# Patient Record
Sex: Female | Born: 1945
Health system: Southern US, Community
[De-identification: ages and names within clinical notes are randomized; demographics above are authoritative.]

## PROBLEM LIST (undated history)

## (undated) DIAGNOSIS — F32A Depression, unspecified: Secondary | ICD-10-CM

## (undated) DIAGNOSIS — I10 Essential (primary) hypertension: Secondary | ICD-10-CM

## (undated) DIAGNOSIS — D496 Neoplasm of unspecified behavior of brain: Secondary | ICD-10-CM

## (undated) DIAGNOSIS — M1990 Unspecified osteoarthritis, unspecified site: Secondary | ICD-10-CM

## (undated) DIAGNOSIS — E785 Hyperlipidemia, unspecified: Secondary | ICD-10-CM

## (undated) DIAGNOSIS — R569 Unspecified convulsions: Secondary | ICD-10-CM

## (undated) DIAGNOSIS — R0600 Dyspnea, unspecified: Secondary | ICD-10-CM

## (undated) HISTORY — DX: Hyperlipidemia, unspecified: E78.5

## (undated) HISTORY — DX: Essential (primary) hypertension: I10

## (undated) HISTORY — PX: OOPHORECTOMY: SHX86

## (undated) HISTORY — DX: Unspecified osteoarthritis, unspecified site: M19.90

## (undated) HISTORY — PX: EYE SURGERY: SHX253

---

## 1980-09-12 HISTORY — PX: APPENDECTOMY: SHX54

## 2019-10-28 ENCOUNTER — Ambulatory Visit (INDEPENDENT_AMBULATORY_CARE_PROVIDER_SITE_OTHER): Payer: Medicare HMO | Admitting: Family Medicine

## 2019-10-28 ENCOUNTER — Other Ambulatory Visit: Payer: Self-pay

## 2019-10-28 ENCOUNTER — Encounter: Payer: Self-pay | Admitting: Family Medicine

## 2019-10-28 VITALS — BP 112/76 | HR 102 | Temp 98.2°F | Ht 63.75 in | Wt 162.8 lb

## 2019-10-28 DIAGNOSIS — M25511 Pain in right shoulder: Secondary | ICD-10-CM

## 2019-10-28 DIAGNOSIS — F329 Major depressive disorder, single episode, unspecified: Secondary | ICD-10-CM | POA: Diagnosis not present

## 2019-10-28 DIAGNOSIS — G8929 Other chronic pain: Secondary | ICD-10-CM | POA: Insufficient documentation

## 2019-10-28 DIAGNOSIS — E785 Hyperlipidemia, unspecified: Secondary | ICD-10-CM | POA: Insufficient documentation

## 2019-10-28 DIAGNOSIS — F325 Major depressive disorder, single episode, in full remission: Secondary | ICD-10-CM | POA: Insufficient documentation

## 2019-10-28 DIAGNOSIS — F32A Depression, unspecified: Secondary | ICD-10-CM

## 2019-10-28 DIAGNOSIS — I10 Essential (primary) hypertension: Secondary | ICD-10-CM

## 2019-10-28 DIAGNOSIS — M25512 Pain in left shoulder: Secondary | ICD-10-CM

## 2019-10-28 MED ORDER — LISINOPRIL-HYDROCHLOROTHIAZIDE 10-12.5 MG PO TABS: 1.0000 | ORAL_TABLET | Freq: Every day | ORAL | 3 refills | Status: DC

## 2019-10-28 MED ORDER — CITALOPRAM HYDROBROMIDE 20 MG PO TABS: 20.0000 mg | ORAL_TABLET | Freq: Every day | ORAL | 3 refills | Status: DC

## 2019-10-28 MED ORDER — MELOXICAM 7.5 MG PO TABS: 7.5000 mg | ORAL_TABLET | Freq: Every day | ORAL | 0 refills | Status: DC

## 2019-10-28 MED ORDER — ATORVASTATIN CALCIUM 20 MG PO TABS: 20.0000 mg | ORAL_TABLET | Freq: Every day | ORAL | 3 refills | Status: DC

## 2019-10-28 NOTE — Progress Notes (Signed)
Subjective:    Patient ID: Marie Hensley, female    DOB: Oct 03, 1945, 74 y.o.   MRN: [id number]  HPI Chief Complaint  Patient presents with  . New Patient (Initial Visit)    Establish Care   This is a 74 yo female who presents today to establish care. She recently moved here from [place], [place]. Originally from [place]. Moved to be closer to her daughter. Retired then went back to work. Enjoys gardening. Has 7 cats.   Last CPE- about 1 year ago Mammo- unsure, many years ago Pap- aged out of screening Colonoscopy- never had Tdap- unsure Flu- annual Eye- 10/16/2019 Dental- regular Exercise- household chores, walking, gardening  Depression- ran out of citalopram, increased crying, more moody. Increased stress with move. Feels that she will be back to baseline once she restarts her medication.   Shoulder and back pain- takes meloxicam. Achy. Also takes acetaminophen prn.   Diarrhea- takes antidiarrheal once a day prn. No blood or mucus in bowel movements.   Review of Systems No chest pain or SOB. No urinary frequency, burning. Occasional stress incontinence.     Objective:   Physical Exam Vitals reviewed. Nursing note reviewed: appears younger than stated age.  Constitutional:      General: She is not in acute distress.    Appearance: Normal appearance. She is normal weight. She is not ill-appearing, toxic-appearing or diaphoretic.  HENT:     Right Ear: External ear normal.     Left Ear: External ear normal.  Cardiovascular:     Rate and Rhythm: Normal rate and regular rhythm.     Heart sounds: Normal heart sounds.  Pulmonary:     Effort: Pulmonary effort is normal.     Breath sounds: Normal breath sounds.  Musculoskeletal:     Right lower leg: No edema.     Left lower leg: No edema.  Skin:    General: Skin is warm and dry.  Neurological:     Mental Status: She is alert and oriented to person, place, and time.  Psychiatric:        Mood and Affect: Mood normal.          Behavior: Behavior normal.        Thought Content: Thought content normal.        Judgment: Judgment normal.       BP 112/76 (BP Location: Left Arm, Patient Position: Sitting, Cuff Size: Normal)   Pulse (!) 102   Temp 98.2 F (36.8 C) (Temporal)   Ht 5' 3.75" (1.619 m)   Wt 162 lb 12.8 oz (73.8 kg)   SpO2 97%   BMI 28.16 kg/m      Assessment & Plan:  1. Essential hypertension - normal BP reading today - lisinopril-hydrochlorothiazide (ZESTORETIC) 10-12.5 MG tablet; Take 1 tablet by mouth daily.  Dispense: 90 tablet; Refill: 3  2. Hyperlipidemia, unspecified hyperlipidemia type - atorvastatin (LIPITOR) 20 MG tablet; Take 1 tablet (20 mg total) by mouth daily.  Dispense: 90 tablet; Refill: 3  3. Chronic pain of both shoulders - meloxicam (MOBIC) 7.5 MG tablet; Take 1 tablet (7.5 mg total) by mouth daily.  Dispense: 90 tablet; Refill: 0  4. Depression, unspecified depression type - discussed restarting her citalopram. If depression symptoms don't improve, she was encouraged to follow up - citalopram (CELEXA) 20 MG tablet; Take 1 tablet (20 mg total) by mouth daily.  Dispense: 90 tablet; Refill: 3  - follow up in 6 months  This visit occurred during  the SARS-CoV-2 public health emergency.  Safety protocols were in place, including screening questions prior to the visit, additional usage of staff PPE, and extensive cleaning of exam room while observing appropriate contact time as indicated for disinfecting solutions.   Clarene Reamer, FNP-BC  [facility]  10/28/2019 12:52 PM

## 2019-10-28 NOTE — Patient Instructions (Addendum)
COVID-19 Vaccine Information can be found at: [email or web address].uk For questions related to vaccine distribution or appointments, please email [email or web address] or call [phone].   Good to see you today  Please follow up in 6 months for labs/ complete physical exam, medicare annual wellness visit with Wellness Nurse

## 2020-02-13 ENCOUNTER — Other Ambulatory Visit: Payer: Self-pay | Admitting: Family Medicine

## 2020-02-13 DIAGNOSIS — M25511 Pain in right shoulder: Secondary | ICD-10-CM

## 2020-02-13 DIAGNOSIS — G8929 Other chronic pain: Secondary | ICD-10-CM

## 2020-02-14 NOTE — Telephone Encounter (Signed)
Last OV and refill 10/28/19 meloxicam (MOBIC) 7.5 MG tablet; Take 1 tablet (7.5 mg total) by mouth daily.  Dispense: 90 tablet; Refill: 0   Upcoming OV 04/27/20

## 2020-03-19 ENCOUNTER — Telehealth: Payer: Self-pay | Admitting: Family Medicine

## 2020-03-19 NOTE — Telephone Encounter (Signed)
Will you please call the patient and find out the following- does she have an ophthalmologist that she wants to be referred to? If not, please find out if she wants to go to [place] or [place]? Who has diagnosed her cataracts and can we get records?

## 2020-03-19 NOTE — Telephone Encounter (Signed)
Pt needs a referral to optometrist to get a cataract removed.

## 2020-03-26 ENCOUNTER — Other Ambulatory Visit: Payer: Self-pay | Admitting: Family Medicine

## 2020-03-26 DIAGNOSIS — H259 Unspecified age-related cataract: Secondary | ICD-10-CM

## 2020-03-26 NOTE — Telephone Encounter (Signed)
LM requesting a call back

## 2020-03-26 NOTE — Telephone Encounter (Signed)
Pt called back. She had a ophthalmologist in [place] who dx cataracts a year ago. She has moved up here since. She is asking for a referral to Dar[facility] Crocker [phone].

## 2020-03-26 NOTE — Telephone Encounter (Signed)
Referral placed.

## 2020-03-30 NOTE — Telephone Encounter (Signed)
Pt aware that the referral has been placed.  Nothing further needed.

## 2020-03-31 ENCOUNTER — Telehealth: Payer: Self-pay

## 2020-03-31 NOTE — Telephone Encounter (Signed)
Noted  

## 2020-03-31 NOTE — Telephone Encounter (Signed)
03-26-20 Dermatology Referral closed.  Pt aware.  Received fax from Dr. Talbert Forest office.  They are unable to accept a referral from PCP for this.  Referral needs to be from [place] spoke w/her insurance, [employer].  They have given her a few optometrist's names to call.  Pt will have her optometrist in [place] refer her.

## 2020-04-17 ENCOUNTER — Other Ambulatory Visit: Payer: Self-pay | Admitting: Family Medicine

## 2020-04-17 DIAGNOSIS — E785 Hyperlipidemia, unspecified: Secondary | ICD-10-CM

## 2020-04-17 DIAGNOSIS — I10 Essential (primary) hypertension: Secondary | ICD-10-CM

## 2020-04-17 DIAGNOSIS — F32A Depression, unspecified: Secondary | ICD-10-CM

## 2020-04-20 ENCOUNTER — Ambulatory Visit: Payer: 59

## 2020-04-20 ENCOUNTER — Other Ambulatory Visit (INDEPENDENT_AMBULATORY_CARE_PROVIDER_SITE_OTHER): Payer: Medicare HMO

## 2020-04-20 ENCOUNTER — Other Ambulatory Visit: Payer: Self-pay

## 2020-04-20 DIAGNOSIS — F329 Major depressive disorder, single episode, unspecified: Secondary | ICD-10-CM | POA: Diagnosis not present

## 2020-04-20 DIAGNOSIS — E785 Hyperlipidemia, unspecified: Secondary | ICD-10-CM

## 2020-04-20 DIAGNOSIS — F32A Depression, unspecified: Secondary | ICD-10-CM

## 2020-04-20 DIAGNOSIS — I10 Essential (primary) hypertension: Secondary | ICD-10-CM | POA: Diagnosis not present

## 2020-04-20 DIAGNOSIS — E559 Vitamin D deficiency, unspecified: Secondary | ICD-10-CM

## 2020-04-20 LAB — COMPREHENSIVE METABOLIC PANEL
ALT: 9 U/L (ref 0–35)
AST: 15 U/L (ref 0–37)
Albumin: 4.4 g/dL (ref 3.5–5.2)
Alkaline Phosphatase: 43 U/L (ref 39–117)
BUN: 42 mg/dL — ABNORMAL HIGH (ref 6–23)
CO2: 22 mEq/L (ref 19–32)
Calcium: 9.5 mg/dL (ref 8.4–10.5)
Chloride: 105 mEq/L (ref 96–112)
Creatinine, Ser: 1.8 mg/dL — ABNORMAL HIGH (ref 0.40–1.20)
GFR: 27.38 mL/min — ABNORMAL LOW (ref >60.00)
Glucose, Bld: 114 mg/dL — ABNORMAL HIGH (ref 70–99)
Potassium: 4 mEq/L (ref 3.5–5.1)
Sodium: 137 mEq/L (ref 135–145)
Total Bilirubin: 0.4 mg/dL (ref 0.2–1.2)
Total Protein: 7.4 g/dL (ref 6.0–8.3)

## 2020-04-20 LAB — CBC WITH DIFFERENTIAL/PLATELET
Basophils Absolute: 0 10*3/uL (ref 0.0–0.1)
Basophils Relative: 0.5 % (ref 0.0–3.0)
Eosinophils Absolute: 0.2 10*3/uL (ref 0.0–0.7)
Eosinophils Relative: 3.6 % (ref 0.0–5.0)
HCT: 36.3 % (ref 36.0–46.0)
Hemoglobin: 12.3 g/dL (ref 12.0–15.0)
Lymphocytes Relative: 43.3 % (ref 12.0–46.0)
Lymphs Abs: 2.6 10*3/uL (ref 0.7–4.0)
MCHC: 34 g/dL (ref 30.0–36.0)
MCV: 96.8 fl (ref 78.0–100.0)
Monocytes Absolute: 0.5 10*3/uL (ref 0.1–1.0)
Monocytes Relative: 8 % (ref 3.0–12.0)
Neutro Abs: 2.7 10*3/uL (ref 1.4–7.7)
Neutrophils Relative %: 44.6 % (ref 43.0–77.0)
Platelets: 203 10*3/uL (ref 150.0–400.0)
RBC: 3.75 Mil/uL — ABNORMAL LOW (ref 3.87–5.11)
RDW: 14.2 % (ref 11.5–15.5)
WBC: 6 10*3/uL (ref 4.0–10.5)

## 2020-04-20 LAB — TSH: TSH: 3.47 u[IU]/mL (ref 0.35–4.50)

## 2020-04-20 LAB — LIPID PANEL
Cholesterol: 233 mg/dL — ABNORMAL HIGH (ref 0–200)
HDL: 54.7 mg/dL (ref >39.00)
Total CHOL/HDL Ratio: 4
Triglycerides: 443 mg/dL — ABNORMAL HIGH (ref 0.0–149.0)

## 2020-04-20 LAB — LDL CHOLESTEROL, DIRECT: Direct LDL: 109 mg/dL

## 2020-04-20 LAB — VITAMIN D 25 HYDROXY (VIT D DEFICIENCY, FRACTURES): VITD: 20.18 ng/mL — ABNORMAL LOW (ref 30.00–100.00)

## 2020-04-21 ENCOUNTER — Ambulatory Visit: Payer: Medicare HMO

## 2020-04-21 ENCOUNTER — Telehealth: Payer: Self-pay

## 2020-04-21 NOTE — Telephone Encounter (Signed)
Called patient to complete her Medicare visit. Patient wanted to wait and complete this with the provider at her upcoming physical. The last time this was done she was charged a $35 fee and she has to talk with Neoma Laming about coding it a certain way to make sure this does not happen again. Appointment cancelled per patient request.

## 2020-04-27 ENCOUNTER — Encounter: Payer: Self-pay | Admitting: Family Medicine

## 2020-04-27 ENCOUNTER — Other Ambulatory Visit: Payer: Self-pay

## 2020-04-27 ENCOUNTER — Ambulatory Visit (INDEPENDENT_AMBULATORY_CARE_PROVIDER_SITE_OTHER): Payer: Medicare HMO | Admitting: Family Medicine

## 2020-04-27 VITALS — BP 110/70 | HR 82 | Temp 96.4°F | Ht 63.75 in | Wt 163.5 lb

## 2020-04-27 DIAGNOSIS — E785 Hyperlipidemia, unspecified: Secondary | ICD-10-CM

## 2020-04-27 DIAGNOSIS — N184 Chronic kidney disease, stage 4 (severe): Secondary | ICD-10-CM | POA: Diagnosis not present

## 2020-04-27 DIAGNOSIS — Z0000 Encounter for general adult medical examination without abnormal findings: Secondary | ICD-10-CM

## 2020-04-27 DIAGNOSIS — I10 Essential (primary) hypertension: Secondary | ICD-10-CM

## 2020-04-27 NOTE — Progress Notes (Signed)
Subjective:    Marie Hensley is a 74 y.o. female who presents for Medicare Annual/Subsequent preventive examination.  Preventive Screening-Counseling & Management  Tobacco Social History   Tobacco Use  Smoking Status Former Smoker   Packs/day: 0.50   Years: 20.00   Pack years: 10.00   Types: Cigarettes   Quit date: 10/27/1989   Years since quitting: 30.5  Smokeless Tobacco Never Used  Tobacco Comment   Smoked off and on in 20 year period     Problems Prior to Visit 1.   Current Problems (verified) Patient Active Problem List   Diagnosis Date Noted   Essential hypertension 10/28/2019   Hyperlipidemia 10/28/2019   Chronic pain of both shoulders 10/28/2019   Depression 10/28/2019    Medications Prior to Visit Current Outpatient Medications on File Prior to Visit  Medication Sig Dispense Refill   atorvastatin (LIPITOR) 20 MG tablet Take 1 tablet (20 mg total) by mouth daily. 90 tablet 3   citalopram (CELEXA) 20 MG tablet Take 1 tablet (20 mg total) by mouth daily. 90 tablet 3   lisinopril-hydrochlorothiazide (ZESTORETIC) 10-12.5 MG tablet Take 1 tablet by mouth daily. 90 tablet 3   meloxicam (MOBIC) 7.5 MG tablet TAKE ONE TABLET BY MOUTH DAILY 90 tablet 0   No current facility-administered medications on file prior to visit.    Current Medications (verified) Current Outpatient Medications  Medication Sig Dispense Refill   atorvastatin (LIPITOR) 20 MG tablet Take 1 tablet (20 mg total) by mouth daily. 90 tablet 3   citalopram (CELEXA) 20 MG tablet Take 1 tablet (20 mg total) by mouth daily. 90 tablet 3   lisinopril-hydrochlorothiazide (ZESTORETIC) 10-12.5 MG tablet Take 1 tablet by mouth daily. 90 tablet 3   meloxicam (MOBIC) 7.5 MG tablet TAKE ONE TABLET BY MOUTH DAILY 90 tablet 0   No current facility-administered medications for this visit.     Allergies (verified) Patient has no known allergies.   PAST HISTORY  Family History Family  History  Problem Relation Age of Onset   Addison's disease Daughter     Social History Social History   Tobacco Use   Smoking status: Former Smoker    Packs/day: 0.50    Years: 20.00    Pack years: 10.00    Types: Cigarettes    Quit date: 10/27/1989    Years since quitting: 30.5   Smokeless tobacco: Never Used   Tobacco comment: Smoked off and on in 20 year period  Substance Use Topics   Alcohol use: Yes    Comment: social     Are there smokers in your home (other than you)? No  Risk Factors Current exercise habits: Gym/ health club routine includes low impact aerobics.  Dietary issues discussed: healthy choices, adequate water intake   Cardiac risk factors: advanced age (older than 37 for men, 44 for women), dyslipidemia and hypertension.  Depression Screen (Note: if answer to either of the following is "Yes", a more complete depression screening is indicated)   Over the past two weeks, have you felt down, depressed or hopeless? No  Over the past two weeks, have you felt little interest or pleasure in doing things? No  Have you lost interest or pleasure in daily life? No  Do you often feel hopeless? No  Do you cry easily over simple problems? No  Activities of Daily Living In your present state of health, do you have any difficulty performing the following activities?:  Driving? No Managing money?  No Feeding yourself? No  Getting from bed to chair? NoGeneral appearance: alert, cooperative and appears stated age Ears: normal TM's and external ear canals both ears Nose: Nares normal. Septum midline. Mucosa normal. No drainage or sinus tenderness. Throat: lips, mucosa, and tongue normal; teeth and gums normal Neck: no adenopathy, no carotid bruit, no JVD, supple, symmetrical, trachea midline and thyroid not enlarged, symmetric, no tenderness/mass/nodules Lungs: clear to auscultation bilaterally Breasts: normal appearance, no masses or tenderness Abdomen: soft,  non-tender; bowel sounds normal; no masses,  no organomegaly Extremities: extremities normal, atraumatic, no cyanosis or edema Skin: Skin color, texture, turgor normal. No rashes or lesions Climbing a flight of stairs? No Preparing food and eating?: No Bathing or showering? No Getting dressed: No Getting to the toilet? No Using the toilet:No Moving around from place to place: No In the past year have you fallen or had a near fall?:No   Are you sexually active?  No  Do you have more than one partner?  No  Hearing Difficulties: Yes Do you often ask people to speak up or repeat themselves? Yes Do you experience ringing or noises in your ears? No Do you have difficulty understanding soft or whispered voices? Yes   Do you feel that you have a problem with memory? No  Do you often misplace items? Yes  Do you feel safe at home?  Yes  Cognitive Testing  Alert? Yes  Normal Appearance?Yes  Oriented to person? Yes  Place? Yes   Time? Yes  Recall of three objects?  Yes  Can perform simple calculations? Yes  Displays appropriate judgment?Yes  Can read the correct time from a watch face?Yes   Advanced Directives have been discussed with the patient? Yes  List the Names of Other Physician/Practitioners you currently use: 1.  none  Indicate any recent Medical Services you may have received from other than [facility] providers in the past year (date may be approximate).  Immunization History  Administered Date(s) Administered   Influenza,inj,Quad PF,6+ Mos 07/14/2019   Pneumococcal Conjugate-13 11/12/2015   Pneumococcal Polysaccharide-23 07/20/2017    Screening Tests Health Maintenance  Topic Date Due   Hepatitis C Screening  Never done   COVID-19 Vaccine (1) Never done   TETANUS/TDAP  Never done   COLONOSCOPY  Never done   DEXA SCAN  Never done   INFLUENZA VACCINE  04/12/2020   PNA vac Low Risk Adult  Completed    All answers were reviewed with the patient and necessary  referrals were made:  Elby Beck, FNP   04/27/2020   History reviewed: allergies, current medications, past family history, past medical history, past social history, past surgical history and problem list  Review of Systems A comprehensive review of systems was negative except for: Musculoskeletal: positive for arthralgias and myalgias    Objective:      Body mass index is 28.29 kg/m. BP 110/70    Pulse 82    Temp (!) 96.4 F (35.8 C) (Temporal)    Ht 5' 3.75" (1.619 m)    Wt 163 lb 8 oz (74.2 kg)    SpO2 98%    BMI 28.29 kg/m   Wt Readings from Last 3 Encounters:  04/27/20 163 lb 8 oz (74.2 kg)  10/28/19 162 lb 12.8 oz (73.8 kg)     Hearing Screening   125Hz  250Hz  500Hz  1000Hz  2000Hz  3000Hz  4000Hz  6000Hz  8000Hz   Right ear:   40 40 40  40    Left ear:   40 40 40  40  Visual Acuity Screening   Right eye Left eye Both eyes  Without correction:     With correction: 20/40 20/40 20/25     Physical Exam  Constitutional: She is oriented to person, place, and time. She appears well-developed and well-nourished. No distress.  HENT:  Head: Normocephalic and atraumatic.  Right Ear: External ear normal. TM normal.  Left Ear: External ear normal. TM normal.  Nose: Nose normal.  Mouth/Throat: Oropharynx is clear and moist. No oropharyngeal exudate.  Eyes: Conjunctivae are normal.   Neck: Normal range of motion. Neck supple. No JVD present. No thyromegaly present.  Cardiovascular: Normal rate, regular rhythm, normal heart sounds and intact distal pulses.   Pulmonary/Chest: Effort normal and breath sounds normal. Right breast exhibits no inverted nipple, no mass, no nipple discharge, no skin change and no tenderness. Left breast exhibits no inverted nipple, no mass, no nipple discharge, no skin change and no tenderness. Breasts are symmetrical.  Abdominal: Soft. Bowel sounds are normal. She exhibits no distension and no mass. There is no tenderness. There is no rebound and no  guarding.  Musculoskeletal: Normal range of motion. She exhibits no edema or tenderness.  Lymphadenopathy:    She has no cervical adenopathy.  Neurological: She is alert and oriented to person, place, and time.   Skin: Skin is warm and dry. She is not diaphoretic.  Psychiatric: She has a normal mood and affect. Her behavior is normal. Judgment and thought content normal.  Vitals reviewed.       Assessment:  1. Medicare annual wellness visit, subsequent  2. Essential hypertension BP: 110/70  -Well-controlled on lisinopril hydrochlorothiazide  3. Hyperlipidemia, unspecified hyperlipidemia type -Not well controlled, currently on atorvastatin 20 mg, will have her increase to 40 mg.  And recheck in 6 months  4. Chronic kidney disease, stage 4 (severe) (HCC) -Per patient, she was advised to avoid NSAIDs in the past and was told that her kidney function was decreased and she has seen nephrologist.  I have no records of this.  She has been taking meloxicam 7.5 mg daily for many months now.  Discussed importance of stopping meloxicam, avoiding all other NSAIDs, can use Tylenol for pain.  Increase water.  Recheck labs in 2 weeks.  This visit occurred during the SARS-CoV-2 public health emergency.  Safety protocols were in place, including screening questions prior to the visit, additional usage of staff PPE, and extensive cleaning of exam room while observing appropriate contact time as indicated for disinfecting solutions.      Clarene Reamer, FNP-BC  [facility] [place]  04/28/2020 9:56 AM         Plan:     During the course of the visit the patient was educated and counseled about appropriate screening and preventive services including:    Advanced directives: has NO advanced directive  - add't info requested. Referral to SW: She was provided advanced care planning booklet/ documents, encouraged to complete and return copy to  office       Diet review for nutrition referral? Yes ____  Not Indicated __x__   Patient Instructions (the written plan) was given to the patient.  Medicare Attestation I have personally reviewed: The patient's medical and social history Their use of alcohol, tobacco or illicit drugs Their current medications and supplements The patient's functional ability including ADLs,fall risks, home safety risks, cognitive, and hearing and visual impairment Diet and physical activities Evidence for depression or mood disorders  The  patient's weight, height, BMI, and visual acuity have been recorded in the chart.  I have made referrals, counseling, and provided education to the patient based on review of the above and I have provided the patient with a written personalized care plan for preventive services.     Elby Beck, FNP   04/27/2020

## 2020-04-27 NOTE — Patient Instructions (Addendum)
Vitamin D is low, please start vitamin D3 1,000 IU daily  For assistance with mychart- [phone]  Stop meloxicam, schedule a lab only visit in 2 weeks. Increase your water intake by at least 16 ounces a day  For joint pain- can take acetaminophen (Tylenol) twice a day every 6-8 hours (maximum 4,000 mg in 24 hours)  Follow up lab only visit in 2 weeks. Follow up in office in 6 months.   Get your flu vaccine when it is available   Ms. Marie Hensley , Thank you for taking time to come for your Medicare Wellness Visit. I appreciate your ongoing commitment to your health goals. Please review the following plan we discussed and let me know if I can assist you in the future.   These are the goals we discussed: Goals   Continue silver sneakers, add stretching, increase your water intake     This is a list of the screening recommended for you and due dates:  Health Maintenance  Topic Date Due  .  Hepatitis C: One time screening is recommended by Center for Disease Control  (CDC) for  adults born from [birth year] through 1967.   Never done  . COVID-19 Vaccine (1) Never done  . Tetanus Vaccine  Never done  . Colon Cancer Screening  Never done  . DEXA scan (bone density measurement)  Never done  . Flu Shot  04/12/2020  . Pneumonia vaccines  Completed

## 2020-04-28 ENCOUNTER — Telehealth: Payer: Self-pay | Admitting: Family Medicine

## 2020-04-28 ENCOUNTER — Encounter: Payer: Self-pay | Admitting: Family Medicine

## 2020-04-28 DIAGNOSIS — N184 Chronic kidney disease, stage 4 (severe): Secondary | ICD-10-CM | POA: Insufficient documentation

## 2020-04-28 MED ORDER — ATORVASTATIN CALCIUM 40 MG PO TABS: 40.0000 mg | ORAL_TABLET | Freq: Every day | ORAL | 3 refills | Status: DC

## 2020-04-28 NOTE — Telephone Encounter (Signed)
Please call patient and tell her that I forgot to discuss her elevated cholesterol in the office yesterday.  I would like her to increase her atorvastatin to 40 mg.  She can take 2 of the 20 mg at 1 time until she is out.  I have sent in a refill at the higher dose, atorvastatin 40 mg for her to start when she is out of the 20 mg tablets.  I would also like her to watch her sugars and carbs as we discussed.

## 2020-04-30 NOTE — Telephone Encounter (Signed)
Pt notified as instructed by telephone. Pt states understanding.

## 2020-04-30 NOTE — Telephone Encounter (Signed)
Left message for patient to return call to office. 

## 2020-05-11 ENCOUNTER — Other Ambulatory Visit: Payer: Self-pay

## 2020-05-11 ENCOUNTER — Other Ambulatory Visit: Payer: Self-pay | Admitting: Family Medicine

## 2020-05-11 ENCOUNTER — Other Ambulatory Visit (INDEPENDENT_AMBULATORY_CARE_PROVIDER_SITE_OTHER): Payer: Medicare HMO

## 2020-05-11 DIAGNOSIS — R944 Abnormal results of kidney function studies: Secondary | ICD-10-CM | POA: Diagnosis not present

## 2020-05-11 LAB — BASIC METABOLIC PANEL
BUN: 32 mg/dL — ABNORMAL HIGH (ref 6–23)
CO2: 21 mEq/L (ref 19–32)
Calcium: 9.6 mg/dL (ref 8.4–10.5)
Chloride: 106 mEq/L (ref 96–112)
Creatinine, Ser: 1.41 mg/dL — ABNORMAL HIGH (ref 0.40–1.20)
GFR: 36.29 mL/min — ABNORMAL LOW (ref >60.00)
Glucose, Bld: 129 mg/dL — ABNORMAL HIGH (ref 70–99)
Potassium: 3.8 mEq/L (ref 3.5–5.1)
Sodium: 138 mEq/L (ref 135–145)

## 2020-05-29 ENCOUNTER — Ambulatory Visit (INDEPENDENT_AMBULATORY_CARE_PROVIDER_SITE_OTHER): Payer: Medicare HMO | Admitting: Family Medicine

## 2020-05-29 ENCOUNTER — Other Ambulatory Visit: Payer: Self-pay

## 2020-05-29 ENCOUNTER — Encounter: Payer: Self-pay | Admitting: Family Medicine

## 2020-05-29 VITALS — BP 118/68 | HR 88 | Wt 166.0 lb

## 2020-05-29 DIAGNOSIS — M5441 Lumbago with sciatica, right side: Secondary | ICD-10-CM

## 2020-05-29 DIAGNOSIS — Z23 Encounter for immunization: Secondary | ICD-10-CM

## 2020-05-29 DIAGNOSIS — G8929 Other chronic pain: Secondary | ICD-10-CM | POA: Diagnosis not present

## 2020-05-29 MED ORDER — TRAMADOL HCL 50 MG PO TABS: 50.0000 mg | ORAL_TABLET | Freq: Three times a day (TID) | ORAL | 0 refills | Status: AC | PRN

## 2020-05-29 NOTE — Patient Instructions (Signed)
Good to see you today  Please see if your insurance will cover Chiropractor visits  Work in stretching and strengthening, can continue Tylenol 2 tablets every 8-12 hours, can try topical creams/ patches, heat/ ice  Follow up in 6 weeks for repeat blood work Back Exercises These exercises help to make your trunk and back strong. They also help to keep the lower back flexible. Doing these exercises can help to prevent back pain or lessen existing pain.  If you have back pain, try to do these exercises 2-3 times each day or as told by your doctor.  As you get better, do the exercises once each day. Repeat the exercises more often as told by your doctor.  To stop back pain from coming back, do the exercises once each day, or as told by your doctor. Exercises Single knee to chest Do these steps 3-5 times in a row for each leg: 1. Lie on your back on a firm bed or the floor with your legs stretched out. 2. Bring one knee to your chest. 3. Grab your knee or thigh with both hands and hold them it in place. 4. Pull on your knee until you feel a gentle stretch in your lower back or buttocks. 5. Keep doing the stretch for 10-30 seconds. 6. Slowly let go of your leg and straighten it. Pelvic tilt Do these steps 5-10 times in a row: 1. Lie on your back on a firm bed or the floor with your legs stretched out. 2. Bend your knees so they point up to the ceiling. Your feet should be flat on the floor. 3. Tighten your lower belly (abdomen) muscles to press your lower back against the floor. This will make your tailbone point up to the ceiling instead of pointing down to your feet or the floor. 4. Stay in this position for 5-10 seconds while you gently tighten your muscles and breathe evenly. Cat-cow Do these steps until your lower back bends more easily: 1. Get on your hands and knees on a firm surface. Keep your hands under your shoulders, and keep your knees under your hips. You may put padding under  your knees. 2. Let your head hang down toward your chest. Tighten (contract) the muscles in your belly. Point your tailbone toward the floor so your lower back becomes rounded like the back of a cat. 3. Stay in this position for 5 seconds. 4. Slowly lift your head. Let the muscles of your belly relax. Point your tailbone up toward the ceiling so your back forms a sagging arch like the back of a cow. 5. Stay in this position for 5 seconds.  Press-ups Do these steps 5-10 times in a row: 1. Lie on your belly (face-down) on the floor. 2. Place your hands near your head, about shoulder-width apart. 3. While you keep your back relaxed and keep your hips on the floor, slowly straighten your arms to raise the top half of your body and lift your shoulders. Do not use your back muscles. You may change where you place your hands in order to make yourself more comfortable. 4. Stay in this position for 5 seconds. 5. Slowly return to lying flat on the floor.  Bridges Do these steps 10 times in a row: 1. Lie on your back on a firm surface. 2. Bend your knees so they point up to the ceiling. Your feet should be flat on the floor. Your arms should be flat at your sides, next to  your body. 3. Tighten your butt muscles and lift your butt off the floor until your waist is almost as high as your knees. If you do not feel the muscles working in your butt and the back of your thighs, slide your feet 1-2 inches farther away from your butt. 4. Stay in this position for 3-5 seconds. 5. Slowly lower your butt to the floor, and let your butt muscles relax. If this exercise is too easy, try doing it with your arms crossed over your chest. Belly crunches Do these steps 5-10 times in a row: 1. Lie on your back on a firm bed or the floor with your legs stretched out. 2. Bend your knees so they point up to the ceiling. Your feet should be flat on the floor. 3. Cross your arms over your chest. 4. Tip your chin a little bit  toward your chest but do not bend your neck. 5. Tighten your belly muscles and slowly raise your chest just enough to lift your shoulder blades a tiny bit off of the floor. Avoid raising your body higher than that, because it can put too much stress on your low back. 6. Slowly lower your chest and your head to the floor. Back lifts Do these steps 5-10 times in a row: 1. Lie on your belly (face-down) with your arms at your sides, and rest your forehead on the floor. 2. Tighten the muscles in your legs and your butt. 3. Slowly lift your chest off of the floor while you keep your hips on the floor. Keep the back of your head in line with the curve in your back. Look at the floor while you do this. 4. Stay in this position for 3-5 seconds. 5. Slowly lower your chest and your face to the floor. Contact a doctor if:  Your back pain gets a lot worse when you do an exercise.  Your back pain does not get better 2 hours after you exercise. If you have any of these problems, stop doing the exercises. Do not do them again unless your doctor says it is okay. Get help right away if:  You have sudden, very bad back pain. If this happens, stop doing the exercises. Do not do them again unless your doctor says it is okay. This information is not intended to replace advice given to you by your health care provider. Make sure you discuss any questions you have with your health care provider. Document Revised: 05/24/2018 Document Reviewed: 05/24/2018 Elsevier Patient Education  2020 [employer].

## 2020-05-29 NOTE — Progress Notes (Signed)
Subjective:    Patient ID: Marie Hensley, female    DOB: 02/12/1946, 74 y.o.   MRN: [id number]  HPI Chief Complaint  Patient presents with  . Back Pain   This is a 74 yo female who presents today with worsening back pain. Was taken off of meloxicam due to worsening renal function. Previously was seen by DO and had manipulations which helped. Had 3-4 days of right sided flank pain which was nagging, improved. No history of kidney stones. Some PT many years ago. Doesn't think it was helpful. No regular exercise. Was going to [employer] but difficulty going to gym with pandemic. Morning pain and stiffness, better after getting up and moving around then more pain at end of day. Sleeping ok. No UE/LE weakness. Intermittent radiation into left buttock. No bowel or bladder changes. Pain is achy. Taking two Arthritis Tylenol 2-3 times a day with minimal relief. [place] with temporary relief. Has used Tramadol in past with good relief.   Skin- had a red place on left forearm, now is almost resolved. Happened 10 days ago. Not sure if irritation.    Review of Systems Per HPI    Objective:   Physical Exam Vitals reviewed.  Constitutional:      General: She is not in acute distress.    Appearance: Normal appearance. She is normal weight. She is not ill-appearing, toxic-appearing or diaphoretic.  HENT:     Head: Normocephalic and atraumatic.  Eyes:     Conjunctiva/sclera: Conjunctivae normal.  Cardiovascular:     Rate and Rhythm: Normal rate and regular rhythm.     Heart sounds: Normal heart sounds.  Pulmonary:     Breath sounds: Normal breath sounds.  Musculoskeletal:     Cervical back: Normal.     Thoracic back: Normal.     Lumbar back: Tenderness (paraspinals bilateraly) present. No bony tenderness. Normal range of motion. Negative right straight leg raise test and negative left straight leg raise test.     Right lower leg: No edema.     Left lower leg: No edema.   Skin:    General: Skin is warm and dry.  Neurological:     Mental Status: She is alert and oriented to person, place, and time.  Psychiatric:        Mood and Affect: Mood normal.        Behavior: Behavior normal.        Thought Content: Thought content normal.        Judgment: Judgment normal.      BP 118/68 (BP Location: Right Arm, Patient Position: Sitting, Cuff Size: Normal)   Pulse 88   Wt 166 lb (75.3 kg)   SpO2 96%   BMI 28.72 kg/m  Wt Readings from Last 3 Encounters:  05/29/20 166 lb (75.3 kg)  04/27/20 163 lb 8 oz (74.2 kg)  10/28/19 162 lb 12.8 oz (73.8 kg)        Assessment & Plan:  1. Chronic right-sided low back pain with right-sided sciatica - discussed chronic nature of low back pain, importance of avoiding NSAIDs with her elevated creatinine. Continue acetaminophen, topical ointments, heat.  - Provided exercises to strengthen core - She decline PT referral, discussed calling her insurance company to see if chiropractor covered - small amount of tramadol provided for severe acute pain - traMADol (ULTRAM) 50 MG tablet; Take 1 tablet (50 mg total) by mouth every 8 (eight) hours as needed for up to 5 days.  Dispense: 15 tablet; Refill: 0  2. Need for influenza vaccination - Flu Vaccine QUAD High Dose(Fluad)  This visit occurred during the SARS-CoV-2 public health emergency.  Safety protocols were in place, including screening questions prior to the visit, additional usage of staff PPE, and extensive cleaning of exam room while observing appropriate contact time as indicated for disinfecting solutions.    Clarene Reamer, FNP-BC   Primary Care at [facility], [facility]  06/01/2020 7:03 AM

## 2020-06-01 ENCOUNTER — Encounter: Payer: Self-pay | Admitting: Family Medicine

## 2020-06-28 ENCOUNTER — Encounter: Payer: Self-pay | Admitting: Family Medicine

## 2020-06-28 ENCOUNTER — Other Ambulatory Visit: Payer: Self-pay | Admitting: Family Medicine

## 2020-06-28 DIAGNOSIS — R944 Abnormal results of kidney function studies: Secondary | ICD-10-CM

## 2020-06-28 DIAGNOSIS — Z1159 Encounter for screening for other viral diseases: Secondary | ICD-10-CM

## 2020-06-28 DIAGNOSIS — I10 Essential (primary) hypertension: Secondary | ICD-10-CM

## 2020-07-10 ENCOUNTER — Other Ambulatory Visit (INDEPENDENT_AMBULATORY_CARE_PROVIDER_SITE_OTHER): Payer: Medicare HMO

## 2020-07-10 ENCOUNTER — Encounter: Payer: Self-pay | Admitting: Family Medicine

## 2020-07-10 ENCOUNTER — Other Ambulatory Visit: Payer: Self-pay

## 2020-07-10 DIAGNOSIS — I10 Essential (primary) hypertension: Secondary | ICD-10-CM | POA: Diagnosis not present

## 2020-07-10 DIAGNOSIS — R944 Abnormal results of kidney function studies: Secondary | ICD-10-CM

## 2020-07-10 DIAGNOSIS — Z1159 Encounter for screening for other viral diseases: Secondary | ICD-10-CM

## 2020-07-10 LAB — BASIC METABOLIC PANEL
BUN: 27 mg/dL — ABNORMAL HIGH (ref 6–23)
CO2: 26 mEq/L (ref 19–32)
Calcium: 9.3 mg/dL (ref 8.4–10.5)
Chloride: 103 mEq/L (ref 96–112)
Creatinine, Ser: 1.33 mg/dL — ABNORMAL HIGH (ref 0.40–1.20)
GFR: 39.05 mL/min — ABNORMAL LOW (ref >60.00)
Glucose, Bld: 103 mg/dL — ABNORMAL HIGH (ref 70–99)
Potassium: 4.1 mEq/L (ref 3.5–5.1)
Sodium: 137 mEq/L (ref 135–145)

## 2020-07-13 LAB — HEPATITIS C ANTIBODY
Hepatitis C Ab: NONREACTIVE
SIGNAL TO CUT-OFF: 0.02 (ref <1.00)

## 2020-07-14 NOTE — Telephone Encounter (Signed)
Information regarding recheck of lipids sent in result note.

## 2020-08-03 ENCOUNTER — Other Ambulatory Visit: Payer: Self-pay | Admitting: Family Medicine

## 2020-08-03 DIAGNOSIS — F32A Depression, unspecified: Secondary | ICD-10-CM

## 2020-09-15 DIAGNOSIS — Z0100 Encounter for examination of eyes and vision without abnormal findings: Secondary | ICD-10-CM | POA: Diagnosis not present

## 2020-10-01 ENCOUNTER — Encounter: Payer: Medicare HMO | Admitting: Family Medicine

## 2020-11-19 ENCOUNTER — Encounter: Payer: Self-pay | Admitting: Family Medicine

## 2020-11-19 ENCOUNTER — Ambulatory Visit (INDEPENDENT_AMBULATORY_CARE_PROVIDER_SITE_OTHER)
Admission: RE | Admit: 2020-11-19 | Discharge: 2020-11-19 | Disposition: A | Discharge status: Home / Self Care | Payer: Medicare HMO | Source: Ambulatory Visit | Attending: Family Medicine | Admitting: Family Medicine

## 2020-11-19 ENCOUNTER — Other Ambulatory Visit: Payer: Self-pay

## 2020-11-19 ENCOUNTER — Ambulatory Visit (INDEPENDENT_AMBULATORY_CARE_PROVIDER_SITE_OTHER): Payer: Medicare HMO | Admitting: Family Medicine

## 2020-11-19 VITALS — BP 102/60 | HR 73 | Temp 97.0°F | Ht 64.0 in | Wt 173.0 lb

## 2020-11-19 DIAGNOSIS — E785 Hyperlipidemia, unspecified: Secondary | ICD-10-CM

## 2020-11-19 DIAGNOSIS — I10 Essential (primary) hypertension: Secondary | ICD-10-CM

## 2020-11-19 DIAGNOSIS — R0609 Other forms of dyspnea: Secondary | ICD-10-CM

## 2020-11-19 DIAGNOSIS — R911 Solitary pulmonary nodule: Secondary | ICD-10-CM | POA: Diagnosis not present

## 2020-11-19 DIAGNOSIS — J80 Acute respiratory distress syndrome: Secondary | ICD-10-CM | POA: Diagnosis not present

## 2020-11-19 DIAGNOSIS — R69 Illness, unspecified: Secondary | ICD-10-CM | POA: Diagnosis not present

## 2020-11-19 DIAGNOSIS — R0600 Dyspnea, unspecified: Secondary | ICD-10-CM

## 2020-11-19 DIAGNOSIS — F325 Major depressive disorder, single episode, in full remission: Secondary | ICD-10-CM | POA: Diagnosis not present

## 2020-11-19 LAB — CBC
HCT: 37.5 % (ref 36.0–46.0)
Hemoglobin: 12.8 g/dL (ref 12.0–15.0)
MCHC: 34 g/dL (ref 30.0–36.0)
MCV: 96.2 fl (ref 78.0–100.0)
Platelets: 203 10*3/uL (ref 150.0–400.0)
RBC: 3.9 Mil/uL (ref 3.87–5.11)
RDW: 13.2 % (ref 11.5–15.5)
WBC: 7.2 10*3/uL (ref 4.0–10.5)

## 2020-11-19 LAB — LIPID PANEL
Cholesterol: 206 mg/dL — ABNORMAL HIGH (ref 0–200)
HDL: 69.9 mg/dL (ref >39.00)
LDL Cholesterol: 101 mg/dL — ABNORMAL HIGH (ref 0–99)
NonHDL: 135.65
Total CHOL/HDL Ratio: 3
Triglycerides: 174 mg/dL — ABNORMAL HIGH (ref 0.0–149.0)
VLDL: 34.8 mg/dL (ref 0.0–40.0)

## 2020-11-19 LAB — COMPREHENSIVE METABOLIC PANEL
ALT: 19 U/L (ref 0–35)
AST: 23 U/L (ref 0–37)
Albumin: 4.3 g/dL (ref 3.5–5.2)
Alkaline Phosphatase: 48 U/L (ref 39–117)
BUN: 29 mg/dL — ABNORMAL HIGH (ref 6–23)
CO2: 28 mEq/L (ref 19–32)
Calcium: 9.8 mg/dL (ref 8.4–10.5)
Chloride: 103 mEq/L (ref 96–112)
Creatinine, Ser: 1.37 mg/dL — ABNORMAL HIGH (ref 0.40–1.20)
GFR: 37.59 mL/min — ABNORMAL LOW (ref >60.00)
Glucose, Bld: 124 mg/dL — ABNORMAL HIGH (ref 70–99)
Potassium: 4.3 mEq/L (ref 3.5–5.1)
Sodium: 138 mEq/L (ref 135–145)
Total Bilirubin: 0.6 mg/dL (ref 0.2–1.2)
Total Protein: 7.7 g/dL (ref 6.0–8.3)

## 2020-11-19 LAB — BRAIN NATRIURETIC PEPTIDE: Pro B Natriuretic peptide (BNP): 17 pg/mL (ref 0.0–100.0)

## 2020-11-19 IMAGING — DX DG CHEST 2V
2 series · 2 of 2 positions shown · non-contrast
Comparison: None.

CLINICAL DATA: Dyspnea on exertion

EXAM:
CHEST - 2 VIEW

[chest pa]
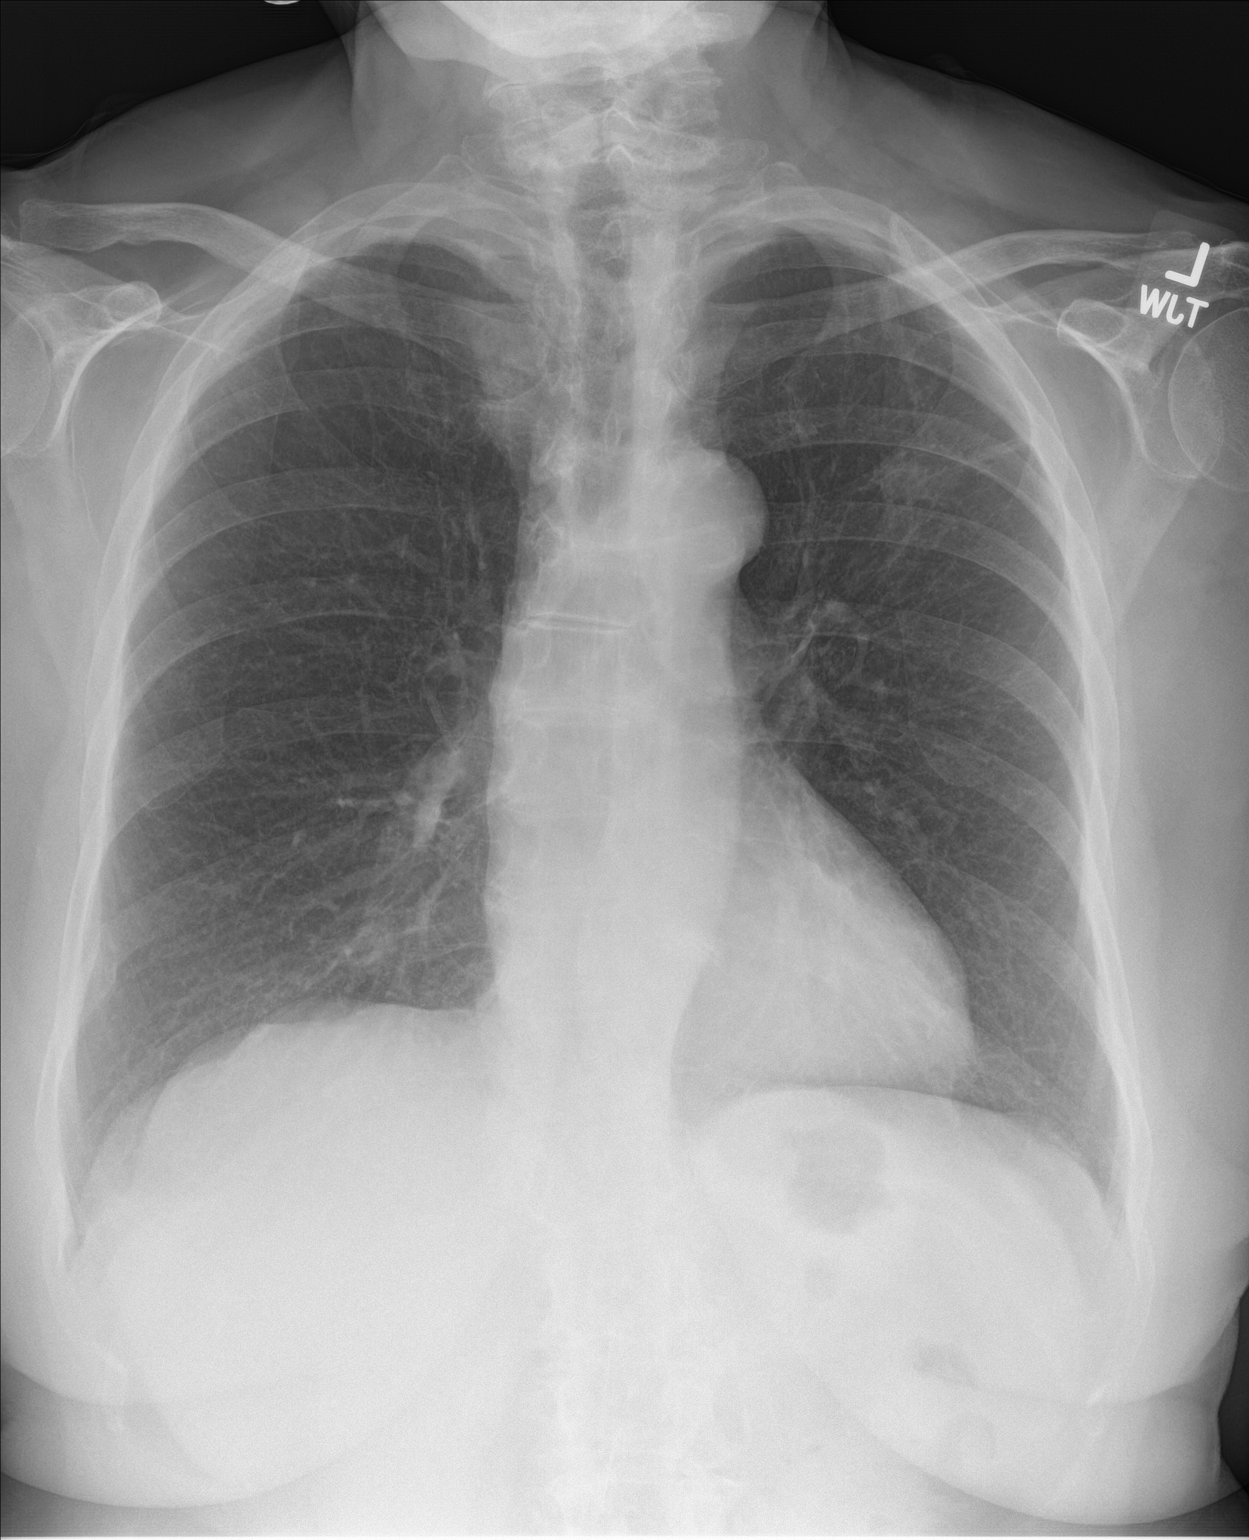

[chest lat]
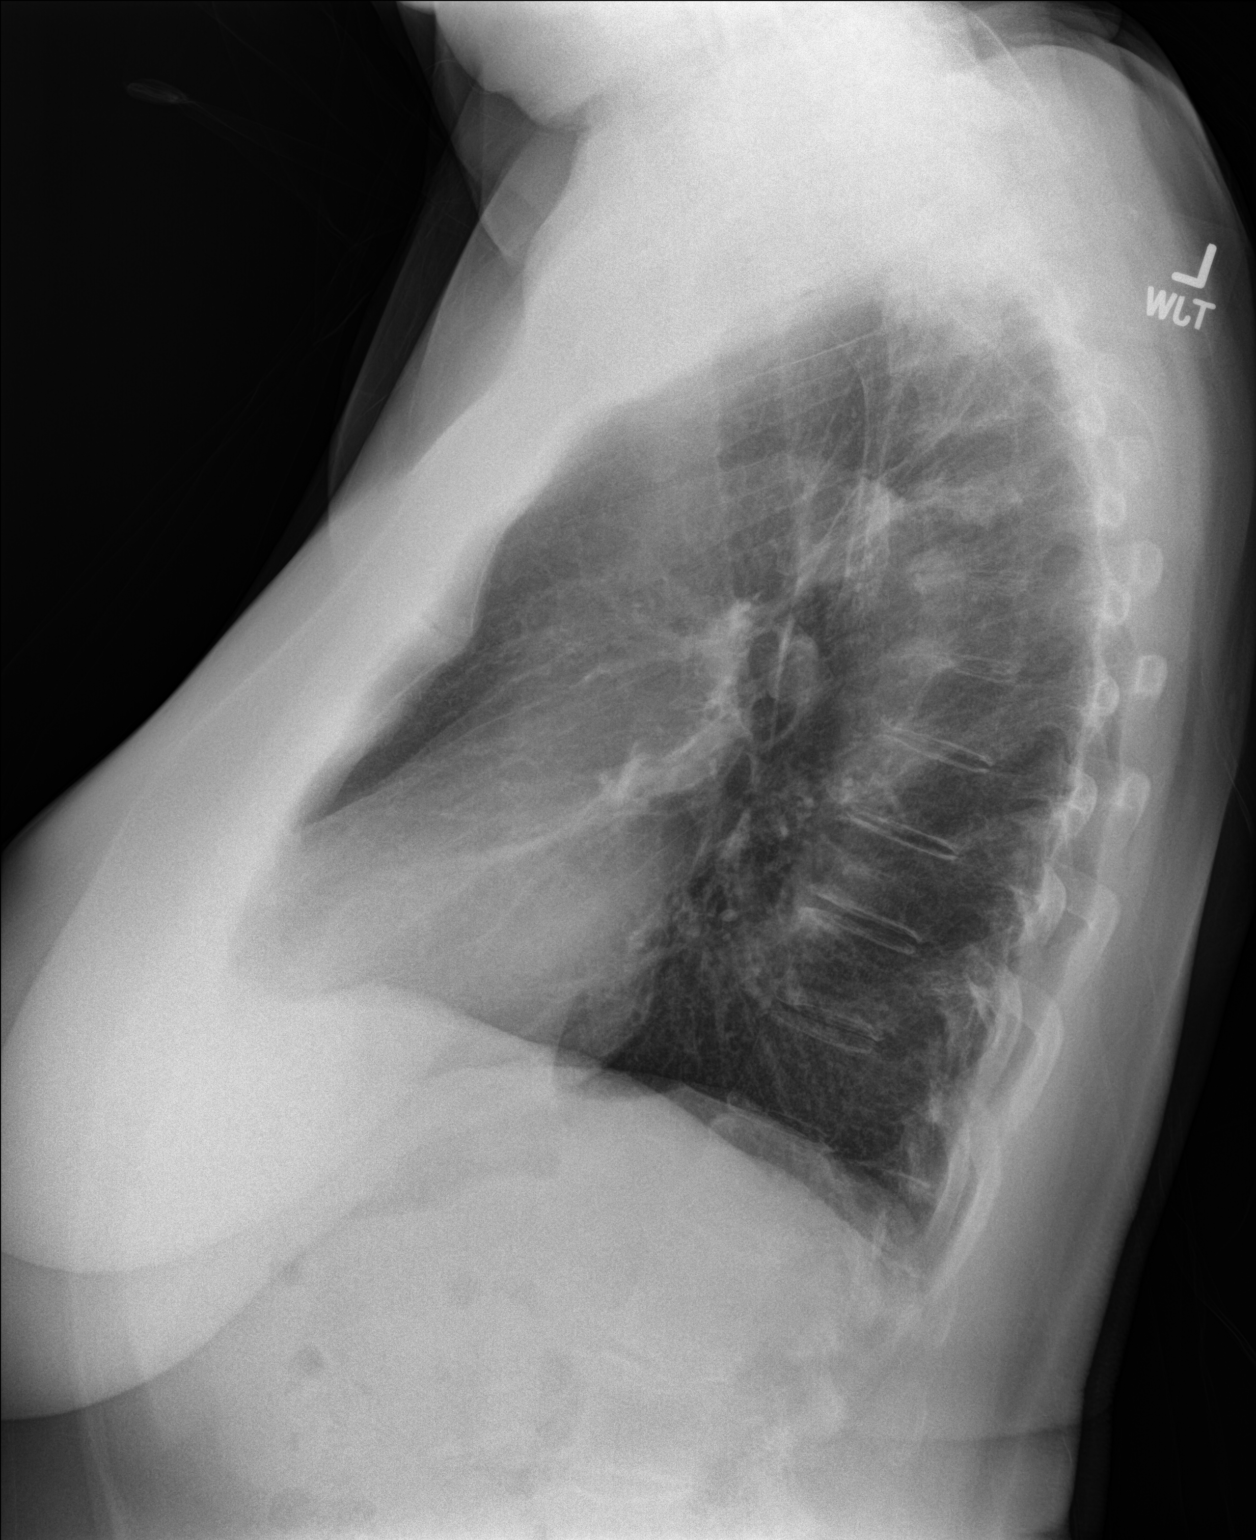

[2 of 2 positions shown; findings below may reference images not displayed]

FINDINGS: Spiculated nodular appearance over the left upper lobe with bands
extending towards the pleura. The nodular area measures 19 mm.

Normal heart size and mediastinal contours. Fine reticular density
on both sides.

No effusion or pneumothorax.

No acute osseous findings.

These results will be called to the ordering clinician or
representative by the Radiologist Assistant, and communication
documented in the PACS or [REDACTED].
IMPRESSION: Worrisome 19 mm left upper lobe nodule, recommend chest CT with
contrast to evaluate for malignancy.

## 2020-11-19 MED ORDER — LISINOPRIL-HYDROCHLOROTHIAZIDE 10-12.5 MG PO TABS: 1.0000 | ORAL_TABLET | Freq: Every day | ORAL | 3 refills | Status: DC

## 2020-11-19 NOTE — Assessment & Plan Note (Signed)
BP controlled on lisinopril-hctz 10-12.5

## 2020-11-19 NOTE — Assessment & Plan Note (Signed)
Lab Results  Component Value Date   CHOL 233 (H) 04/20/2020   HDL 54.70 \ 04/20/2020   LDLDIRECT 109.0 04/20/2020   TRIG (H) 04/20/2020    443.0 Triglyceride is over 400; calculations on Lipids are invalid.   CHOLHDL 4 04/20/2020   On atrovastatin 40 mg.

## 2020-11-19 NOTE — Assessment & Plan Note (Signed)
Doing well on citalopram 20 mg

## 2020-11-19 NOTE — Assessment & Plan Note (Signed)
Concerning for CHF in setting of elevated JVD but also with murmur on exam so could be aortic stenosis. Labs today if BNP elevated will trial lasix. CXR w/o obvious effusion (f/u final read) and legs without edema. ECHO to evaluate murmur and heart function.

## 2020-11-19 NOTE — Progress Notes (Signed)
Subjective:     Marie Hensley is a 75 y.o. female presenting for Transitions Of Care, Tremors (Gotten worse over last 6 months ), Fatigue (Just walking to mailbox x 1-2 months ), and Shortness of Breath (Upon exertion x 1-2 months. O2 at rest 98%, after 2 laps dropped to 94%)     HPI  #DOE - worse over 2 months - no cp - no breathing difficulty laying flat - legs feel heavy - no PND - no wheezing - no sob at rest - improves with rest - no know hx of heart failure or lung disease - quit smoking in 1991  #Tremors - present for a long time - will feel shaky inside - but primarily in the hands - worse holding something or writing or picking something up - not present at rest - worse in the thumb - noticed with holding the remote - no known family history  - not at rest  Review of Systems   Social History   Tobacco Use  Smoking Status Former Smoker  . Packs/day: 0.50  . Years: 20.00  . Pack years: 10.00  . Types: Cigarettes  . Quit date: 10/27/1989  . Years since quitting: 31.0  Smokeless Tobacco Never Used  Tobacco Comment   Smoked off and on in 20 year period        Objective:    BP Readings from Last 3 Encounters:  11/19/20 102/60  05/29/20 118/68  04/27/20 110/70   Wt Readings from Last 3 Encounters:  11/19/20 173 lb (78.5 kg)  05/29/20 166 lb (75.3 kg)  04/27/20 163 lb 8 oz (74.2 kg)    BP 102/60   Pulse 73   Temp (!) 97 F (36.1 C) (Temporal)   Ht 5\' 4"  (1.626 m)   Wt 173 lb (78.5 kg)   SpO2 98%   BMI 29.70 kg/m    Physical Exam Constitutional:      General: She is not in acute distress.    Appearance: She is well-developed. She is not diaphoretic.  HENT:     Right Ear: External ear normal.     Left Ear: External ear normal.     Nose: Nose normal.  Eyes:     Conjunctiva/sclera: Conjunctivae normal.  Neck:     Vascular: Hepatojugular reflux and JVD present.  Cardiovascular:     Rate and Rhythm: Normal rate and regular  rhythm.     Heart sounds: Murmur heard.    Pulmonary:     Effort: Pulmonary effort is normal. No tachypnea.     Breath sounds: Normal breath sounds. No decreased breath sounds, wheezing, rhonchi or rales.  Musculoskeletal:     Cervical back: Neck supple.  Skin:    General: Skin is warm and dry.     Capillary Refill: Capillary refill takes less than 2 seconds.  Neurological:     Mental Status: She is alert. Mental status is at baseline.  Psychiatric:        Mood and Affect: Mood normal.        Behavior: Behavior normal.      CXR my read: atherosclerotic disease. No acute effusion or consolidation     Assessment & Plan:   Problem List Items Addressed This Visit      Cardiovascular and Mediastinum   Essential hypertension    BP controlled on lisinopril-hctz 10-12.5      Relevant Medications   lisinopril-hydrochlorothiazide (ZESTORETIC) 10-12.5 MG tablet   Other Relevant Orders   ECHOCARDIOGRAM  COMPLETE   Comprehensive metabolic panel   CBC     Other   Hyperlipidemia - Primary    Lab Results  Component Value Date   CHOL 233 (H) 04/20/2020   HDL 54.70 \ 04/20/2020   LDLDIRECT 109.0 04/20/2020   TRIG (H) 04/20/2020    443.0 Triglyceride is over 400; calculations on Lipids are invalid.   CHOLHDL 4 04/20/2020   On atrovastatin 40 mg.       Relevant Medications   lisinopril-hydrochlorothiazide (ZESTORETIC) 10-12.5 MG tablet   Other Relevant Orders   Lipid panel   Depression, major, single episode, complete remission ([place])    Doing well on citalopram 20 mg      Dyspnea on exertion    Concerning for CHF in setting of elevated JVD but also with murmur on exam so could be aortic stenosis. Labs today if BNP elevated will trial lasix. CXR w/o obvious effusion (f/u final read) and legs without edema. ECHO to evaluate murmur and heart function.       Relevant Orders   Brain natriuretic peptide   DG Chest 2 View   ECHOCARDIOGRAM COMPLETE   Comprehensive metabolic  panel   CBC       Return in about 4 weeks (around 12/17/2020).  [name], MD  This visit occurred during the SARS-CoV-2 public health emergency.  Safety protocols were in place, including screening questions prior to the visit, additional usage of staff PPE, and extensive cleaning of exam room while observing appropriate contact time as indicated for disinfecting solutions.

## 2020-11-19 NOTE — Patient Instructions (Signed)
#  Breathing - labs and x-ray today - may end up starting medication - echo for your heart  #tremor - see if it is better on days you drink alcohol  - update

## 2020-11-20 ENCOUNTER — Other Ambulatory Visit: Payer: Self-pay | Admitting: Family Medicine

## 2020-11-20 DIAGNOSIS — R911 Solitary pulmonary nodule: Secondary | ICD-10-CM

## 2020-11-23 ENCOUNTER — Encounter: Payer: Self-pay | Admitting: Family Medicine

## 2020-11-23 DIAGNOSIS — G250 Essential tremor: Secondary | ICD-10-CM

## 2020-11-23 MED ORDER — PROPRANOLOL HCL ER 60 MG PO CP24: 60.0000 mg | ORAL_CAPSULE | Freq: Every day | ORAL | 0 refills | Status: DC

## 2020-11-23 NOTE — Addendum Note (Signed)
Addended by: Virl Cagey on: 11/23/2020 11:03 AM   Modules accepted: Orders

## 2020-11-24 ENCOUNTER — Other Ambulatory Visit: Payer: Self-pay

## 2020-11-24 ENCOUNTER — Ambulatory Visit (INDEPENDENT_AMBULATORY_CARE_PROVIDER_SITE_OTHER): Payer: Medicare HMO

## 2020-11-24 ENCOUNTER — Telehealth: Payer: Self-pay

## 2020-11-24 DIAGNOSIS — R0609 Other forms of dyspnea: Secondary | ICD-10-CM

## 2020-11-24 DIAGNOSIS — I10 Essential (primary) hypertension: Secondary | ICD-10-CM

## 2020-11-24 DIAGNOSIS — R0600 Dyspnea, unspecified: Secondary | ICD-10-CM

## 2020-11-24 LAB — ECHOCARDIOGRAM COMPLETE
AR max vel: 1.84 cm2
AV Area VTI: 1.86 cm2
AV Area mean vel: 1.78 cm2
AV Mean grad: 4 mmHg
AV Peak grad: 7.6 mmHg
Ao pk vel: 1.38 m/s
Area-P 1/2: 3.95 cm2
S' Lateral: 2.8 cm

## 2020-11-24 NOTE — Telephone Encounter (Signed)
Left message for patient to call back to relay information about her CT scan appointment. Scheduled for 12/09/20 at 3:30 pm, arrive at 3:15 pm at [facility]. No food 4 hours prior, only liquids.  Address: [address], [place], [place] [address] Phone: [phone]

## 2020-11-24 NOTE — Telephone Encounter (Signed)
Patient advised of all the information below. Patient verbalized understanding.

## 2020-12-09 ENCOUNTER — Ambulatory Visit
Admission: RE | Admit: 2020-12-09 | Discharge: 2020-12-09 | Disposition: A | Discharge status: Home / Self Care | Payer: Medicare HMO | Source: Ambulatory Visit | Attending: Family Medicine | Admitting: Family Medicine

## 2020-12-09 ENCOUNTER — Other Ambulatory Visit: Payer: Self-pay

## 2020-12-09 DIAGNOSIS — R911 Solitary pulmonary nodule: Secondary | ICD-10-CM | POA: Insufficient documentation

## 2020-12-09 DIAGNOSIS — J984 Other disorders of lung: Secondary | ICD-10-CM | POA: Diagnosis not present

## 2020-12-09 DIAGNOSIS — R5383 Other fatigue: Secondary | ICD-10-CM | POA: Diagnosis not present

## 2020-12-09 DIAGNOSIS — M47819 Spondylosis without myelopathy or radiculopathy, site unspecified: Secondary | ICD-10-CM | POA: Diagnosis not present

## 2020-12-09 DIAGNOSIS — I2510 Atherosclerotic heart disease of native coronary artery without angina pectoris: Secondary | ICD-10-CM | POA: Diagnosis not present

## 2020-12-09 IMAGING — CT CT CHEST W/ CM
2 of 4 series · 15 of 36 positions shown, 18 images · IV contrast (omnipaque)
Comparison: Chest x-ray [DATE]

CLINICAL DATA: Abnormal lung nodule greater than 1 cm. Fatigue with
shortness of breath on exertion for 2-3 months.

EXAM:
CT CHEST WITH CONTRAST
TECHNIQUE: Multidetector CT imaging of the chest was performed during
intravenous contrast administration.
CONTRAST:  60mL OMNIPAQUE IOHEXOL 300 MG/ML  SOLN

[Series 3: axial chest 2.00 · axial · 0.61mm/px · z∈[-1227,-957]mm · 12 of 161 slices shown, 15 images]
[im 13/161  mediastinal]
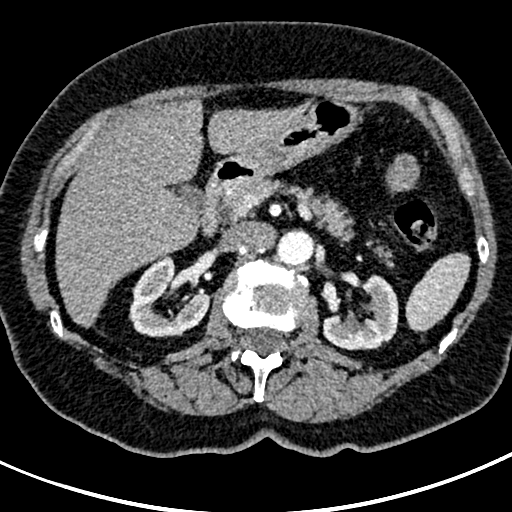
[im 13/161  lung]
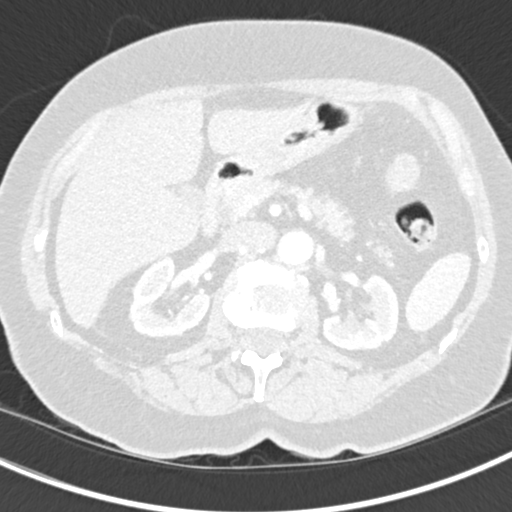
[im 25/161  lung]
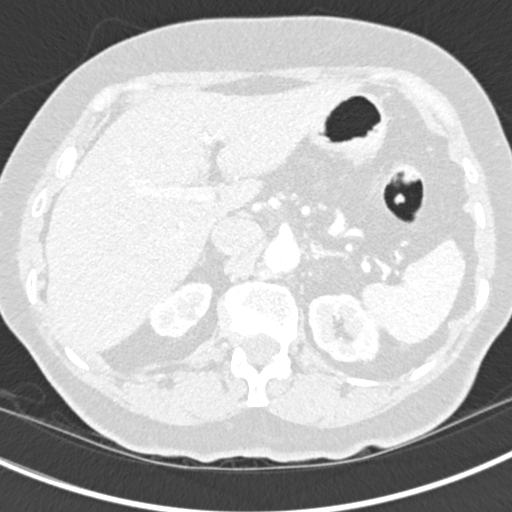
[im 37/161  lung]
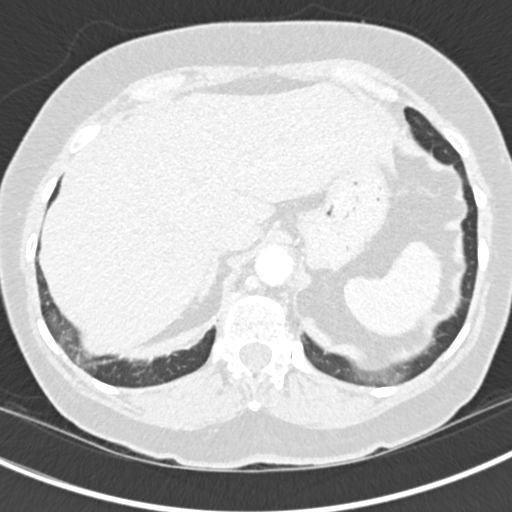
[im 50/161  lung]
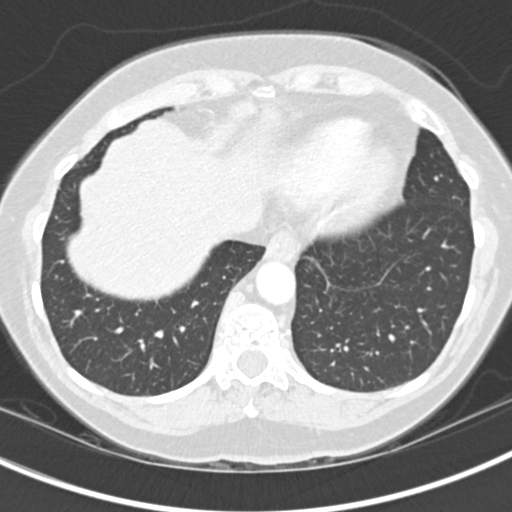
[im 62/161  mediastinal]
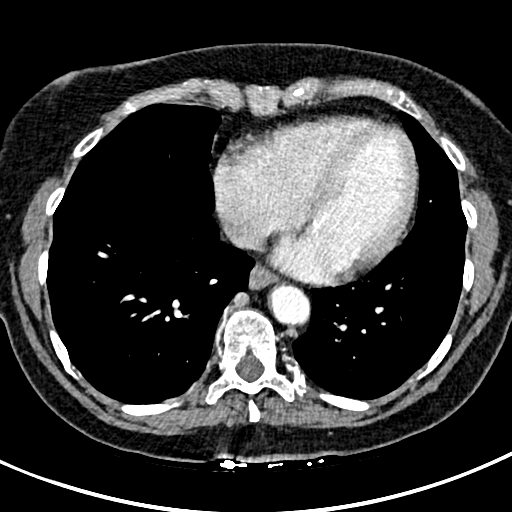
[im 62/161  lung]
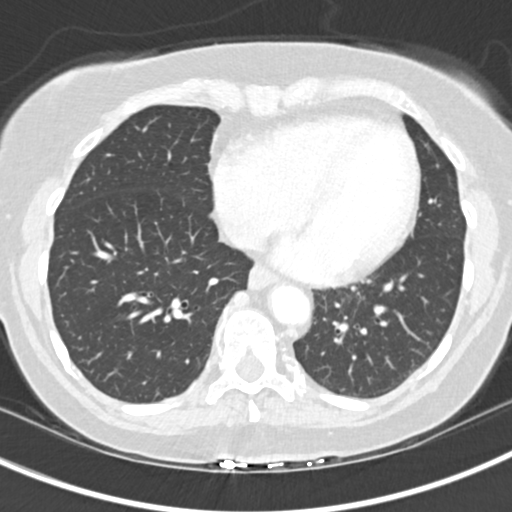
[im 74/161  lung]
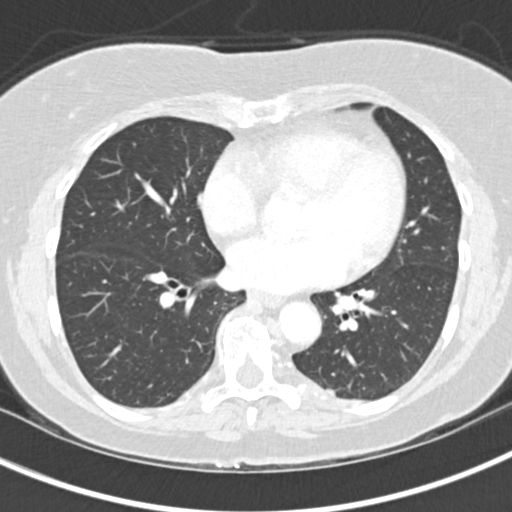
[im 87/161  lung]
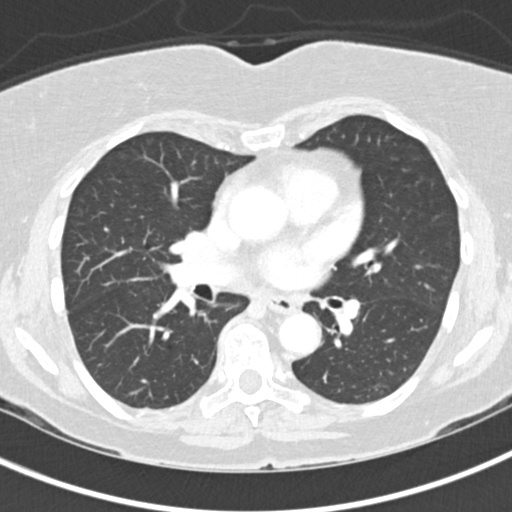
[im 99/161  lung]
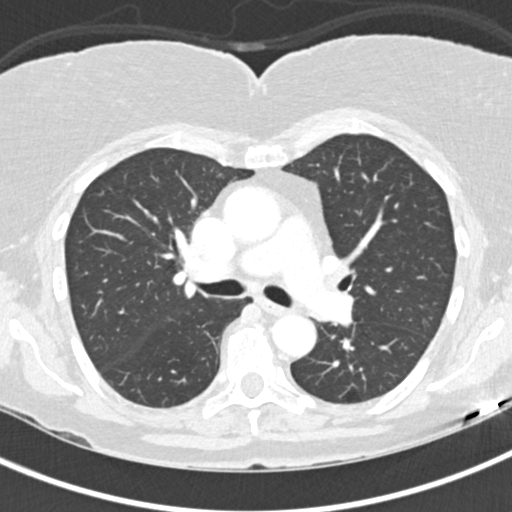
[im 111/161  mediastinal]
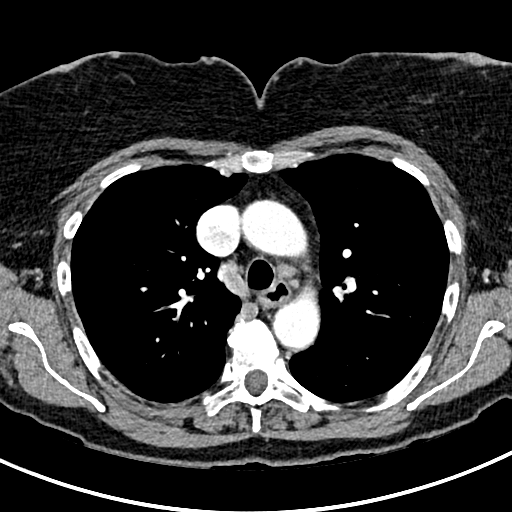
[im 111/161  lung]
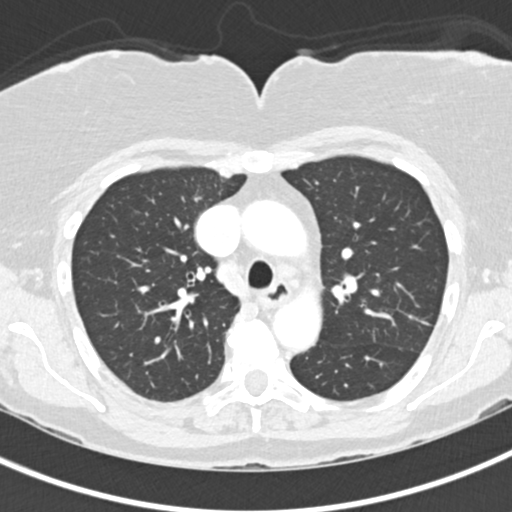
[im 124/161  lung]
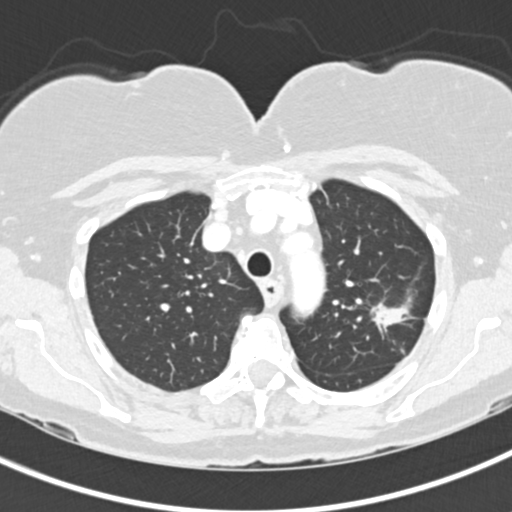
[im 136/161  lung]
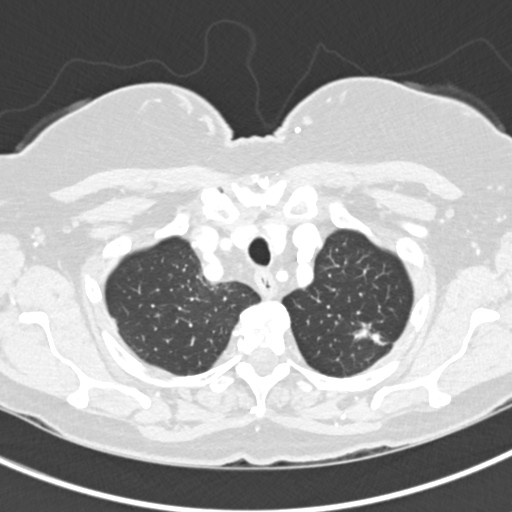
[im 148/161  lung]
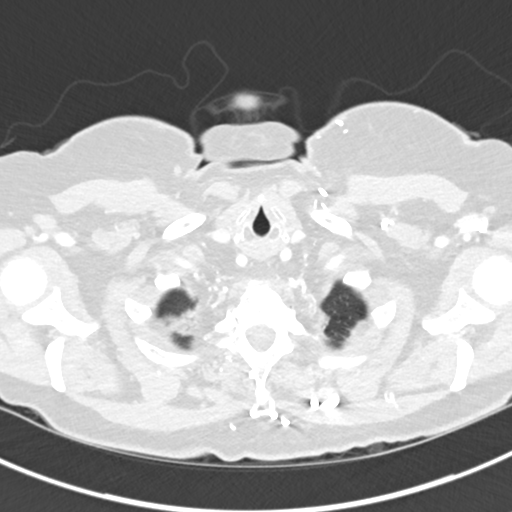

[Series 5: coronal chest 2.00 cor · coronal · 0.61mm/px · 3 of 134 slices shown]
[im 27/134  lung]
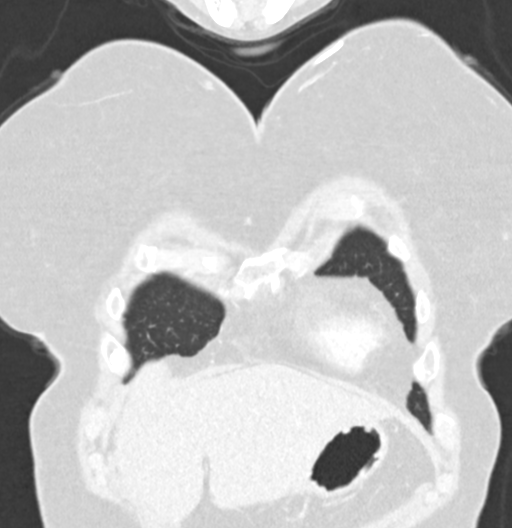
[im 54/134  lung]
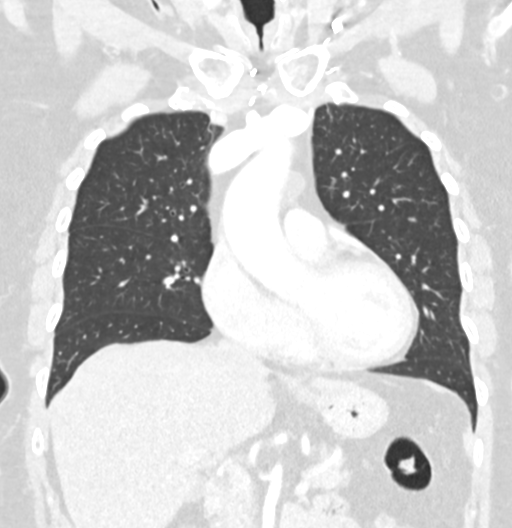
[im 80/134  lung]
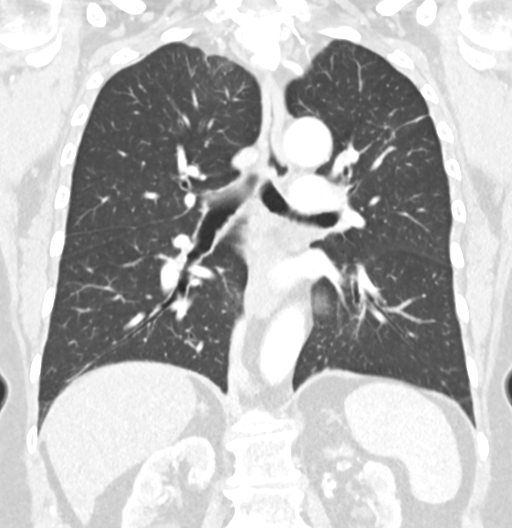

[15 of 36 positions shown; findings below may reference images not displayed]

FINDINGS: Cardiovascular: Normal heart size. Mild mitral annular
calcifications. No significant pericardial effusion. The thoracic
aorta is normal in caliber. Mild atherosclerotic plaque of the
thoracic aorta. No definite coronary artery calcifications. The main
pulmonary artery is normal in caliber.

Mediastinum/Nodes: No enlarged mediastinal, hilar, or axillary lymph
nodes. Thyroid gland, trachea, and esophagus demonstrate no
significant findings.

Lungs/Pleura: Spiculated 2.9 x 1.8 cm left upper lobe nodule that
correlates with finding on chest x-ray. Associated tethering of the
pleura. Calcified pulmonary micronodule within the right lower lobe
([DATE]). No pulmonary mass. No focal consolidation. No pleural
effusion. No pneumothorax.

Upper Abdomen: Colonic diverticulosis.

Musculoskeletal:

No chest wall abnormality.

No suspicious lytic or blastic osseous lesions. No acute displaced
fracture. Multilevel degenerative changes of the spine.
IMPRESSION: 1. Spiculated 2.9 x 1.8 cm left upper lobe nodule concerning for
malignancy. Additional imaging evaluation or consultation with
Pulmonology or Thoracic Surgery recommended.
2. Right lower lobe calcified pulmonary micronodule.
3. Colonic diverticulosis.
4. Aortic Atherosclerosis ([ZO]-[ZO]) including mild mild FAVORS
annular calcifications.

These results will be called to the ordering clinician or
representative by the Radiologist Assistant, and communication
documented in the PACS or [REDACTED].

## 2020-12-09 MED ORDER — IOHEXOL 300 MG/ML  SOLN
60.0000 mL | Freq: Once | INTRAMUSCULAR | Status: AC | PRN
  Administered 2020-12-09: 60 mL via INTRAVENOUS

## 2020-12-10 ENCOUNTER — Telehealth: Payer: Self-pay | Admitting: Pulmonary Disease

## 2020-12-10 ENCOUNTER — Telehealth: Payer: Self-pay

## 2020-12-10 DIAGNOSIS — R911 Solitary pulmonary nodule: Secondary | ICD-10-CM

## 2020-12-10 DIAGNOSIS — IMO0001 Reserved for inherently not codable concepts without codable children: Secondary | ICD-10-CM

## 2020-12-10 NOTE — Telephone Encounter (Signed)
Contacted pt and advised of results and referral to pulmonary and date and location. Pt reports she thinks her daughter used to work at a [facility] pulmonary office but she is not sure. Pt is fine with going to [place] or [place] locations. Answered questions for pt and pt verbalized she did not have any further questions. Pt was not in any distress from the result and stated "I am ok with it, I know I just have to do what I have to do." Advised if her daughter had any questions she could contact the office. Advised our referral person will contact her when the apt with pulmonary is confirmed. Pt was very appreciative for the call and verbalized understanding.

## 2020-12-10 NOTE — Telephone Encounter (Signed)
[facility] radiology called report on CT of Chest;  Sending note to Dr Einar Pheasant; Larene Beach RN has already communicated to [facility] and sending this note now. CT of Chest in Epic.

## 2020-12-10 NOTE — Telephone Encounter (Signed)
Attempted to call pt to notify of CT results and got voicemail.    CT was completed due to nodule on XR. CT results are concerning for cancer. I will place a referral to pulmonology.   Given patient has been complaining of SOB this could be related to the lung nodule.   Routing to RN to attempted to call patient again today

## 2020-12-10 NOTE — Telephone Encounter (Signed)
Appreciate the team support and the review to see if she should be seen sooner.

## 2020-12-10 NOTE — Telephone Encounter (Signed)
This sounds fine also make sure that we get thin slices of the CT.

## 2020-12-10 NOTE — Telephone Encounter (Signed)
Patient advised that she is  scheduled for Monday 12/14/20 at 3:30 pm with Dr [name] at [facility] [place]. Patient will speak with her daughter about this and if we need to help arrange anything else she will let us know.

## 2020-12-10 NOTE — Telephone Encounter (Signed)
Dr. [name], can we see patient on 12/14/2020 at 3:30? Abnormal CT.

## 2020-12-10 NOTE — Telephone Encounter (Signed)
Marie Hensley, when you speak with patient can you let her know that she is scheduled to see pulmonologist at [facility] office in [facility] for now May 5th [facility] office is further out also) but I am working with their office on trying to get patient in much sooner. [name] sent message to Dr [name] to see if they can work her in and I am waiting on feedback from her at this point. If they can not work her in much sooner I will call around other offices for options.

## 2020-12-10 NOTE — Telephone Encounter (Signed)
appt scheduled for 12/14/2020 at 3:30. Thana Farr contacted PCP office, who stated that they would advise patient of appt date/time and location.  Spoke to Kenmar with CT and requested thin slices. Suezanne Jacquet will deliver disc to cardiopulmonary.  Nothing further needed at this time.

## 2020-12-14 ENCOUNTER — Encounter: Payer: Self-pay | Admitting: Pulmonary Disease

## 2020-12-14 ENCOUNTER — Ambulatory Visit: Payer: Medicare HMO | Admitting: Pulmonary Disease

## 2020-12-14 ENCOUNTER — Telehealth: Payer: Self-pay | Admitting: Pulmonary Disease

## 2020-12-14 ENCOUNTER — Other Ambulatory Visit: Payer: Self-pay

## 2020-12-14 VITALS — BP 140/82 | HR 63 | Temp 97.7°F | Ht 64.0 in | Wt 172.4 lb

## 2020-12-14 DIAGNOSIS — Z87891 Personal history of nicotine dependence: Secondary | ICD-10-CM | POA: Diagnosis not present

## 2020-12-14 DIAGNOSIS — R590 Localized enlarged lymph nodes: Secondary | ICD-10-CM | POA: Diagnosis not present

## 2020-12-14 DIAGNOSIS — R911 Solitary pulmonary nodule: Secondary | ICD-10-CM

## 2020-12-14 NOTE — Progress Notes (Signed)
Subjective:    Patient ID: Marie Hensley, female    DOB: Dec 26, 1945, 75 y.o.   MRN: [id number]  HPI Patient is a 75 year old former smoker (quit 1991) valuation of a spiculated 2.9 x 1.8 cm left upper lobe nodule incidentally noted on chest x-ray on 20 November 2020.  She is kindly referred by Dr. Waunita Schooner.  The chest x-ray was followed by a chest CT that noted the spiculated lesion with a very small mediastinal node that does not meet malignancy criteria however, requires further evaluation.  The patient states that noticed any weight loss or anorexia.  No cough or sputum production and no hemoptysis.  She had noted some fatigue for approximately 1 to 2 months.  She also noticed some dyspnea on exertion.  A chest x-ray was performed at that time with a concern for potential CHF.  The chest x-ray was clear however, it did show an opacity on the left upper lobe.  It was followed up by the CT.  The finding was confirmed by CT as noted above.  Incidentally the patient had a 2D echo performed on 24 November 2020 that showed normal LVEF of 60 to 65%, normal LV function.  No regional wall motion abnormalities and DD grade I.  No evidence of valvular disease.  Patient voices no other complaint.  Patient presents today with her daughter [name], who is an Therapist, sports.  Patient and daughter had opportunity to ask questions.  Films were reviewed with the patient and her daughter.   Review of Systems A 10 point review of systems was performed and it is as noted above otherwise negative.   Past Medical History:  Diagnosis Date  . Arthritis   . HTN (hypertension)   . Hyperlipemia    Past Surgical History:  Procedure Laterality Date  . APPENDECTOMY  1982  . OOPHORECTOMY Right    Family History  Problem Relation Age of Onset  . Addison's disease Daughter   . Hypertension Mother        died at 79  . Alcohol abuse Father     Social History   Tobacco Use  . Smoking status: Former Smoker    Packs/day:  0.50    Years: 20.00    Pack years: 10.00    Types: Cigarettes    Quit date: 10/27/1989    Years since quitting: 31.1  . Smokeless tobacco: Never Used  . Tobacco comment: Smoked off and on in 20 year period  Substance Use Topics  . Alcohol use: Yes    Comment: 3-4 times a week, 2oz liquor    No Known Allergies  Current Meds  Medication Sig  . atorvastatin (LIPITOR) 40 MG tablet Take 1 tablet (40 mg total) by mouth daily.  . citalopram (CELEXA) 20 MG tablet TAKE ONE TABLET BY MOUTH DAILY  . lisinopril-hydrochlorothiazide (ZESTORETIC) 10-12.5 MG tablet Take 1 tablet by mouth daily.  . propranolol ER (INDERAL LA) 60 MG 24 hr capsule Take 1 capsule (60 mg total) by mouth daily.   Immunization History  Administered Date(s) Administered  . Fluad Quad(high Dose 65+) 05/29/2020  . Influenza,inj,Quad PF,6+ Mos 07/14/2019  . PFIZER Comirnaty(Gray Top)Covid-19 Tri-Sucrose Vaccine 09/30/2020  . PFIZER(Purple Top)SARS-COV-2 Vaccination 12/27/2019, 01/17/2020  . Pneumococcal Conjugate-13 11/12/2015  . Pneumococcal Polysaccharide-23 07/20/2017        Objective:   Physical Exam BP 140/82 (BP Location: Left Arm, Patient Position: Sitting, Cuff Size: Normal)   Pulse 63   Temp 97.7 F (36.5 C) (Temporal)  Ht 5\' 4"  (1.626 m)   Wt 172 lb 6.4 oz (78.2 kg)   SpO2 97%   BMI 29.59 kg/m  GENERAL: Well-developed somewhat overweight woman, no acute distress, fully ambulatory.  Speech is fluent. HEAD: Normocephalic, atraumatic.  EYES: Pupils equal, round, reactive to light.  No scleral icterus.  MOUTH: Nose/mouth/throat not examined due to masking requirements for COVID 19. NECK: Supple. No thyromegaly. Trachea midline. No JVD.  No adenopathy. PULMONARY: Good air entry bilaterally.  No adventitious sounds. CARDIOVASCULAR: S1 and S2. Regular rate and rhythm.  No rubs, murmurs or gallops heard. ABDOMEN: Benign. MUSCULOSKELETAL: No joint deformity, no clubbing, no edema.  NEUROLOGIC: No focal  deficit, no gait disturbance, speech is fluent. SKIN: Intact,warm,dry.  On limited exam no rashes. PSYCH: Mood and behavior normal.  Representative slice of the chest CT performed 10 December 2020:       Assessment & Plan:     ICD-10-CM   1. Left upper lobe pulmonary nodule 2.9X1.8 cm  R91.1 NM PET Image Initial (PI) Skull Base To Thigh    Pulmonary Function Test [facility] Only   Will need bronchoscopy with ENB Mediastinal staging at the time with EBUS PET/CT, PFTs  2. Mediastinal adenopathy - possible  R59.0    Will perform endobronchial ultrasound at the time of bronchoscopy   3. Former smoker  Z87.891    No evidence of relapse   Orders Placed This Encounter  Procedures  . NM PET Image Initial (PI) Skull Base To Thigh    Standing Status:   Future    Standing Expiration Date:   12/14/2021    Scheduling Instructions:     Prior to 4/13    Order Specific Question:   If indicated for the ordered procedure, I authorize the administration of a radiopharmaceutical per Radiology protocol    Answer:   Yes    Order Specific Question:   Preferred imaging location?    Answer:    [facility]  . Pulmonary Function Test [facility] Only    Standing Status:   Future    Standing Expiration Date:   12/14/2021    Order Specific Question:   Full PFT: includes the following: basic spirometry, spirometry pre & post bronchodilator, diffusion capacity (DLCO), lung volumes    Answer:   Full PFT    Benefits, limitations and potential complications of the procedure were discussed with the patient/family including, but not limited to bleeding, hemoptysis, respiratory failure requiring intubation and/or prolongued mechanical ventilation, infection, pneumothorax (collapse of lung) requiring chest tube placement, stroke or even death.  Patient agrees to proceed.   Discussion:  Patient has a left upper lobe nodule that is carcinoma until proven otherwise.  Diagnosis should be made with electro magnetic navigation  bronchoscopy.  Will also evaluate mediastinum at the time of the procedure.  This will entail endobronchial ultrasound to examine the mediastinum and sample as indicated.  Procedure will be scheduled for 23 December 2020 at 12:45 PM.  Patient has been given the appropriate instructions.  We will see the patient in follow-up in 4 to 6 weeks time however in the interim we will continue to be in touch with her and provide her results of testing as these are known.   Renold Don, MD [facility]   *This note was dictated using voice recognition software/Dragon.  Despite best efforts to proofread, errors can occur which can change the meaning.  Any change was purely unintentional.

## 2020-12-14 NOTE — Telephone Encounter (Signed)
Bronch with navigation/cellvizio and EBUS is scheduled for 12/23/2020 at 12:45. CPT:31627,31899,31652,31653 Dx:LUL nodule  Rodena Piety, please see bronch info.

## 2020-12-14 NOTE — Patient Instructions (Signed)
We are going to schedule you for a bronchoscopy on 13 April at 1245, you would have to be in the outpatient surgery area at least an hour before.  You will get full instructions from the preadmission clinic.  We will also schedule a PET/CT and pulmonary function testing.   We will see him in follow-up in 4 to 6 weeks however, we will be in contact with you with the results of the tests as they come by and make the appropriate referrals as needed.

## 2020-12-14 NOTE — H&P (View-Only) (Signed)
Subjective:    Patient ID: Marie Hensley, female    DOB: [birth year], 75 y.o.   MRN: [id number]  HPI Patient is a 75 year old former smoker (quit 1991) valuation of a spiculated 2.9 x 1.8 cm left upper lobe nodule incidentally noted on chest x-ray on 20 November 2020.  She is kindly referred by Dr. Waunita Schooner.  The chest x-ray was followed by a chest CT that noted the spiculated lesion with a very small mediastinal node that does not meet malignancy criteria however, requires further evaluation.  The patient states that noticed any weight loss or anorexia.  No cough or sputum production and no hemoptysis.  She had noted some fatigue for approximately 1 to 2 months.  She also noticed some dyspnea on exertion.  A chest x-ray was performed at that time with a concern for potential CHF.  The chest x-ray was clear however, it did show an opacity on the left upper lobe.  It was followed up by the CT.  The finding was confirmed by CT as noted above.  Incidentally the patient had a 2D echo performed on 24 November 2020 that showed normal LVEF of 60 to 65%, normal LV function.  No regional wall motion abnormalities and DD grade I.  No evidence of valvular disease.  Patient voices no other complaint.  Patient presents today with her daughter [name], who is an Therapist, sports.  Patient and daughter had opportunity to ask questions.  Films were reviewed with the patient and her daughter.   Review of Systems A 10 point review of systems was performed and it is as noted above otherwise negative.   Past Medical History:  Diagnosis Date  . Arthritis   . HTN (hypertension)   . Hyperlipemia    Past Surgical History:  Procedure Laterality Date  . APPENDECTOMY  1982  . OOPHORECTOMY Right    Family History  Problem Relation Age of Onset  . Addison's disease Daughter   . Hypertension Mother        died at 44  . Alcohol abuse Father     Social History   Tobacco Use  . Smoking status: Former Smoker    Packs/day:  0.50    Years: 20.00    Pack years: 10.00    Types: Cigarettes    Quit date: 10/27/1989    Years since quitting: 31.1  . Smokeless tobacco: Never Used  . Tobacco comment: Smoked off and on in 20 year period  Substance Use Topics  . Alcohol use: Yes    Comment: 3-4 times a week, 2oz liquor    No Known Allergies  Current Meds  Medication Sig  . atorvastatin (LIPITOR) 40 MG tablet Take 1 tablet (40 mg total) by mouth daily.  . citalopram (CELEXA) 20 MG tablet TAKE ONE TABLET BY MOUTH DAILY  . lisinopril-hydrochlorothiazide (ZESTORETIC) 10-12.5 MG tablet Take 1 tablet by mouth daily.  . propranolol ER (INDERAL LA) 60 MG 24 hr capsule Take 1 capsule (60 mg total) by mouth daily.   Immunization History  Administered Date(s) Administered  . Fluad Quad(high Dose 65+) 05/29/2020  . Influenza,inj,Quad PF,6+ Mos 07/14/2019  . PFIZER Comirnaty(Gray Top)Covid-19 Tri-Sucrose Vaccine 09/30/2020  . PFIZER(Purple Top)SARS-COV-2 Vaccination 12/27/2019, 01/17/2020  . Pneumococcal Conjugate-13 11/12/2015  . Pneumococcal Polysaccharide-23 07/20/2017        Objective:   Physical Exam BP 140/82 (BP Location: Left Arm, Patient Position: Sitting, Cuff Size: Normal)   Pulse 63   Temp 97.7 F (36.5 C) (Temporal)  Ht 5\' 4"  (1.626 m)   Wt 172 lb 6.4 oz (78.2 kg)   SpO2 97%   BMI 29.59 kg/m  GENERAL: Well-developed somewhat overweight woman, no acute distress, fully ambulatory.  Speech is fluent. HEAD: Normocephalic, atraumatic.  EYES: Pupils equal, round, reactive to light.  No scleral icterus.  MOUTH: Nose/mouth/throat not examined due to masking requirements for COVID 19. NECK: Supple. No thyromegaly. Trachea midline. No JVD.  No adenopathy. PULMONARY: Good air entry bilaterally.  No adventitious sounds. CARDIOVASCULAR: S1 and S2. Regular rate and rhythm.  No rubs, murmurs or gallops heard. ABDOMEN: Benign. MUSCULOSKELETAL: No joint deformity, no clubbing, no edema.  NEUROLOGIC: No focal  deficit, no gait disturbance, speech is fluent. SKIN: Intact,warm,dry.  On limited exam no rashes. PSYCH: Mood and behavior normal.  Representative slice of the chest CT performed 10 December 2020:       Assessment & Plan:     ICD-10-CM   1. Left upper lobe pulmonary nodule 2.9X1.8 cm  R91.1 NM PET Image Initial (PI) Skull Base To Thigh    Pulmonary Function Test [facility] Only   Will need bronchoscopy with ENB Mediastinal staging at the time with EBUS PET/CT, PFTs  2. Mediastinal adenopathy - possible  R59.0    Will perform endobronchial ultrasound at the time of bronchoscopy   3. Former smoker  Z87.891    No evidence of relapse   Orders Placed This Encounter  Procedures  . NM PET Image Initial (PI) Skull Base To Thigh    Standing Status:   Future    Standing Expiration Date:   12/14/2021    Scheduling Instructions:     Prior to 4/13    Order Specific Question:   If indicated for the ordered procedure, I authorize the administration of a radiopharmaceutical per Radiology protocol    Answer:   Yes    Order Specific Question:   Preferred imaging location?    Answer:   [facility]  . Pulmonary Function Test [facility] Only    Standing Status:   Future    Standing Expiration Date:   12/14/2021    Order Specific Question:   Full PFT: includes the following: basic spirometry, spirometry pre & post bronchodilator, diffusion capacity (DLCO), lung volumes    Answer:   Full PFT    Benefits, limitations and potential complications of the procedure were discussed with the patient/family including, but not limited to bleeding, hemoptysis, respiratory failure requiring intubation and/or prolongued mechanical ventilation, infection, pneumothorax (collapse of lung) requiring chest tube placement, stroke or even death.  Patient agrees to proceed.   Discussion:  Patient has a left upper lobe nodule that is carcinoma until proven otherwise.  Diagnosis should be made with electro magnetic navigation  bronchoscopy.  Will also evaluate mediastinum at the time of the procedure.  This will entail endobronchial ultrasound to examine the mediastinum and sample as indicated.  Procedure will be scheduled for 23 December 2020 at 12:45 PM.  Patient has been given the appropriate instructions.  We will see the patient in follow-up in 4 to 6 weeks time however in the interim we will continue to be in touch with her and provide her results of testing as these are known.   Renold Don, MD [facility]   *This note was dictated using voice recognition software/Dragon.  Despite best efforts to proofread, errors can occur which can change the meaning.  Any change was purely unintentional.

## 2020-12-15 NOTE — Telephone Encounter (Signed)
PA is not required for the codes [place], [id number], [id number], [id number] Refer # [id number]

## 2020-12-15 NOTE — Telephone Encounter (Signed)
Patient is aware of below dates/times. She voiced her understanding and had no further questions.  Nothing further needed at this time.

## 2020-12-15 NOTE — Telephone Encounter (Signed)
Phone pre admit visit 12/18/2020 8-1pm and covid test 4/11/ at 10:05  Lm for patient.

## 2020-12-18 ENCOUNTER — Encounter
Admission: RE | Admit: 2020-12-18 | Discharge: 2020-12-18 | Disposition: A | Discharge status: Home / Self Care | Payer: Medicare HMO | Source: Ambulatory Visit | Attending: Pulmonary Disease | Admitting: Pulmonary Disease

## 2020-12-18 ENCOUNTER — Other Ambulatory Visit: Payer: Self-pay

## 2020-12-18 HISTORY — DX: Dyspnea, unspecified: R06.00

## 2020-12-18 NOTE — Patient Instructions (Signed)
Your procedure is scheduled on:12-23-20 WEDNESDAY Report to the Registration Desk on the 1st floor of the [facility]-Then proceed to the 2nd floor Surgery Desk in the [facility] To find out your arrival time, please call [phone] between 1PM - 3PM on:12-22-20 TUESDAY  REMEMBER: Instructions that are not followed completely may result in serious medical risk, up to and including death; or upon the discretion of your surgeon and anesthesiologist your surgery may need to be rescheduled.  Do not eat food after midnight the night before surgery.  No gum chewing, lozengers or hard candies.  You may however, drink CLEAR liquids up to 2 hours before you are scheduled to arrive for your surgery. Do not drink anything within 2 hours of your scheduled arrival time.  Clear liquids include: - water  - apple juice without pulp - gatorade (not RED) - black coffee or tea (Do NOT add milk or creamers to the coffee or tea) Do NOT drink anything that is not on this list.  DO NOT TAKE ANY MEDICATION THE MORNING OF SURGERY  One week prior to surgery: Stop Anti-inflammatories (NSAIDS) such as Advil, Aleve, Ibuprofen, Motrin, Naproxen, Naprosyn and Aspirin based products such as Excedrin, Goodys Powder, BC Powder-OK TO TAKE TYLENOL IF NEEDED  Stop ANY OVER THE COUNTER supplements until after surgery.  No Alcohol for 24 hours before or after surgery.  No Smoking including e-cigarettes for 24 hours prior to surgery.  No chewable tobacco products for at least 6 hours prior to surgery.  No nicotine patches on the day of surgery.  Do not use any "recreational" drugs for at least a week prior to your surgery.  Please be advised that the combination of cocaine and anesthesia may have negative outcomes, up to and including death. If you test positive for cocaine, your surgery will be cancelled.  On the morning of surgery brush your teeth with toothpaste and water, you may rinse your mouth with mouthwash  if you wish. Do not swallow any toothpaste or mouthwash.  Do not wear jewelry, make-up, hairpins, clips or nail polish.  Do not wear lotions, powders, or perfumes.   Do not shave body from the neck down 48 hours prior to surgery just in case you cut yourself which could leave a site for infection.  Also, freshly shaved skin may become irritated if using the CHG soap.  Contact lenses, hearing aids and dentures may not be worn into surgery.  Do not bring valuables to the hospital. [facility] is not responsible for any missing/lost belongings or valuables.   Notify your doctor if there is any change in your medical condition (cold, fever, infection).  Wear comfortable clothing (specific to your surgery type) to the hospital.  Plan for stool softeners for home use; pain medications have a tendency to cause constipation. You can also help prevent constipation by eating foods high in fiber such as fruits and vegetables and drinking plenty of fluids as your diet allows.  After surgery, you can help prevent lung complications by doing breathing exercises.  Take deep breaths and cough every 1-2 hours. Your doctor may order a device called an Incentive Spirometer to help you take deep breaths. When coughing or sneezing, hold a pillow firmly against your incision with both hands. This is called "splinting." Doing this helps protect your incision. It also decreases belly discomfort.  If you are being admitted to the hospital overnight, leave your suitcase in the car. After surgery it may be brought  to your room.  If you are being discharged the day of surgery, you will not be allowed to drive home. You will need a responsible adult (18 years or older) to drive you home and stay with you that night.   If you are taking public transportation, you will need to have a responsible adult (18 years or older) with you. Please confirm with your physician that it is acceptable to use public  transportation.   Please call the [place] Dept. at [phone] if you have any questions about these instructions.  Surgery Visitation Policy:  Patients undergoing a surgery or procedure may have one family member or support person with them as long as that person is not COVID-19 positive or experiencing its symptoms.  That person may remain in the waiting area during the procedure.  Inpatient Visitation:    Visiting hours are 7 a.m. to 8 p.m. Inpatients will be allowed two visitors daily. The visitors may change each day during the patient's stay. No visitors under the age of 65. Any visitor under the age of 59 must be accompanied by an adult. The visitor must pass COVID-19 screenings, use hand sanitizer when entering and exiting the patient's room and wear a mask at all times, including in the patient's room. Patients must also wear a mask when staff or their visitor are in the room. Masking is required regardless of vaccination status.

## 2020-12-21 ENCOUNTER — Other Ambulatory Visit: Payer: Self-pay

## 2020-12-21 ENCOUNTER — Encounter
Admission: RE | Admit: 2020-12-21 | Discharge: 2020-12-21 | Disposition: A | Discharge status: Home / Self Care | Payer: Medicare HMO | Source: Ambulatory Visit | Attending: Pulmonary Disease | Admitting: Pulmonary Disease

## 2020-12-21 ENCOUNTER — Other Ambulatory Visit: Payer: Self-pay | Admitting: Family Medicine

## 2020-12-21 ENCOUNTER — Other Ambulatory Visit: Payer: Medicare HMO

## 2020-12-21 DIAGNOSIS — Z20822 Contact with and (suspected) exposure to covid-19: Secondary | ICD-10-CM | POA: Insufficient documentation

## 2020-12-21 DIAGNOSIS — Z01818 Encounter for other preprocedural examination: Secondary | ICD-10-CM | POA: Insufficient documentation

## 2020-12-21 DIAGNOSIS — I10 Essential (primary) hypertension: Secondary | ICD-10-CM | POA: Diagnosis not present

## 2020-12-21 DIAGNOSIS — Z01810 Encounter for preprocedural cardiovascular examination: Secondary | ICD-10-CM | POA: Diagnosis not present

## 2020-12-21 DIAGNOSIS — G250 Essential tremor: Secondary | ICD-10-CM

## 2020-12-21 LAB — POTASSIUM: Potassium: 4.3 mmol/L (ref 3.5–5.1)

## 2020-12-22 ENCOUNTER — Encounter
Admission: RE | Admit: 2020-12-22 | Discharge: 2020-12-22 | Disposition: A | Discharge status: Home / Self Care | Payer: Medicare HMO | Source: Ambulatory Visit | Attending: Pulmonary Disease | Admitting: Pulmonary Disease

## 2020-12-22 ENCOUNTER — Other Ambulatory Visit: Payer: Self-pay

## 2020-12-22 ENCOUNTER — Telehealth: Payer: Self-pay

## 2020-12-22 DIAGNOSIS — I348 Other nonrheumatic mitral valve disorders: Secondary | ICD-10-CM | POA: Diagnosis not present

## 2020-12-22 DIAGNOSIS — R918 Other nonspecific abnormal finding of lung field: Secondary | ICD-10-CM | POA: Diagnosis not present

## 2020-12-22 DIAGNOSIS — M47815 Spondylosis without myelopathy or radiculopathy, thoracolumbar region: Secondary | ICD-10-CM | POA: Insufficient documentation

## 2020-12-22 DIAGNOSIS — I700 Atherosclerosis of aorta: Secondary | ICD-10-CM | POA: Diagnosis not present

## 2020-12-22 DIAGNOSIS — K5730 Diverticulosis of large intestine without perforation or abscess without bleeding: Secondary | ICD-10-CM | POA: Diagnosis not present

## 2020-12-22 DIAGNOSIS — R911 Solitary pulmonary nodule: Secondary | ICD-10-CM | POA: Diagnosis present

## 2020-12-22 LAB — SARS CORONAVIRUS 2 (TAT 6-24 HRS): SARS Coronavirus 2: NEGATIVE

## 2020-12-22 LAB — GLUCOSE, CAPILLARY: Glucose-Capillary: 137 mg/dL — ABNORMAL HIGH (ref 70–99)

## 2020-12-22 IMAGING — PT NM PET TUM IMG INITIAL (PI) SKULL BASE T - THIGH
1 of 9 series · 1 of 25 positions shown · non-contrast
Comparison: CT chest [DATE]

CLINICAL DATA: Initial treatment strategy for pulmonary nodules.

EXAM:
NUCLEAR MEDICINE PET SKULL BASE TO THIGH
TECHNIQUE: 0.5 mCi F-18 FDG was injected intravenously. Full-ring PET imaging
was performed from the skull base to thigh after the radiotracer. CT
data was obtained and used for attenuation correction and anatomic
localization.
Fasting blood glucose: 137 mg/dl

[Series 3: ct wb 5.0 b30f · axial · 5.0mm · 0.98mm/px · 1 of 290 slices shown]
[im 1/290  soft-tissue]
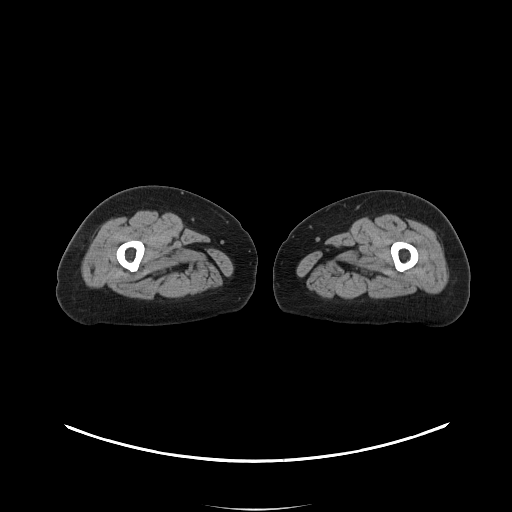

[1 of 25 positions shown; findings below may reference images not displayed]

FINDINGS: Mediastinal blood pool activity: SUV max

Liver activity: SUV max NA

NECK: No significant abnormal hypermetabolic activity in this
region.

Incidental CT findings: none

CHEST: The spiculated 2.2 by 1.8 cm left upper lobe nodule on image
68 series 3 has maximum SUV of 8.2, compatible with malignancy. The
0.6 by 0.7 cm peribronchovascular node between this lesion and the
hilum is faintly hypermetabolic with maximum SUV 2.5.

Small hypermetabolic left hilar lymph nodes are present. The most
hypermetabolic of these is just posterior to the left pulmonary
artery with maximum SUV of 6.9. Two other small hypermetabolic left
hilar lymph nodes are also present.

Incidental CT findings: Atherosclerotic calcification of the aortic
arch and branch vessels. Mild mitral valve calcification.

ABDOMEN/PELVIS: Focal hypermetabolic activity in the splenic flexure
of the colon, mass or polyp not excluded given the focality of
activity, maximum SUV 9.6. Focal physiologic activity is a
differential diagnostic consideration.

Incidental CT findings: Aortoiliac atherosclerotic vascular disease.
Scattered diverticula in the descending and sigmoid colon.

SKELETON: No significant abnormal hypermetabolic activity in this
region.

Incidental CT findings: Thoracic and lumbar spondylosis and
degenerative disc disease.
IMPRESSION: 1. Hypermetabolic spiculated left upper lobe nodule, maximum SUV
8.2, compatible with malignancy.
2. Three small hypermetabolic left hilar lymph nodes are present,
maximum SUV 6.9, favoring local hilar involvement. There is also a
faintly metabolic peribronchovascular node between the dominant
nodule and the left hilum.
3. Taking into account the above findings, and assuming non-small
cell lung cancer, this likely represents T1c N1 M0 disease (stage
IIB).
4. Focal hypermetabolic activity in the splenic flexure of the
colon, conceivably physiologic but concerning for the possibility of
a focal mass or polyp. Unlikely to be a metastatic lesion from lung
cancer in this location, but possibly a second primary. Correlate
with patient's colon screening history in determining further
workup.
5. Other imaging findings of potential clinical significance: Aortic
Atherosclerosis ([LR]-[LR]). Scattered descending and sigmoid
colon diverticula. Thoracolumbar spondylosis and degenerative disc
disease. Mild mitral valve calcification.

## 2020-12-22 MED ORDER — FLUDEOXYGLUCOSE F - 18 (FDG) INJECTION
8.9000 | Freq: Once | INTRAVENOUS | Status: AC | PRN
  Administered 2020-12-22: 9.54 via INTRAVENOUS

## 2020-12-22 NOTE — Telephone Encounter (Signed)
Patient is aware of date/time of covid test prior to PFT.  

## 2020-12-23 ENCOUNTER — Ambulatory Visit
Admission: RE | Admit: 2020-12-23 | Discharge: 2020-12-23 | Disposition: A | Discharge status: Home / Self Care | Payer: Medicare HMO | Attending: Pulmonary Disease | Admitting: Pulmonary Disease

## 2020-12-23 ENCOUNTER — Ambulatory Visit: Payer: Medicare HMO

## 2020-12-23 ENCOUNTER — Encounter
Admission: RE | Disposition: A | Discharge status: Home / Self Care | Payer: Self-pay | Source: Home / Self Care | Attending: Pulmonary Disease

## 2020-12-23 ENCOUNTER — Ambulatory Visit: Payer: Medicare HMO | Admitting: Anesthesiology

## 2020-12-23 ENCOUNTER — Other Ambulatory Visit: Payer: Self-pay

## 2020-12-23 DIAGNOSIS — Z9889 Other specified postprocedural states: Secondary | ICD-10-CM | POA: Diagnosis not present

## 2020-12-23 DIAGNOSIS — E785 Hyperlipidemia, unspecified: Secondary | ICD-10-CM | POA: Diagnosis not present

## 2020-12-23 DIAGNOSIS — Z87891 Personal history of nicotine dependence: Secondary | ICD-10-CM | POA: Diagnosis not present

## 2020-12-23 DIAGNOSIS — N184 Chronic kidney disease, stage 4 (severe): Secondary | ICD-10-CM | POA: Diagnosis not present

## 2020-12-23 DIAGNOSIS — J984 Other disorders of lung: Secondary | ICD-10-CM | POA: Diagnosis not present

## 2020-12-23 DIAGNOSIS — R590 Localized enlarged lymph nodes: Secondary | ICD-10-CM | POA: Diagnosis present

## 2020-12-23 DIAGNOSIS — R911 Solitary pulmonary nodule: Secondary | ICD-10-CM | POA: Diagnosis not present

## 2020-12-23 DIAGNOSIS — R918 Other nonspecific abnormal finding of lung field: Secondary | ICD-10-CM | POA: Diagnosis not present

## 2020-12-23 DIAGNOSIS — I129 Hypertensive chronic kidney disease with stage 1 through stage 4 chronic kidney disease, or unspecified chronic kidney disease: Secondary | ICD-10-CM | POA: Diagnosis not present

## 2020-12-23 HISTORY — PX: VIDEO BRONCHOSCOPY WITH ENDOBRONCHIAL NAVIGATION: SHX6175

## 2020-12-23 HISTORY — PX: VIDEO BRONCHOSCOPY WITH ENDOBRONCHIAL ULTRASOUND: SHX6177

## 2020-12-23 IMAGING — DX DG CHEST 1V PORT
1 series · 1 of 1 positions shown · non-contrast
Comparison: Portable exam [KK] hours compared to [DATE]

CLINICAL DATA: Post LEFT biopsy

EXAM:
PORTABLE CHEST 1 VIEW

[chest ap]
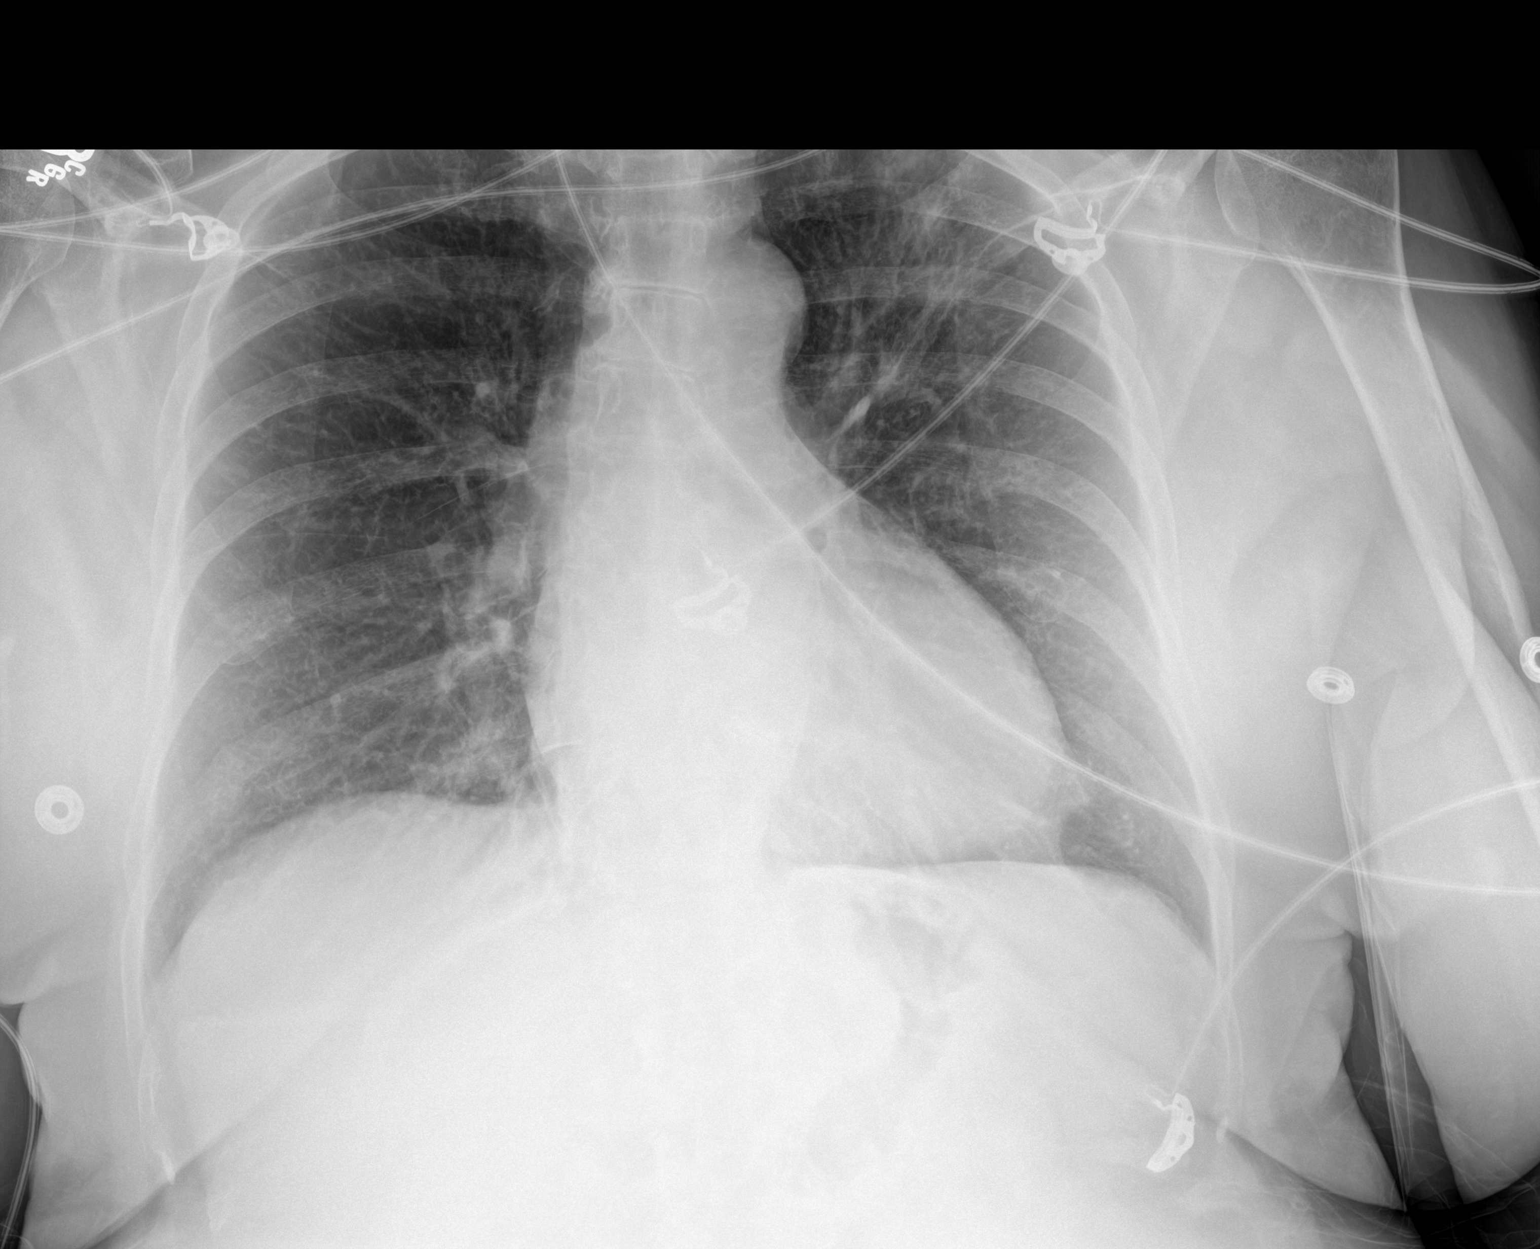

[1 of 1 positions shown; findings below may reference images not displayed]

FINDINGS: Normal heart size, mediastinal contours, and pulmonary vascularity.

Patchy density in the LEFT upper lobe which may represent either
edema or minimal hemorrhage related to preceding procedure.

Previously identified nodular density obscured.

Remaining lungs clear.

No pleural effusion or pneumothorax.
IMPRESSION: Expected postprocedural changes in the LEFT upper lobe.

No acute abnormalities.

## 2020-12-23 SURGERY — VIDEO BRONCHOSCOPY WITH ENDOBRONCHIAL NAVIGATION
Anesthesia: General | Laterality: Left

## 2020-12-23 MED ORDER — SODIUM CHLORIDE 0.9 % IV SOLN: Freq: Once | INTRAVENOUS | Status: AC

## 2020-12-23 MED ORDER — FENTANYL CITRATE (PF) 100 MCG/2ML IJ SOLN
INTRAMUSCULAR | Status: DC | PRN
  Administered 2020-12-23 (×2): 50 ug via INTRAVENOUS

## 2020-12-23 MED ORDER — CHLORHEXIDINE GLUCONATE 0.12 % MT SOLN: 15.0000 mL | Freq: Once | OROMUCOSAL | Status: AC

## 2020-12-23 MED ORDER — DEXAMETHASONE SODIUM PHOSPHATE 10 MG/ML IJ SOLN
INTRAMUSCULAR | Status: AC
  Filled 2020-12-23: qty 1

## 2020-12-23 MED ORDER — FAMOTIDINE 20 MG PO TABS: 20.0000 mg | ORAL_TABLET | Freq: Once | ORAL | Status: AC

## 2020-12-23 MED ORDER — EPHEDRINE 5 MG/ML INJ
INTRAVENOUS | Status: AC
  Filled 2020-12-23: qty 10

## 2020-12-23 MED ORDER — EPHEDRINE SULFATE 50 MG/ML IJ SOLN
INTRAMUSCULAR | Status: DC | PRN
  Administered 2020-12-23 (×2): 10 mg via INTRAVENOUS
  Administered 2020-12-23: 15 mg via INTRAVENOUS

## 2020-12-23 MED ORDER — PROPOFOL 10 MG/ML IV BOLUS
INTRAVENOUS | Status: AC
  Filled 2020-12-23: qty 20

## 2020-12-23 MED ORDER — FENTANYL CITRATE (PF) 100 MCG/2ML IJ SOLN: 25.0000 ug | INTRAMUSCULAR | Status: DC | PRN

## 2020-12-23 MED ORDER — PROPOFOL 10 MG/ML IV BOLUS
INTRAVENOUS | Status: DC | PRN
  Administered 2020-12-23: 150 mg via INTRAVENOUS

## 2020-12-23 MED ORDER — FAMOTIDINE 20 MG PO TABS
ORAL_TABLET | ORAL | Status: AC
  Administered 2020-12-23: 20 mg via ORAL
  Filled 2020-12-23: qty 1

## 2020-12-23 MED ORDER — CEFAZOLIN SODIUM-DEXTROSE 2-4 GM/100ML-% IV SOLN
INTRAVENOUS | Status: AC
  Filled 2020-12-23: qty 100

## 2020-12-23 MED ORDER — ORAL CARE MOUTH RINSE: 15.0000 mL | Freq: Once | OROMUCOSAL | Status: AC

## 2020-12-23 MED ORDER — ROCURONIUM BROMIDE 100 MG/10ML IV SOLN
INTRAVENOUS | Status: DC | PRN
  Administered 2020-12-23: 30 mg via INTRAVENOUS
  Administered 2020-12-23: 20 mg via INTRAVENOUS
  Administered 2020-12-23: 10 mg via INTRAVENOUS

## 2020-12-23 MED ORDER — FENTANYL CITRATE (PF) 100 MCG/2ML IJ SOLN
INTRAMUSCULAR | Status: AC
  Filled 2020-12-23: qty 2

## 2020-12-23 MED ORDER — ONDANSETRON HCL 4 MG/2ML IJ SOLN
INTRAMUSCULAR | Status: AC
  Filled 2020-12-23: qty 2

## 2020-12-23 MED ORDER — PHENYLEPHRINE HCL (PRESSORS) 10 MG/ML IV SOLN
INTRAVENOUS | Status: DC | PRN
  Administered 2020-12-23 (×5): 100 ug via INTRAVENOUS

## 2020-12-23 MED ORDER — ONDANSETRON HCL 4 MG/2ML IJ SOLN
INTRAMUSCULAR | Status: DC | PRN
  Administered 2020-12-23: 4 mg via INTRAVENOUS

## 2020-12-23 MED ORDER — DEXAMETHASONE SODIUM PHOSPHATE 10 MG/ML IJ SOLN
INTRAMUSCULAR | Status: DC | PRN
  Administered 2020-12-23: 10 mg via INTRAVENOUS

## 2020-12-23 MED ORDER — ONDANSETRON HCL 4 MG/2ML IJ SOLN: 4.0000 mg | Freq: Once | INTRAMUSCULAR | Status: DC | PRN

## 2020-12-23 MED ORDER — ROCURONIUM BROMIDE 10 MG/ML (PF) SYRINGE
PREFILLED_SYRINGE | INTRAVENOUS | Status: AC
  Filled 2020-12-23: qty 10

## 2020-12-23 MED ORDER — SUGAMMADEX SODIUM 200 MG/2ML IV SOLN
INTRAVENOUS | Status: DC | PRN
  Administered 2020-12-23: 200 mg via INTRAVENOUS

## 2020-12-23 MED ORDER — LACTATED RINGERS IV SOLN: INTRAVENOUS | Status: DC

## 2020-12-23 MED ORDER — LIDOCAINE HCL (PF) 2 % IJ SOLN
INTRAMUSCULAR | Status: AC
  Filled 2020-12-23: qty 5

## 2020-12-23 MED ORDER — SEVOFLURANE IN SOLN
RESPIRATORY_TRACT | Status: AC
  Filled 2020-12-23: qty 250

## 2020-12-23 MED ORDER — CHLORHEXIDINE GLUCONATE 0.12 % MT SOLN
OROMUCOSAL | Status: AC
  Administered 2020-12-23: 15 mL via OROMUCOSAL
  Filled 2020-12-23: qty 15

## 2020-12-23 MED ORDER — LIDOCAINE HCL (CARDIAC) PF 100 MG/5ML IV SOSY
PREFILLED_SYRINGE | INTRAVENOUS | Status: DC | PRN
  Administered 2020-12-23: 60 mg via INTRAVENOUS
  Administered 2020-12-23: 40 mg via INTRAVENOUS

## 2020-12-23 NOTE — Op Note (Signed)
Electromagnetic Navigation Bronchoscopy Endobronchial Ultrasound with TBNA   Indication: LEFT upper lobe lung mass/nodule, mediastinal adenopathy with positive PET/CT  Preoperative Diagnosis: LEFT upper lobe lung nodule/mass, mediastinal adenopathy Post Procedure Diagnosis: Same as above Consent: Verbal/Written  Benefits, limitations and potential complications of the procedure were discussed with the patient/family  including, but not limited to bleeding, hemoptysis, respiratory failure requiring intubation and/or prolongued mechanical ventilation, infection, pneumothorax (collapse of lung) requiring chest tube placement, stroke or even death.  Patient agreed to proceed   Hand washing performed prior to starting the procedure.   Type of Anesthesia: General endotracheal  Surgeon: Renold Don, MD Assistant/Scrub: Liborio Nixon, RRT Anesthesiologist/CRNA: None, MD/Janice Rise Patience, CRNA Fluoroscopy technician: Natale Lay, RT ROSE available ?: Yes [facility] .  Description of Procedure:  Procedure(s) Performed:  1) virtual Bronchoscopy with Multi-planar Image analysis, 3-D reconstruction of coronal, sagittal and multi-planar images for the purposes of planning real-time bronchoscopy using the iLogic Electromagnetic Navigation Bronchoscopy System (superDimension).   2) Endobronchial Ultrasound (EBUS)  3) confocal laser endomicroscopy  Description of Procedure: The patient was taken to Procedure Room 2 (Bronchoscopy Suite) appropriate timeout was taken with the staff.  Patient was placed on the superDimension table.  Patient was then inducted under general anesthesia by the anesthesia team.  She was intubated with an 8.5 ETT without difficulty.  A Portex adapter was placed on the ETT flange.  At this point the Olympus video bronchoscope was advanced through the Portex adapter and anatomic tour of the airway was performed.   The visible trachea was normal, carina was sharp,  inspection of the right lung showed no abnormalities or endobronchial lesions on the right upper lobe, right middle lobe and right lower lobe subsegments.  Inspection of the left lung showed no endobronchial lesions,on left upper lobe, lingula and lower lobe subsegments.  At this point the superDimension extended working channel and locatable guide (LG) were advanced through the working channel of the bronchoscope.The catheter/LG combo was then placed into the central portion of the trachea. The LG was directed to standard registration points at the following centers: main carina, right upper lobe bronchus, right lower lobe bronchus, right middle lobe bronchus, left upper lobe bronchus, and the left lower lobe bronchus. This data was transferred to the i-Logic ENB system for real-time bronchoscopy.  Once registration was completed, the scope was advanced until I could go no further and then the extended working channel/LG combo was navigated to the LEFT upper lobe for tissue sampling.  The position of the LG was confirmed with fluoroscopy.  At this point the LG was removed and the Cellvizio endomicroscope probe was advanced through the extended working channel and abnormal tissue was confirmed, it had the appearance of adenocarcinoma.  Once this was performed, the Endomicroscope probe was removed and and transbronchial brushings were performed.  ROSE was initially nonrevealing, additional brushings were performed a total of 2 brushes were utilized.  In similar fashion transbronchial biopsies were performed with superDimension biopsy forceps.  After biopsies were performed to super dimension 21-gauge aspiration needles were used.  1 was a regular aspirating needle and the second 1 was an arc point needle.  On the first needle pass there were cells consistent with malignancy by ROSE.  After TBNA was completed a targeted bronchoalveolar lavage was performed on the left lower lobe, 40 mL of saline were instilled  yielding approximately 8 mL of aliquot.  Once this was completed the airway was examined for hemostasis and the bronchoscope was  retrieved and exchanged for Olympus endobronchial ultrasound (EBUS) scope.  At this point the mediastinum was examined there were 2 nodes of interest, 1 was in station 4L measuring 1.6 cm the other 1 in station 11 L measuring 1.5 cm.Utilizing a Cook 22-gauge Pro Core EBUS needle transbronchial needle aspirates were performed x 4 on  the 4 L station of node.  In similar fashion lymph node station 11 L was sampled.  ROSE identified adequate sampling of lymph nodes.  Material was placed in [place].  Having completed this portion of the procedure there was minimal heme noted on the left mainstem bronchus, this was lavaged until clear.  Patient then received 10 mL of 1% lidocaine via bronchial lavage and the bronchoscope was retrieved.  The procedure was at this point terminated.  Patient was allowed to emerge from general anesthesia and was transferred to the PACU in satisfactory condition.  She tolerated the procedure well.  Intraprocedural fluoroscopy:      Specimens Obtained:  Transbronchial Fine Needle Aspirations 21G x6 (arc point needle and straight needle utilized)  Transbronchial Forceps Biopsy times: X10  Transbronchial  Brush: 2 brushes x 6 passes  BAL LEFT upper lobe: 40 mL and an approximately 8 mL out  Transbronchial needle aspirate stations 4L and 11 L: X 6   Fluoroscopy:  Fluoroscopy was utilized during the course of this procedure to assure that biopsies were taken in a safe manner under fluoroscopic guidance with spot films required.  Fluoroscopy time 4 minutes and 55 seconds total.  Complications:None  Postprocedure chest x-ray: Showed no pneumothorax:    Estimated Blood Loss: minimal less than 2 mL    Assessment and Plan/Additional Comments:  LEFT upper lobe lung nodule  Mediastinal adenopathy  Await pathology report  Patient will be  notified with  follow-up date and results  C. Derrill Kay, MD [facility]   *This note was dictated using voice recognition software/Dragon.  Despite best efforts to proofread, errors can occur which can change the meaning.  Any change was purely unintentional.

## 2020-12-23 NOTE — Transfer of Care (Signed)
Immediate Anesthesia Transfer of Care Note  Patient: Marie Hensley  Procedure(s) Performed: VIDEO BRONCHOSCOPY WITH ENDOBRONCHIAL NAVIGATION (Left ) VIDEO BRONCHOSCOPY WITH ENDOBRONCHIAL ULTRASOUND (Left )  Patient Location: PACU  Anesthesia Type:General  Level of Consciousness: drowsy and patient cooperative  Airway & Oxygen Therapy: Patient Spontanous Breathing and Patient connected to face mask oxygen  Post-op Assessment: Report given to RN and Post -op Vital signs reviewed and stable  Post vital signs: Reviewed and stable  Last Vitals:  Vitals Value Taken Time  BP 120/70 12/23/20 1502  Temp    Pulse 75 12/23/20 1502  Resp 12 12/23/20 1502  SpO2 100 % 12/23/20 1502  Vitals shown include unvalidated device data.  Last Pain:  Vitals:   12/23/20 1219  TempSrc: Tympanic  PainSc: 0-No pain         Complications: No complications documented.

## 2020-12-23 NOTE — Anesthesia Procedure Notes (Signed)
Procedure Name: Intubation Date/Time: 12/23/2020 1:07 PM Performed by: Jonna Clark, CRNA Pre-anesthesia Checklist: Patient identified, Patient being monitored, Timeout performed, Emergency Drugs available and Suction available Patient Re-evaluated:Patient Re-evaluated prior to induction Oxygen Delivery Method: Circle system utilized Preoxygenation: Pre-oxygenation with 100% oxygen Induction Type: IV induction Ventilation: Mask ventilation without difficulty Laryngoscope Size: 3 and McGraph Grade View: Grade I Tube type: Oral Tube size: 8.5 mm Number of attempts: 1 Airway Equipment and Method: Stylet Placement Confirmation: ETT inserted through vocal cords under direct vision,  positive ETCO2 and breath sounds checked- equal and bilateral Secured at: 20 cm Tube secured with: Tape Dental Injury: Teeth and Oropharynx as per pre-operative assessment

## 2020-12-23 NOTE — Discharge Instructions (Signed)

## 2020-12-23 NOTE — Anesthesia Preprocedure Evaluation (Signed)
Anesthesia Evaluation  Patient identified by MRN, date of birth, ID band Patient awake    Reviewed: Allergy & Precautions, NPO status , Patient's Chart, lab work & pertinent test results  History of Anesthesia Complications Negative for: history of anesthetic complications  Airway Mallampati: II  TM Distance: >3 FB Neck ROM: Full    Dental no notable dental hx.    Pulmonary neg sleep apnea, neg COPD, former smoker,    breath sounds clear to auscultation- rhonchi (-) wheezing      Cardiovascular Exercise Tolerance: Good hypertension, Pt. on medications (-) CAD, (-) Past MI, (-) Cardiac Stents and (-) CABG  Rhythm:Regular Rate:Normal - Systolic murmurs and - Diastolic murmurs    Neuro/Psych neg Seizures PSYCHIATRIC DISORDERS Depression negative neurological ROS     GI/Hepatic negative GI ROS, Neg liver ROS,   Endo/Other  negative endocrine ROSneg diabetes  Renal/GU CRFRenal disease     Musculoskeletal  (+) Arthritis ,   Abdominal (+) - obese,   Peds  Hematology negative hematology ROS (+)   Anesthesia Other Findings Past Medical History: No date: Arthritis No date: Dyspnea     Comment:  WITH EXERTION-SEEING DR [name] No date: HTN (hypertension) No date: Hyperlipemia   Reproductive/Obstetrics                             Anesthesia Physical Anesthesia Plan  ASA: III  Anesthesia Plan: General   Post-op Pain Management:    Induction: Intravenous  PONV Risk Score and Plan: 2 and Ondansetron and Dexamethasone  Airway Management Planned: Oral ETT  Additional Equipment:   Intra-op Plan:   Post-operative Plan: Extubation in OR  Informed Consent: I have reviewed the patients History and Physical, chart, labs and discussed the procedure including the risks, benefits and alternatives for the proposed anesthesia with the patient or authorized representative who has indicated his/her  understanding and acceptance.     Dental advisory given  Plan Discussed with: CRNA and Anesthesiologist  Anesthesia Plan Comments:         Anesthesia Quick Evaluation

## 2020-12-23 NOTE — Interval H&P Note (Signed)
History and Physical Interval Note:  12/23/2020 12:28 PM  Marie Hensley  has presented today for surgery, with the diagnosis of LEFT UPPER LOPE NODULE.  The various methods of treatment have been discussed with the patient and family. After consideration of risks, benefits and other options for treatment, the patient has consented to  Procedure(s): VIDEO BRONCHOSCOPY WITH ENDOBRONCHIAL NAVIGATION (Left) VIDEO BRONCHOSCOPY WITH ENDOBRONCHIAL ULTRASOUND (Left) as a surgical intervention.  The patient's history has been reviewed, patient examined, no change in status, stable for surgery.  I have reviewed the patient's chart and labs.  Questions were answered to the patient's satisfaction.     Vernard Gambles

## 2020-12-24 ENCOUNTER — Encounter: Payer: Self-pay | Admitting: Pulmonary Disease

## 2020-12-24 LAB — CYTOLOGY - NON PAP

## 2020-12-24 LAB — SURGICAL PATHOLOGY

## 2020-12-24 NOTE — Anesthesia Postprocedure Evaluation (Signed)
Anesthesia Post Note  Patient: Marie Hensley  Procedure(s) Performed: VIDEO BRONCHOSCOPY WITH ENDOBRONCHIAL NAVIGATION (Left ) VIDEO BRONCHOSCOPY WITH ENDOBRONCHIAL ULTRASOUND (Left )  Patient location during evaluation: PACU Anesthesia Type: General Level of consciousness: awake and alert Pain management: pain level controlled Vital Signs Assessment: post-procedure vital signs reviewed and stable Respiratory status: spontaneous breathing, nonlabored ventilation and respiratory function stable Cardiovascular status: blood pressure returned to baseline and stable Postop Assessment: no apparent nausea or vomiting Anesthetic complications: no   No complications documented.   Last Vitals:  Vitals:   12/23/20 1530 12/23/20 1543  BP: 122/67 118/73  Pulse: 68 72  Resp: (!) 8 18  Temp: (!) 36.1 C 37.1 C  SpO2: 91% 95%    Last Pain:  Vitals:   12/23/20 1543  TempSrc: Temporal  PainSc:                  Tera Mater

## 2020-12-25 ENCOUNTER — Other Ambulatory Visit
Admission: RE | Admit: 2020-12-25 | Discharge: 2020-12-25 | Disposition: A | Discharge status: Home / Self Care | Payer: Medicare HMO | Source: Ambulatory Visit | Attending: Pulmonary Disease | Admitting: Pulmonary Disease

## 2020-12-25 ENCOUNTER — Other Ambulatory Visit: Payer: Self-pay

## 2020-12-25 DIAGNOSIS — Z20822 Contact with and (suspected) exposure to covid-19: Secondary | ICD-10-CM | POA: Insufficient documentation

## 2020-12-25 DIAGNOSIS — Z01812 Encounter for preprocedural laboratory examination: Secondary | ICD-10-CM | POA: Insufficient documentation

## 2020-12-26 LAB — SARS CORONAVIRUS 2 (TAT 6-24 HRS): SARS Coronavirus 2: NEGATIVE

## 2020-12-28 ENCOUNTER — Ambulatory Visit: Payer: Medicare HMO | Attending: Pulmonary Disease

## 2020-12-28 ENCOUNTER — Other Ambulatory Visit: Payer: Self-pay

## 2020-12-28 DIAGNOSIS — R911 Solitary pulmonary nodule: Secondary | ICD-10-CM | POA: Diagnosis not present

## 2020-12-28 DIAGNOSIS — R059 Cough, unspecified: Secondary | ICD-10-CM | POA: Diagnosis present

## 2020-12-28 DIAGNOSIS — Z87891 Personal history of nicotine dependence: Secondary | ICD-10-CM | POA: Insufficient documentation

## 2020-12-28 MED ORDER — ALBUTEROL SULFATE (2.5 MG/3ML) 0.083% IN NEBU
2.5000 mg | INHALATION_SOLUTION | Freq: Once | RESPIRATORY_TRACT | Status: AC
  Administered 2020-12-28: 2.5 mg via RESPIRATORY_TRACT
  Filled 2020-12-28: qty 3

## 2020-12-29 ENCOUNTER — Other Ambulatory Visit: Payer: Self-pay

## 2020-12-29 DIAGNOSIS — R911 Solitary pulmonary nodule: Secondary | ICD-10-CM

## 2021-01-13 ENCOUNTER — Ambulatory Visit
Admission: RE | Admit: 2021-01-13 | Discharge: 2021-01-13 | Disposition: A | Discharge status: Home / Self Care | Payer: Medicare HMO | Source: Ambulatory Visit | Attending: Pulmonary Disease | Admitting: Pulmonary Disease

## 2021-01-13 ENCOUNTER — Other Ambulatory Visit: Payer: Self-pay

## 2021-01-13 DIAGNOSIS — R911 Solitary pulmonary nodule: Secondary | ICD-10-CM | POA: Diagnosis not present

## 2021-01-13 DIAGNOSIS — M47814 Spondylosis without myelopathy or radiculopathy, thoracic region: Secondary | ICD-10-CM | POA: Diagnosis not present

## 2021-01-13 DIAGNOSIS — K7689 Other specified diseases of liver: Secondary | ICD-10-CM | POA: Diagnosis not present

## 2021-01-13 DIAGNOSIS — I700 Atherosclerosis of aorta: Secondary | ICD-10-CM | POA: Diagnosis not present

## 2021-01-13 DIAGNOSIS — J8410 Pulmonary fibrosis, unspecified: Secondary | ICD-10-CM | POA: Diagnosis not present

## 2021-01-13 IMAGING — CT CT CHEST SUPER D W/O CM
2 of 5 series · 15 of 36 positions shown, 18 images · non-contrast
Comparison: [DATE] PET-CT.  [DATE] chest CT.

CLINICAL DATA: Spiculated hypermetabolic left upper lobe pulmonary
nodule status post bronchoscopic biopsy [DATE] reported as
nondiagnostic.

EXAM:
CT CHEST WITHOUT CONTRAST
TECHNIQUE: Multidetector CT imaging of the chest was performed using thin slice
collimation for electromagnetic bronchoscopy planning purposes,
without intravenous contrast.

[Series 5: thins chest 1.00 · axial · 0.80mm/px · z∈[-1191,-936]mm · 12 of 420 slices shown, 15 images]
[im 28/420  mediastinal]
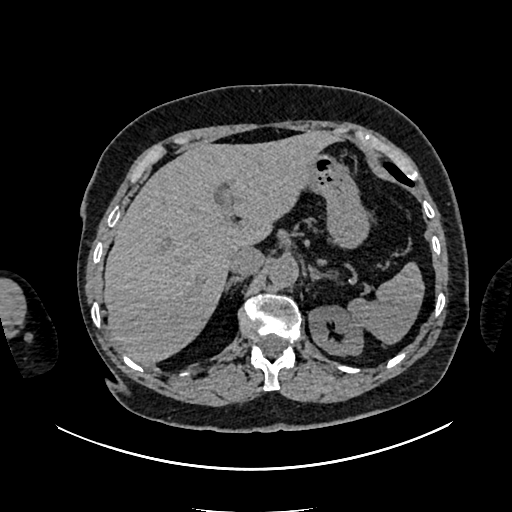
[im 28/420  lung]
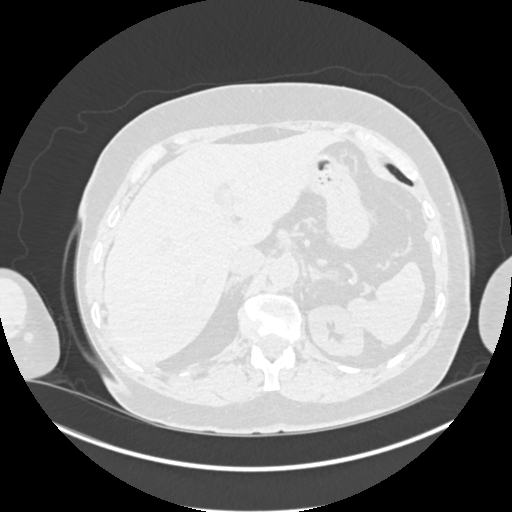
[im 56/420  lung]
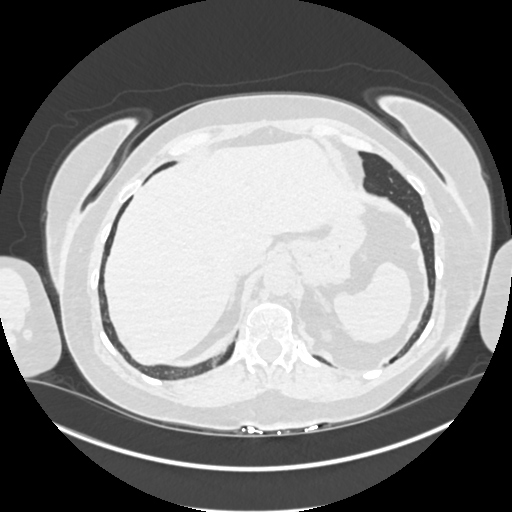
[im 84/420  lung]
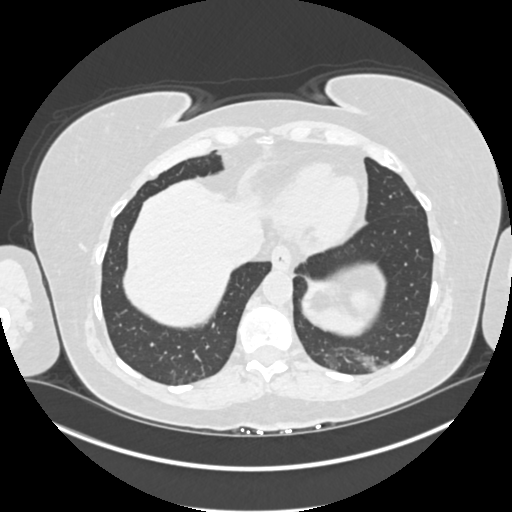
[im 140/420  lung]
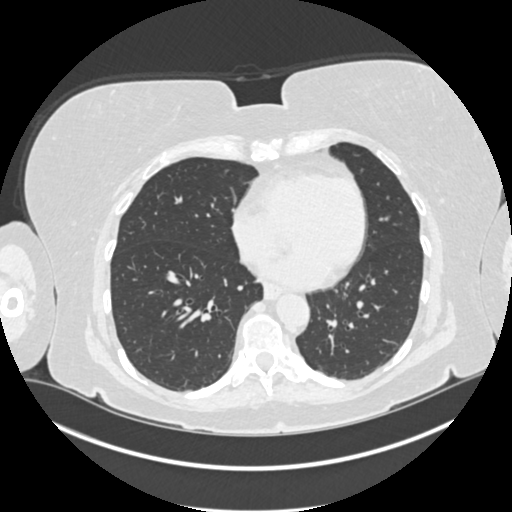
[im 168/420  mediastinal]
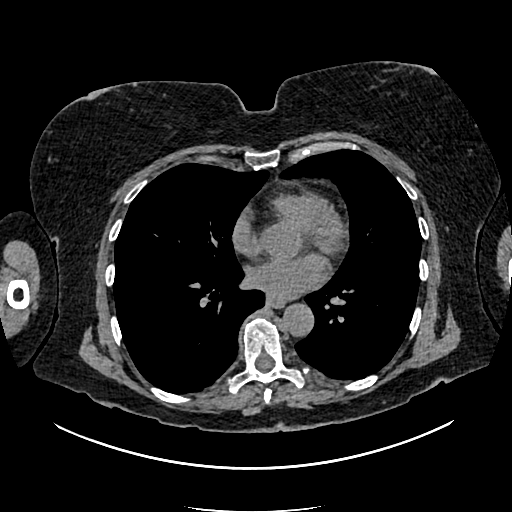
[im 168/420  lung]
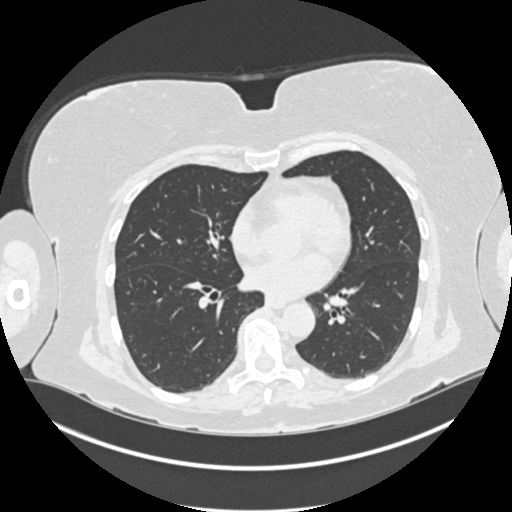
[im 196/420  lung]
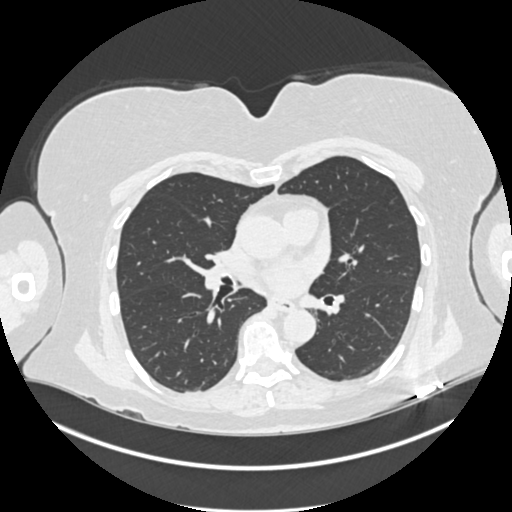
[im 224/420  lung]
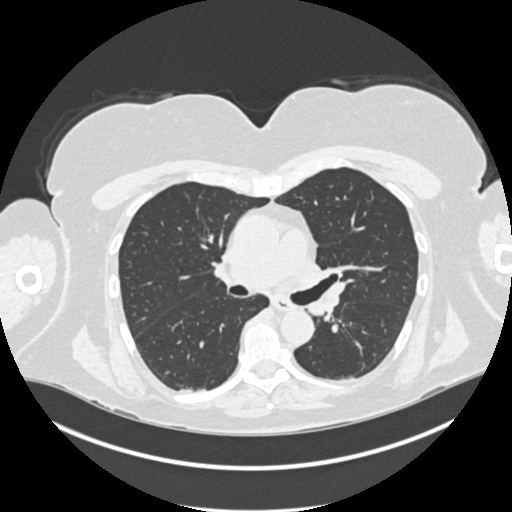
[im 252/420  lung]
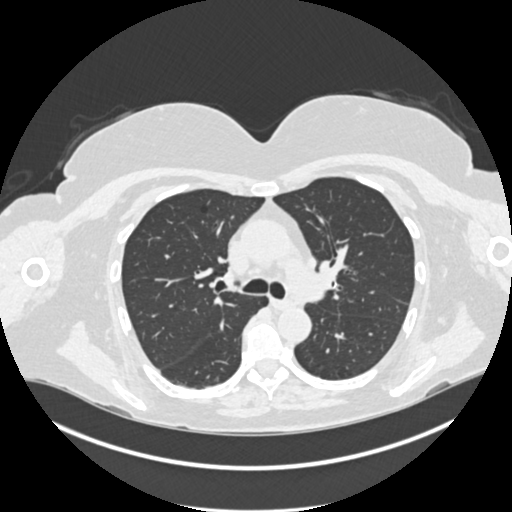
[im 280/420  mediastinal]
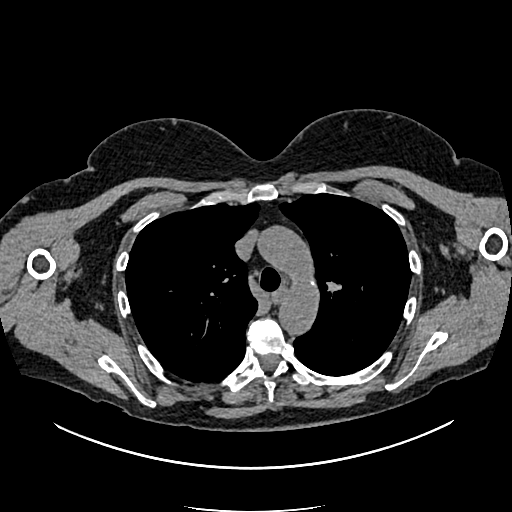
[im 280/420  lung]
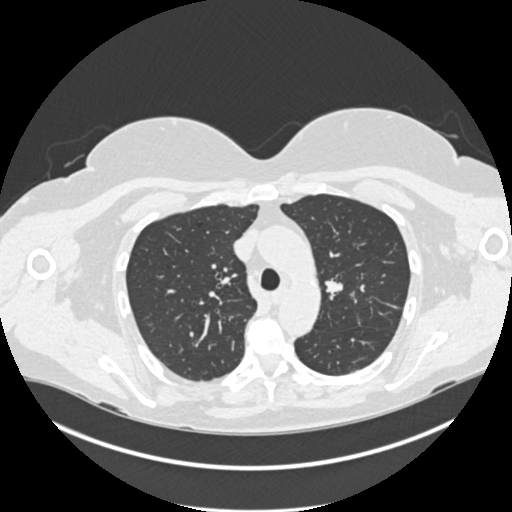
[im 336/420  lung]
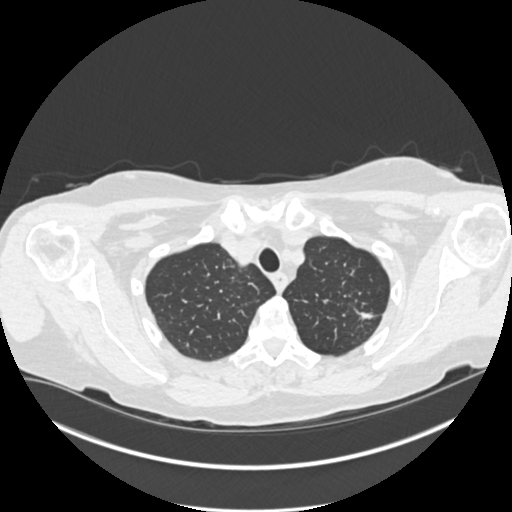
[im 364/420  lung]
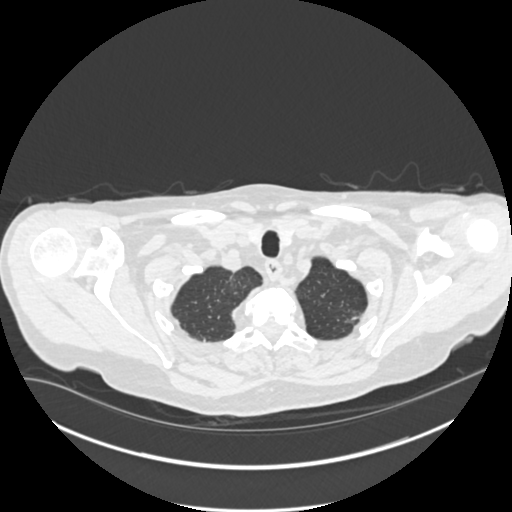
[im 392/420  lung]
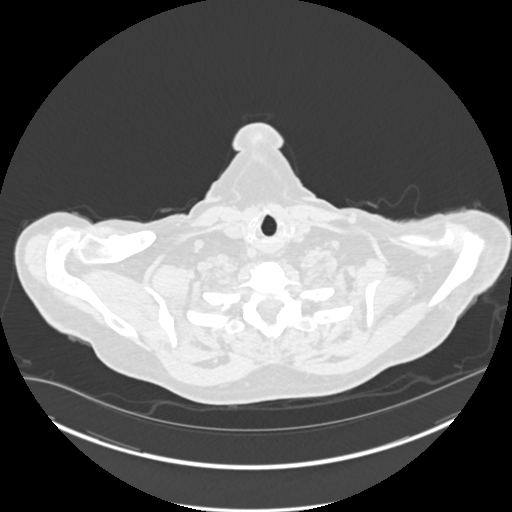

[Series 6: coronals chest 2.00 cor · coronal · 0.58mm/px · 3 of 153 slices shown]
[im 31/153  lung]
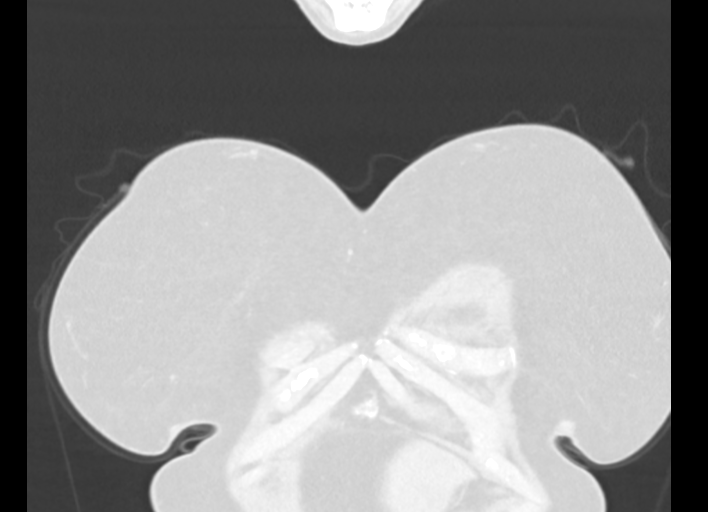
[im 61/153  lung]
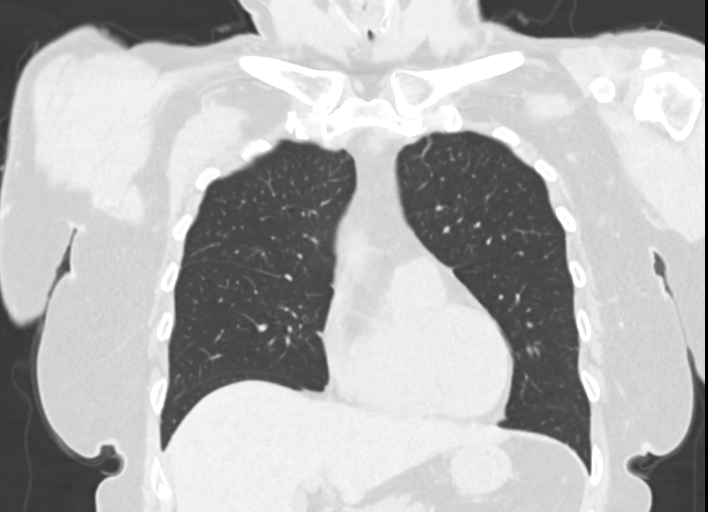
[im 92/153  lung]
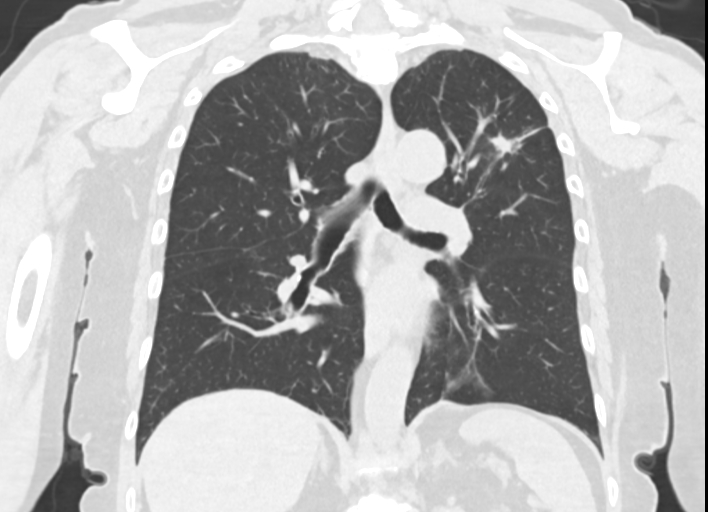

[15 of 36 positions shown; findings below may reference images not displayed]

FINDINGS: Cardiovascular: Normal heart size. No significant pericardial
effusion/thickening. Atherosclerotic nonaneurysmal thoracic aorta.
Normal caliber pulmonary arteries.

Mediastinum/Nodes: No discrete thyroid nodules. Unremarkable
esophagus. No axillary adenopathy. Newly mildly enlarged 1.1 cm left
paratracheal node (series 3/image 55). Newly mildly enlarged 1.2 cm
posterior left hilar node (series 3/image 66). No discrete right
hilar adenopathy on these noncontrast images.

Lungs/Pleura: No pneumothorax. No pleural effusion. Stable tiny
calcified anterior right lower lobe granuloma. Spiculated solid
x 1.6 cm left upper lobe pulmonary nodule (series 4/image 42),
previously 2.5 x 1.6 cm on [DATE] chest CT using similar
measurement technique, unchanged.

Upper abdomen: Subcentimeter hypodense posterior left liver lesion
is too small to characterize and is unchanged.

Musculoskeletal: No aggressive appearing focal osseous lesions.
Marked thoracic spondylosis.
IMPRESSION: 1. Spiculated solid 2.5 cm left upper lobe pulmonary nodule,
unchanged since [DATE] chest CT, which remains suspicious for
primary bronchogenic carcinoma. Repeat tissue sampling suggested.
2. New mild left paratracheal and posterior left hilar
lymphadenopathy, suspicious for nodal metastatic disease.
3. Aortic Atherosclerosis ([UZ]-[UZ]).

## 2021-01-14 ENCOUNTER — Institutional Professional Consult (permissible substitution): Payer: Medicare HMO | Admitting: Pulmonary Disease

## 2021-01-14 ENCOUNTER — Telehealth: Payer: Self-pay | Admitting: Pulmonary Disease

## 2021-01-14 NOTE — Telephone Encounter (Signed)
Received below message from Dr. [name] via epic secure message.  will await call from patient.    She will call when she can repeat bronchoscopy with robotic to LEFT upper lobe lesion

## 2021-01-18 NOTE — Telephone Encounter (Signed)
Phone pre admit visit 01/22/2021 between 8-1 and covid test 01/28/2021 [facility].  Lm for patient.

## 2021-01-18 NOTE — Telephone Encounter (Signed)
Prior Josem Kaufmann is not required for the codes [id number], [id number] Refer # [id number]

## 2021-01-18 NOTE — Telephone Encounter (Signed)
Patient is aware of below dates/times. She voiced her understanding and had no further questions.  Nothing further needed.

## 2021-01-18 NOTE — Telephone Encounter (Signed)
Patient is returning phone call. Patient phone number is [phone].

## 2021-01-18 NOTE — Telephone Encounter (Addendum)
Spoke to patient, who stated that 02/01/2021 works best with her schedule.    Robotic Bronchoscopy with navigation and EBUS is scheduled for 02/01/2021 at 12:30 IX:VEZB upper lobe lesion CPT:31653,[id number]  Rodena Piety, please see bronch info.

## 2021-01-22 ENCOUNTER — Encounter
Admission: RE | Admit: 2021-01-22 | Discharge: 2021-01-22 | Disposition: A | Discharge status: Home / Self Care | Payer: Medicare HMO | Source: Ambulatory Visit | Attending: Pulmonary Disease | Admitting: Pulmonary Disease

## 2021-01-22 ENCOUNTER — Other Ambulatory Visit: Payer: Self-pay

## 2021-01-22 ENCOUNTER — Other Ambulatory Visit: Payer: Self-pay | Admitting: Family Medicine

## 2021-01-22 DIAGNOSIS — G250 Essential tremor: Secondary | ICD-10-CM

## 2021-01-22 HISTORY — DX: Depression, unspecified: F32.A

## 2021-01-22 NOTE — Patient Instructions (Addendum)
Your procedure is scheduled on: 02/01/21 - Monday Report to the Registration Desk on the 1st floor of the [facility]. To find out your arrival time, please call [phone] between 1PM - 3PM on: 01/29/21 - Friday - [facility] To [facility] on 01/28/21 for Covid Test and Lab at 8 am. - You will need to have one lab done when you come for your Covid test.  REMEMBER: Instructions that are not followed completely may result in serious medical risk, up to and including death; or upon the discretion of your surgeon and anesthesiologist your surgery may need to be rescheduled.  Do not eat food or drink any fluids after midnight the night before surgery.  No gum chewing, lozengers or hard candies.  TAKE THESE MEDICATIONS THE MORNING OF SURGERY WITH A SIP OF WATER:  - fluticasone (FLONASE) 50 MCG/ACT nasal spray - propranolol ER (INDERAL LA) 60 MG 24 hr capsule  One week prior to surgery: Stop Anti-inflammatories (NSAIDS) such as Advil, Aleve, Ibuprofen, Motrin, Naproxen, Naprosyn and Aspirin based products such as Excedrin, Goodys Powder, BC Powder.  Stop ANY OVER THE COUNTER supplements until after surgery.  No Alcohol for 24 hours before or after surgery.  No Smoking including e-cigarettes for 24 hours prior to surgery.  No chewable tobacco products for at least 6 hours prior to surgery.  No nicotine patches on the day of surgery.  Do not use any "recreational" drugs for at least a week prior to your surgery.  Please be advised that the combination of cocaine and anesthesia may have negative outcomes, up to and including death. If you test positive for cocaine, your surgery will be cancelled.  On the morning of surgery brush your teeth with toothpaste and water, you may rinse your mouth with mouthwash if you wish. Do not swallow any toothpaste or mouthwash.  Do not wear jewelry, make-up, hairpins, clips or nail polish.  Do not wear lotions, powders, or perfumes.   Do not shave  body from the neck down 48 hours prior to surgery just in case you cut yourself which could leave a site for infection.  Also, freshly shaved skin may become irritated if using the CHG soap.  Contact lenses, hearing aids and dentures may not be worn into surgery.  Do not bring valuables to the hospital. [facility] is not responsible for any missing/lost belongings or valuables.   Notify your doctor if there is any change in your medical condition (cold, fever, infection).  Wear comfortable clothing (specific to your surgery type) to the hospital.  Plan for stool softeners for home use; pain medications have a tendency to cause constipation. You can also help prevent constipation by eating foods high in fiber such as fruits and vegetables and drinking plenty of fluids as your diet allows.  After surgery, you can help prevent lung complications by doing breathing exercises.  Take deep breaths and cough every 1-2 hours. Your doctor may order a device called an Incentive Spirometer to help you take deep breaths. When coughing or sneezing, hold a pillow firmly against your incision with both hands. This is called "splinting." Doing this helps protect your incision. It also decreases belly discomfort.  If you are being admitted to the hospital overnight, leave your suitcase in the car. After surgery it may be brought to your room.  If you are being discharged the day of surgery, you will not be allowed to drive home. You will need a responsible adult (18 years or  older) to drive you home and stay with you that night.   If you are taking public transportation, you will need to have a responsible adult (18 years or older) with you. Please confirm with your physician that it is acceptable to use public transportation.   Please call the [facility] at [phone] if you have any questions about these instructions.  Surgery Visitation Policy:  Patients undergoing a surgery or  procedure may have one family member or support person with them as long as that person is not COVID-19 positive or experiencing its symptoms.  That person may remain in the waiting area during the procedure.  Inpatient Visitation:    Visiting hours are 7 a.m. to 8 p.m. Inpatients will be allowed two visitors daily. The visitors may change each day during the patient's stay. No visitors under the age of 37. Any visitor under the age of 58 must be accompanied by an adult. The visitor must pass COVID-19 screenings, use hand sanitizer when entering and exiting the patient's room and wear a mask at all times, including in the patient's room. Patients must also wear a mask when staff or their visitor are in the room. Masking is required regardless of vaccination status.

## 2021-01-25 NOTE — Telephone Encounter (Signed)
LOV TOC on 11/19/20. No future appointments. Last filled on 12/21/20 #30 with 0 refills

## 2021-01-28 ENCOUNTER — Other Ambulatory Visit
Admission: RE | Admit: 2021-01-28 | Discharge: 2021-01-28 | Disposition: A | Discharge status: Home / Self Care | Payer: Medicare HMO | Source: Ambulatory Visit | Attending: Pulmonary Disease | Admitting: Pulmonary Disease

## 2021-01-28 ENCOUNTER — Other Ambulatory Visit: Payer: Self-pay

## 2021-01-28 DIAGNOSIS — Z20822 Contact with and (suspected) exposure to covid-19: Secondary | ICD-10-CM | POA: Diagnosis not present

## 2021-01-28 DIAGNOSIS — Z01812 Encounter for preprocedural laboratory examination: Secondary | ICD-10-CM | POA: Diagnosis not present

## 2021-01-28 LAB — SARS CORONAVIRUS 2 (TAT 6-24 HRS): SARS Coronavirus 2: NEGATIVE

## 2021-02-01 ENCOUNTER — Ambulatory Visit
Admission: RE | Admit: 2021-02-01 | Discharge: 2021-02-01 | Disposition: A | Discharge status: Home / Self Care | Payer: Medicare HMO | Attending: Pulmonary Disease | Admitting: Pulmonary Disease

## 2021-02-01 ENCOUNTER — Ambulatory Visit: Payer: Medicare HMO | Admitting: Urgent Care

## 2021-02-01 ENCOUNTER — Encounter: Payer: Self-pay | Admitting: Pulmonary Disease

## 2021-02-01 ENCOUNTER — Encounter
Admission: RE | Disposition: A | Discharge status: Home / Self Care | Payer: Self-pay | Source: Home / Self Care | Attending: Pulmonary Disease

## 2021-02-01 ENCOUNTER — Ambulatory Visit: Payer: Medicare HMO

## 2021-02-01 ENCOUNTER — Telehealth: Payer: Self-pay | Admitting: Pulmonary Disease

## 2021-02-01 ENCOUNTER — Other Ambulatory Visit: Payer: Self-pay

## 2021-02-01 DIAGNOSIS — Z79899 Other long term (current) drug therapy: Secondary | ICD-10-CM | POA: Insufficient documentation

## 2021-02-01 DIAGNOSIS — Z9889 Other specified postprocedural states: Secondary | ICD-10-CM

## 2021-02-01 DIAGNOSIS — R918 Other nonspecific abnormal finding of lung field: Secondary | ICD-10-CM | POA: Diagnosis not present

## 2021-02-01 DIAGNOSIS — D381 Neoplasm of uncertain behavior of trachea, bronchus and lung: Secondary | ICD-10-CM | POA: Diagnosis not present

## 2021-02-01 DIAGNOSIS — R911 Solitary pulmonary nodule: Secondary | ICD-10-CM

## 2021-02-01 DIAGNOSIS — R590 Localized enlarged lymph nodes: Secondary | ICD-10-CM

## 2021-02-01 DIAGNOSIS — Z87891 Personal history of nicotine dependence: Secondary | ICD-10-CM | POA: Diagnosis not present

## 2021-02-01 HISTORY — PX: VIDEO BRONCHOSCOPY WITH ENDOBRONCHIAL NAVIGATION: SHX6175

## 2021-02-01 HISTORY — PX: VIDEO BRONCHOSCOPY WITH ENDOBRONCHIAL ULTRASOUND: SHX6177

## 2021-02-01 LAB — APTT: aPTT: 27 seconds (ref 24–36)

## 2021-02-01 LAB — POCT I-STAT, CHEM 8
BUN: 46 mg/dL — ABNORMAL HIGH (ref 8–23)
Calcium, Ion: 1.25 mmol/L (ref 1.15–1.40)
Chloride: 105 mmol/L (ref 98–111)
Creatinine, Ser: 1.6 mg/dL — ABNORMAL HIGH (ref 0.44–1.00)
Glucose, Bld: 129 mg/dL — ABNORMAL HIGH (ref 70–99)
HCT: 40 % (ref 36.0–46.0)
Hemoglobin: 13.6 g/dL (ref 12.0–15.0)
Potassium: 4.1 mmol/L (ref 3.5–5.1)
Sodium: 137 mmol/L (ref 135–145)
TCO2: 23 mmol/L (ref 22–32)

## 2021-02-01 LAB — PROTIME-INR
INR: 1 (ref 0.8–1.2)
Prothrombin Time: 12.7 seconds (ref 11.4–15.2)

## 2021-02-01 IMAGING — DX DG CHEST 1V PORT
1 series · 1 of 1 positions shown · non-contrast
Comparison: Chest CT [DATE]

CLINICAL DATA: Status post biopsy.

EXAM:
PORTABLE CHEST 1 VIEW

[chest ap]
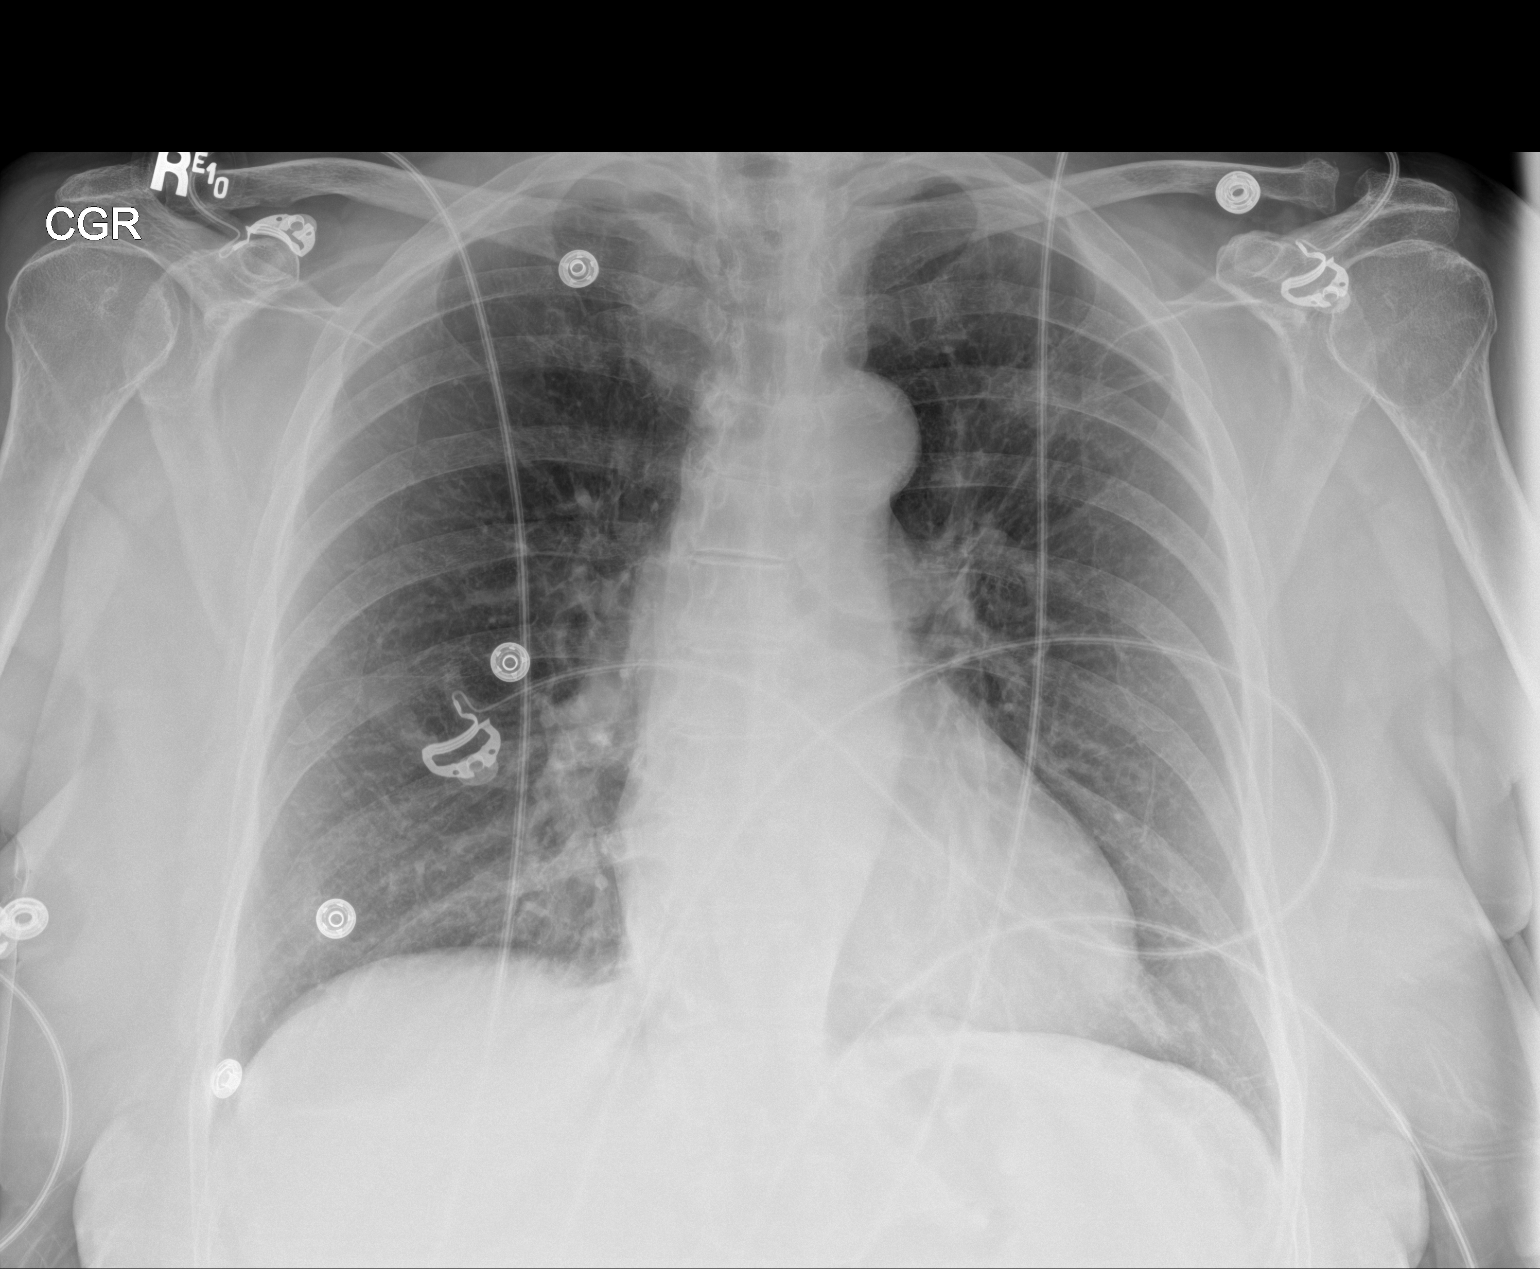

[1 of 1 positions shown; findings below may reference images not displayed]

FINDINGS: No evidence of pneumothorax post biopsy. Streaky left suprahilar
opacity with vague nodular density corresponding to nodule on prior
CT. No acute airspace disease or pleural effusion. Normal heart
size. Adenopathy on recent CT not appreciated by radiograph.
IMPRESSION: 1. No evidence of pneumothorax post biopsy.
2. Streaky left suprahilar opacity with vague nodular density
corresponding to nodule on prior chest CT.

## 2021-02-01 SURGERY — VIDEO BRONCHOSCOPY WITH ENDOBRONCHIAL NAVIGATION
Anesthesia: General | Laterality: Left

## 2021-02-01 MED ORDER — IPRATROPIUM-ALBUTEROL 0.5-2.5 (3) MG/3ML IN SOLN
RESPIRATORY_TRACT | Status: AC
  Administered 2021-02-01: 3 mL via RESPIRATORY_TRACT
  Filled 2021-02-01: qty 3

## 2021-02-01 MED ORDER — PROPOFOL 10 MG/ML IV BOLUS
INTRAVENOUS | Status: DC | PRN
  Administered 2021-02-01: 120 mg via INTRAVENOUS

## 2021-02-01 MED ORDER — ROCURONIUM BROMIDE 100 MG/10ML IV SOLN
INTRAVENOUS | Status: DC | PRN
  Administered 2021-02-01: 30 mg via INTRAVENOUS
  Administered 2021-02-01: 60 mg via INTRAVENOUS
  Administered 2021-02-01: 10 mg via INTRAVENOUS

## 2021-02-01 MED ORDER — PHENYLEPHRINE HCL (PRESSORS) 10 MG/ML IV SOLN
INTRAVENOUS | Status: DC | PRN
  Administered 2021-02-01: 100 ug via INTRAVENOUS

## 2021-02-01 MED ORDER — OXYCODONE HCL 5 MG/5ML PO SOLN: 5.0000 mg | Freq: Once | ORAL | Status: DC | PRN

## 2021-02-01 MED ORDER — FENTANYL CITRATE (PF) 100 MCG/2ML IJ SOLN: 25.0000 ug | INTRAMUSCULAR | Status: DC | PRN

## 2021-02-01 MED ORDER — OXYCODONE HCL 5 MG PO TABS
5.0000 mg | ORAL_TABLET | Freq: Once | ORAL | Status: DC | PRN
Start: 2021-02-01 — End: 2021-02-01

## 2021-02-01 MED ORDER — FENTANYL CITRATE (PF) 100 MCG/2ML IJ SOLN
INTRAMUSCULAR | Status: DC | PRN
  Administered 2021-02-01: 50 ug via INTRAVENOUS
  Administered 2021-02-01 (×2): 25 ug via INTRAVENOUS

## 2021-02-01 MED ORDER — LIDOCAINE HCL (CARDIAC) PF 100 MG/5ML IV SOSY
PREFILLED_SYRINGE | INTRAVENOUS | Status: DC | PRN
  Administered 2021-02-01: 100 mg via INTRAVENOUS

## 2021-02-01 MED ORDER — SODIUM CHLORIDE 0.9 % IV SOLN
INTRAVENOUS | Status: DC | PRN
  Administered 2021-02-01: 50 ug/min via INTRAVENOUS

## 2021-02-01 MED ORDER — SUCCINYLCHOLINE CHLORIDE 20 MG/ML IJ SOLN
INTRAMUSCULAR | Status: DC | PRN
  Administered 2021-02-01: 120 mg via INTRAVENOUS

## 2021-02-01 MED ORDER — IPRATROPIUM-ALBUTEROL 0.5-2.5 (3) MG/3ML IN SOLN: 3.0000 mL | Freq: Once | RESPIRATORY_TRACT | Status: AC

## 2021-02-01 MED ORDER — GLYCOPYRROLATE 0.2 MG/ML IJ SOLN
INTRAMUSCULAR | Status: DC | PRN
  Administered 2021-02-01: .2 mg via INTRAVENOUS

## 2021-02-01 MED ORDER — SODIUM CHLORIDE 0.9 % IV SOLN: INTRAVENOUS | Status: DC | PRN

## 2021-02-01 MED ORDER — DEXAMETHASONE SODIUM PHOSPHATE 10 MG/ML IJ SOLN
INTRAMUSCULAR | Status: DC | PRN
  Administered 2021-02-01: 10 mg via INTRAVENOUS

## 2021-02-01 MED ORDER — SODIUM CHLORIDE 0.9 % IV SOLN: Freq: Once | INTRAVENOUS | Status: AC

## 2021-02-01 MED ORDER — ONDANSETRON HCL 4 MG/2ML IJ SOLN
INTRAMUSCULAR | Status: DC | PRN
  Administered 2021-02-01: 4 mg via INTRAVENOUS

## 2021-02-01 NOTE — Discharge Instructions (Signed)
Flexible Bronchoscopy  Flexible bronchoscopy is a procedure used to examine the passageways in the lungs. During the procedure, a thin, flexible tool with a camera (bronchoscope) is passed into the mouth or nose, down through the windpipe (trachea), and into the air tubes in the lungs (bronchi). This tool allows the health care provider to look inside the lungs and to take samples for testing, if needed. Tell a health care provider about:  Any allergies you have.  All medicines you are taking, including vitamins, herbs, eye drops, creams, and over-the-counter medicines.  Any problems you or family members have had with anesthetic medicines.  Any blood disorders you have.  Any surgeries you have had.  Any medical conditions you have.  Whether you are pregnant or may be pregnant. What are the risks? Generally, this is a safe procedure. However, problems may occur, including:  Infection.  Bleeding.  Damage to other structures or organs.  Allergic reactions to medicines.  Collapsed lung (pneumothorax).  Increased need for oxygen or difficulty breathing after the procedure. What happens before the procedure? Staying hydrated Follow instructions from your health care provider about hydration, which may include:  Up to 2 hours before the procedure - you may continue to drink clear liquids, such as water, clear fruit juice, black coffee, and plain tea.   Eating and drinking restrictions Follow instructions from your health care provider about eating and drinking, which may include:  8 hours before the procedure - stop eating heavy meals or foods, such as meat, fried foods, or fatty foods.  6 hours before the procedure - stop eating light meals or foods, such as toast or cereal.  6 hours before the procedure - stop drinking milk or drinks that contain milk.  2 hours before the procedure - stop drinking clear liquids. Medicines Ask your health care provider about:  Changing or  stopping your regular medicines. This is especially important if you are taking diabetes medicines or blood thinners.  Taking medicines such as aspirin and ibuprofen. These medicines can thin your blood. Do not take these medicines unless your health care provider tells you to take them.  Taking over-the-counter medicines, vitamins, herbs, and supplements. General instructions  You may be given antibiotic medicine to help lower the risk of infection.  Plan to have a responsible adult take you home from the hospital or clinic.  If you will be going home right after the procedure, plan to have a responsible adult care for you for the time you are told. This is important. What happens during the procedure?  An IV will be inserted into one of your veins.  You will be given a medicine (local anesthetic) to numb your mouth, nose, throat, and voice box (larynx). You may also be given one or more of the following: ? A medicine to help you relax (sedative). ? A medicine to control coughing. ? A medicine to dry up any fluids or secretions in your lungs.  A bronchoscope will be passed into your nose or mouth, and into your lungs. Your health care provider will examine your lungs.  Samples of airway secretions may be collected for testing.  If abnormal areas are seen in your airways, samples of tissue may be removed and checked under a microscope (biopsy).  If tissue samples are needed from the outer parts of the lung, a type of X-ray (fluoroscopy) may be used to guide the bronchoscope to these areas.  If bleeding occurs, you may be given medicine to  stop or decrease the bleeding. The procedure may vary among health care providers and hospitals. What can I expect after the procedure?  Your blood pressure, heart rate, breathing rate, and blood oxygen level will be monitored until you leave the hospital or clinic.  You may have a chest X-ray to check for signs of pneumothorax.  You willnot be  allowed to eat or drink anything for 2 hours after your procedure.  If a biopsy was taken, it is up to you to get the results of the test. Ask your health care provider, or the department that is doing the procedure, when your results will be ready.  You may have the following symptoms for 24-48 hours: ? A cough that is worse than it was before the procedure. ? A low-grade fever. ? A sore throat or hoarse voice. ? Some blood in the mucus from your lungs (sputum), if a biopsy was done. Follow these instructions at home: Eating and drinking  Do not eat or drink anything, including water, for 2 hours after your procedure, or until your numbing medicine has worn off. Having a numb throat increases your risk of burning yourself or choking.  Start eating soft foods and slowly drinking liquids after your numbness is gone and your cough and gag reflexes have returned.  You may return to your normal diet the day after the procedure. Driving  If you were given a sedative during the procedure, it can affect you for several hours. Do not drive or operate machinery until your health care provider says that it is safe.  Ask your health care provider if the medicine prescribed to you requires you to avoid driving or using machinery.  Return to your normal activities as told by your health care provider. Ask your health care provider what activities are safe for you. General instructions  Take over-the-counter and prescription medicines only as told by your health care provider.  Do not use any products that contain nicotine or tobacco. These products include cigarettes, chewing tobacco, and vaping devices, such as e-cigarettes. If you need help quitting, ask your health care provider.  Keep all follow-up visits. This is important.   Get help right away if:  You have shortness of breath that gets worse.  You become light-headed or feel like you might faint.  You have chest pain.  You cough up  more than a small amount of blood. These symptoms may represent a serious problem that is an emergency. Do not wait to see if the symptoms will go away. Get medical help right away. Call your local emergency services (911 in the [place] Do not drive yourself to the hospital. Summary  Flexible bronchoscopy is a procedure that allows your health care provider to look closely inside your lungs and to take testing samples if needed.  Risks of flexible bronchoscopy include bleeding, infection, and collapsed lung (pneumothorax).  Before the procedure, you will be given a medicine to numb your mouth, nose, throat, and voice box. Then, a bronchoscope will be passed into your nose or mouth, and into your lungs.  After the procedure, your blood pressure, heart rate, breathing rate, and blood oxygen level will be monitored until you leave the hospital or clinic. You may have a chest X-ray to check for signs of pneumothorax.  You will not be allowed to eat or drink anything for 2 hours after your procedure. This information is not intended to replace advice given to you by your health care provider.  Make sure you discuss any questions you have with your health care provider. Document Revised: 03/19/2020 Document Reviewed: 03/19/2020 Elsevier Patient Education  2021 [place]   1) The drugs that you were given will stay in your system until tomorrow so for the next 24 hours you should not:  A) Drive an automobile B) Make any legal decisions C) Drink any alcoholic beverage   2) You may resume regular meals tomorrow.  Today it is better to start with liquids and gradually work up to solid foods.  You may eat anything you prefer, but it is better to start with liquids, then soup and crackers, and gradually work up to solid foods.   3) Please notify your doctor immediately if you have any unusual bleeding, trouble breathing, redness and pain at the  surgery site, drainage, fever, or pain not relieved by medication.    4) Additional Instructions:        Please contact your physician with any problems or Same Day Surgery at [phone], Monday through Friday 6 am to 4 pm, or [place] at [facility] number at [phone].

## 2021-02-01 NOTE — Transfer of Care (Signed)
Immediate Anesthesia Transfer of Care Note  Patient: Marie Hensley  Procedure(s) Performed: ROBOTIC ASSISTED VIDEO BRONCHOSCOPY WITH ENDOBRONCHIAL NAVIGATION (Left ) ROBOTIC ASSITSTED VIDEO BRONCHOSCOPY WITH ENDOBRONCHIAL ULTRASOUND (Left )  Patient Location: PACU  Anesthesia Type:General  Level of Consciousness: awake, alert  and oriented  Airway & Oxygen Therapy: Patient Spontanous Breathing and Patient connected to face mask oxygen  Post-op Assessment: Report given to RN and Post -op Vital signs reviewed and stable  Post vital signs: Reviewed and stable  Last Vitals:  Vitals Value Taken Time  BP 118/67 02/01/21 1530  Temp 36 C 02/01/21 1525  Pulse 69 02/01/21 1531  Resp 16 02/01/21 1531  SpO2 100 % 02/01/21 1531  Vitals shown include unvalidated device data.  Last Pain:  Vitals:   02/01/21 1525  TempSrc:   PainSc: 0-No pain         Complications: No complications documented.

## 2021-02-01 NOTE — Interval H&P Note (Signed)
History and Physical Interval Note:  02/01/2021 11:51 AM  Marie Hensley  has presented today for surgery, with the diagnosis of LEFT UPPER LOBE LESION.  The various methods of treatment have been discussed with the patient and family. After consideration of risks, benefits and other options for treatment, the patient has consented to  Procedure(s): ROBOTIC ASSISTED VIDEO BRONCHOSCOPY WITH ENDOBRONCHIAL NAVIGATION (Left) ROBOTIC ASSITSTED VIDEO BRONCHOSCOPY WITH ENDOBRONCHIAL ULTRASOUND (Left) as a surgical intervention.  The patient's history has been reviewed, patient examined, no change in status, stable for surgery.  I have reviewed the patient's chart and labs.  Questions were answered to the patient's satisfaction.     Renold Don, MD [facility]

## 2021-02-01 NOTE — Op Note (Signed)
Bronchoscopy with Robotic Navigation Assistance Endobronchial ultrasound with TBNA Cellvizio Probe Based Confocal Laser Endomicroscopy (pCLE)   Indication: Left upper lobe lung mass, mediastinal adenopathy, prior negative ENB (superDimension)  Preoperative Diagnosis: Left upper lobe lung mass, mediastinal adenopathy, rule out cancer Post Procedure Diagnosis: Same as above  Consent: Verbal/Written Benefits, limitations and potential complications of the procedure were discussed with the patient/family  including, but not limited to bleeding, hemoptysis, respiratory failure requiring intubation and/or prolongued mechanical ventilation, infection, pneumothorax (collapse of lung) requiring chest tube placement, stroke or even death.  Type of Anesthesia: General endotracheal with monitoring protocols  Surgeon: Renold Don, MD Assistant/Scrub: Sullivan Lone, RRT Circulator: Annia Belt, RRT Anesthesiologist/CRNA: Phill Mutter, MD/Mark Neldon Mc, CRNA Cytotechnology assistance: [facility] Fluoroscopy technician: Waverly Ferrari, RT Auris Monarch/[employer] representative: Edwena Felty   Procedure Performed: Procedure consists of robotic navigation comprised of electromagnetics, optical pattern recognition and robotic kinematic data - to triangulate bronchoscope location during the procedure and provide accurate positional data to biopsy a lesion.    Description of Procedure: The patient was taken to Procedure Room 2 in the OR area (Bronchoscopy Suite).  Appropriate timeout was taken with the staff.  The patient was then inducted under general anesthesia by the anesthesia team.  Was intubated with an 8.5 ETT without difficulty.  A Portex adapter was placed over the existing ET tube.  The Olympus therapeutic video bronchoscope was then advanced through the Portex adapter into the endotracheal tube and advanced into the airways for anatomic tour.  Visible portions of the trachea appeared unremarkable  carina was sharp. No secretions were seen in either right or left mainstem bronchi.  There were no endobronchial lesions in either the right or left mainstem bronchi. The RUL, RLL, RML appeared to be free of endobronchial masses, lesions, or purulent secretions. Likewise, the LLL/LUL appeared to be free of endobronchial masses, lesions, or purulent secretions.  At this point the bronchoscope was retrieved and the shunt was set up for insertion of the [facility] robotic bronchoscope.  ET tube was positioned 4 cm above the carina and it was cut at the level of the nose and taped securely at the midline.  The [facility] robotic bronchoscope introducer was then secured to the Portex adapter/introducer.  The robotic platform was then aligned with the introducer.  Once this was aligned the robotic scope was attached to the robotic arms.  The scope was then advanced through the ET tube introducer.  At this point utilizing the joystick controller I introduced the robotic bronchoscope through the endotracheal tube and successfully completed registration with good correlation between robotic mapping and bronchoscopic imaging.  Utilizing fusion navigation the bronchoscope was then advanced to the left upper lobe to the location of the mass.  The tip of the working channel sat within 0.2 cm of the mass.  Positioning was confirmed with fluoroscopy and with Cellvizio probe based confocal laser endomicroscopy (pCLE).  Utilizing endomicroscopy the lesion could be seen have on.  At this point 4 passes with a  superDimension Arcpoint needle were performed under fluoroscopic guidance.  ROSE yielded only inflammatory cells.  At this point fine-tuning of the positioning of the target and bronchoscope was performed and brushings x4 were performed and again inflammatory cells were noted.  Cellvizio probe based confocal laser endomicroscopy (pCLE) confirmed the trajectory of the tools to be at the center of the lesion and abnormal tissue.  At  this point biopsies were obtained with fluoroscopic guidance.  A total of 6 biopsies were  obtained.  After confirmation of hemostasis the scope was withdrawn completely out and the robotic arms disengaged.  An Olympus endobronchial ultrasound scope was then introduced via the endotracheal tube and complete lymph node ultrasound survey was performed.  The lymph node with FDG activity on PET/CT was in station 11 L this lymph node appeared small on imaging as well as by ultrasound.  By ultrasound it measured approximately 1.3 cm in diameter.  EBUS directed FNA biopsies were then obtained at the 11 L station with a Cook 25-gauge EBUS needle.  This needle however bent on expulsion of the specimen into the CytoLyt material.  This was then exchanged for a Olympus VIzI shot 21-gauge EBUS needle.  ROSE verified lymphoid tissue.  Additionally 4 more passes were performed for a total of 6 passes and placed into CytoLyt for analysis.  I confirmed hemostasis again and withdrew the scope all the way out.  At this point the patient was allowed to emerge from general anesthesia and she was extubated in the procedure room without sequela.  She was transferred to the PACU in satisfactory condition.  The patient tolerated the procedure well.   Specimens Obtained:  EBUS TBNA 21-gauge: X5 EBUS TBNA 25-gauge: X1 (needle bent)  Peripheral transbronchial forceps Biopsy: X6  Peripheral transbronchial  bush: X4  Transbronchial Needle 1 lobe: X4  Fluoroscopy:  Fluoroscopy was utilized during the course of this procedure to assure that biopsies were taken in a safe manner under fluoroscopic guidance with spot films required.  Fluoroscopy time 3 minutes and 18 seconds.  Fluoroscopic images:        Complications:None apparent.  Procedure chest x-ray showed no pneumothorax:     Estimated Blood Loss: minimal approx less than 2 mL  Assessment and Plan/Additional Comments:   Left upper lobe mass, rule out cancer    Mediastinal adenopathy rule out metastatic disease  Status post biopsy left upper lobe  Status post mediastinal lymph node sampling  Await pathology reports.   Renold Don, MD  PCCM   *This note was dictated using voice recognition software/Dragon.  Despite best efforts to proofread, errors can occur which can change the meaning.  Any change was purely unintentional.

## 2021-02-01 NOTE — H&P (Signed)
Marie Hensley is an 75 y.o. female.   Chief Complaint: Left upper lobe nodule HPI:  Patient is a 75 year old former smoker (quit 1991) valuation of a spiculated 2.9 x 1.8 cm left upper lobe nodule incidentally noted on chest x-ray on 20 November 2020.  She is a patient of Dr. Janett Billow Cody's.  The chest x-ray was followed by a chest CT that noted the spiculated lesion with a very small mediastinal node that does not meet malignancy criteria however, requires further evaluation.  The patient states that noticed any weight loss or anorexia.  No cough or sputum production and no hemoptysis.  She has noted some fatigue for approximately 3 months.  She has also noticed some dyspnea on exertion.  A chest x-ray was performed at that time with a concern for potential CHF.  The chest x-ray was clear however, it did show an opacity on the left upper lobe.  It was followed up by the CT.  The finding was confirmed by CT as noted above.  Incidentally the patient had a 2D echo performed on 24 November 2020 that showed normal LVEF of 60 to 65%, normal LV function.  No regional wall motion abnormalities and DD grade I.  No evidence of valvular disease.  She underwent bronchoscopy with navigational assistance on 14 December 2020.  Unfortunately this reactive bronchial cells he had left hilar adenopathy and this was sampled however it was benign.  The sampling that showed lymphoid tissue.  This is PET avid.  She had repeat CT chest on 13 Jan 2021 persistence of the left upper lobe pulmonary nodule change from prior.  Left hilar lymph nodes remain small but were noted on the CT.  She presents today for bronchoscopy with robotic navigational assistance for definitive diagnosis.    Past Medical History:  Diagnosis Date  . Arthritis   . Depression   . Dyspnea    WITH EXERTION-SEEING DR [name]  . HTN (hypertension)   . Hyperlipemia     Past Surgical History:  Procedure Laterality Date  . APPENDECTOMY  1982  . EYE SURGERY  Bilateral    CATARACTS  . OOPHORECTOMY Right   . VIDEO BRONCHOSCOPY WITH ENDOBRONCHIAL NAVIGATION Left 12/23/2020   Procedure: VIDEO BRONCHOSCOPY WITH ENDOBRONCHIAL NAVIGATION;  Surgeon: Tyler Pita, MD;  Location: [facility] ORS;  Service: Pulmonary;  Laterality: Left;  Marland Kitchen VIDEO BRONCHOSCOPY WITH ENDOBRONCHIAL ULTRASOUND Left 12/23/2020   Procedure: VIDEO BRONCHOSCOPY WITH ENDOBRONCHIAL ULTRASOUND;  Surgeon: Tyler Pita, MD;  Location: [facility] ORS;  Service: Pulmonary;  Laterality: Left;    Family History  Problem Relation Age of Onset  . Addison's disease Daughter   . Hypertension Mother        died at 60  . Alcohol abuse Father    Social History:  reports that she quit smoking about 31 years ago. Her smoking use included cigarettes. She has a 10.00 pack-year smoking history. She has never used smokeless tobacco. She reports current alcohol use. She reports that she does not use drugs.  Allergies: No Known Allergies  Medications Prior to Admission  Medication Sig Dispense Refill  . acetaminophen (TYLENOL) 500 MG tablet Take 1,000 mg by mouth every 6 (six) hours as needed for mild pain (Arthritis).    Marland Kitchen atorvastatin (LIPITOR) 40 MG tablet Take 1 tablet (40 mg total) by mouth daily. (Patient taking differently: Take 40 mg by mouth at bedtime.) 90 tablet 3  . Cetirizine HCl (ZYRTEC PO) Take 10 mg by mouth daily as needed (  Allergy).    . citalopram (CELEXA) 20 MG tablet TAKE ONE TABLET BY MOUTH DAILY (Patient taking differently: Take 20 mg by mouth at bedtime.) 90 tablet 1  . fluticasone (FLONASE) 50 MCG/ACT nasal spray Place 1 spray into both nostrils daily.    Marland Kitchen lisinopril-hydrochlorothiazide (ZESTORETIC) 10-12.5 MG tablet Take 1 tablet by mouth daily. (Patient taking differently: Take 1 tablet by mouth at bedtime.) 90 tablet 3  . Loperamide HCl (IMODIUM PO) Take 2 mg by mouth daily as needed (Diarrhea).    . Multiple Vitamin (MULTIVITAMIN WITH MINERALS) TABS tablet Take 1 tablet by  mouth daily.    . propranolol ER (INDERAL LA) 60 MG 24 hr capsule TAKE ONE CAPSULE BY MOUTH ONE TIME DAILY AT BEDTIME FOR TREMORS 30 capsule 1    Results for orders placed or performed during the hospital encounter of 02/01/21 (from the past 48 hour(s))  Protime-INR     Status: None   Collection Time: 02/01/21 11:19 AM  Result Value Ref Range   Prothrombin Time 12.7 11.4 - 15.2 seconds   INR 1.0 0.8 - 1.2    Comment: (NOTE) INR goal varies based on device and disease states. Performed at [facility], [address] [place], [place] [address]   APTT     Status: None   Collection Time: 02/01/21 11:19 AM  Result Value Ref Range   aPTT 27 24 - 36 seconds    Comment: Performed at [facility], [address] [place], [place] [address]  I-STAT, [place] [address]     Status: Abnormal   Collection Time: 02/01/21 11:24 AM  Result Value Ref Range   Sodium 137 135 - 145 mmol/L   Potassium 4.1 3.5 - 5.1 mmol/L   Chloride 105 98 - 111 mmol/L   BUN 46 (H) 8 - 23 mg/dL   Creatinine, Ser 1.60 (H) 0.44 - 1.00 mg/dL   Glucose, Bld 129 (H) 70 - 99 mg/dL    Comment: Glucose reference range applies only to samples taken after fasting for at least 8 hours.   Calcium, Ion 1.25 1.15 - 1.40 mmol/L   TCO2 23 22 - 32 mmol/L   Hemoglobin 13.6 12.0 - 15.0 g/dL   HCT 40.0 36.0 - 46.0 %   No results found.  Review of Systems  A 10 point review of systems was performed and it is as noted above otherwise negative.  Blood pressure 122/77, pulse (!) 58, temperature (!) 97.2 F (36.2 C), temperature source Oral, resp. rate 16, height 5\' 4"  (1.626 m), weight 76.2 kg, SpO2 98 %. Physical Exam  GENERAL: Well-developed somewhat overweight woman, no acute distress, fully ambulatory.  Speech is fluent. HEAD: Normocephalic, atraumatic.  EYES: Pupils equal, round, reactive to light.  No scleral icterus.  MOUTH: Nose/mouth/throat not examined due to masking requirements for COVID 19. NECK: Supple. No  thyromegaly. Trachea midline. No JVD.  No adenopathy. PULMONARY: Good air entry bilaterally.  No adventitious sounds. CARDIOVASCULAR: S1 and S2. Regular rate and rhythm.  No rubs, murmurs or gallops heard. ABDOMEN: Benign. MUSCULOSKELETAL: No joint deformity, no clubbing, no edema.  NEUROLOGIC: No focal deficit, no gait disturbance, speech is fluent. SKIN: Intact,warm,dry.  On limited exam no rashes. PSYCH: Mood and behavior normal.  Assessment/Plan Left upper lobe pulmonary nodule 2.9 cm x 1.8 cm FDG avid Carcinoma until proven otherwise Patient to undergo robotic assisted bronchoscopy for diagnosis  Left hilar adenopathy Previously sampled Small Sampling showed lymphoid tissue that was benign Will reexamine with  EBUS  Former smoker No evidence of relapse    Consent Benefits, limitations and potential complications of the procedure were discussed with the patient/family  including, but not limited to bleeding, hemoptysis, respiratory failure requiring intubation and/or prolongued mechanical ventilation, infection, pneumothorax (collapse of lung) requiring chest tube placement, stroke  or even death.  Patient understands and agrees to proceed.    Renold Don, MD [place] PCCM 02/01/2021, 11:39 AM   *This note was dictated using voice recognition software/Dragon.  Despite best efforts to proofread, errors can occur which can change the meaning.  Any change was purely unintentional.

## 2021-02-01 NOTE — Telephone Encounter (Signed)
Per Dr. [name]- schedule 3-4wk rov with NP or self to discuss bronch results.   Will call patient once discharged.

## 2021-02-01 NOTE — Anesthesia Procedure Notes (Signed)
Procedure Name: Intubation Date/Time: 02/01/2021 12:53 PM Performed by: Nelda Marseille, CRNA Pre-anesthesia Checklist: Patient identified, Patient being monitored, Timeout performed, Emergency Drugs available and Suction available Patient Re-evaluated:Patient Re-evaluated prior to induction Oxygen Delivery Method: Circle system utilized Preoxygenation: Pre-oxygenation with 100% oxygen Induction Type: IV induction Ventilation: Mask ventilation without difficulty Laryngoscope Size: Mac, 3 and McGraph Grade View: Grade I Tube type: Oral Tube size: 8.5 mm Number of attempts: 1 Airway Equipment and Method: Stylet Placement Confirmation: ETT inserted through vocal cords under direct vision,  positive ETCO2 and breath sounds checked- equal and bilateral Secured at: 20 cm Tube secured with: Tape Dental Injury: Teeth and Oropharynx as per pre-operative assessment  Difficulty Due To: Difficulty was anticipated, Difficult Airway- due to limited oral opening and Difficult Airway- due to anterior larynx

## 2021-02-01 NOTE — Anesthesia Postprocedure Evaluation (Signed)
Anesthesia Post Note  Patient: Marie Hensley  Procedure(s) Performed: ROBOTIC ASSISTED VIDEO BRONCHOSCOPY WITH ENDOBRONCHIAL NAVIGATION (Left ) ROBOTIC ASSITSTED VIDEO BRONCHOSCOPY WITH ENDOBRONCHIAL ULTRASOUND (Left )  Patient location during evaluation: PACU Anesthesia Type: General Level of consciousness: awake and alert Pain management: pain level controlled Vital Signs Assessment: post-procedure vital signs reviewed and stable Respiratory status: spontaneous breathing, nonlabored ventilation, respiratory function stable and patient connected to nasal cannula oxygen Cardiovascular status: blood pressure returned to baseline and stable Postop Assessment: no apparent nausea or vomiting Anesthetic complications: no   No complications documented.   Last Vitals:  Vitals:   02/01/21 1545 02/01/21 1554  BP: 106/63 (!) 114/53  Pulse: 65 63  Resp: 17 (!) 21  Temp:  36.5 C  SpO2: 95% 97%    Last Pain:  Vitals:   02/01/21 1554  TempSrc:   PainSc: 0-No pain                 Arita Miss

## 2021-02-01 NOTE — Anesthesia Preprocedure Evaluation (Addendum)
Anesthesia Evaluation  Patient identified by MRN, date of birth, ID band Patient awake    Reviewed: Allergy & Precautions, NPO status , Patient's Chart, lab work & pertinent test results  History of Anesthesia Complications Negative for: history of anesthetic complications  Airway Mallampati: II  TM Distance: <3 FB Neck ROM: limited    Dental  (+) Chipped   Pulmonary neg sleep apnea, neg COPD, former smoker,    Pulmonary exam normal        Cardiovascular Exercise Tolerance: Good hypertension, Pt. on medications (-) CAD, (-) Past MI, (-) Cardiac Stents and (-) CABG Normal cardiovascular exam - Systolic murmurs    Neuro/Psych neg Seizures PSYCHIATRIC DISORDERS Depression negative neurological ROS     GI/Hepatic negative GI ROS, Neg liver ROS,   Endo/Other  negative endocrine ROSneg diabetes  Renal/GU CRFRenal disease     Musculoskeletal  (+) Arthritis ,   Abdominal (+) - obese,   Peds  Hematology negative hematology ROS (+)   Anesthesia Other Findings Past Medical History: No date: Arthritis No date: Dyspnea     Comment:  WITH EXERTION-SEEING DR [name] No date: HTN (hypertension) No date: Hyperlipemia   Reproductive/Obstetrics                             Anesthesia Physical  Anesthesia Plan  ASA: III  Anesthesia Plan: General ETT   Post-op Pain Management:    Induction: Intravenous  PONV Risk Score and Plan: 2 and Ondansetron, Dexamethasone and Treatment may vary due to age or medical condition  Airway Management Planned: Oral ETT  Additional Equipment:   Intra-op Plan:   Post-operative Plan: Extubation in OR  Informed Consent: I have reviewed the patients History and Physical, chart, labs and discussed the procedure including the risks, benefits and alternatives for the proposed anesthesia with the patient or authorized representative who has indicated his/her  understanding and acceptance.     Dental advisory given  Plan Discussed with: CRNA and Anesthesiologist  Anesthesia Plan Comments: (Patient consented for risks of anesthesia including but not limited to:  - adverse reactions to medications - damage to eyes, teeth, lips or other oral mucosa - nerve damage due to positioning  - sore throat or hoarseness - Damage to heart, brain, nerves, lungs, other parts of body or loss of life  Patient voiced understanding.)        Anesthesia Quick Evaluation

## 2021-02-02 ENCOUNTER — Encounter: Payer: Self-pay | Admitting: Pulmonary Disease

## 2021-02-02 LAB — CYTOLOGY - NON PAP

## 2021-02-02 LAB — SURGICAL PATHOLOGY

## 2021-02-02 NOTE — Telephone Encounter (Signed)
Lm for patient.   appt scheduled for 02/23/2021 at 4:30.

## 2021-02-03 NOTE — Telephone Encounter (Signed)
Patient is aware of appt date/time and voiced her understanding.  She is requesting bronch results.  Patient is aware that Dr. [name] is currently out of the office. She is okay with waiting on response.   Dr. [name], please advise. Thanks

## 2021-02-04 NOTE — Telephone Encounter (Signed)
Pt is returning a call. Please advise Her # is [phone]

## 2021-02-04 NOTE — Telephone Encounter (Signed)
Tyler Pita, MD  Claudette Head A, CMA Attempted to reach patient x3. Voicemail mailbox is not identified and therefore message was left. Her lung Koska P did not show malignancy. Lymph node continues to be benign despite activity noted on PET. Would recommend obtaining QuantiFERON gold and ACE level, rheumatoid factor, ESR, CRP and obtain also Histoplasma urinary antigen. We will discuss further on follow-up visit.   Patient is aware of results and voiced her understanding.  Labs have been ordered. Patient will come by [facility] lab department prior to [place].  Nothing further needed at this time.

## 2021-02-10 ENCOUNTER — Other Ambulatory Visit
Admission: RE | Admit: 2021-02-10 | Discharge: 2021-02-10 | Disposition: A | Discharge status: Home / Self Care | Payer: Medicare HMO | Source: Ambulatory Visit | Attending: Pulmonary Disease | Admitting: Pulmonary Disease

## 2021-02-10 DIAGNOSIS — R911 Solitary pulmonary nodule: Secondary | ICD-10-CM

## 2021-02-10 LAB — C-REACTIVE PROTEIN: CRP: 0.9 mg/dL (ref <1.0)

## 2021-02-10 LAB — SEDIMENTATION RATE: Sed Rate: 26 mm/hr (ref 0–30)

## 2021-02-11 LAB — RHEUMATOID FACTOR: Rheumatoid fact SerPl-aCnc: <10 IU/mL (ref <14.0)

## 2021-02-11 LAB — ANGIOTENSIN CONVERTING ENZYME: Angiotensin-Converting Enzyme: 5 U/L — ABNORMAL LOW (ref 14–82)

## 2021-02-12 ENCOUNTER — Other Ambulatory Visit: Payer: Self-pay

## 2021-02-12 DIAGNOSIS — F32A Depression, unspecified: Secondary | ICD-10-CM

## 2021-02-12 LAB — QUANTIFERON-TB GOLD PLUS: QuantiFERON-TB Gold Plus: NEGATIVE

## 2021-02-12 LAB — QUANTIFERON-TB GOLD PLUS (RQFGPL)
QuantiFERON Mitogen Value: >10 IU/mL
QuantiFERON Nil Value: 0.14 IU/mL
QuantiFERON TB1 Ag Value: 0.12 IU/mL
QuantiFERON TB2 Ag Value: 0.12 IU/mL

## 2021-02-12 MED ORDER — CITALOPRAM HYDROBROMIDE 20 MG PO TABS: 20.0000 mg | ORAL_TABLET | Freq: Every day | ORAL | 1 refills | Status: DC

## 2021-02-19 ENCOUNTER — Telehealth: Payer: Self-pay | Admitting: Pulmonary Disease

## 2021-02-19 NOTE — Telephone Encounter (Signed)
Spoke to patient and relayed below message.  Patient would like to repeat CT in 2-1mo.  Dr. [name] if okay to order, please advise.

## 2021-02-19 NOTE — Telephone Encounter (Signed)
Lm for patient.  

## 2021-02-19 NOTE — Telephone Encounter (Signed)
Okay to order but I still feel that biopsy by IR is the best way to go.

## 2021-02-19 NOTE — Telephone Encounter (Signed)
Spoke to patient regarding bx. Patient stated that she would like to hold off on bx for now and repeat CT in a few months.   Dr. [name], please advise. Thanks

## 2021-02-22 ENCOUNTER — Other Ambulatory Visit: Payer: Self-pay

## 2021-02-22 NOTE — Telephone Encounter (Signed)
Lm for patient.  

## 2021-02-23 ENCOUNTER — Ambulatory Visit: Payer: Medicare HMO | Admitting: Adult Health

## 2021-02-23 ENCOUNTER — Other Ambulatory Visit: Payer: Self-pay

## 2021-02-23 ENCOUNTER — Encounter: Payer: Self-pay | Admitting: Adult Health

## 2021-02-23 VITALS — BP 136/80 | HR 77 | Temp 97.1°F | Ht 64.0 in | Wt 167.4 lb

## 2021-02-23 DIAGNOSIS — R911 Solitary pulmonary nodule: Secondary | ICD-10-CM | POA: Diagnosis not present

## 2021-02-23 DIAGNOSIS — C3412 Malignant neoplasm of upper lobe, left bronchus or lung: Secondary | ICD-10-CM | POA: Insufficient documentation

## 2021-02-23 NOTE — Progress Notes (Signed)
@Patient  ID: Marie Hensley, female    DOB: 07/05/1946, 75 y.o.   MRN: [id number]  Chief Complaint  Patient presents with   Follow-up    Referring provider: [name], MD  HPI: 75 year old female former smoker quit 1991 seen for pulmonary consult December 14, 2020 for suspicious left upper lobe lung nodule measuring 2.9 x 1.8 cm.  Her Daughter is Joetta Manners -previous RN at our office.   TEST/EVENTS :   CT chest December 10, 2020 Showed a spiculated 2.9 x 1.8 cm left upper lobe nodule.  Associated tethering of the pleura.  Calcified pulmonary micronodule of the right lower lobe.  No adenopathy  PET scan December 24, 2020 showed hypermetabolic left upper lobe nodule maximum SUV 8.2 compatible with underlying malignancy. 3 small hypermetabolic left hilar lymph nodes, max SUV 6.9.  Also faintly hypermetabolic peribronchial vascular node between the dominant nodule and left hilum.  Focal hypermetabolic activity in the splenic flexure of the colon.  02/23/2021 Follow up : Lung nodule  Patient returns for a follow-up visit.  She was seen December 14, 2020 for suspicious left upper lobe lung nodule measuring 2.9 x 1.8 cm.  PET scan showed hypermetabolic activity.  She was set up for navigational bronchoscopy and EBUS April 13 that was nondiagnostic with no malignant cells noted. CT chest was repeated on Jan 14, 2021 that showed unchanged spiculated nodule left upper lobe.  And new mild left peritracheal and posterior left hilar lymphadenopathy. Patient was set up for a navigational bronchoscopy and EBUS completed on Feb 01, 2021 Pathology and cytology was negative for malignant cells.  Showed benign bronchial epithelial cells.  Lab work including QuantiFERON gold, ACE level, rheumatoid factor, sed rate, CRP and histoplasma urine antigen were all negative He has been recommended to undergo an interventional radiology CT-guided biopsy of her peripheral left upper lobe nodule Since procedure patient  is doing well . Overall breathing is doing well . No cough or wheezing . No previous dx of asthma or copd.  She is very active, does housework, yard work. Garden,  We went over her test results in detail.  Patient has been recommended for an interventional radiology CT-guided biopsy of left upper lobe nodule .  We went over in detail that this nodule is suspicious for underlying malignancy and possible lymph node involvement.  Patient is very frustrated and does not want to proceed with another biopsy at this time.  She wants to discuss this with her family.  She wants to wait and follow with serial CT.  I did discuss that if this is a cancer that could mean cancer growth and she verbalizes understanding. She is had no hemoptysis or unintentional weight loss.   No Known Allergies  Immunization History  Administered Date(s) Administered   Fluad Quad(high Dose 65+) 05/29/2020   Influenza,inj,Quad PF,6+ Mos 07/14/2019   PFIZER Comirnaty(Gray Top)Covid-19 Tri-Sucrose Vaccine 09/30/2020   PFIZER(Purple Top)SARS-COV-2 Vaccination 12/27/2019, 01/17/2020   Pneumococcal Conjugate-13 11/12/2015   Pneumococcal Polysaccharide-23 07/20/2017    Past Medical History:  Diagnosis Date   Arthritis    Depression    Dyspnea    WITH EXERTION-SEEING DR [name]   HTN (hypertension)    Hyperlipemia     Tobacco History: Social History   Tobacco Use  Smoking Status Former   Packs/day: 0.50   Years: 20.00   Pack years: 10.00   Types: Cigarettes   Quit date: 10/27/1989   Years since quitting: 31.3  Smokeless Tobacco Never  Tobacco Comments   Smoked off and on in 20 year period   Counseling given: Not Answered Tobacco comments: Smoked off and on in 20 year period   Outpatient Medications Prior to Visit  Medication Sig Dispense Refill   acetaminophen (TYLENOL) 500 MG tablet Take 1,000 mg by mouth every 6 (six) hours as needed for mild pain (Arthritis).     atorvastatin (LIPITOR) 40 MG tablet  Take 1 tablet (40 mg total) by mouth daily. (Patient taking differently: Take 40 mg by mouth at bedtime.) 90 tablet 3   Cetirizine HCl (ZYRTEC PO) Take 10 mg by mouth daily as needed (Allergy).     citalopram (CELEXA) 20 MG tablet Take 1 tablet (20 mg total) by mouth at bedtime. 90 tablet 1   fluticasone (FLONASE) 50 MCG/ACT nasal spray Place 1 spray into both nostrils daily.     lisinopril-hydrochlorothiazide (ZESTORETIC) 10-12.5 MG tablet Take 1 tablet by mouth daily. (Patient taking differently: Take 1 tablet by mouth at bedtime.) 90 tablet 3   Loperamide HCl (IMODIUM PO) Take 2 mg by mouth daily as needed (Diarrhea).     Multiple Vitamin (MULTIVITAMIN WITH MINERALS) TABS tablet Take 1 tablet by mouth daily.     propranolol ER (INDERAL LA) 60 MG 24 hr capsule TAKE ONE CAPSULE BY MOUTH ONE TIME DAILY AT BEDTIME FOR TREMORS 30 capsule 1   No facility-administered medications prior to visit.     Review of Systems:   Constitutional:   No  weight loss, night sweats,  Fevers, chills, fatigue, or  lassitude.  HEENT:   No headaches,  Difficulty swallowing,  Tooth/dental problems, or  Sore throat,                No sneezing, itching, ear ache, nasal congestion, post nasal drip,   CV:  No chest pain,  Orthopnea, PND, swelling in lower extremities, anasarca, dizziness, palpitations, syncope.   GI  No heartburn, indigestion, abdominal pain, nausea, vomiting, diarrhea, change in bowel habits, loss of appetite, bloody stools.   Resp: No shortness of breath with exertion or at rest.  No excess mucus, no productive cough,  No non-productive cough,  No coughing up of blood.  No change in color of mucus.  No wheezing.  No chest wall deformity  Skin: no rash or lesions.  GU: no dysuria, change in color of urine, no urgency or frequency.  No flank pain, no hematuria   MS:  No joint pain or swelling.  No decreased range of motion.  No back pain.    Physical Exam  BP 136/80 (BP Location: Left Arm,  Patient Position: Sitting, Cuff Size: Normal)   Pulse 77   Temp (!) 97.1 F (36.2 C) (Temporal)   Ht 5\' 4"  (1.626 m)   Wt 167 lb 6.4 oz (75.9 kg)   SpO2 96%   BMI 28.73 kg/m   GEN: A/Ox3; pleasant , NAD, well nourished    HEENT:  [place]/AT,   NOSE-clear, THROAT-clear, no lesions, no postnasal drip or exudate noted.   NECK:  Supple w/ fair ROM; no JVD; normal carotid impulses w/o bruits; no thyromegaly or nodules palpated; no lymphadenopathy.    RESP  Clear  P & A; w/o, wheezes/ rales/ or rhonchi. no accessory muscle use, no dullness to percussion  CARD:  RRR, no m/r/g, no peripheral edema, pulses intact, no cyanosis or clubbing.  GI:   Soft & nt; nml bowel sounds; no organomegaly or masses detected.   Musco: Warm bil, no  deformities or joint swelling noted.   Neuro: alert, no focal deficits noted.    Skin: Warm, no lesions or rashes    Lab Results:  CBC    Component Value Date/Time   WBC 7.2 11/19/2020 1358   RBC 3.90 11/19/2020 1358   HGB 13.6 02/01/2021 1124   HCT 40.0 02/01/2021 1124   PLT 203.0 11/19/2020 1358   MCV 96.2 11/19/2020 1358   MCHC 34.0 11/19/2020 1358   RDW 13.2 11/19/2020 1358   LYMPHSABS 2.6 04/20/2020 0832   MONOABS 0.5 04/20/2020 0832   EOSABS 0.2 04/20/2020 0832   BASOSABS 0.0 04/20/2020 0832    BMET    Component Value Date/Time   NA 137 02/01/2021 1124   K 4.1 02/01/2021 1124   CL 105 02/01/2021 1124   CO2 28 11/19/2020 1358   GLUCOSE 129 (H) 02/01/2021 1124   BUN 46 (H) 02/01/2021 1124   CREATININE 1.60 (H) 02/01/2021 1124   CALCIUM 9.8 11/19/2020 1358    BNP No results found for: BNP  ProBNP    Component Value Date/Time   PROBNP 17.0 11/19/2020 1358    Imaging: DG Chest Port 1 View  Result Date: 02/01/2021 CLINICAL DATA:  Status post biopsy. EXAM: PORTABLE CHEST 1 VIEW COMPARISON:  Chest CT 01/13/2021 FINDINGS: No evidence of pneumothorax post biopsy. Streaky left suprahilar opacity with vague nodular density corresponding  to nodule on prior CT. No acute airspace disease or pleural effusion. Normal heart size. Adenopathy on recent CT not appreciated by radiograph. IMPRESSION: 1. No evidence of pneumothorax post biopsy. 2. Streaky left suprahilar opacity with vague nodular density corresponding to nodule on prior chest CT. Electronically Signed   By: Keith Rake M.D.   On: 02/01/2021 16:56   DG C-Arm 1-60 Min-No Report  Result Date: 02/01/2021 Fluoroscopy was utilized by the requesting physician.  No radiographic interpretation.    albuterol (PROVENTIL) (2.5 MG/3ML) 0.083% nebulizer solution 2.5 mg     Date Action Dose Route User   Discharged on 02/01/2021   Admitted on 02/01/2021   12/28/2020 1523 Given 2.5 mg Nebulization Dwaine Deter H, RT       No flowsheet data found.  No results found for: NITRICOXIDE      Assessment & Plan:   Pulmonary nodule Suspicious left upper lobe pulmonary nodule hypermetabolic on PET scan with hypermetabolic lymph nodes.  Patient is underwent navigational bronchoscopy and EBUS x2 which has been nondiagnostic with negative path and cytology for malignant cells. Due to the suspicious nature on CT and PET scan along with possible lymph node involvement and size of nodule we recommend that patient undergo a CT-guided biopsy by interventional radiology Patient education was given.  Patient verbalizes understanding but at this time wants to do serial follow-up.  Will set up a CT chest without contrast in 6 weeks.  Advised if patient changes her mind to call our office back and we will set up CT-guided biopsy    I spent   30 minutes dedicated to the care of this patient on the date of this encounter to include pre-visit review of records, face-to-face time with the patient discussing conditions above, post visit ordering of testing, clinical documentation with the electronic health record, making appropriate referrals as documented, and communicating necessary findings to  members of the patients care team.   Rexene Edison, NP 02/23/2021

## 2021-02-23 NOTE — Patient Instructions (Addendum)
Set up CT chest without contrast in 6 weeks .  Call back if you change your mind on biopsy , please call back.  Follow up with Dr. [name] in 6 weeks and As needed

## 2021-02-23 NOTE — Assessment & Plan Note (Signed)
Suspicious left upper lobe pulmonary nodule hypermetabolic on PET scan with hypermetabolic lymph nodes.  Patient is underwent navigational bronchoscopy and EBUS x2 which has been nondiagnostic with negative path and cytology for malignant cells. Due to the suspicious nature on CT and PET scan along with possible lymph node involvement and size of nodule we recommend that patient undergo a CT-guided biopsy by interventional radiology Patient education was given.  Patient verbalizes understanding but at this time wants to do serial follow-up.  Will set up a CT chest without contrast in 6 weeks.  Advised if patient changes her mind to call our office back and we will set up CT-guided biopsy

## 2021-02-23 NOTE — Telephone Encounter (Signed)
Lm for pt.  Patient I scheduled for ov today. Will discuss during visit.

## 2021-02-24 NOTE — Telephone Encounter (Signed)
This was discussed during [place]. CT ordered. Nothing further needed.

## 2021-02-25 ENCOUNTER — Ambulatory Visit: Payer: Medicare HMO | Admitting: Family Medicine

## 2021-02-25 NOTE — Progress Notes (Signed)
I have discussed the same with the patient as well as with the patient's daughter.  I had actually discussed with the patient's daughter after the second procedure that she may need IR intervention as the tissue available to navigation does not show malignancy.  Lymph nodes have been adequately sampled with lymphoid tissue but negative.  Recommend intervention by IR who has agreed.  Patient however opts to go on observational protocol which I recommend against.  Agree with the details of the visit as noted by Rexene Edison, NP.  Loletha Grayer Derrill Kay, MD [place]

## 2021-03-03 ENCOUNTER — Encounter: Payer: Self-pay | Admitting: Family Medicine

## 2021-03-03 ENCOUNTER — Other Ambulatory Visit: Payer: Self-pay

## 2021-03-03 ENCOUNTER — Ambulatory Visit (INDEPENDENT_AMBULATORY_CARE_PROVIDER_SITE_OTHER): Payer: Medicare HMO | Admitting: Family Medicine

## 2021-03-03 VITALS — BP 108/70 | HR 61 | Temp 98.0°F | Ht 64.0 in | Wt 167.5 lb

## 2021-03-03 DIAGNOSIS — R911 Solitary pulmonary nodule: Secondary | ICD-10-CM | POA: Diagnosis not present

## 2021-03-03 DIAGNOSIS — G250 Essential tremor: Secondary | ICD-10-CM | POA: Diagnosis not present

## 2021-03-03 DIAGNOSIS — I10 Essential (primary) hypertension: Secondary | ICD-10-CM | POA: Diagnosis not present

## 2021-03-03 DIAGNOSIS — I700 Atherosclerosis of aorta: Secondary | ICD-10-CM | POA: Diagnosis not present

## 2021-03-03 DIAGNOSIS — E785 Hyperlipidemia, unspecified: Secondary | ICD-10-CM | POA: Diagnosis not present

## 2021-03-03 MED ORDER — ATORVASTATIN CALCIUM 40 MG PO TABS: 40.0000 mg | ORAL_TABLET | Freq: Every day | ORAL | 3 refills | Status: DC

## 2021-03-03 MED ORDER — PROPRANOLOL HCL ER 60 MG PO CP24: 60.0000 mg | ORAL_CAPSULE | Freq: Every day | ORAL | 3 refills | Status: DC

## 2021-03-03 NOTE — Assessment & Plan Note (Signed)
BP at goal. Cont lisinopril-hctz 10-12.5.

## 2021-03-03 NOTE — Assessment & Plan Note (Signed)
Controlled on propranolol 60 mg ER nightly. Will monitor bp as slightly low today

## 2021-03-03 NOTE — Progress Notes (Signed)
Subjective:     Marie Hensley is a 75 y.o. female presenting for discuss lab results     HPI  #Suspicious lung lesion  - has had bronchoscopics tests done to try and find the pathology - March 10th was the XR - has been told she needs to do IR for biopsy - family is visiting July 16th - and planning to schedule the CT biopsy after this visit - feeling well no symptoms  - did a breathing test which was good  #HTN - bp low  - notes getting some higher numbers at home - tolerating medication  #tremor - well treated with propranolol   Review of Systems   Social History   Tobacco Use  Smoking Status Former   Packs/day: 0.50   Years: 20.00   Pack years: 10.00   Types: Cigarettes   Quit date: 10/27/1989   Years since quitting: 31.3  Smokeless Tobacco Never  Tobacco Comments   Smoked off and on in 20 year period        Objective:    BP Readings from Last 3 Encounters:  03/03/21 108/70  02/23/21 136/80  02/01/21 104/69   Wt Readings from Last 3 Encounters:  03/03/21 167 lb 8 oz (76 kg)  02/23/21 167 lb 6.4 oz (75.9 kg)  02/01/21 168 lb (76.2 kg)    BP 108/70 (BP Location: Left Arm, Patient Position: Sitting, Cuff Size: Normal)   Pulse 61   Temp 98 F (36.7 C) (Temporal)   Ht 5\' 4"  (1.626 m)   Wt 167 lb 8 oz (76 kg)   SpO2 95%   BMI 28.75 kg/m    Physical Exam Constitutional:      General: She is not in acute distress.    Appearance: She is well-developed. She is not diaphoretic.  HENT:     Right Ear: External ear normal.     Left Ear: External ear normal.  Eyes:     Conjunctiva/sclera: Conjunctivae normal.  Cardiovascular:     Rate and Rhythm: Normal rate.  Pulmonary:     Effort: Pulmonary effort is normal.  Musculoskeletal:     Cervical back: Neck supple.  Skin:    General: Skin is warm and dry.     Capillary Refill: Capillary refill takes less than 2 seconds.  Neurological:     Mental Status: She is alert. Mental status is at  baseline.  Psychiatric:        Mood and Affect: Mood normal.        Behavior: Behavior normal.          Assessment & Plan:   Problem List Items Addressed This Visit       Cardiovascular and Mediastinum   Essential hypertension    BP at goal. Cont lisinopril-hctz 10-12.5.        Relevant Medications   propranolol ER (INDERAL LA) 60 MG 24 hr capsule   atorvastatin (LIPITOR) 40 MG tablet   Atherosclerosis of aorta (HCC)    Noted on imaging. Already on atorvastatin. Will discuss asa at next visit.        Relevant Medications   propranolol ER (INDERAL LA) 60 MG 24 hr capsule   atorvastatin (LIPITOR) 40 MG tablet     Nervous and Auditory   Essential tremor    Controlled on propranolol 60 mg ER nightly. Will monitor bp as slightly low today       Relevant Medications   propranolol ER (INDERAL LA) 60 MG 24 hr  capsule     Other   Hyperlipidemia    Stable. Cont atorvastatin       Relevant Medications   propranolol ER (INDERAL LA) 60 MG 24 hr capsule   atorvastatin (LIPITOR) 40 MG tablet   Pulmonary nodule - Primary    Reviewed pulmonology work-up and imaging thus far. Discussed high concern on imaging for cancer and thus the reason for repeated attempts. Pt is planning to proceed with IR guided biopsy but would like to wait until after her children visit in July. She already has f/u CT imaging scheduled for end of July which is reassuring with the lack of pathology at this point. Appreciate pulm care. Anticipate based on timing she will get biopsy after CT in July - she knows to call Pulm if she would like the biopsy done sooner.          Return if symptoms worsen or fail to improve.  [name], MD  This visit occurred during the SARS-CoV-2 public health emergency.  Safety protocols were in place, including screening questions prior to the visit, additional usage of staff PPE, and extensive cleaning of exam room while observing appropriate contact time as  indicated for disinfecting solutions.

## 2021-03-03 NOTE — Assessment & Plan Note (Signed)
Noted on imaging. Already on atorvastatin. Will discuss asa at next visit.

## 2021-03-03 NOTE — Patient Instructions (Signed)
Call Dr. Duwayne Heck if you decide you want to the IR biopsy  I'm glad you already have a scheduled follow-up imaging test

## 2021-03-03 NOTE — Assessment & Plan Note (Signed)
Reviewed pulmonology work-up and imaging thus far. Discussed high concern on imaging for cancer and thus the reason for repeated attempts. Pt is planning to proceed with IR guided biopsy but would like to wait until after her children visit in July. She already has f/u CT imaging scheduled for end of July which is reassuring with the lack of pathology at this point. Appreciate pulm care. Anticipate based on timing she will get biopsy after CT in July - she knows to call Pulm if she would like the biopsy done sooner.

## 2021-03-03 NOTE — Assessment & Plan Note (Signed)
Stable. Cont atorvastatin

## 2021-03-04 ENCOUNTER — Emergency Department (HOSPITAL_COMMUNITY): Payer: Medicare HMO

## 2021-03-04 ENCOUNTER — Other Ambulatory Visit: Payer: Self-pay

## 2021-03-04 ENCOUNTER — Emergency Department (HOSPITAL_COMMUNITY)
Admission: EM | Admit: 2021-03-04 | Discharge: 2021-03-04 | Disposition: A | Discharge status: Home / Self Care | Payer: Medicare HMO | Attending: Emergency Medicine | Admitting: Emergency Medicine

## 2021-03-04 DIAGNOSIS — Z87891 Personal history of nicotine dependence: Secondary | ICD-10-CM | POA: Diagnosis not present

## 2021-03-04 DIAGNOSIS — C719 Malignant neoplasm of brain, unspecified: Secondary | ICD-10-CM | POA: Diagnosis not present

## 2021-03-04 DIAGNOSIS — Z743 Need for continuous supervision: Secondary | ICD-10-CM | POA: Diagnosis not present

## 2021-03-04 DIAGNOSIS — N184 Chronic kidney disease, stage 4 (severe): Secondary | ICD-10-CM | POA: Insufficient documentation

## 2021-03-04 DIAGNOSIS — R29810 Facial weakness: Secondary | ICD-10-CM | POA: Diagnosis not present

## 2021-03-04 DIAGNOSIS — C7931 Secondary malignant neoplasm of brain: Secondary | ICD-10-CM

## 2021-03-04 DIAGNOSIS — R531 Weakness: Secondary | ICD-10-CM | POA: Diagnosis not present

## 2021-03-04 DIAGNOSIS — D496 Neoplasm of unspecified behavior of brain: Secondary | ICD-10-CM

## 2021-03-04 DIAGNOSIS — R4781 Slurred speech: Secondary | ICD-10-CM | POA: Diagnosis not present

## 2021-03-04 DIAGNOSIS — Z79899 Other long term (current) drug therapy: Secondary | ICD-10-CM | POA: Insufficient documentation

## 2021-03-04 DIAGNOSIS — R4702 Dysphasia: Secondary | ICD-10-CM | POA: Diagnosis not present

## 2021-03-04 DIAGNOSIS — G319 Degenerative disease of nervous system, unspecified: Secondary | ICD-10-CM | POA: Diagnosis not present

## 2021-03-04 DIAGNOSIS — I129 Hypertensive chronic kidney disease with stage 1 through stage 4 chronic kidney disease, or unspecified chronic kidney disease: Secondary | ICD-10-CM | POA: Insufficient documentation

## 2021-03-04 DIAGNOSIS — G9389 Other specified disorders of brain: Secondary | ICD-10-CM | POA: Diagnosis not present

## 2021-03-04 DIAGNOSIS — I10 Essential (primary) hypertension: Secondary | ICD-10-CM | POA: Diagnosis not present

## 2021-03-04 DIAGNOSIS — J019 Acute sinusitis, unspecified: Secondary | ICD-10-CM | POA: Diagnosis not present

## 2021-03-04 DIAGNOSIS — G936 Cerebral edema: Secondary | ICD-10-CM | POA: Diagnosis not present

## 2021-03-04 DIAGNOSIS — R4701 Aphasia: Secondary | ICD-10-CM | POA: Diagnosis not present

## 2021-03-04 DIAGNOSIS — Z85118 Personal history of other malignant neoplasm of bronchus and lung: Secondary | ICD-10-CM | POA: Diagnosis not present

## 2021-03-04 DIAGNOSIS — Z8673 Personal history of transient ischemic attack (TIA), and cerebral infarction without residual deficits: Secondary | ICD-10-CM | POA: Diagnosis not present

## 2021-03-04 LAB — COMPREHENSIVE METABOLIC PANEL
ALT: 15 U/L (ref 0–44)
AST: 23 U/L (ref 15–41)
Albumin: 4.1 g/dL (ref 3.5–5.0)
Alkaline Phosphatase: 52 U/L (ref 38–126)
Anion gap: 10 (ref 5–15)
BUN: 34 mg/dL — ABNORMAL HIGH (ref 8–23)
CO2: 23 mmol/L (ref 22–32)
Calcium: 9.6 mg/dL (ref 8.9–10.3)
Chloride: 104 mmol/L (ref 98–111)
Creatinine, Ser: 1.44 mg/dL — ABNORMAL HIGH (ref 0.44–1.00)
GFR, Estimated: 38 mL/min — ABNORMAL LOW (ref >60)
Glucose, Bld: 154 mg/dL — ABNORMAL HIGH (ref 70–99)
Potassium: 4.2 mmol/L (ref 3.5–5.1)
Sodium: 137 mmol/L (ref 135–145)
Total Bilirubin: 1.1 mg/dL (ref 0.3–1.2)
Total Protein: 7.5 g/dL (ref 6.5–8.1)

## 2021-03-04 LAB — APTT: aPTT: 24 seconds (ref 24–36)

## 2021-03-04 LAB — I-STAT CHEM 8, ED
BUN: 33 mg/dL — ABNORMAL HIGH (ref 8–23)
Calcium, Ion: 1.15 mmol/L (ref 1.15–1.40)
Chloride: 107 mmol/L (ref 98–111)
Creatinine, Ser: 1.4 mg/dL — ABNORMAL HIGH (ref 0.44–1.00)
Glucose, Bld: 150 mg/dL — ABNORMAL HIGH (ref 70–99)
HCT: 41 % (ref 36.0–46.0)
Hemoglobin: 13.9 g/dL (ref 12.0–15.0)
Potassium: 4.1 mmol/L (ref 3.5–5.1)
Sodium: 138 mmol/L (ref 135–145)
TCO2: 20 mmol/L — ABNORMAL LOW (ref 22–32)

## 2021-03-04 LAB — CBC
HCT: 40.4 % (ref 36.0–46.0)
Hemoglobin: 13.1 g/dL (ref 12.0–15.0)
MCH: 32.1 pg (ref 26.0–34.0)
MCHC: 32.4 g/dL (ref 30.0–36.0)
MCV: 99 fL (ref 80.0–100.0)
Platelets: 188 10*3/uL (ref 150–400)
RBC: 4.08 MIL/uL (ref 3.87–5.11)
RDW: 12.9 % (ref 11.5–15.5)
WBC: 5.5 10*3/uL (ref 4.0–10.5)
nRBC: 0 % (ref 0.0–0.2)

## 2021-03-04 LAB — PROTIME-INR
INR: 1 (ref 0.8–1.2)
Prothrombin Time: 12.9 seconds (ref 11.4–15.2)

## 2021-03-04 LAB — DIFFERENTIAL
Abs Immature Granulocytes: 0.04 10*3/uL (ref 0.00–0.07)
Basophils Absolute: 0 10*3/uL (ref 0.0–0.1)
Basophils Relative: 1 %
Eosinophils Absolute: 0.2 10*3/uL (ref 0.0–0.5)
Eosinophils Relative: 3 %
Immature Granulocytes: 1 %
Lymphocytes Relative: 33 %
Lymphs Abs: 1.8 10*3/uL (ref 0.7–4.0)
Monocytes Absolute: 0.4 10*3/uL (ref 0.1–1.0)
Monocytes Relative: 8 %
Neutro Abs: 3 10*3/uL (ref 1.7–7.7)
Neutrophils Relative %: 54 %

## 2021-03-04 LAB — CBG MONITORING, ED: Glucose-Capillary: 146 mg/dL — ABNORMAL HIGH (ref 70–99)

## 2021-03-04 IMAGING — CT CT HEAD CODE STROKE
4 series · 16 of 47 positions shown, 18 images · non-contrast
Comparison: No pertinent prior exams available for comparison.

CLINICAL DATA: Code stroke. Stroke/TIA, assess intracranial
arteries. Additional history obtained from neurology physician:
Patient with history of lung cancer, aphasia.

EXAM:
CT HEAD WITHOUT CONTRAST
TECHNIQUE: Contiguous axial images were obtained from the base of the skull
through the vertex without intravenous contrast.

[Series 3: head 5.0 st · axial · 0.47mm/px · z∈[-116,+4]mm · 7 of 32 slices shown, 9 images]
[im 4/32  brain]
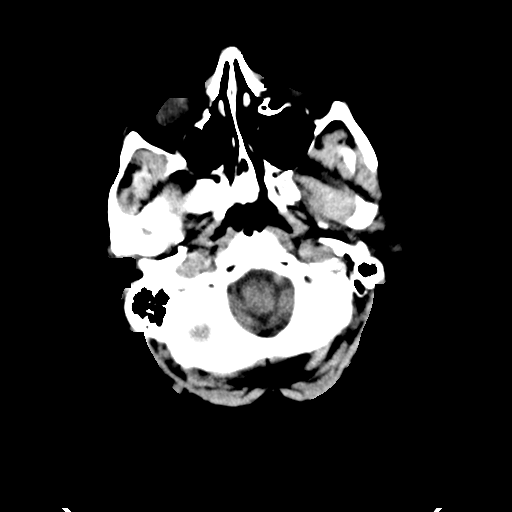
[im 4/32  bone]
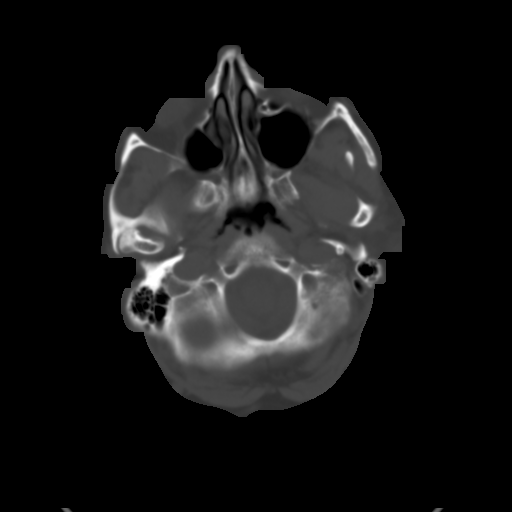
[im 8/32  brain]
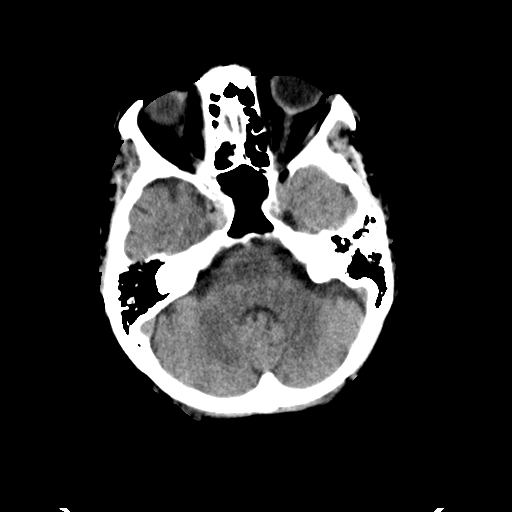
[im 12/32  brain]
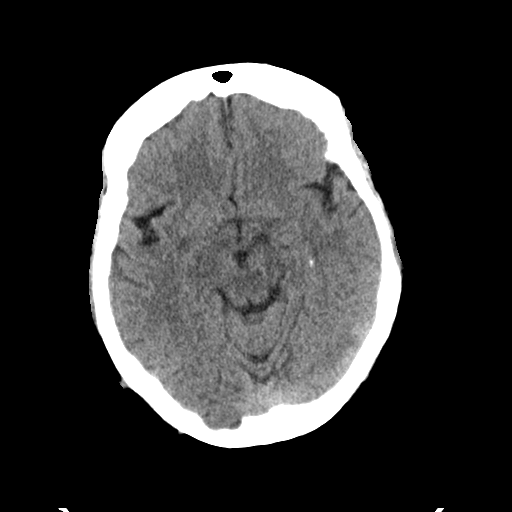
[im 16/32  brain]
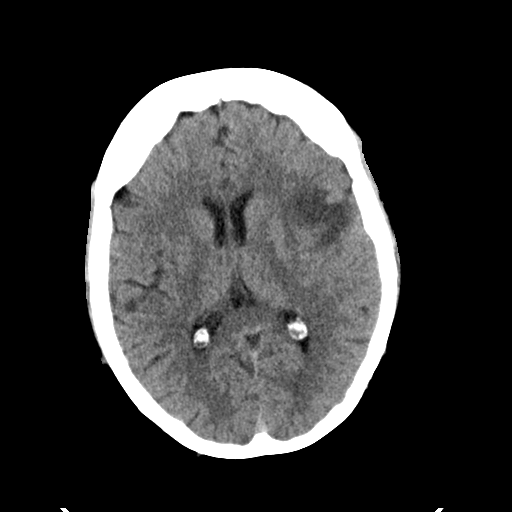
[im 20/32  brain]
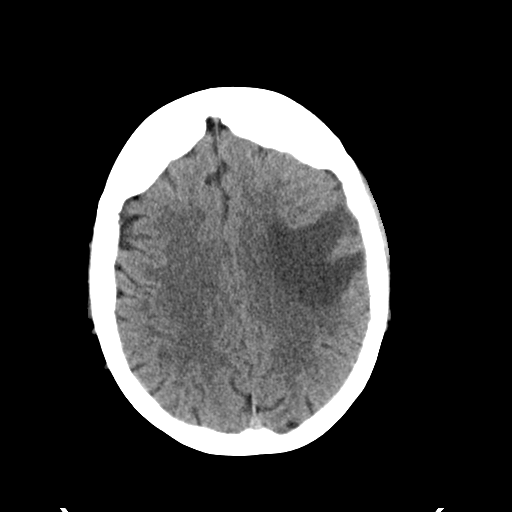
[im 20/32  bone]
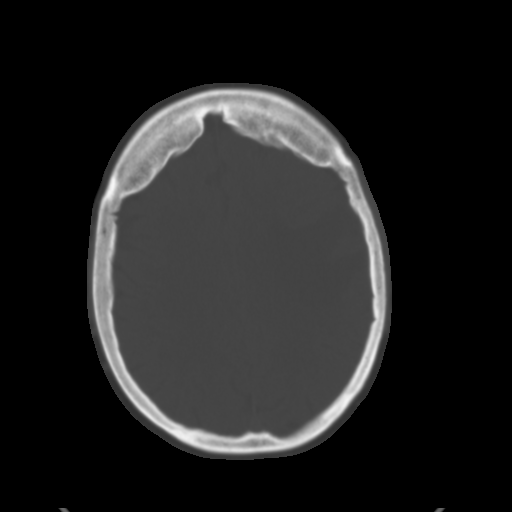
[im 24/32  brain]
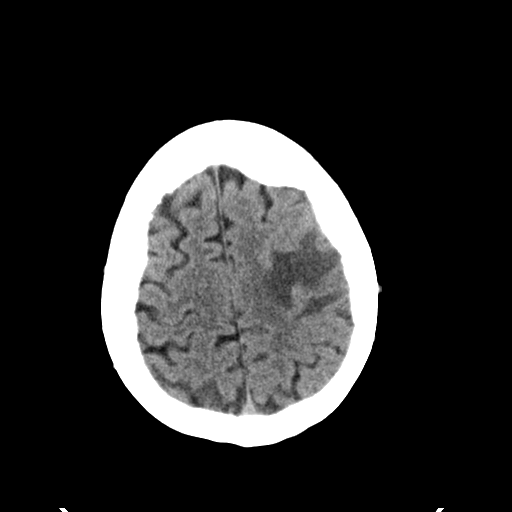
[im 28/32  brain]
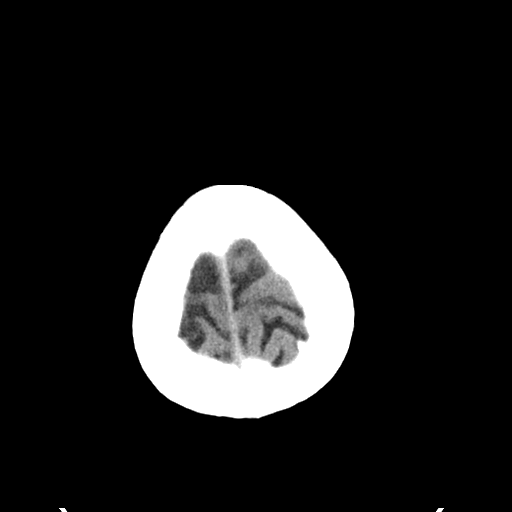

[Series 4: head 2.0 bone · axial · 0.47mm/px · z∈[-117,-85]mm · 3 of 79 slices shown]
[im 8/79  bone]
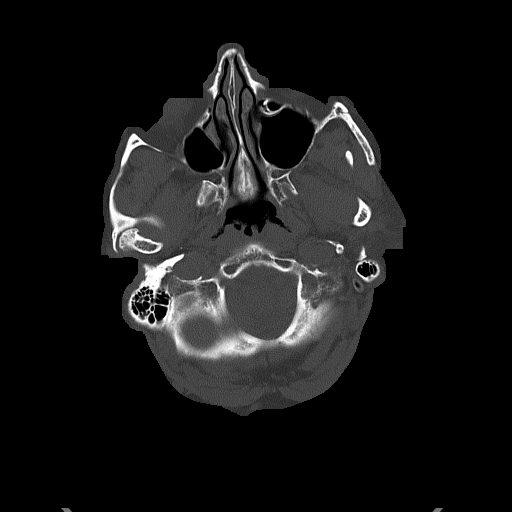
[im 16/79  bone]
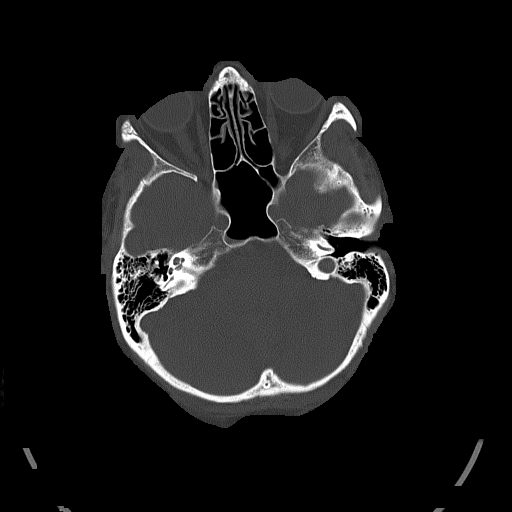
[im 24/79  bone]
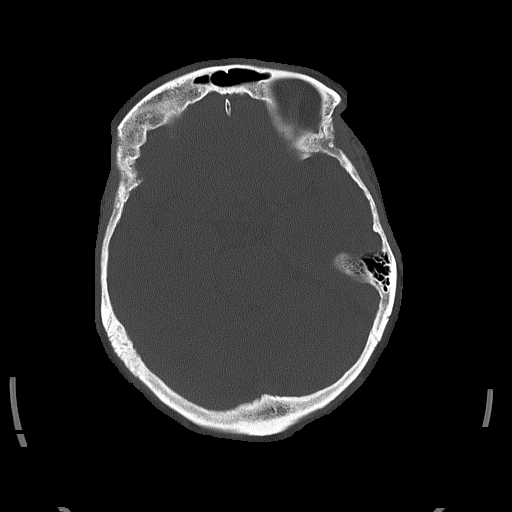

[Series 5: head 3.0 cor st · coronal · 0.34mm/px · 3 of 67 slices shown]
[im 23/67  brain]
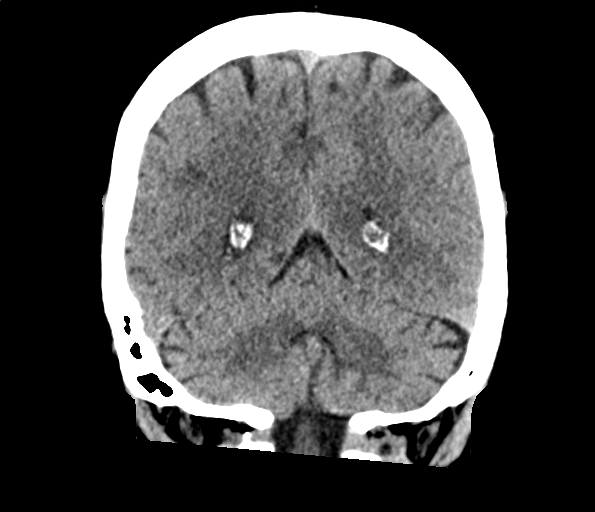
[im 30/67  brain]
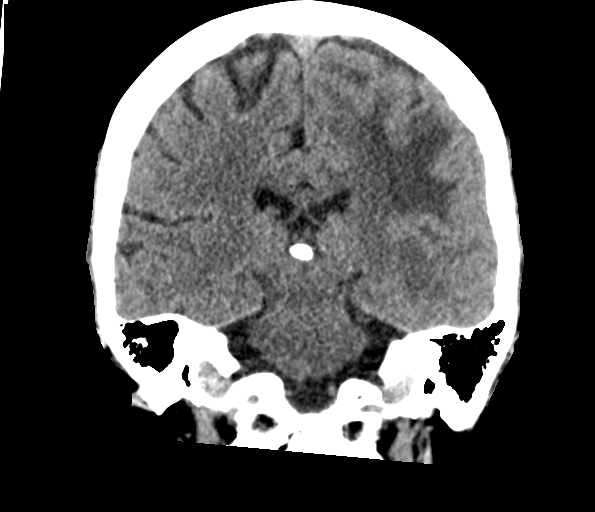
[im 37/67  brain]
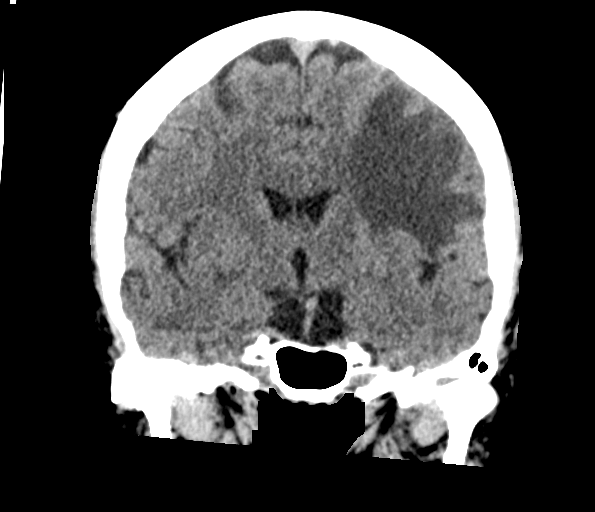

[Series 6: head 3.0 sag st · sagittal · 0.34mm/px · 3 of 67 slices shown]
[im 23/67  brain]
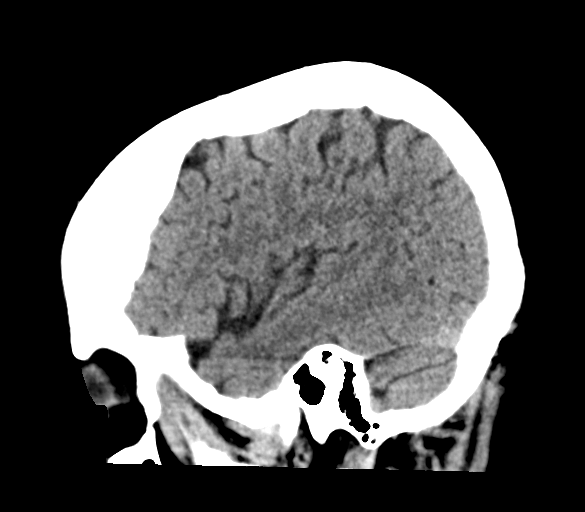
[im 34/67  brain]
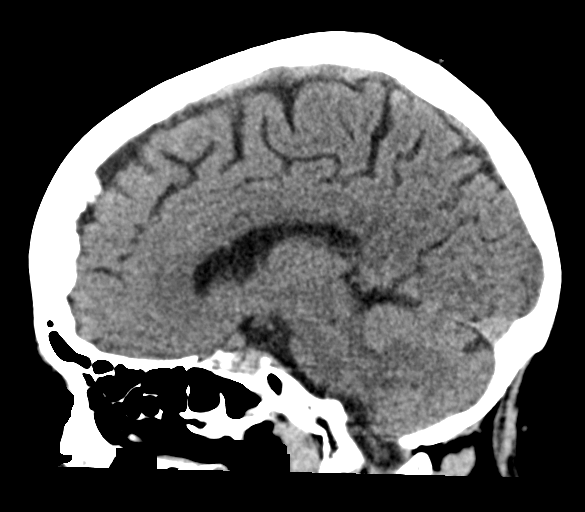
[im 45/67  brain]
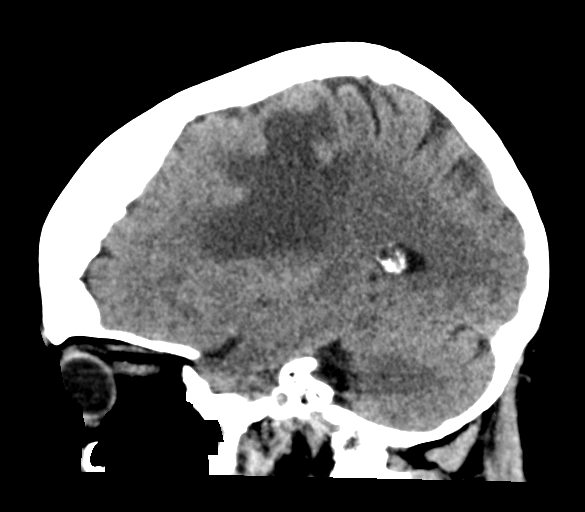

[16 of 47 positions shown; findings below may reference images not displayed]

FINDINGS: Brain:

Cerebral volume is normal for age.

There is a region of vasogenic edema within the mid to posterior
left frontal lobe, measuring 5.2 x 4.3 x 5.6 cm (AP x TV x CC). No
loss of gray-white differentiation is appreciated. Local mass effect
regional cerebral sulcal effacement. No significant ventricular
effacement or midline shift. Findings are highly suspicious for an
underlying mass/metastasis at this site given the known history of
lung cancer.

There is no acute intracranial hemorrhage.

No extra-axial fluid collection.

Vascular: No hyperdense vessel.

Skull: Normal. Negative for fracture or focal suspicious osseous
lesion.

Sinuses/Orbits: Visualized orbits show no acute finding. Small fluid
level within the right sphenoid sinus. Background mild right
sphenoid sinus mucosal thickening. Trace bilateral ethmoid sinus
mucosal thickening.

These results were called by telephone at the time of interpretation
on [DATE] at [DATE] to provider Dr. ANDA, who verbally
acknowledged these results.
IMPRESSION: 5.2 x 4.3 x 5.6 cm region of vasogenic edema within the mid to
posterior left frontal lobe. Findings are highly suspicious for an
underlying mass/metastasis at this site given the known history of
lung cancer. A contrast-enhanced brain MRI is recommended for
further evaluation.

Regional mass effect without significant ventricular effacement or
midline shift.

Paranasal sinus disease, as described.

## 2021-03-04 IMAGING — MR MR HEAD WO/W CM
8 of 14 series · 23 of 48 positions shown · IV contrast (Yes GAD)
Comparison: Noncontrast head CT performed earlier today [DATE].

CLINICAL DATA: Neuro deficit, acute, stroke suspected; brain mass
or lesion.

EXAM:
MRI HEAD WITHOUT AND WITH CONTRAST
TECHNIQUE: Multiplanar, multiecho pulse sequences of the brain and surrounding
structures were obtained without and with intravenous contrast.
CONTRAST:  7mL GADAVIST GADOBUTROL 1 MMOL/ML IV SOLN

[Series 2: DWI · axial · 3.0mm · 0.94mm/px · z∈[-98,+61]mm · 8 of 107 slices shown (1 of 2)]
[im 1/107]
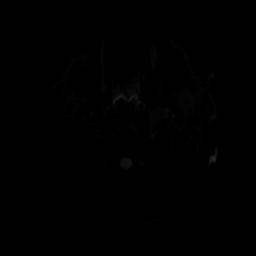
[im 16/107]
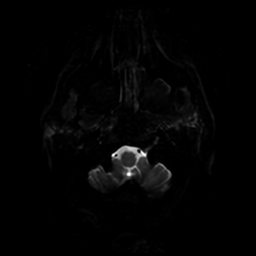
[im 31/107]
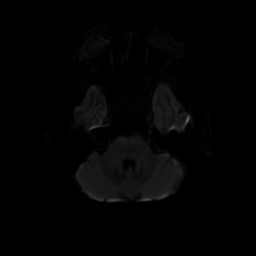
[im 46/107]
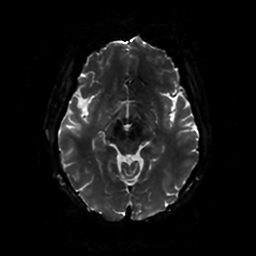
[im 61/107]
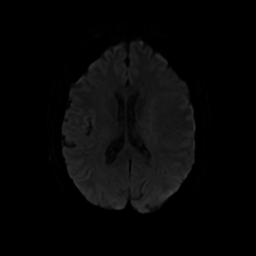
[im 76/107]
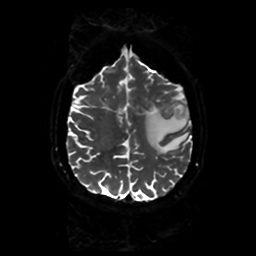
[im 91/107]
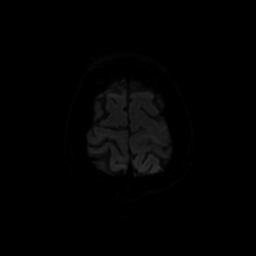
[im 107/107]
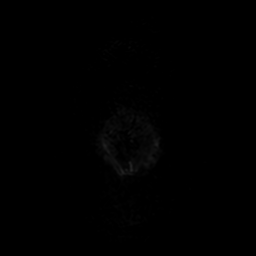

[Series 3: DWI · coronal · 4.0mm · 0.94mm/px · 5 of 73 slices shown (2 of 2)]
[im 1/73]
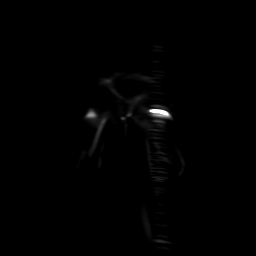
[im 19/73]
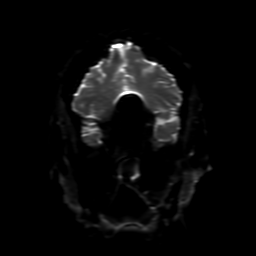
[im 37/73]
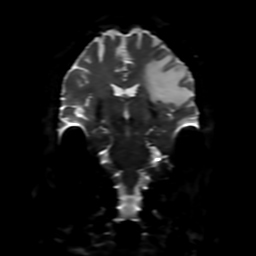
[im 55/73]
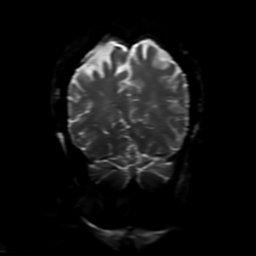
[im 73/73]
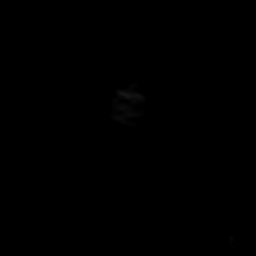

[Series 4: FLAIR · sagittal · 5.0mm · 0.23mm/px · 1 of 23 slices shown (1 of 2)]
[im 1/23]
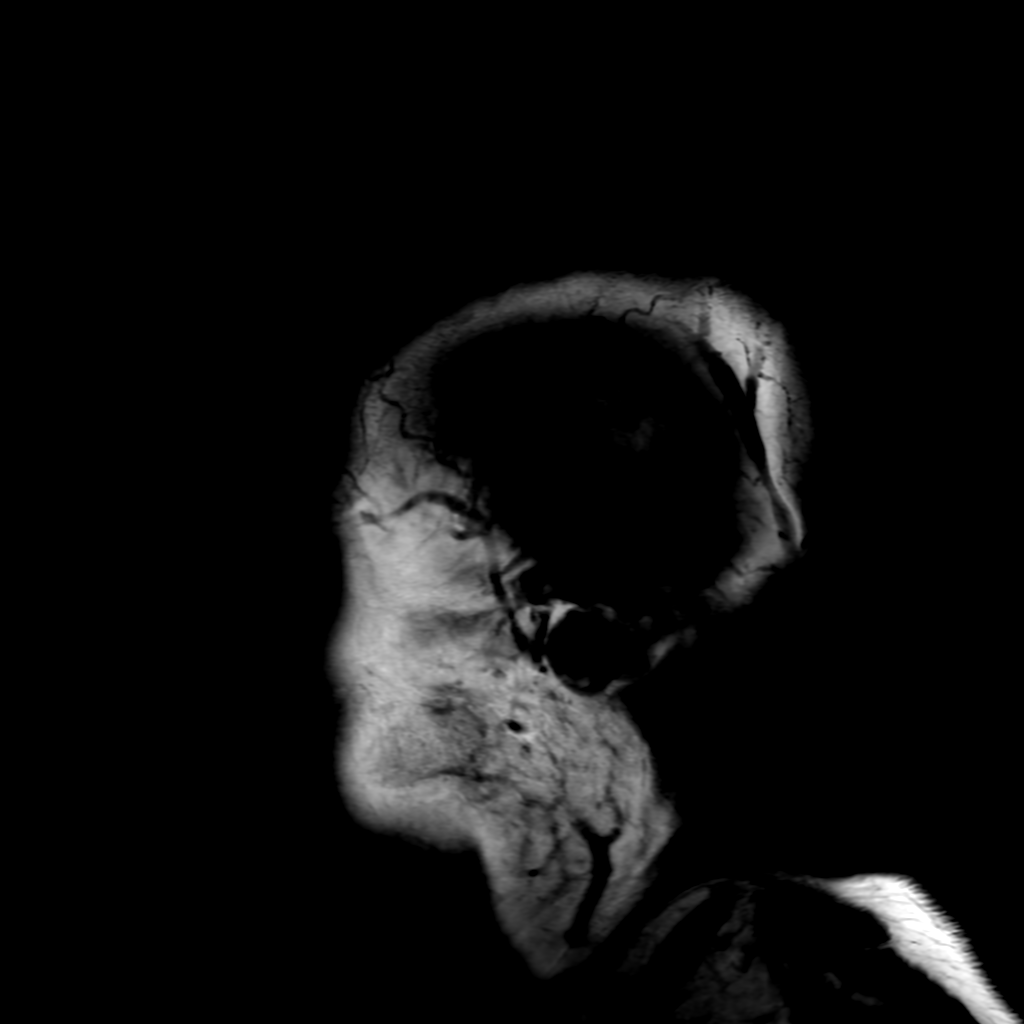

[Series 5: T2 · axial · 5.0mm · 0.23mm/px · 1 of 26 slices shown]
[im 1/26]
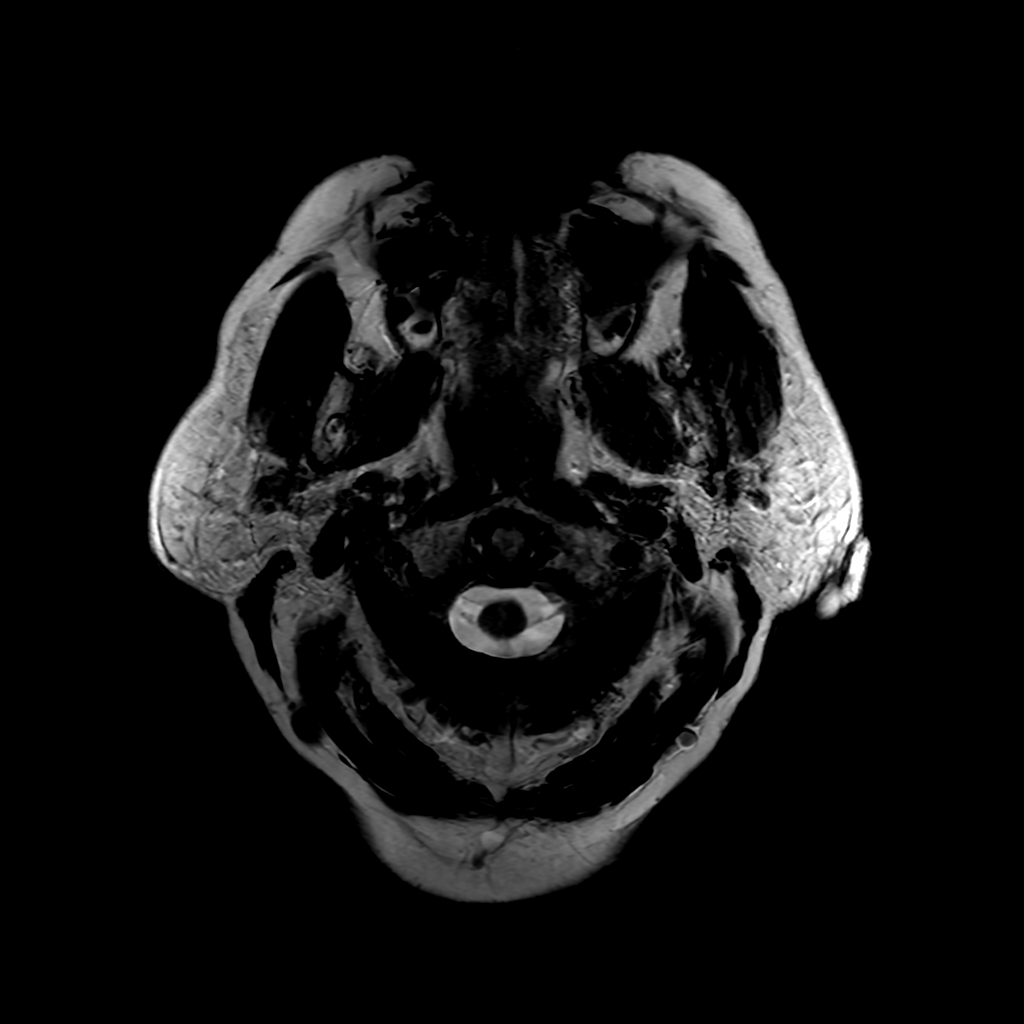

[Series 6: FLAIR · axial · 4.0mm · 0.47mm/px · z∈[-98,+51]mm · 2 of 35 slices shown (2 of 2)]
[im 1/35]
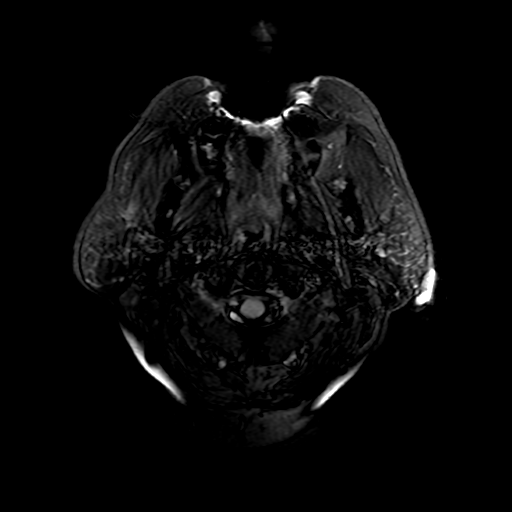
[im 35/35]
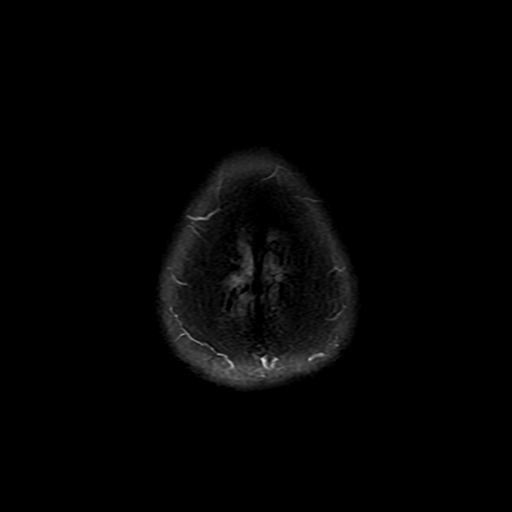

[Series 12: FLAIR post-contrast · sagittal · 5.0mm · 0.23mm/px · 1 of 23 slices shown]
[im 1/23]
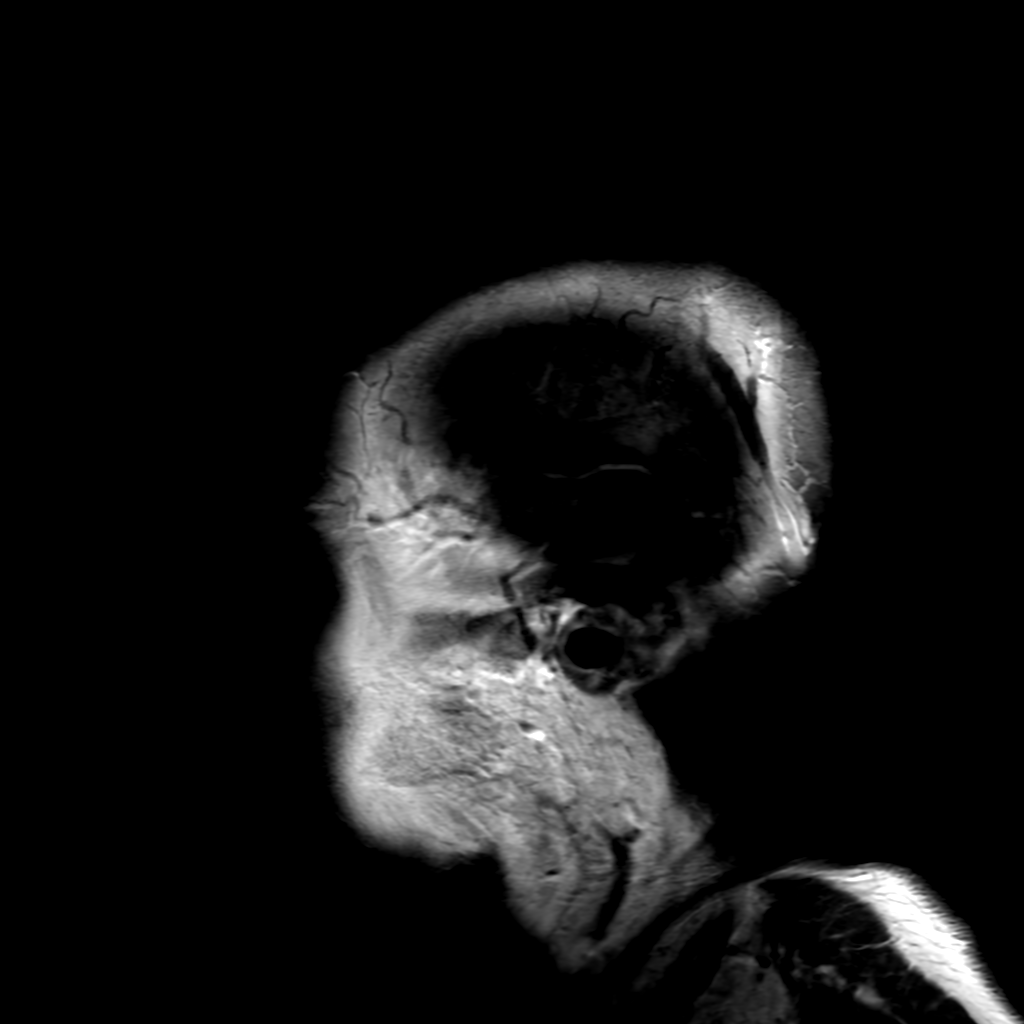

[Series 250: ADC · axial · 3.0mm · 0.94mm/px · z∈[-98,+61]mm · 3 of 54 slices shown (1 of 2)]
[im 1/54]
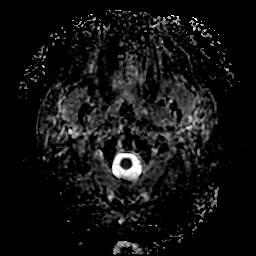
[im 27/54]
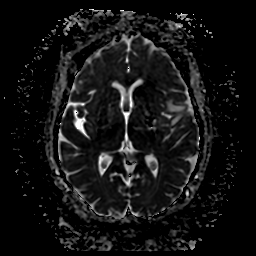
[im 54/54]
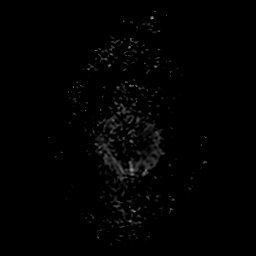

[Series 350: ADC · coronal · 4.0mm · 0.94mm/px · 2 of 37 slices shown (2 of 2)]
[im 1/37]
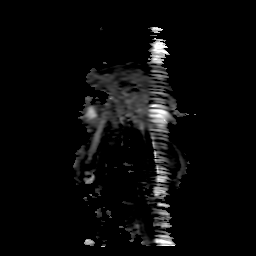
[im 37/37]
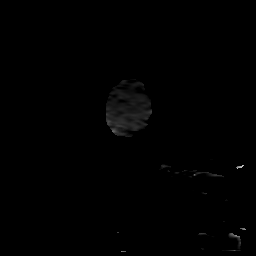

[23 of 48 positions shown; findings below may reference images not displayed]

FINDINGS: Brain:

Mild intermittent motion degradation.

Mild generalized cerebral and cerebellar atrophy.

1.5 x 1.4 x 1.2 cm enhancing mass along the mid left frontal lobe
convexity (series 10, image 39) (series 11, image 16). The lesion
abuts the overlying dura. Centrally, the mass demonstrates relative
hypoenhancement, which may reflect necrosis. There is moderate
surrounding vasogenic edema. Regional mass effect. Trace 1-2 mm
rightward midline shift.

No second enhancing intracranial lesion is identified.

No cortical encephalomalacia is identified. No significant white
matter disease for age.

There is no acute infarct.

No extra-axial fluid collection.

No midline shift.

Vascular: Expected proximal arterial flow voids.

Skull and upper cervical spine: No focal marrow lesion.

Sinuses/Orbits: Visualized orbits show no acute finding. Bilateral
lens replacements. Trace bilateral ethmoid sinus mucosal thickening.
Small fluid level and mild mucosal thickening within the right
sphenoid sinus.
IMPRESSION: Mildly motion degraded exam.

[DATE] x 1.4 cm enhancing lesion along the mid left frontal lobe
convexity, as described and likely reflecting a metastasis given the
patient's history of lung malignancy. Moderate surrounding vasogenic
edema. Regional mass effect. Trace 1-2 mm rightward midline shift.

No second enhancing intracranial lesion is identified.

Mild generalized parenchymal atrophy.

Paranasal sinus disease as described.

## 2021-03-04 MED ORDER — DEXAMETHASONE SODIUM PHOSPHATE 10 MG/ML IJ SOLN
10.0000 mg | Freq: Once | INTRAMUSCULAR | Status: AC
  Administered 2021-03-04: 10 mg via INTRAVENOUS
  Filled 2021-03-04: qty 1

## 2021-03-04 MED ORDER — GADOBUTROL 1 MMOL/ML IV SOLN
7.0000 mL | Freq: Once | INTRAVENOUS | Status: AC | PRN
  Administered 2021-03-04: 7 mL via INTRAVENOUS

## 2021-03-04 MED ORDER — SODIUM CHLORIDE 0.9 % IV SOLN: 2000.0000 mg | Freq: Once | INTRAVENOUS | Status: DC

## 2021-03-04 MED ORDER — LEVETIRACETAM IN NACL 1000 MG/100ML IV SOLN
1000.0000 mg | Freq: Once | INTRAVENOUS | Status: AC
  Administered 2021-03-04: 1000 mg via INTRAVENOUS
  Filled 2021-03-04: qty 100

## 2021-03-04 MED ORDER — LEVETIRACETAM 500 MG PO TABS: 500.0000 mg | ORAL_TABLET | Freq: Two times a day (BID) | ORAL | 0 refills | Status: DC

## 2021-03-04 MED ORDER — SODIUM CHLORIDE 0.9% FLUSH
3.0000 mL | Freq: Once | INTRAVENOUS | Status: AC
  Administered 2021-03-04: 3 mL via INTRAVENOUS

## 2021-03-04 NOTE — Discharge Instructions (Addendum)
You came to the emergency department today to be evaluated for your episode of aphasia.  You were deemed to not be having an acute stroke today.  The MRI obtained while you are in the emergency department did show a 1.5 x 1.4 x 1.2 cm enhancing mass along the mid left frontal lobe that is concerning for a metastatic brain tumor.  Due to this I have given you an ambulatory referral to the neuro oncologist Dr. Cecil Cobbs.  If you do not hear from his office in 3 business days.  Please call his office to schedule an appointment.    Due to your symptoms today neurology has recommended starting you on Keppra 500 mg twice daily.  You will need to refrain from driving or operating heavy machinery until you can be evaluated by the neuro oncologist.  Get help right away if: You have a seizure, particularly if this is a new symptom. You have a fever. You have new symptoms, such as vision problems or trouble walking.

## 2021-03-04 NOTE — Consult Note (Signed)
NEUROLOGY CONSULTATION NOTE   Date of service: March 04, 2021 Patient Name: Marie Hensley MRN:  [id number] DOB:  09-08-1946 Reason for consult: "Aphasia" Requesting Provider: No att. providers found _ _ _   _ __   _ __ _ _  __ __   _ __   __ _  History of Present Illness  Candis Kabel is a 75 y.o. female with PMH significant for aortic atherosclerosis, Hld, Pulm nodule, HTN, essential tremor who presents with acute onset Aphasia that started today at 1220 on 03/04/21. It is only appreciable when she is having a conversation.   mRS: 0 tPA/Thrombectomy: likely a brain mets, not offered due to low suspicion for stroke. NIHSS components Score: Comment  1a Level of Conscious 0'[x]'  1'[]'  2'[]'  3'[]'      1b LOC Questions 0'[x]'  1'[]'  2'[]'       1c LOC Commands 0'[x]'  1'[]'  2'[]'       2 Best Gaze 0'[x]'  1'[]'  2'[]'       3 Visual 0'[x]'  1'[]'  2'[]'  3'[]'      4 Facial Palsy 0'[x]'  1'[]'  2'[]'  3'[]'      5a Motor Arm - left 0'[x]'  1'[]'  2'[]'  3'[]'  4'[]'  UN'[]'    5b Motor Arm - Right 0'[x]'  1'[]'  2'[]'  3'[]'  4'[]'  UN'[]'    6a Motor Leg - Left 0'[x]'  1'[]'  2'[]'  3'[]'  4'[]'  UN'[]'    6b Motor Leg - Right 0'[x]'  1'[]'  2'[]'  3'[]'  4'[]'  UN'[]'    7 Limb Ataxia 0'[x]'  1'[]'  2'[]'  3'[]'  UN'[]'     8 Sensory 0'[x]'  1'[]'  2'[]'  UN'[]'      9 Best Language 0'[]'  1'[x]'  2'[]'  3'[]'      10 Dysarthria 0'[x]'  1'[]'  2'[]'  UN'[]'      11 Extinct. and Inattention 0'[x]'  1'[]'  2'[]'       TOTAL: 1       ROS   Constitutional Denies weight loss, fever and chills.   HEENT Denies changes in vision and hearing.   Respiratory Denies SOB and cough.   CV Denies palpitations and CP   GI Denies abdominal pain, nausea, vomiting and diarrhea.   GU Denies dysuria and urinary frequency.   MSK Denies myalgia and joint pain.   Skin Denies rash and pruritus.   Neurological Denies headache and syncope.   Psychiatric Denies recent changes in mood. Denies anxiety and depression.    Past History   Past Medical History:  Diagnosis Date   Arthritis    Depression    Dyspnea    WITH EXERTION-SEEING DR [name]   HTN (hypertension)     Hyperlipemia    Past Surgical History:  Procedure Laterality Date   APPENDECTOMY  1982   EYE SURGERY Bilateral    CATARACTS   OOPHORECTOMY Right    VIDEO BRONCHOSCOPY WITH ENDOBRONCHIAL NAVIGATION Left 12/23/2020   Procedure: VIDEO BRONCHOSCOPY WITH ENDOBRONCHIAL NAVIGATION;  Surgeon: Tyler Pita, MD;  Location: [facility] ORS;  Service: Pulmonary;  Laterality: Left;   VIDEO BRONCHOSCOPY WITH ENDOBRONCHIAL NAVIGATION Left 02/01/2021   Procedure: ROBOTIC ASSISTED VIDEO BRONCHOSCOPY WITH ENDOBRONCHIAL NAVIGATION;  Surgeon: Tyler Pita, MD;  Location: [facility];  Service: Pulmonary;  Laterality: Left;   VIDEO BRONCHOSCOPY WITH ENDOBRONCHIAL ULTRASOUND Left 12/23/2020   Procedure: VIDEO BRONCHOSCOPY WITH ENDOBRONCHIAL ULTRASOUND;  Surgeon: Tyler Pita, MD;  Location: [facility];  Service: Pulmonary;  Laterality: Left;   VIDEO BRONCHOSCOPY WITH ENDOBRONCHIAL ULTRASOUND Left 02/01/2021   Procedure: ROBOTIC ASSITSTED VIDEO BRONCHOSCOPY WITH ENDOBRONCHIAL ULTRASOUND;  Surgeon: Tyler Pita, MD;  Location: [facility];  Service: Pulmonary;  Laterality: Left;   Family History  Problem Relation Age of Onset   Addison's disease Daughter    Hypertension Mother        died at 56   Alcohol abuse Father    Social History   Socioeconomic History   Marital status: Single    Spouse name: Not on file   Number of children: 3   Years of education: high school    Highest education level: Not on file  Occupational History   Occupation: retired  Tobacco Use   Smoking status: Former    Packs/day: 0.50    Years: 20.00    Pack years: 10.00    Types: Cigarettes    Quit date: 10/27/1989    Years since quitting: 31.3   Smokeless tobacco: Never   Tobacco comments:    Smoked off and on in 20 year period  Vaping Use   Vaping Use: Never used  Substance and Sexual Activity   Alcohol use: Yes    Comment: 3-4 times a week, 2oz liquor    Drug use: Never   Sexual activity: Not Currently     Birth control/protection: Post-menopausal  Other Topics Concern   Not on file  Social History Narrative   She has discussed advanced directives with her.  She would want CPR and intubation if chance of recovery.   11/19/20   From: [place], moved for work 1998   Living: alone   Work: retired Energy manager of [employer]: 3 children - [name], [name], [name] - 5 grandchildren, 1 great-grandchild      Enjoys: shop, casino, cruise travel      Exercise: not as much due to the fatigue   Diet: tries to eat healthy, but eats what she wants      Safety   Seat belts: Yes    Guns: No   Safe in relationships: Yes    Social Determinants of Radio broadcast assistant Strain: Not on file  Food Insecurity: Not on file  Transportation Needs: Not on file  Physical Activity: Not on file  Stress: Not on file  Social Connections: Not on file   No Known Allergies  Medications  (Not in a hospital admission)    Vitals   There were no vitals filed for this visit.   There is no height or weight on file to calculate BMI.  Physical Exam   General: Laying comfortably in bed; in no acute distress.  HENT: Normal oropharynx and mucosa. Normal external appearance of ears and nose.  Neck: Supple, no pain or tenderness  CV: No JVD. No peripheral edema.  Pulmonary: Symmetric Chest rise. Normal respiratory effort.  Abdomen: Soft to touch, non-tender.  Ext: No cyanosis, edema, or deformity  Skin: No rash. Normal palpation of skin.   Musculoskeletal: Normal digits and nails by inspection. No clubbing.   Neurologic Examination  Mental status/Cognition: Alert, oriented to self, place, month and year, good attention.  Speech/language: Fluent, comprehension intact, object naming intact, repetition intact. Has mild aphasia that is notable when she is trying to have a conversation. Gets stuck on words frequently. Cranial nerves:   CN II Pupils equal and reactive to light, no VF deficits    CN  III,IV,VI EOM intact, no gaze preference or deviation, no nystagmus    CN V normal sensation in V1, V2, and V3 segments bilaterally    CN VII no asymmetry, no nasolabial fold flattening    CN VIII normal hearing to speech  CN IX & X normal palatal elevation, no uvular deviation    CN XI 5/5 head turn and 5/5 shoulder shrug bilaterally    CN XII midline tongue protrusion    Motor:  Muscle bulk: poor, tone normal, pronator drift none tremor none Mvmt Root Nerve  Muscle Right Left Comments  SA C5/6 Ax Deltoid     EF C5/6 Mc Biceps 5 5   EE C6/7/8 Rad Triceps 5 5   WF C6/7 Med FCR     WE C7/8 PIN ECU     F Ab C8/T1 U ADM/FDI 5 5   HF L1/2/3 Fem Illopsoas 5 5   KE L2/3/4 Fem Quad     DF L4/5 D Peron Tib Ant 5 5   PF S1/2 Tibial Grc/Sol 5 5    Reflexes:  Right Left Comments  Pectoralis      Biceps (C5/6) 2 2   Brachioradialis (C5/6) 2 2    Triceps (C6/7) 2 2    Patellar (L3/4) 2 2    Achilles (S1)      Hoffman      Plantar     Jaw jerk    Sensation:  Light touch intact   Pin prick    Temperature    Vibration   Proprioception    Coordination/Complex Motor:  - Finger to Nose intact BL - Heel to shin intact BL - Rapid alternating movement intact - Gait: deferred.  Labs   CBC: No results for input(s): WBC, NEUTROABS, HGB, HCT, MCV, PLT in the last 168 hours.  Basic Metabolic Panel:  Lab Results  Component Value Date   NA 137 02/01/2021   K 4.1 02/01/2021   CO2 28 11/19/2020   GLUCOSE 129 (H) 02/01/2021   BUN 46 (H) 02/01/2021   CREATININE 1.60 (H) 02/01/2021   CALCIUM 9.8 11/19/2020   Lipid Panel:  Lab Results  Component Value Date   LDLCALC 101 (H) 11/19/2020   HgbA1c: No results found for: HGBA1C Urine Drug Screen: No results found for: LABOPIA, COCAINSCRNUR, LABBENZ, AMPHETMU, THCU, LABBARB  Alcohol Level No results found for: [place]  CT Head without contrast(personally reviewed): 5.2 x 4.3 x 5.6 cm region of vasogenic edema within the mid to posterior  left frontal lobe. Findings are highly suspicious for an underlying mass/metastasis at this site given the known history of lung cancer. A contrast-enhanced brain MRI is recommended for further evaluation.  MRI Brain with and without contrast: Pending   Impression   Marie Hensley is a 75 y.o. female with PMH significant for aortic atherosclerosis, Hld, Pulm nodule, HTN, essential tremor who presents with acute onset Aphasia that started today at 1220 on 03/04/21. It is only appreciable when she is having a conversation. CT Head highly concerning for a brain mass/mets. Will get MRI Brain with and without contrast to clarify.  Impression: Aphasia Brain metastasis.  Recommendations  - MRI Brain with and without contrast - Keppra 2G IV once - rEEG - Keppra 527m BID. - Dexamethasone 115monce if MRI demonstrates a met/mass ______________________________________________________________________  Plan discussed with Dr. JoDorie Rank Thank you for the opportunity to take part in the care of this patient. If you have any further questions, please contact the neurology consultation attending.  Signed,  [place]ager Number [phone] _ _   _ __   _ __ _ _  __ __   _ __   __ _

## 2021-03-04 NOTE — ED Notes (Signed)
Patient verbalizes understanding of discharge instructions. Prescriptions reviewed and follow-up care reviewed. Opportunity for questioning and answers were provided. Armband removed by staff, pt discharged from ED via wheelchair.

## 2021-03-04 NOTE — Code Documentation (Signed)
Stroke Response Nurse Documentation Code Documentation  Marie Hensley is a 75 y.o. female arriving to [facility] ED via [facility] on 03/04/2021 with past medical hx of lung nodule, HTN. Code stroke was activated by EMS. Patient from home where she was LKW at 1220 and now complaining of trouble word finding. Pt reports, "I was just sitting there and my tongue started to feel like it was in knots."   Stroke team at the bedside on patient arrival. Labs drawn and patient cleared for CT by Dr. Tomi Bamberger. Patient to CT with team. NIHSS 1, see documentation for details and code stroke times. Patient with Expressive aphasia  on exam. The following imaging was completed: CT. Patient is not a candidate for tPA due to contraindication. See Provider note.   Care/Plan: q2 mNIHSS/VS. Bedside handoff with ED RN Abe People.    Kathrin Greathouse  Stroke Response RN

## 2021-03-04 NOTE — ED Provider Notes (Signed)
Care of patient assumed from [place] at 1526.  Agree with history, physical exam and plan.  See their note for further details. Briefly, patient is a 75 year old female who presents to the emergency department with a sudden onset of aphasia that occurred earlier today at lunchtime.  Noncontrast head CT showed a 5.2 x 4.3 x 5.6 cm region of vasogenic edema within the mid to posterior left frontal lobe.  Findings are highly suspicious for underlying mass versus metastasis known history of lung cancer.  MRI brain with and without contrast was obtained to further evaluate suspicious mass.  Arise pending at this time.  Upon my assessment patient reports improvement in aphasia.  Patient able to speak in complete sentences without difficulty.  Patient able to move all limbs without difficulty.  Patient has no slurred speech or facial asymmetry.  MRI obtained shows 1.5 x 1.4 cm enhancing lesion along the mid left frontal lobe convexity, as described and likely reflecting a metastasis given the patient's history of lung malignancy. Moderate surrounding vasogenic edema. Regional mass effect. Trace 1-2 mm rightward midline shift.  Per chart review neurologist Dr.Khaliqdina recommended patient receiving dexamethasone for now grams once if MRI demonstrates a med/mass.  Patient was given 10 mg dexamethasone.  Will reconsult neurology for further recommendations patient on patient's MRI findings.  1900 spoke with neurologist Dr.Khaliqdina.  Recommends that patient be started on Keppra 500 mg twice daily.  Patient will need to have driving restrictions.  Patient will need follow-up with neuro oncologist Dr. Cecil Cobbs.  Patient and patient's family were at bedside were informed of MRI results.  All questions were answered.  Driving precautions were explained to patient.  Keppra medication and follow-up with neuro oncologist were explained to patient.  Patient was given strict return precautions.  Patient and  patient's family member at bedside expressed understanding of all instructions.  Ambulatory referral to Dr. Mickeal Skinner was placed.  Patient given prescription for Keppra 500 mg twice daily.  Patient is hemodynamically stable at this time.  Patient has no focal neurological deficits.  Will discharge patient at this time.   Loni Beckwith, PA-C 03/05/21 0304    Fredia Sorrow, MD 03/11/21 [phone]

## 2021-03-04 NOTE — ED Notes (Signed)
Pt placed on 2 lpm O2 via [place] for O2 saturation of 90-91% on RA

## 2021-03-04 NOTE — ED Provider Notes (Signed)
[facility] EMERGENCY DEPARTMENT Provider Note   CSN: [id number] Arrival date & time: 03/04/21  1318  An emergency department physician performed an initial assessment on this suspected stroke patient at 36.  History Chief Complaint  Patient presents with   Code Stroke    Marie Hensley is a 75 y.o. female history of hypertension, hyperlipidemia, arthritis, pulmonary nodule.  Patient arrives for aphasia onset at lunchtime today, difficulty expressing herself.  No other complaints.  Code stroke activated prior to arrival.  Patient met by neurology team and Dr. Tomi Bamberger at the bridge and taken immediately to CT scan.  CT imaging was concerning for mass and MRI was ordered.  On my evaluation patient is alert oriented no acute distress expressive aphasia is noted.  She denies any pain reports she is otherwise feeling well.  Vital signs are stable.  HPI     Past Medical History:  Diagnosis Date   Arthritis    Depression    Dyspnea    WITH EXERTION-SEEING DR Marie Hensley   HTN (hypertension)    Hyperlipemia     Patient Active Problem List   Diagnosis Date Noted   Essential tremor 03/03/2021   Atherosclerosis of aorta ([place]) 03/03/2021   Pulmonary nodule 02/23/2021   Dyspnea on exertion 11/19/2020   Chronic kidney disease, stage 4 (severe) ([place]) 04/28/2020   Essential hypertension 10/28/2019   Hyperlipidemia 10/28/2019   Chronic pain of both shoulders 10/28/2019   Depression, major, single episode, complete remission ([facility]) 10/28/2019    Past Surgical History:  Procedure Laterality Date   APPENDECTOMY  1982   EYE SURGERY Bilateral    CATARACTS   OOPHORECTOMY Right    VIDEO BRONCHOSCOPY WITH ENDOBRONCHIAL NAVIGATION Left 12/23/2020   Procedure: VIDEO BRONCHOSCOPY WITH ENDOBRONCHIAL NAVIGATION;  Surgeon: Tyler Pita, MD;  Location: [facility];  Service: Pulmonary;  Laterality: Left;   VIDEO BRONCHOSCOPY WITH ENDOBRONCHIAL NAVIGATION Left 02/01/2021    Procedure: ROBOTIC ASSISTED VIDEO BRONCHOSCOPY WITH ENDOBRONCHIAL NAVIGATION;  Surgeon: Tyler Pita, MD;  Location: [facility] OR[facility];  Service: Pulmonary;  Laterality: Left;   VIDEO BRONCHOSCOPY WITH ENDOBRONCHIAL ULTRASOUND Left 12/23/2020   Procedure: VIDEO BRONCHOSCOPY WITH ENDOBRONCHIAL ULTRASOUND;  Surgeon: Tyler Pita, MD;  Location: [facility];  Service: Pulmonary;  Laterality: Left;   VIDEO BRONCHOSCOPY WITH ENDOBRONCHIAL ULTRASOUND Left 02/01/2021   Procedure: ROBOTIC ASSITSTED VIDEO BRONCHOSCOPY WITH ENDOBRONCHIAL ULTRASOUND;  Surgeon: Tyler Pita, MD;  Location: [facility];  Service: Pulmonary;  Laterality: Left;     OB History   No obstetric history on file.     Family History  Problem Relation Age of Onset   Addison's disease Daughter    Hypertension Mother        died at 38   Alcohol abuse Father     Social History   Tobacco Use   Smoking status: Former    Packs/day: 0.50    Years: 20.00    Pack years: 10.00    Types: Cigarettes    Quit date: 10/27/1989    Years since quitting: 31.3   Smokeless tobacco: Never   Tobacco comments:    Smoked off and on in 20 year period  Vaping Use   Vaping Use: Never used  Substance Use Topics   Alcohol use: Yes    Comment: 3-4 times a week, 2oz liquor    Drug use: Never    Home Medications Prior to Admission medications   Medication Sig Start Date End Date Taking? Authorizing Provider  acetaminophen (TYLENOL)  500 MG tablet Take 1,000 mg by mouth every 6 (six) hours as needed for mild pain (Arthritis).    [provider]  atorvastatin (LIPITOR) 40 MG tablet Take 1 tablet (40 mg total) by mouth at bedtime. 03/03/21   [name], MD  Cetirizine HCl (ZYRTEC PO) Take 10 mg by mouth daily as needed (Allergy).    [provider]  citalopram (CELEXA) 20 MG tablet Take 1 tablet (20 mg total) by mouth at bedtime. 02/12/21   [name], MD  fluticasone (FLONASE) 50 MCG/ACT nasal spray Place 1 spray  into both nostrils daily.    [provider]  lisinopril-hydrochlorothiazide (ZESTORETIC) 10-12.5 MG tablet Take 1 tablet by mouth daily. Patient taking differently: Take 1 tablet by mouth at bedtime. 11/19/20   [name], MD  Loperamide HCl (IMODIUM PO) Take 2 mg by mouth daily as needed (Diarrhea).    [provider]  Multiple Vitamin (MULTIVITAMIN WITH MINERALS) TABS tablet Take 1 tablet by mouth daily.    [provider]  propranolol ER (INDERAL LA) 60 MG 24 hr capsule Take 1 capsule (60 mg total) by mouth at bedtime. 03/03/21   [name], MD    Allergies    Patient has no known allergies.  Review of Systems   Review of Systems Ten systems are reviewed and are negative for acute change except as noted in the HPI  Physical Exam Updated Vital Signs BP 106/66   Pulse 62   Temp 98.2 F (36.8 C) (Oral)   Resp 17   SpO2 95%   Physical Exam Constitutional:      General: She is not in acute distress.    Appearance: Normal appearance. She is well-developed. She is not ill-appearing or diaphoretic.  HENT:     Head: Normocephalic and atraumatic.  Eyes:     General: Vision grossly intact. Gaze aligned appropriately.     Pupils: Pupils are equal, round, and reactive to light.  Neck:     Trachea: Trachea and phonation normal.  Pulmonary:     Effort: Pulmonary effort is normal. No respiratory distress.  Abdominal:     General: There is no distension.     Palpations: Abdomen is soft.     Tenderness: There is no abdominal tenderness. There is no guarding or rebound.  Musculoskeletal:        General: Normal range of motion.     Cervical back: Normal range of motion.  Skin:    General: Skin is warm and dry.  Neurological:     Mental Status: She is alert.     GCS: GCS eye subscore is 4. GCS verbal subscore is 5. GCS motor subscore is 6.     Comments: Answers questions appropriately, mild expressive aphasia Major Cranial nerves without deficit, no  facial droop Normal strength in upper and lower extremities bilaterally including dorsiflexion and plantar flexion, strong and equal grip strength Sensation normal to light and sharp touch Moves extremities without ataxia, coordination intact   Psychiatric:        Behavior: Behavior normal.    ED Results / Procedures / Treatments   Labs (all labs ordered are listed, but only abnormal results are displayed) Labs Reviewed  COMPREHENSIVE METABOLIC PANEL - Abnormal; Notable for the following components:      Result Value   Glucose, Bld 154 (*)    BUN 34 (*)    Creatinine, Ser 1.44 (*)    GFR, Estimated 38 (*)  All other components within normal limits  I-STAT CHEM 8, ED - Abnormal; Notable for the following components:   BUN 33 (*)    Creatinine, Ser 1.40 (*)    Glucose, Bld 150 (*)    TCO2 20 (*)    All other components within normal limits  CBG MONITORING, ED - Abnormal; Notable for the following components:   Glucose-Capillary 146 (*)    All other components within normal limits  PROTIME-INR  APTT  CBC  DIFFERENTIAL    EKG EKG Interpretation  Date/Time:  Thursday March 04 2021 13:48:42 EDT Ventricular Rate:  70 PR Interval:  167 QRS Duration: 96 QT Interval:  428 QTC Calculation: 462 R Axis:   1 Text Interpretation: Sinus rhythm Borderline T abnormalities, anterior leads No significant change since last tracing Confirmed by Dorie Rank [phone]) on 03/04/2021 1:52:06 PM  Radiology CT HEAD CODE STROKE WO CONTRAST  Result Date: 03/04/2021 CLINICAL DATA:  Code stroke. Stroke/TIA, assess intracranial arteries. Additional history obtained from neurology physician: Patient with history of lung cancer, aphasia. EXAM: CT HEAD WITHOUT CONTRAST TECHNIQUE: Contiguous axial images were obtained from the base of the skull through the vertex without intravenous contrast. COMPARISON:  No pertinent prior exams available for comparison. FINDINGS: Brain: Cerebral volume is normal for age.  There is a region of vasogenic edema within the mid to posterior left frontal lobe, measuring 5.2 x 4.3 x 5.6 cm (AP x TV x CC). No loss of gray-white differentiation is appreciated. Local mass effect regional cerebral sulcal effacement. No significant ventricular effacement or midline shift. Findings are highly suspicious for an underlying mass/metastasis at this site given the known history of lung cancer. There is no acute intracranial hemorrhage. No extra-axial fluid collection. Vascular: No hyperdense vessel. Skull: Normal. Negative for fracture or focal suspicious osseous lesion. Sinuses/Orbits: Visualized orbits show no acute finding. Small fluid level within the right sphenoid sinus. Background mild right sphenoid sinus mucosal thickening. Trace bilateral ethmoid sinus mucosal thickening. These results were called by telephone at the time of interpretation on 03/04/2021 at 1:35 pm to provider Dr. Lorrin Goodell, who verbally acknowledged these results. IMPRESSION: 5.2 x 4.3 x 5.6 cm region of vasogenic edema within the mid to posterior left frontal lobe. Findings are highly suspicious for an underlying mass/metastasis at this site given the known history of lung cancer. A contrast-enhanced brain MRI is recommended for further evaluation. Regional mass effect without significant ventricular effacement or midline shift. Paranasal sinus disease, as described. Electronically Signed   By: Kellie Simmering DO   On: 03/04/2021 13:41    Procedures Procedures   Medications Ordered in ED Medications  sodium chloride flush (NS) 0.9 % injection 3 mL (3 mLs Intravenous Given 03/04/21 1357)  levETIRAcetam (KEPPRA) IVPB 1000 mg/100 mL premix (0 mg Intravenous Stopped 03/04/21 1427)    And  levETIRAcetam (KEPPRA) IVPB 1000 mg/100 mL premix (0 mg Intravenous Stopped 03/04/21 1426)    ED Course  I have reviewed the triage vital signs and the nursing notes.  Pertinent labs & imaging results that were available during my  care of the patient were reviewed by me and considered in my medical decision making (see chart for details).    MDM Rules/Calculators/A&P                         Additional history obtained from: Nursing notes from this visit. Review of electronic medical records. ------------------ I reviewed and interpreted labs which include: CMP  shows elevated creatinine and BUN similar to prior.  No emergent electrolyte derangement, AKI or LFT elevations.  No gap. APTT and PT/INR within normal limits. CBC within normal meds. CBG 146  CT Head:  IMPRESSION:  5.2 x 4.3 x 5.6 cm region of vasogenic edema within the mid to  posterior left frontal lobe. Findings are highly suspicious for an  underlying mass/metastasis at this site given the known history of  lung cancer. A contrast-enhanced brain MRI is recommended for  further evaluation.     Regional mass effect without significant ventricular effacement or  midline shift.     Paranasal sinus disease, as described.    3:34 PM: Patient reassessed she is resting comfortably bed no acute distress watching TV.  She states understanding of plan of care.  No new complaints - Care handoff given to [facility] PA_C at shift change.  Plan of care is to follow-up on MRI results and then reconsult neurology for further recommendations and likely neurosurgery for recommendations.  Case discuss Dr. Tomi Bamberger who agrees with plan.  Note: Portions of this report may have been transcribed using voice recognition software. Every effort was made to ensure accuracy; however, inadvertent computerized transcription errors may still be present.  Final Clinical Impression(s) / ED Diagnoses Final diagnoses:  None    Rx / DC Orders ED Discharge Orders     None        Gari Crown 03/04/21 1535    Dorie Rank, MD 03/08/21 1354

## 2021-03-08 ENCOUNTER — Telehealth: Payer: Self-pay | Admitting: Internal Medicine

## 2021-03-08 NOTE — Telephone Encounter (Signed)
Received a new pt referral from Debbe Mounts, [place] for metastatic brain tumor. MS. Dupre returned my call and as been scheduled to see Dr. Mickeal Skinner on 6/28 at 12pm. Pt aware to arrive 15 minutes early.

## 2021-03-09 ENCOUNTER — Inpatient Hospital Stay: Payer: Medicare HMO | Attending: Internal Medicine | Admitting: Internal Medicine

## 2021-03-09 ENCOUNTER — Other Ambulatory Visit: Payer: Self-pay

## 2021-03-09 DIAGNOSIS — Z87891 Personal history of nicotine dependence: Secondary | ICD-10-CM | POA: Diagnosis not present

## 2021-03-09 DIAGNOSIS — C7931 Secondary malignant neoplasm of brain: Secondary | ICD-10-CM | POA: Diagnosis not present

## 2021-03-09 DIAGNOSIS — R569 Unspecified convulsions: Secondary | ICD-10-CM | POA: Insufficient documentation

## 2021-03-09 DIAGNOSIS — C801 Malignant (primary) neoplasm, unspecified: Secondary | ICD-10-CM

## 2021-03-09 DIAGNOSIS — R918 Other nonspecific abnormal finding of lung field: Secondary | ICD-10-CM | POA: Insufficient documentation

## 2021-03-09 DIAGNOSIS — Z79899 Other long term (current) drug therapy: Secondary | ICD-10-CM | POA: Insufficient documentation

## 2021-03-09 MED ORDER — DIPHENHYDRAMINE HCL 25 MG PO CAPS
ORAL_CAPSULE | ORAL | Status: AC
  Filled 2021-03-09: qty 1

## 2021-03-09 MED ORDER — ACETAMINOPHEN 325 MG PO TABS
ORAL_TABLET | ORAL | Status: AC
  Filled 2021-03-09: qty 2

## 2021-03-09 NOTE — Progress Notes (Signed)
[facility] [address] [place], [place] [address] [phone]   New Patient Evaluation  Date of Service: 03/09/21 Patient Name: Marie Hensley Patient MRN: [id number] Patient DOB: 1945-11-21 Provider: Ventura Sellers, MD  Identifying Statement:  Marie Hensley is a 75 y.o. female with Brain metastasis ([place])  Focal seizure [facility]) who presents for initial consultation and evaluation regarding cancer associated neurologic deficits.    Referring Provider: [name], MD [place],  [place] [address]  Primary Cancer: Lung, pending dx  CNS Oncologic History: 03/04/21: Presents with seizure, MRI demonstrates likely L frontal metastasis  History of Present Illness: The patient's records from the referring physician were obtained and reviewed and the patient interviewed to confirm this HPI.  Marie Hensley presented to medical attention with sudden onset loss of speech output.  This was noticed by family on 03/04/21, she had been in normal state of health at home immediately prior.  She returned to baseline after several hours and ED visit, CNS imaging demonstrated an enhancing mass in the left frontal lobe.  She was started on Keppra 500mg  twice per day, no recurrence of seizure activity since that time.  She does acknowledge "some difficulty" with getting words out at times.  Has known lung mass, underwent bronchoscopy x2 without formal diagnostic result.  Is considering IR biopsy next month given very high likelihood of primary lung malignancy.     Medications: Current Outpatient Medications on File Prior to Visit  Medication Sig Dispense Refill   acetaminophen (TYLENOL) 500 MG tablet Take 1,000 mg by mouth every 6 (six) hours as needed for mild pain (or arthritis).     atorvastatin (LIPITOR) 40 MG tablet Take 1 tablet (40 mg total) by mouth at bedtime. 90 tablet 3   cetirizine (ZYRTEC) 10 MG tablet Take 10 mg by mouth  daily as needed for allergies or rhinitis.     citalopram (CELEXA) 20 MG tablet Take 1 tablet (20 mg total) by mouth at bedtime. 90 tablet 1   fluticasone (FLONASE) 50 MCG/ACT nasal spray Place 1 spray into both nostrils daily as needed (for seasonal allergies).     levETIRAcetam (KEPPRA) 500 MG tablet Take 1 tablet (500 mg total) by mouth 2 (two) times daily. 60 tablet 0   lisinopril-hydrochlorothiazide (ZESTORETIC) 10-12.5 MG tablet Take 1 tablet by mouth daily. (Patient taking differently: Take 1 tablet by mouth at bedtime.) 90 tablet 3   loperamide (IMODIUM A-D) 2 MG tablet Take 2 mg by mouth 2 (two) times daily as needed for diarrhea or loose stools.     propranolol ER (INDERAL LA) 60 MG 24 hr capsule Take 1 capsule (60 mg total) by mouth at bedtime. (Patient taking differently: Take 60 mg by mouth in the morning.) 90 capsule 3   No current facility-administered medications on file prior to visit.    Allergies: No Known Allergies Past Medical History:  Past Medical History:  Diagnosis Date   Arthritis    Depression    Dyspnea    WITH EXERTION-SEEING DR [name]   HTN (hypertension)    Hyperlipemia    Past Surgical History:  Past Surgical History:  Procedure Laterality Date   APPENDECTOMY  1982   EYE SURGERY Bilateral    CATARACTS   OOPHORECTOMY Right    VIDEO BRONCHOSCOPY WITH ENDOBRONCHIAL NAVIGATION Left 12/23/2020   Procedure: VIDEO BRONCHOSCOPY WITH ENDOBRONCHIAL NAVIGATION;  Surgeon: Tyler Pita, MD;  Location: [facility] ORS;  Service: Pulmonary;  Laterality: Left;   VIDEO BRONCHOSCOPY WITH ENDOBRONCHIAL NAVIGATION Left 02/01/2021   Procedure: ROBOTIC ASSISTED VIDEO BRONCHOSCOPY WITH ENDOBRONCHIAL NAVIGATION;  Surgeon: Tyler Pita, MD;  Location: [facility] ORS;  Service: Pulmonary;  Laterality: Left;   VIDEO BRONCHOSCOPY WITH ENDOBRONCHIAL ULTRASOUND Left 12/23/2020   Procedure: VIDEO BRONCHOSCOPY WITH ENDOBRONCHIAL ULTRASOUND;  Surgeon: Tyler Pita, MD;  Location:  [facility] ORS;  Service: Pulmonary;  Laterality: Left;   VIDEO BRONCHOSCOPY WITH ENDOBRONCHIAL ULTRASOUND Left 02/01/2021   Procedure: ROBOTIC ASSITSTED VIDEO BRONCHOSCOPY WITH ENDOBRONCHIAL ULTRASOUND;  Surgeon: Tyler Pita, MD;  Location: [facility] ORS;  Service: Pulmonary;  Laterality: Left;   Social History:  Social History   Socioeconomic History   Marital status: Single    Spouse name: Not on file   Number of children: 3   Years of education: high school    Highest education level: Not on file  Occupational History   Occupation: retired  Tobacco Use   Smoking status: Former    Packs/day: 0.50    Years: 20.00    Pack years: 10.00    Types: Cigarettes    Quit date: 10/27/1989    Years since quitting: 31.3   Smokeless tobacco: Never   Tobacco comments:    Smoked off and on in 20 year period  Vaping Use   Vaping Use: Never used  Substance and Sexual Activity   Alcohol use: Yes    Comment: 3-4 times a week, 2oz liquor    Drug use: Never   Sexual activity: Not Currently    Birth control/protection: Post-menopausal  Other Topics Concern   Not on file  Social History Narrative   She has discussed advanced directives with her.  She would want CPR and intubation if chance of recovery.   11/19/20   From: [place], moved for work 1998   Living: alone   Work: retired Energy manager of [employer]: 3 children - [name], [name], [name] - 5 grandchildren, 1 great-grandchild      Enjoys: shop, casino, cruise travel      Exercise: not as much due to the fatigue   Diet: tries to eat healthy, but eats what she wants      Safety   Seat belts: Yes    Guns: No   Safe in relationships: Yes    Social Determinants of Radio broadcast assistant Strain: Not on file  Food Insecurity: Not on file  Transportation Needs: Not on file  Physical Activity: Not on file  Stress: Not on file  Social Connections: Not on file  Intimate Partner Violence: Not on file   Family History:   Family History  Problem Relation Age of Onset   Addison's disease Daughter    Hypertension Mother        died at 69   Alcohol abuse Father     Review of Systems: Constitutional: Doesn't report fevers, chills or abnormal weight loss Eyes: Doesn't report blurriness of vision Ears, nose, mouth, throat, and face: Doesn't report sore throat Respiratory: Doesn't report cough, dyspnea or wheezes Cardiovascular: Doesn't report palpitation, chest discomfort  Gastrointestinal:  Doesn't report nausea, constipation, diarrhea GU: Doesn't report incontinence Skin: Doesn't report skin rashes Neurological: Per HPI Musculoskeletal: Doesn't report joint pain Behavioral/Psych: Doesn't report anxiety  Physical Exam: Vitals:   03/09/21 1210  BP: 122/79  Pulse: 63  Resp: 18  Temp: 97.8 F (36.6 C)  SpO2: 97%   KPS: 80. General: Alert,  cooperative, pleasant, in no acute distress Head: Normal EENT: No conjunctival injection or scleral icterus.  Lungs: Resp effort normal Cardiac: Regular rate Abdomen: Non-distended abdomen Skin: No rashes cyanosis or petechiae. Extremities: No clubbing or edema  Neurologic Exam: Mental Status: Awake, alert, attentive to examiner. Oriented to self and environment. Mild expressive aphasia.  Cranial Nerves: Visual acuity is grossly normal. Visual fields are full. Extra-ocular movements intact. No ptosis. Face is symmetric Motor: Tone and bulk are normal. Power is full in both arms and legs. Reflexes are symmetric, no pathologic reflexes present.  Sensory: Intact to light touch Gait: Normal.   Labs: I have reviewed the data as listed    Component Value Date/Time   NA 138 03/04/2021 1328   K 4.1 03/04/2021 1328   CL 107 03/04/2021 1328   CO2 23 03/04/2021 1320   GLUCOSE 150 (H) 03/04/2021 1328   BUN 33 (H) 03/04/2021 1328   CREATININE 1.40 (H) 03/04/2021 1328   CALCIUM 9.6 03/04/2021 1320   PROT 7.5 03/04/2021 1320   ALBUMIN 4.1 03/04/2021 1320    AST 23 03/04/2021 1320   ALT 15 03/04/2021 1320   ALKPHOS 52 03/04/2021 1320   BILITOT 1.1 03/04/2021 1320   GFRNONAA 38 (L) 03/04/2021 1320   Lab Results  Component Value Date   WBC 5.5 03/04/2021   NEUTROABS 3.0 03/04/2021   HGB 13.9 03/04/2021   HCT 41.0 03/04/2021   MCV 99.0 03/04/2021   PLT 188 03/04/2021    Imaging:  MR BRAIN W WO CONTRAST  Result Date: 03/04/2021 CLINICAL DATA:  Neuro deficit, acute, stroke suspected; brain mass or lesion. EXAM: MRI HEAD WITHOUT AND WITH CONTRAST TECHNIQUE: Multiplanar, multiecho pulse sequences of the brain and surrounding structures were obtained without and with intravenous contrast. CONTRAST:  40mL GADAVIST GADOBUTROL 1 MMOL/ML IV SOLN COMPARISON:  Noncontrast head CT performed earlier today 03/04/2021. FINDINGS: Brain: Mild intermittent motion degradation. Mild generalized cerebral and cerebellar atrophy. 1.5 x 1.4 x 1.2 cm enhancing mass along the mid left frontal lobe convexity (series 10, image 39) (series 11, image 16). The lesion abuts the overlying dura. Centrally, the mass demonstrates relative hypoenhancement, which may reflect necrosis. There is moderate surrounding vasogenic edema. Regional mass effect. Trace 1-2 mm rightward midline shift. No second enhancing intracranial lesion is identified. No cortical encephalomalacia is identified. No significant white matter disease for age. There is no acute infarct. No extra-axial fluid collection. No midline shift. Vascular: Expected proximal arterial flow voids. Skull and upper cervical spine: No focal marrow lesion. Sinuses/Orbits: Visualized orbits show no acute finding. Bilateral lens replacements. Trace bilateral ethmoid sinus mucosal thickening. Small fluid level and mild mucosal thickening within the right sphenoid sinus. IMPRESSION: Mildly motion degraded exam. 1.5 x 1.4 cm enhancing lesion along the mid left frontal lobe convexity, as described and likely reflecting a metastasis given the  patient's history of lung malignancy. Moderate surrounding vasogenic edema. Regional mass effect. Trace 1-2 mm rightward midline shift. No second enhancing intracranial lesion is identified. Mild generalized parenchymal atrophy. Paranasal sinus disease as described. Electronically Signed   By: Kellie Simmering DO   On: 03/04/2021 16:51   CT HEAD CODE STROKE WO CONTRAST  Result Date: 03/04/2021 CLINICAL DATA:  Code stroke. Stroke/TIA, assess intracranial arteries. Additional history obtained from neurology physician: Patient with history of lung cancer, aphasia. EXAM: CT HEAD WITHOUT CONTRAST TECHNIQUE: Contiguous axial images were obtained from the base of the skull through the vertex without intravenous contrast. COMPARISON:  No pertinent prior  exams available for comparison. FINDINGS: Brain: Cerebral volume is normal for age. There is a region of vasogenic edema within the mid to posterior left frontal lobe, measuring 5.2 x 4.3 x 5.6 cm (AP x TV x CC). No loss of gray-white differentiation is appreciated. Local mass effect regional cerebral sulcal effacement. No significant ventricular effacement or midline shift. Findings are highly suspicious for an underlying mass/metastasis at this site given the known history of lung cancer. There is no acute intracranial hemorrhage. No extra-axial fluid collection. Vascular: No hyperdense vessel. Skull: Normal. Negative for fracture or focal suspicious osseous lesion. Sinuses/Orbits: Visualized orbits show no acute finding. Small fluid level within the right sphenoid sinus. Background mild right sphenoid sinus mucosal thickening. Trace bilateral ethmoid sinus mucosal thickening. These results were called by telephone at the time of interpretation on 03/04/2021 at 1:35 pm to provider Dr. Lorrin Goodell, who verbally acknowledged these results. IMPRESSION: 5.2 x 4.3 x 5.6 cm region of vasogenic edema within the mid to posterior left frontal lobe. Findings are highly suspicious for  an underlying mass/metastasis at this site given the known history of lung cancer. A contrast-enhanced brain MRI is recommended for further evaluation. Regional mass effect without significant ventricular effacement or midline shift. Paranasal sinus disease, as described. Electronically Signed   By: Kellie Simmering DO   On: 03/04/2021 13:41      Assessment/Plan Brain metastasis ([place])  Focal seizure ([place])  Marie Hensley presents with clinical and radiographic syndrome consistent with focal seizure, from left frontal metastasis secondary to suspected lung primary neoplasm.    We discussed treatment planning for the brain mass in light of absent histology.  Tumor characteristics and location are highly suggestive of metastasis.  Case was discussed in brain/spine tumor board; recommendation was made for pre-operative radiosurgery followed by craniotomy, excision.  Additional benefit aside from local control would be tissue for formal diagnosis and tumor genetics.  This would preclude the need for a third thoracic biopsy.  Patient was agreeable with this plan, understands risks and benefits.  We will place referral for neurosurgery and radiation oncology.  She should continue Keppra 500mg  BID for now, will hold off on decadron until post-operative period due to minimal symptoms.  We spent twenty additional minutes teaching regarding the natural history, biology, and historical experience in the treatment of neurologic complications of cancer.   We appreciate the opportunity to participate in the care of Marie Hensley.  She will follow up with [name] in 3 months following surgery, or sooner if needed.  All questions were answered. The patient knows to call the clinic with any problems, questions or concerns. No barriers to learning were detected.  The total time spent in the encounter was 45 minutes and more than 50% was on counseling and review of test results   Ventura Sellers, MD Medical  Director of Neuro-Oncology [facility] at [place] 03/09/21 1:21 PM

## 2021-03-10 ENCOUNTER — Other Ambulatory Visit: Payer: Self-pay | Admitting: Neurological Surgery

## 2021-03-10 ENCOUNTER — Other Ambulatory Visit: Payer: Self-pay | Admitting: Radiation Therapy

## 2021-03-10 DIAGNOSIS — C7949 Secondary malignant neoplasm of other parts of nervous system: Secondary | ICD-10-CM

## 2021-03-10 DIAGNOSIS — C7931 Secondary malignant neoplasm of brain: Secondary | ICD-10-CM

## 2021-03-10 MED ORDER — PROCHLORPERAZINE MALEATE 10 MG PO TABS
ORAL_TABLET | ORAL | Status: AC
  Filled 2021-03-10: qty 1

## 2021-03-16 ENCOUNTER — Encounter: Payer: Self-pay | Admitting: Radiation Therapy

## 2021-03-16 NOTE — Progress Notes (Signed)
Sent out a new appointment schedule for Marie Hensley on 03/16/21.  --------------------------------------------------------  Hello Marie Hensley, I hope this finds you well. This is Mont Dutton from Dr. Johny Shears office at [facility] in [place]. We had talked about plans for your radiation treatment planning and surgical resection with Dr. Zada Finders. I have made the desired changes to your schedule to allow you time with your family during their visit the week of 7/16. Please review the appointments below and give me a call to discuss them. Please let me know if your family will be helping with transportation to these visits or if other arrangements need to be made. I look forward to hearing back from you. [phone].   Mont Dutton R.T.(R)(T) [facility]  Radiation Oncology  Radiation Special Procedures Navigator Office: [phone]           Brain MRI will be done at [place] on [facility] [place] [address], arrive at 8:30 for check in and IV start.  Simulation and radiation are at the [facility].  Surgery is scheduled for 7/28 at [facility] on [address] will get more information about this during your visit with Dr. Zada Finders at his office. (Consult scheduled 7/8 @11 :30)

## 2021-03-17 ENCOUNTER — Other Ambulatory Visit: Payer: Medicare HMO

## 2021-03-18 ENCOUNTER — Ambulatory Visit: Payer: Medicare HMO | Admitting: Radiation Oncology

## 2021-03-18 ENCOUNTER — Ambulatory Visit: Payer: Medicare HMO

## 2021-03-19 DIAGNOSIS — D496 Neoplasm of unspecified behavior of brain: Secondary | ICD-10-CM | POA: Diagnosis not present

## 2021-03-24 ENCOUNTER — Ambulatory Visit: Payer: Medicare HMO | Admitting: Radiation Oncology

## 2021-03-25 ENCOUNTER — Ambulatory Visit: Payer: Medicare HMO | Admitting: Radiation Oncology

## 2021-03-26 ENCOUNTER — Other Ambulatory Visit: Payer: Medicare HMO

## 2021-03-29 ENCOUNTER — Ambulatory Visit: Payer: Medicare HMO | Admitting: Radiation Oncology

## 2021-03-30 ENCOUNTER — Ambulatory Visit
Admission: RE | Admit: 2021-03-30 | Discharge: 2021-03-30 | Disposition: A | Discharge status: Home / Self Care | Payer: Medicare HMO | Source: Ambulatory Visit | Attending: Radiation Oncology | Admitting: Radiation Oncology

## 2021-03-30 ENCOUNTER — Ambulatory Visit: Payer: Medicare HMO

## 2021-03-30 ENCOUNTER — Ambulatory Visit: Admission: RE | Admit: 2021-03-30 | Payer: Medicare HMO | Source: Ambulatory Visit | Admitting: Radiation Oncology

## 2021-03-30 ENCOUNTER — Other Ambulatory Visit: Payer: Self-pay

## 2021-03-30 ENCOUNTER — Encounter: Payer: Self-pay | Admitting: Radiation Oncology

## 2021-03-30 VITALS — BP 114/81 | HR 74 | Temp 96.8°F | Resp 18 | Ht 64.0 in | Wt 162.5 lb

## 2021-03-30 VITALS — BP 114/81 | HR 74 | Temp 96.8°F | Resp 18

## 2021-03-30 DIAGNOSIS — C3411 Malignant neoplasm of upper lobe, right bronchus or lung: Secondary | ICD-10-CM | POA: Diagnosis not present

## 2021-03-30 DIAGNOSIS — Z79899 Other long term (current) drug therapy: Secondary | ICD-10-CM | POA: Diagnosis not present

## 2021-03-30 DIAGNOSIS — Z510 Encounter for antineoplastic radiation therapy: Secondary | ICD-10-CM | POA: Insufficient documentation

## 2021-03-30 DIAGNOSIS — C7931 Secondary malignant neoplasm of brain: Secondary | ICD-10-CM | POA: Diagnosis not present

## 2021-03-30 DIAGNOSIS — R69 Illness, unspecified: Secondary | ICD-10-CM | POA: Diagnosis not present

## 2021-03-30 DIAGNOSIS — C801 Malignant (primary) neoplasm, unspecified: Secondary | ICD-10-CM | POA: Insufficient documentation

## 2021-03-30 DIAGNOSIS — Z87891 Personal history of nicotine dependence: Secondary | ICD-10-CM | POA: Diagnosis not present

## 2021-03-30 DIAGNOSIS — E785 Hyperlipidemia, unspecified: Secondary | ICD-10-CM | POA: Diagnosis not present

## 2021-03-30 DIAGNOSIS — I10 Essential (primary) hypertension: Secondary | ICD-10-CM | POA: Insufficient documentation

## 2021-03-30 DIAGNOSIS — R918 Other nonspecific abnormal finding of lung field: Secondary | ICD-10-CM | POA: Insufficient documentation

## 2021-03-30 DIAGNOSIS — F418 Other specified anxiety disorders: Secondary | ICD-10-CM | POA: Diagnosis not present

## 2021-03-30 NOTE — Progress Notes (Signed)
  Radiation Oncology         [phone] ________________________________  Name: Marie Hensley MRN: [id number]  Date: 03/30/2021  DOB: 10/02/1945  SIMULATION AND TREATMENT PLANNING NOTE    ICD-10-CM   1. Brain metastasis ([place])  C79.31       DIAGNOSIS:  75 yo woman with a 1.5 cm presumed brain metastasis in the left frontal lobe, from right upper lung cancer, pathology pending.  NARRATIVE:  The patient was brought to the [facility].  Identity was confirmed.  All relevant records and images related to the planned course of therapy were reviewed.  The patient freely provided informed written consent to proceed with treatment after reviewing the details related to the planned course of therapy. The consent form was witnessed and verified by the simulation staff. Intravenous access was established for contrast administration. Then, the patient was set-up in a stable reproducible supine position for radiation therapy.  A relocatable thermoplastic stereotactic head frame was fabricated for precise immobilization.  CT images were obtained.  Surface markings were placed.  The CT images were loaded into the planning software and fused with the patient's targeting MRI scan.  Then the target and avoidance structures were contoured.  Treatment planning then occurred.  The radiation prescription was entered and confirmed.  I have requested 3D planning  I have requested a DVH of the following structures: Brain stem, brain, left eye, right eye, lenses, optic chiasm, target volumes, uninvolved brain, and normal tissue.    SPECIAL TREATMENT PROCEDURE:  The planned course of therapy using radiation constitutes a special treatment procedure. Special care is required in the management of this patient for the following reasons. This treatment constitutes a Special Treatment Procedure for the following reason: High dose per fraction requiring special monitoring for increased toxicities of treatment  including daily imaging.  The special nature of the planned course of radiotherapy will require increased physician supervision and oversight to ensure patient's safety with optimal treatment outcomes.  PLAN:  The patient will receive 20 Gy in 1 fraction pre-op to be followed by resection.  ________________________________  Sheral Apley Tammi Klippel, M.D.

## 2021-03-30 NOTE — Addendum Note (Signed)
Encounter addended by: Sherrlyn Hock, RN on: 03/30/2021 1:30 PM  Actions taken: Vitals modified, Clinical Note Signed

## 2021-03-30 NOTE — Progress Notes (Signed)
Radiation Oncology         [phone] ________________________________  Initial outpatient Consultation  Name: Marie Hensley MRN: [id number]  Date of Service: 03/30/2021 DOB: 1946-05-15  VV:OHYW, Jobe Marker, MD  Mickeal Skinner Acey Lav, MD   REFERRING PHYSICIAN: Ventura Sellers, MD  DIAGNOSIS: 75 y/o female with a 1.5 cm mass in the left frontal lobe, likely a metastasis from primary lung cancer but tissue confirmation pending.    ICD-10-CM   1. Brain metastasis ([place])  C79.31       HISTORY OF PRESENT ILLNESS: Marie Hensley is a 75 y.o. female seen at the request of Dr. Mickeal Skinner. She presented to the ED on 03/04/21 with sudden onset aphasia. Non-contrast head CT showed a 5.6 cm region of vasogenic edema within the mid to posterior left frontal lobe. Further evaluation with brain MRI showed a 1.5 cm enhancing lesion along the mid left frontal lobe convexity with moderate surrounding vasogenic edema, regional mass effect, and trace 1-2 mm rightward midline shift. She was started on Keppra $RemoveB'500mg'PgVBbcMz$  po BID in the ED.   Her case was discussed in our multidisciplinary brain tumor board, and she met with Dr. Mickeal Skinner on 03/09/21. The consensus recommendation is for preoperative SRS followed by excisional craniotomy. She developed some difficulty with right upper extremity coordination and strength on Frisay 03/26/21 and was started on dexamethasone on 03/26/21 by Dr. Zada Finders. She reports much improvement in these symptoms since starting the steroids. She is scheduled for a treatment planning 3T brain MRI on 04/01/21 prior to her stereotactic radiosurgery on 04/06/21 to be followed by craniotomy on 04/08/21 under the care of Dr. Zada Finders.  Her primary cancer has not yet been formally diagnosed. In summary, she presented to her PCP, Dr. Waunita Schooner in 11/2020 with shortness of breath on exertion. Work up showed a concerning LUL nodule. She was referred to cardiology and subsequently underwent PET scan on  12/22/20 showing hypermetabolism to the LUL nodule, as well as three hypermetabolic left hilar lymph nodes. Also noted was focal activity in the colon. She proceeded to bronchoscopy eiyh Dr. [name] on 12/23/20. Unfortunately, the pathology was nondiagnostic. She underwent repeat bronchoscopy on 02/01/21, and all pathology and cytology was benign.  PREVIOUS RADIATION THERAPY: No  PAST MEDICAL HISTORY:  Past Medical History:  Diagnosis Date   Arthritis    Depression    Dyspnea    WITH EXERTION-SEEING DR [name]   HTN (hypertension)    Hyperlipemia       PAST SURGICAL HISTORY: Past Surgical History:  Procedure Laterality Date   APPENDECTOMY  1982   EYE SURGERY Bilateral    CATARACTS   OOPHORECTOMY Right    VIDEO BRONCHOSCOPY WITH ENDOBRONCHIAL NAVIGATION Left 12/23/2020   Procedure: VIDEO BRONCHOSCOPY WITH ENDOBRONCHIAL NAVIGATION;  Surgeon: Tyler Pita, MD;  Location: [facility];  Service: Pulmonary;  Laterality: Left;   VIDEO BRONCHOSCOPY WITH ENDOBRONCHIAL NAVIGATION Left 02/01/2021   Procedure: ROBOTIC ASSISTED VIDEO BRONCHOSCOPY WITH ENDOBRONCHIAL NAVIGATION;  Surgeon: Tyler Pita, MD;  Location: [facility];  Service: Pulmonary;  Laterality: Left;   VIDEO BRONCHOSCOPY WITH ENDOBRONCHIAL ULTRASOUND Left 12/23/2020   Procedure: VIDEO BRONCHOSCOPY WITH ENDOBRONCHIAL ULTRASOUND;  Surgeon: Tyler Pita, MD;  Location: [facility];  Service: Pulmonary;  Laterality: Left;   VIDEO BRONCHOSCOPY WITH ENDOBRONCHIAL ULTRASOUND Left 02/01/2021   Procedure: ROBOTIC ASSITSTED VIDEO BRONCHOSCOPY WITH ENDOBRONCHIAL ULTRASOUND;  Surgeon: Tyler Pita, MD;  Location: [facility];  Service: Pulmonary;  Laterality: Left;    FAMILY HISTORY:  Family  History  Problem Relation Age of Onset   Addison's disease Daughter    Hypertension Mother        died at 97   Alcohol abuse Father     SOCIAL HISTORY:  Social History   Socioeconomic History   Marital status: Single    Spouse name:  Not on file   Number of children: 3   Years of education: high school    Highest education level: Not on file  Occupational History   Occupation: retired  Tobacco Use   Smoking status: Former    Packs/day: 0.50    Years: 20.00    Pack years: 10.00    Types: Cigarettes    Quit date: 10/27/1989    Years since quitting: 31.4   Smokeless tobacco: Never   Tobacco comments:    Smoked off and on in 20 year period  Vaping Use   Vaping Use: Never used  Substance and Sexual Activity   Alcohol use: Yes    Comment: 3-4 times a week, 2oz liquor    Drug use: Never   Sexual activity: Not Currently    Birth control/protection: Post-menopausal  Other Topics Concern   Not on file  Social History Narrative   She has discussed advanced directives with her.  She would want CPR and intubation if chance of recovery.   11/19/20   From: [place], moved for work 1998   Living: alone   Work: retired Energy manager of [employer]: 3 children - [name], [name], [name] - 5 grandchildren, 1 great-grandchild      Enjoys: shop, casino, cruise travel      Exercise: not as much due to the fatigue   Diet: tries to eat healthy, but eats what she wants      Safety   Seat belts: Yes    Guns: No   Safe in relationships: Yes    Social Determinants of Radio broadcast assistant Strain: Not on file  Food Insecurity: Not on file  Transportation Needs: Not on file  Physical Activity: Not on file  Stress: Not on file  Social Connections: Not on file  Intimate Partner Violence: Not on file    ALLERGIES: Patient has no known allergies.  MEDICATIONS:  Current Outpatient Medications  Medication Sig Dispense Refill   acetaminophen (TYLENOL) 500 MG tablet Take 1,000 mg by mouth every 6 (six) hours as needed for mild pain (or arthritis).     atorvastatin (LIPITOR) 40 MG tablet Take 1 tablet (40 mg total) by mouth at bedtime. 90 tablet 3   cetirizine (ZYRTEC) 10 MG tablet Take 10 mg by mouth daily as  needed for allergies or rhinitis.     citalopram (CELEXA) 20 MG tablet Take 1 tablet (20 mg total) by mouth at bedtime. 90 tablet 1   fluticasone (FLONASE) 50 MCG/ACT nasal spray Place 1 spray into both nostrils daily as needed (for seasonal allergies).     levETIRAcetam (KEPPRA) 500 MG tablet Take 1 tablet (500 mg total) by mouth 2 (two) times daily. 60 tablet 0   lisinopril-hydrochlorothiazide (ZESTORETIC) 10-12.5 MG tablet Take 1 tablet by mouth daily. (Patient taking differently: Take 1 tablet by mouth at bedtime.) 90 tablet 3   loperamide (IMODIUM A-D) 2 MG tablet Take 2 mg by mouth 2 (two) times daily as needed for diarrhea or loose stools.     methylPREDNISolone (MEDROL DOSEPAK) 4 MG TBPK tablet Take by mouth.  propranolol ER (INDERAL LA) 60 MG 24 hr capsule Take 1 capsule (60 mg total) by mouth at bedtime. (Patient taking differently: Take 60 mg by mouth in the morning.) 90 capsule 3   No current facility-administered medications for this encounter.    REVIEW OF SYSTEMS:  On review of systems, the patient reports that she is doing well overall. She denies any chest pain, shortness of breath, cough, fevers, chills, night sweats, unintended weight changes. She denies any bowel or bladder disturbances, and denies abdominal pain, nausea or vomiting. She denies any new musculoskeletal or joint aches or pains. She reports some dizziness, difficulty with right hand coordination, and some improved but persistent difficulty with word finding. She denies seizures, headaches, nausea, vomiting, focal weakness/numbness, and visual changes. A complete review of systems is obtained and is otherwise negative.    PHYSICAL EXAM:  Wt Readings from Last 3 Encounters:  03/30/21 162 lb 8 oz (73.7 kg)  03/09/21 164 lb 11.2 oz (74.7 kg)  03/03/21 167 lb 8 oz (76 kg)   Temp Readings from Last 3 Encounters:  03/30/21 (!) 96.8 F (36 C) (Temporal)  03/09/21 97.8 F (36.6 C) (Tympanic)  03/04/21 97.8 F  (36.6 C) (Oral)   BP Readings from Last 3 Encounters:  03/30/21 114/81  03/09/21 122/79  03/04/21 126/72   Pulse Readings from Last 3 Encounters:  03/30/21 74  03/09/21 63  03/04/21 63   Pain Assessment Pain Score: 0-No pain/10  In general this is a well appearing Caucasian female in no acute distress.  She is alert and oriented x4 and appropriate throughout the examination. HEENT reveals that the patient is normocephalic, atraumatic. EOMs are intact. PERRLA. There is some mild expressive aphasia. Skin is intact without any evidence of gross lesions. Cardiopulmonary assessment is negative for acute distress and she exhibits normal effort. The abdomen is soft, non tender, non distended. Lower extremities are negative for pretibial pitting edema, deep calf tenderness, cyanosis or clubbing. Strength is 4/5 in the RUE and 5/5 in the LUE. Strength is 5/5 and equal bilaterally in the LEs and sensation is intact.   KPS = 90  100 - Normal; no complaints; no evidence of disease. 90   - Able to carry on normal activity; minor signs or symptoms of disease. 80   - Normal activity with effort; some signs or symptoms of disease. 34   - Cares for self; unable to carry on normal activity or to do active work. 60   - Requires occasional assistance, but is able to care for most of his personal needs. 50   - Requires considerable assistance and frequent medical care. 65   - Disabled; requires special care and assistance. 25   - Severely disabled; hospital admission is indicated although death not imminent. 61   - Very sick; hospital admission necessary; active supportive treatment necessary. 10   - Moribund; fatal processes progressing rapidly. 0     - Dead  Karnofsky DA, Abelmann [place], Craver LS and Burchenal [facility] [phone]) The use of the nitrogen mustards in the palliative treatment of carcinoma: with particular reference to bronchogenic carcinoma Cancer 1 634-56  LABORATORY DATA:  Lab Results   Component Value Date   WBC 5.5 03/04/2021   HGB 13.9 03/04/2021   HCT 41.0 03/04/2021   MCV 99.0 03/04/2021   PLT 188 03/04/2021   Lab Results  Component Value Date   NA 138 03/04/2021   K 4.1 03/04/2021   CL 107 03/04/2021  CO2 23 03/04/2021   Lab Results  Component Value Date   ALT 15 03/04/2021   AST 23 03/04/2021   ALKPHOS 52 03/04/2021   BILITOT 1.1 03/04/2021     RADIOGRAPHY: MR BRAIN W WO CONTRAST  Result Date: 03/04/2021 CLINICAL DATA:  Neuro deficit, acute, stroke suspected; brain mass or lesion. EXAM: MRI HEAD WITHOUT AND WITH CONTRAST TECHNIQUE: Multiplanar, multiecho pulse sequences of the brain and surrounding structures were obtained without and with intravenous contrast. CONTRAST:  63mL GADAVIST GADOBUTROL 1 MMOL/ML IV SOLN COMPARISON:  Noncontrast head CT performed earlier today 03/04/2021. FINDINGS: Brain: Mild intermittent motion degradation. Mild generalized cerebral and cerebellar atrophy. 1.5 x 1.4 x 1.2 cm enhancing mass along the mid left frontal lobe convexity (series 10, image 39) (series 11, image 16). The lesion abuts the overlying dura. Centrally, the mass demonstrates relative hypoenhancement, which may reflect necrosis. There is moderate surrounding vasogenic edema. Regional mass effect. Trace 1-2 mm rightward midline shift. No second enhancing intracranial lesion is identified. No cortical encephalomalacia is identified. No significant white matter disease for age. There is no acute infarct. No extra-axial fluid collection. No midline shift. Vascular: Expected proximal arterial flow voids. Skull and upper cervical spine: No focal marrow lesion. Sinuses/Orbits: Visualized orbits show no acute finding. Bilateral lens replacements. Trace bilateral ethmoid sinus mucosal thickening. Small fluid level and mild mucosal thickening within the right sphenoid sinus. IMPRESSION: Mildly motion degraded exam. 1.5 x 1.4 cm enhancing lesion along the mid left frontal lobe  convexity, as described and likely reflecting a metastasis given the patient's history of lung malignancy. Moderate surrounding vasogenic edema. Regional mass effect. Trace 1-2 mm rightward midline shift. No second enhancing intracranial lesion is identified. Mild generalized parenchymal atrophy. Paranasal sinus disease as described. Electronically Signed   By: Kellie Simmering DO   On: 03/04/2021 16:51   CT HEAD CODE STROKE WO CONTRAST  Result Date: 03/04/2021 CLINICAL DATA:  Code stroke. Stroke/TIA, assess intracranial arteries. Additional history obtained from neurology physician: Patient with history of lung cancer, aphasia. EXAM: CT HEAD WITHOUT CONTRAST TECHNIQUE: Contiguous axial images were obtained from the base of the skull through the vertex without intravenous contrast. COMPARISON:  No pertinent prior exams available for comparison. FINDINGS: Brain: Cerebral volume is normal for age. There is a region of vasogenic edema within the mid to posterior left frontal lobe, measuring 5.2 x 4.3 x 5.6 cm (AP x TV x CC). No loss of gray-white differentiation is appreciated. Local mass effect regional cerebral sulcal effacement. No significant ventricular effacement or midline shift. Findings are highly suspicious for an underlying mass/metastasis at this site given the known history of lung cancer. There is no acute intracranial hemorrhage. No extra-axial fluid collection. Vascular: No hyperdense vessel. Skull: Normal. Negative for fracture or focal suspicious osseous lesion. Sinuses/Orbits: Visualized orbits show no acute finding. Small fluid level within the right sphenoid sinus. Background mild right sphenoid sinus mucosal thickening. Trace bilateral ethmoid sinus mucosal thickening. These results were called by telephone at the time of interpretation on 03/04/2021 at 1:35 pm to provider Dr. Lorrin Goodell, who verbally acknowledged these results. IMPRESSION: 5.2 x 4.3 x 5.6 cm region of vasogenic edema within the  mid to posterior left frontal lobe. Findings are highly suspicious for an underlying mass/metastasis at this site given the known history of lung cancer. A contrast-enhanced brain MRI is recommended for further evaluation. Regional mass effect without significant ventricular effacement or midline shift. Paranasal sinus disease, as described. Electronically Signed   By:  Kellie Simmering DO   On: 03/04/2021 13:41      IMPRESSION/PLAN: 90. 75 y.o. female with a 1.5 cm mass in the left frontal lobe, likely a metastasis from primary lung cancer but tissue confirmation pending. Today, we talked to the patient about the findings and workup thus far. We discussed the natural history of metastatic carcinoma and general treatment, highlighting the role of radiotherapy in the management. At this point, the patient would potentially benefit from radiotherapy. The options include whole brain irradiation versus stereotactic radiosurgery. There are pros and cons associated with each of these potential treatment options. Whole brain radiotherapy would treat the known metastatic deposits and help provide some reduction of risk for future brain metastases. However, whole brain radiotherapy carries potential risks including hair loss, subacute somnolence, and neurocognitive changes including a possible reduction in short-term memory. Whole brain radiotherapy also may carry a lower likelihood of tumor control at the treatment sites because of the low-dose used. Stereotactic radiosurgery carries a higher likelihood for local tumor control at the targeted sites with lower associated risk for neurocognitive changes such as memory loss. However, the use of stereotactic radiosurgery in this setting may leave the patient at increased risk for new brain metastases elsewhere in the brain as high as 50-60%. Accordingly, patients who receive stereotactic radiosurgery in this setting should undergo ongoing surveillance imaging with brain MRI more  frequently in order to identify and treat new small brain metastases before they become symptomatic. Stereotactic radiosurgery does carry some different risks, including a risk of radionecrosis. We discussed the logistics and delivery of each of the treatment options and reviewed the results associated with each of the treatments described above as well as the pros and cons of each.  We reviewed the consensus recommendation from multidisciplinary brain tumor board to proceed with a single fraction of preoperative SRS prior to craniotomy.  The patient was encouraged to ask questions that were answered to her satisfaction.  She seems to understand the treatment options and would like to proceed with preoperative stereotactic radiosurgery followed by surgical resection of the brain lesion with the intent for good local control as well as to obtain tissue for a formal diagnosis in hopes of precluding the need for a third bronchoscopy procedure.  She has freely signed written consent to proceed today in the office and a copy of this document will be placed in her medical record.  She will proceed with CT simulation/treatment planning following our visit today and we will share our discussion with Dr. Mickeal Skinner, Dr. Venetia Constable and Dr. [name] and proceed with treatment planning accordingly.  She is currently scheduled for a treatment planning 3T MRI brain on 04/01/2021 prior to her single fraction of SRS which will be coordinated with Dr. Venetia Constable and is tentatively scheduled for 04/06/2021 and will be followed by craniotomy with Dr. Venetia Constable on 04/08/2021.  We personally spent 75 minutes in this encounter including chart review, reviewing radiological studies, meeting face-to-face with the patient, entering orders and completing documentation.    Nicholos Johns, PA-C    Tyler Pita, MD  [facility] Direct Dial: [phone]  Fax: [phone] [email or web address]  Skype   [email or web address]   This document serves as a record of services personally performed by Tyler Pita, MD and Freeman Caldron, PA-C. It was created on their behalf by Wilburn Mylar, a trained medical scribe. The creation of this record is based on the scribe's personal observations and the provider's statements to them. This  document has been checked and approved by the attending provider.

## 2021-03-30 NOTE — Progress Notes (Addendum)
Has armband been applied?  Yes.    Does patient have an allergy to IV contrast dye?: No.   Has patient ever received premedication for IV contrast dye?: No.   Does patient take metformin?: No.  If patient does take metformin when was the last dose: na  Date of lab work: 03/04/2021 03/26/2021 BUN: 33 CR: 1.40 eGFR: 38  IV site: antecubital right, condition patent and no redness  Has IV site been added to flowsheet?  Yes.    Vitals:   03/30/21 1330  BP: 114/81  Pulse: 74  Resp: 18  Temp: (!) 96.8 F (36 C)  TempSrc: Temporal  SpO2: 98%     80% reduce dose contrast

## 2021-03-30 NOTE — Progress Notes (Signed)
Location/Histology of Brain Tumor:   Patient presented with symptoms of:    Past or anticipated interventions, if any, per neurosurgery:   Past or anticipated interventions, if any, per medical oncology:   Dose of Decadron, if applicable: No  Recent neurologic symptoms, if any:  Seizures: No Headaches: no Nausea: no Dizziness/ataxia: yes some does not have medication Difficulty with hand coordination: yes Focal numbness/weakness: no Visual deficits/changes: no Confusion/Memory deficits: yes searching for right word to use  Painful bone metastases at present, if any:   SAFETY ISSUES: Prior radiation? no Pacemaker/ICD? no Possible current pregnancy? no Is the patient on methotrexate? no  Additional Complaints / other details:  Vitals:   03/30/21 0831  BP: 114/81  Pulse: 74  Resp: 18  Temp: (!) 96.8 F (36 C)  TempSrc: Temporal  SpO2: 98%  Weight: 73.7 kg  Height: 5\' 4"  (1.626 m)

## 2021-03-30 NOTE — Progress Notes (Signed)
Has armband been applied?  Yes.    Does patient have an allergy to IV contrast dye?: No.   Has patient ever received premedication for IV contrast dye?: No.   Does patient take metformin?: No.  If patient does take metformin when was the last dose:   Date of lab work: 03/04/2021 03/26/2021 BUN: 33 CR: 1.40 eGFR: 38  IV site: antecubital right, condition patent and no redness  Has IV site been added to flowsheet?  Yes.      80% reduce dose contrast

## 2021-03-31 ENCOUNTER — Institutional Professional Consult (permissible substitution): Payer: Medicare HMO | Admitting: Radiation Oncology

## 2021-04-01 ENCOUNTER — Ambulatory Visit
Admission: RE | Admit: 2021-04-01 | Discharge: 2021-04-01 | Disposition: A | Discharge status: Home / Self Care | Payer: Medicare HMO | Source: Ambulatory Visit | Attending: Radiation Oncology | Admitting: Radiation Oncology

## 2021-04-01 DIAGNOSIS — Z85118 Personal history of other malignant neoplasm of bronchus and lung: Secondary | ICD-10-CM | POA: Diagnosis not present

## 2021-04-01 DIAGNOSIS — C7931 Secondary malignant neoplasm of brain: Secondary | ICD-10-CM

## 2021-04-01 DIAGNOSIS — G936 Cerebral edema: Secondary | ICD-10-CM | POA: Diagnosis not present

## 2021-04-01 IMAGING — MR MR HEAD WO/W CM
13 series · 48 of 48 positions shown · IV contrast (multihance)
Comparison: MRI head fifty [DATE] through

CLINICAL DATA: Brain mass.  History of lung cancer

EXAM:
MRI HEAD WITHOUT AND WITH CONTRAST
TECHNIQUE: Multiplanar, multiecho pulse sequences of the brain and surrounding
structures were obtained without and with intravenous contrast.
CONTRAST:  15mL MULTIHANCE GADOBENATE DIMEGLUMINE 529 MG/ML IV SOLN

[Series 2: FLAIR · sagittal · 3.0mm · 0.75mm/px · 2 of 42 slices shown (1 of 2)]
[im 1/42]
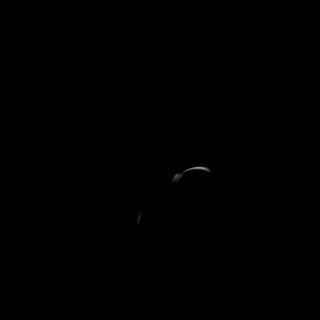
[im 42/42]
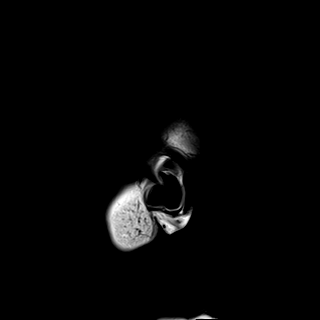

[Series 3: DWI · axial · 3.0mm · 1.50mm/px · z∈[-53,+99]mm · 3 of 82 slices shown (1 of 2)]
[im 1/82]
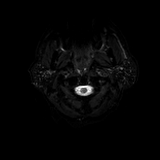
[im 41/82]
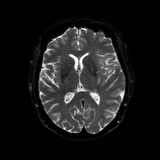
[im 82/82]
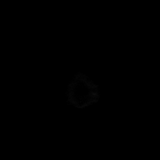

[Series 4: DWI · axial · 3.0mm · 1.50mm/px · z∈[-53,+99]mm · 2 of 41 slices shown (2 of 2)]
[im 1/41]
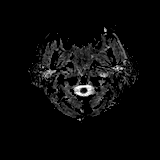
[im 41/41]
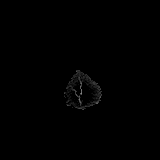

[Series 5: T2 · axial · 5.0mm · 0.57mm/px · 1 of 29 slices shown (1 of 2)]
[im 1/29]
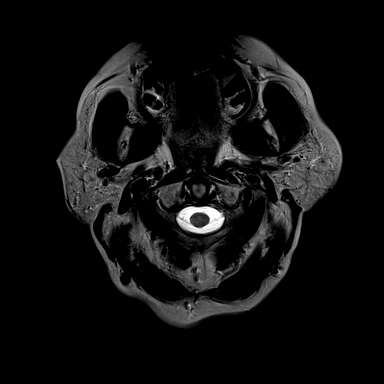

[Series 6: swi_images · axial · 1.5mm · 0.90mm/px · z∈[-53,+97]mm · 4 of 104 slices shown]
[im 1/104]
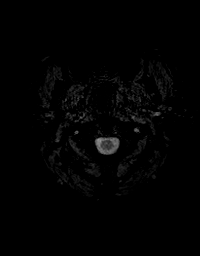
[im 35/104]
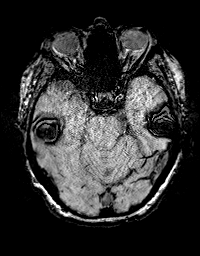
[im 69/104]
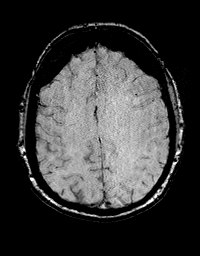
[im 104/104]
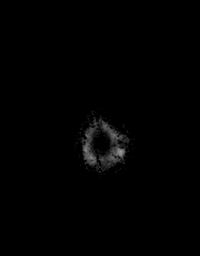

[Series 8: FLAIR · axial · 3.0mm · 0.94mm/px · z∈[-110,+82]mm · 3 of 65 slices shown (2 of 2)]
[im 1/65]
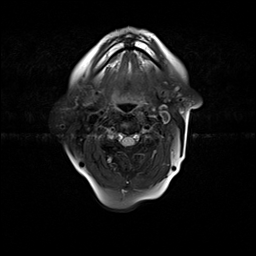
[im 33/65]
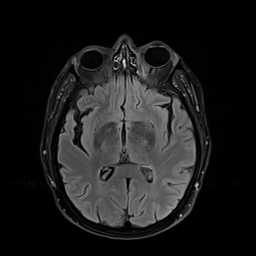
[im 65/65]
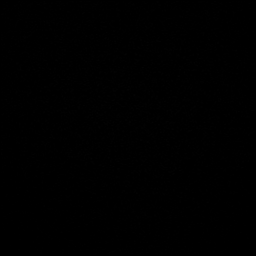

[Series 9: T2 · axial · non-contrast · 1.0mm · 0.94mm/px · z∈[-93,+78]mm · 7 of 176 slices shown (2 of 2)]
[im 1/176]
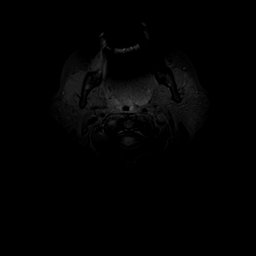
[im 30/176]
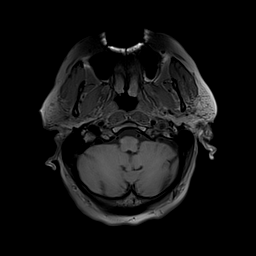
[im 59/176]
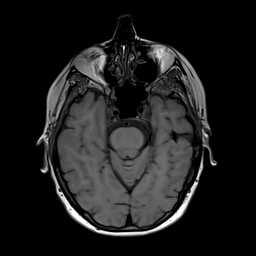
[im 88/176]
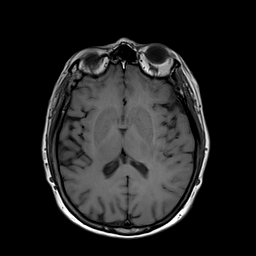
[im 117/176]
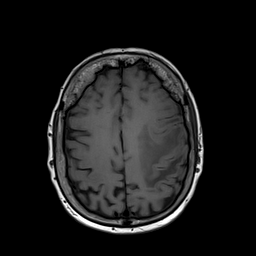
[im 146/176]
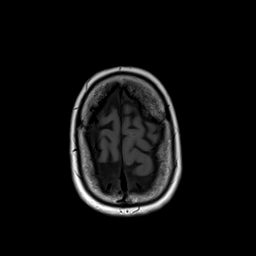
[im 176/176]
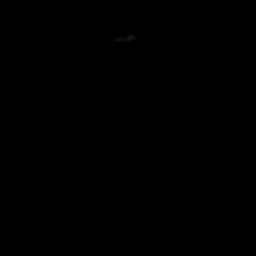

[Series 10: T1 · axial · non-contrast · 1.0mm · 0.75mm/px · z∈[-91,+82]mm · 7 of 172 slices shown]
[im 1/172]
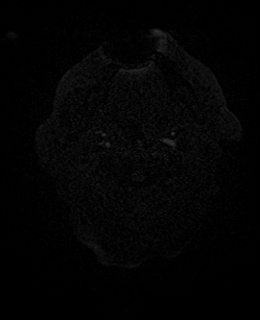
[im 29/172]
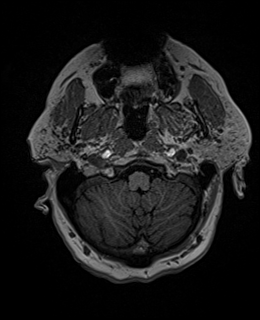
[im 58/172]
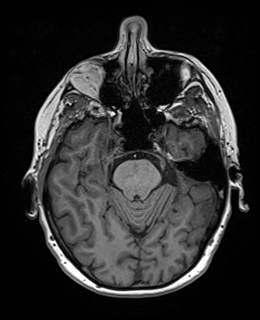
[im 86/172]
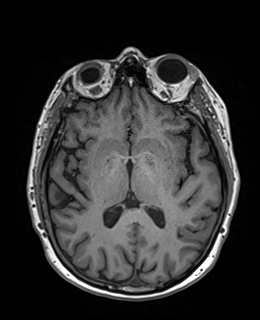
[im 115/172]
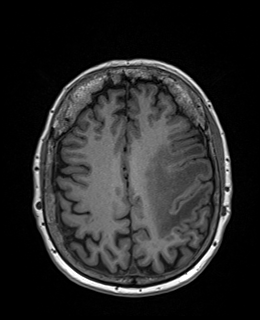
[im 143/172]
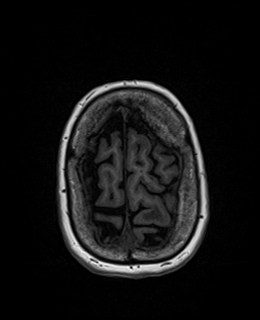
[im 172/172]
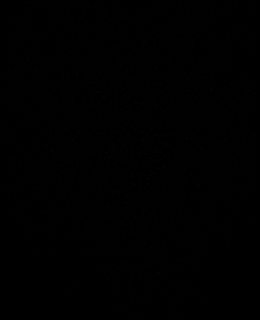

[Series 11: T2 post-contrast · coronal · 3.0mm · 0.57mm/px · 2 of 49 slices shown (1 of 2)]
[im 1/49]
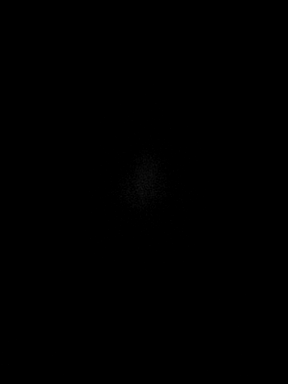
[im 49/49]
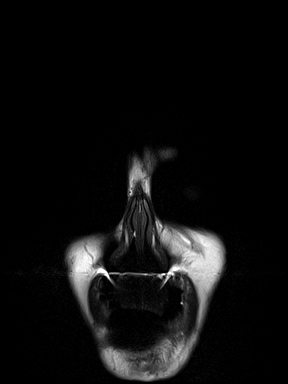

[Series 12: T2 post-contrast · axial · 1.0mm · 0.94mm/px · z∈[-93,+78]mm · 7 of 176 slices shown (2 of 2)]
[im 1/176]
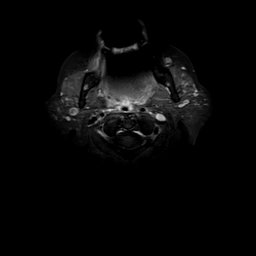
[im 30/176]
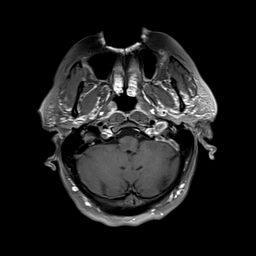
[im 59/176]
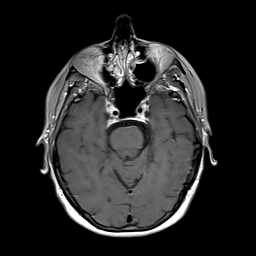
[im 88/176]
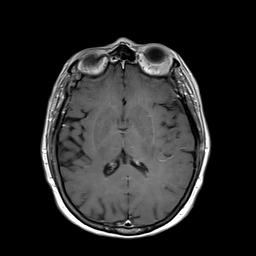
[im 117/176]
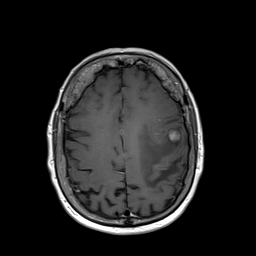
[im 146/176]
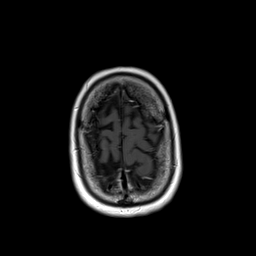
[im 176/176]
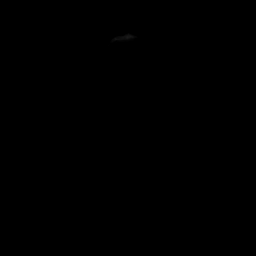

[Series 13: T1 post-contrast · axial · 1.0mm · 0.75mm/px · z∈[-94,+81]mm · 7 of 173 slices shown (1 of 2)]
[im 1/173]
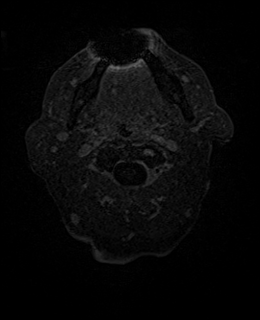
[im 29/173]
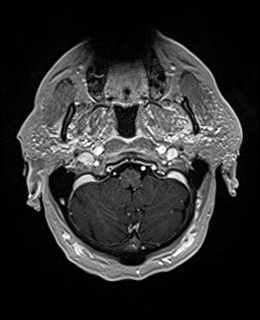
[im 58/173]
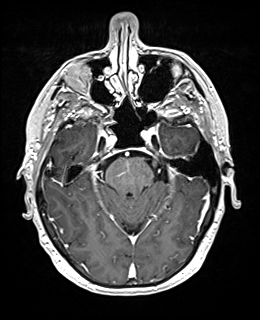
[im 87/173]
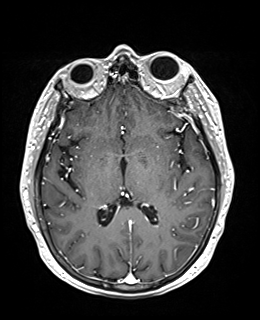
[im 115/173]
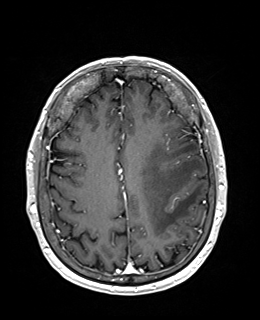
[im 144/173]
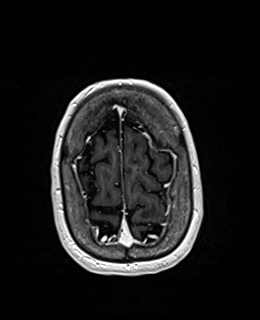
[im 173/173]
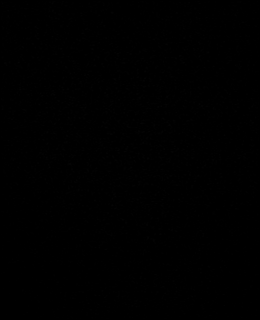

[Series 14: T1 post-contrast · coronal · 3.0mm · 0.57mm/px · 2 of 49 slices shown (2 of 2)]
[im 1/49]
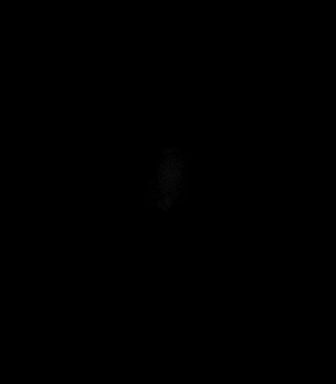
[im 49/49]
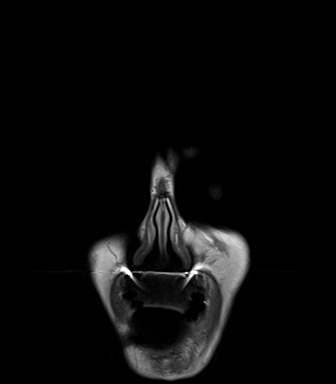

[Series 15: FLAIR post-contrast · sagittal · 3.0mm · 0.75mm/px · 1 of 21 slices shown]
[im 1/21]
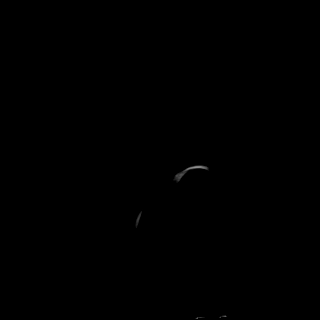

[48 of 48 positions shown; findings below may reference images not displayed]

FINDINGS: Brain: Enhancing mass lesion left middle frontal cortex laterally
and superiorly shows interval progression. The enhancing component
measures 21 x 15 mm on axial images, compared with 18 x 13 mm
previously. Increased vasogenic edema in the white matter. No
associated hemorrhage. No second lesion identified.

Ventricle size normal. Mild midline shift to the right has
progressed measuring approximately 3 mm.

Negative for acute infarct.  Negative for hemorrhage

Vascular: Normal arterial flow voids.

Skull and upper cervical spine: No skeletal metastasis identified.

Sinuses/Orbits: Negative

Other: None
IMPRESSION: Solitary metastasis left middle frontal lobe. There is been interval
growth since the prior MRI of [DATE]. Progressive vasogenic
edema and mild midline shift compared to the prior study. No second
lesion identified.

## 2021-04-01 MED ORDER — GADOBENATE DIMEGLUMINE 529 MG/ML IV SOLN
15.0000 mL | Freq: Once | INTRAVENOUS | Status: AC | PRN
  Administered 2021-04-01: 15 mL via INTRAVENOUS

## 2021-04-02 ENCOUNTER — Other Ambulatory Visit: Payer: Self-pay | Admitting: Radiation Therapy

## 2021-04-05 DIAGNOSIS — C7931 Secondary malignant neoplasm of brain: Secondary | ICD-10-CM | POA: Diagnosis not present

## 2021-04-05 DIAGNOSIS — C3411 Malignant neoplasm of upper lobe, right bronchus or lung: Secondary | ICD-10-CM | POA: Diagnosis not present

## 2021-04-05 DIAGNOSIS — Z87891 Personal history of nicotine dependence: Secondary | ICD-10-CM | POA: Diagnosis not present

## 2021-04-05 DIAGNOSIS — R4701 Aphasia: Secondary | ICD-10-CM | POA: Diagnosis not present

## 2021-04-05 NOTE — Progress Notes (Signed)
Surgical Instructions    Your procedure is scheduled on Thursday, July 28th, 2022 .  Report to [facility] Main Entrance "A" at 05:30 A.M., then check in with the Admitting office.  Call this number if you have problems the morning of surgery:  [phone]   If you have any questions prior to your surgery date call [phone]: Open Monday-Friday 8am-4pm    Remember:  Do not eat after midnight the night before your surgery  You may drink clear liquids until 04:30 the morning of your surgery.   Clear liquids allowed are: Water, Non-Citrus Juices (without pulp), Carbonated Beverages, Clear Tea, Black Coffee Only, and Gatorade    Take these medicines the morning of surgery with A SIP OF WATER:  levETIRAcetam (KEPPRA)  propranolol ER (INDERAL LA)  acetaminophen (TYLENOL) - if needed cetirizine (ZYRTEC) - if needed fluticasone (FLONASE) - if needed loperamide (IMODIUM A-D) - if needed  As of today, STOP taking any Aspirin (unless otherwise instructed by your surgeon) Aleve, Naproxen, Ibuprofen, Motrin, Advil, Goody's, BC's, all herbal medications, fish oil, and all vitamins.          Do not wear jewelry or makeup Do not wear lotions, powders, perfumes, or deodorant. Do not shave 48 hours prior to surgery.   Do not bring valuables to the hospital. DO Not wear nail polish, gel polish, artificial nails, or any other type of covering on natural nails including finger and toenails. If patients have artificial nails, gel coating, etc. that need to be removed by a nail salon please have this removed prior to surgery or surgery may need to be canceled/delayed if the surgeon/ anesthesia feels like the patient is unable to be adequately monitored.             [facility] is not responsible for any belongings or valuables.  Do NOT Smoke (Tobacco/Vaping) or drink Alcohol 24 hours prior to your procedure If you use a CPAP at night, you may bring all equipment for your overnight stay.    Contacts, glasses, dentures or bridgework may not be worn into surgery, please bring cases for these belongings   For patients admitted to the hospital, discharge time will be determined by your treatment team.   Patients discharged the day of surgery will not be allowed to drive home, and someone needs to stay with them for 24 hours.  ONLY 1 SUPPORT PERSON MAY BE PRESENT WHILE YOU ARE IN SURGERY. IF YOU ARE TO BE ADMITTED ONCE YOU ARE IN YOUR ROOM YOU WILL BE ALLOWED TWO (2) VISITORS.  Minor children may have two parents present. Special consideration for safety and communication needs will be reviewed on a case by case basis.  Special instructions:    Oral Hygiene is also important to reduce your risk of infection.  Remember - BRUSH YOUR TEETH THE MORNING OF SURGERY WITH YOUR REGULAR TOOTHPASTE   [facility]- Preparing For Surgery  Before surgery, you can play an important role. Because skin is not sterile, your skin needs to be as free of germs as possible. You can reduce the number of germs on your skin by washing with CHG (chlorahexidine gluconate) Soap before surgery.  CHG is an antiseptic cleaner which kills germs and bonds with the skin to continue killing germs even after washing.     Please do not use if you have an allergy to CHG or antibacterial soaps. If your skin becomes reddened/irritated stop using the CHG.  Do not shave (including legs and  underarms) for at least 48 hours prior to first CHG shower. It is OK to shave your face.  Please follow these instructions carefully.     Shower the NIGHT BEFORE SURGERY and the MORNING OF SURGERY with CHG Soap.   If you chose to wash your hair, wash your hair first as usual with your normal shampoo. After you shampoo, rinse your hair and body thoroughly to remove the shampoo.  Then [employer] and genitals (private parts) with your normal soap and rinse thoroughly to remove soap.  After that Use CHG Soap as you would any other liquid  soap. You can apply CHG directly to the skin and wash gently with a scrungie or a clean washcloth.   Apply the CHG Soap to your body ONLY FROM THE NECK DOWN.  Do not use on open wounds or open sores. Avoid contact with your eyes, ears, mouth and genitals (private parts). Wash Face and genitals (private parts)  with your normal soap.   Wash thoroughly, paying special attention to the area where your surgery will be performed.  Thoroughly rinse your body with warm water from the neck down.  DO NOT shower/wash with your normal soap after using and rinsing off the CHG Soap.  Pat yourself dry with a CLEAN TOWEL.  Wear CLEAN PAJAMAS to bed the night before surgery  Place CLEAN SHEETS on your bed the night before your surgery  DO NOT SLEEP WITH PETS.   Day of Surgery:  Take a shower with CHG soap. Wear Clean/Comfortable clothing the morning of surgery Do not apply any deodorants/lotions.   Remember to brush your teeth WITH YOUR REGULAR TOOTHPASTE.   Please read over the following fact sheets that you were given.

## 2021-04-06 ENCOUNTER — Encounter (HOSPITAL_COMMUNITY): Payer: Self-pay

## 2021-04-06 ENCOUNTER — Ambulatory Visit: Payer: Medicare HMO

## 2021-04-06 ENCOUNTER — Encounter (HOSPITAL_COMMUNITY)
Admission: RE | Admit: 2021-04-06 | Discharge: 2021-04-06 | Disposition: A | Discharge status: Home / Self Care | Payer: Medicare HMO | Source: Ambulatory Visit | Attending: Neurological Surgery | Admitting: Neurological Surgery

## 2021-04-06 ENCOUNTER — Other Ambulatory Visit: Payer: Self-pay

## 2021-04-06 ENCOUNTER — Ambulatory Visit
Admission: RE | Admit: 2021-04-06 | Discharge: 2021-04-06 | Disposition: A | Discharge status: Home / Self Care | Payer: Medicare HMO | Source: Ambulatory Visit | Attending: Radiation Oncology | Admitting: Radiation Oncology

## 2021-04-06 ENCOUNTER — Encounter: Payer: Self-pay | Admitting: Radiation Oncology

## 2021-04-06 ENCOUNTER — Other Ambulatory Visit (HOSPITAL_COMMUNITY): Payer: Medicare HMO

## 2021-04-06 VITALS — BP 120/75 | HR 55 | Temp 96.7°F | Resp 18

## 2021-04-06 DIAGNOSIS — Z01812 Encounter for preprocedural laboratory examination: Secondary | ICD-10-CM | POA: Insufficient documentation

## 2021-04-06 DIAGNOSIS — C7931 Secondary malignant neoplasm of brain: Secondary | ICD-10-CM

## 2021-04-06 DIAGNOSIS — Z20822 Contact with and (suspected) exposure to covid-19: Secondary | ICD-10-CM | POA: Insufficient documentation

## 2021-04-06 DIAGNOSIS — C3411 Malignant neoplasm of upper lobe, right bronchus or lung: Secondary | ICD-10-CM | POA: Diagnosis not present

## 2021-04-06 DIAGNOSIS — Z87891 Personal history of nicotine dependence: Secondary | ICD-10-CM | POA: Diagnosis not present

## 2021-04-06 LAB — CBC
HCT: 38.1 % (ref 36.0–46.0)
Hemoglobin: 12.8 g/dL (ref 12.0–15.0)
MCH: 33 pg (ref 26.0–34.0)
MCHC: 33.6 g/dL (ref 30.0–36.0)
MCV: 98.2 fL (ref 80.0–100.0)
Platelets: 189 10*3/uL (ref 150–400)
RBC: 3.88 MIL/uL (ref 3.87–5.11)
RDW: 13.2 % (ref 11.5–15.5)
WBC: 7.4 10*3/uL (ref 4.0–10.5)
nRBC: 0 % (ref 0.0–0.2)

## 2021-04-06 LAB — BASIC METABOLIC PANEL
Anion gap: 7 (ref 5–15)
BUN: 33 mg/dL — ABNORMAL HIGH (ref 8–23)
CO2: 25 mmol/L (ref 22–32)
Calcium: 9.2 mg/dL (ref 8.9–10.3)
Chloride: 101 mmol/L (ref 98–111)
Creatinine, Ser: 1.46 mg/dL — ABNORMAL HIGH (ref 0.44–1.00)
GFR, Estimated: 37 mL/min — ABNORMAL LOW (ref >60)
Glucose, Bld: 127 mg/dL — ABNORMAL HIGH (ref 70–99)
Potassium: 4.1 mmol/L (ref 3.5–5.1)
Sodium: 133 mmol/L — ABNORMAL LOW (ref 135–145)

## 2021-04-06 LAB — SARS CORONAVIRUS 2 (TAT 6-24 HRS): SARS Coronavirus 2: NEGATIVE

## 2021-04-06 NOTE — Op Note (Signed)
  Name: Marie Hensley  MRN: [id number]  Date: 04/06/2021   DOB: 10-22-1945  Stereotactic Radiosurgery Operative Note  PRE-OPERATIVE DIAGNOSIS:  Brain Metastasis  POST-OPERATIVE DIAGNOSIS:  Same  PROCEDURE:  Stereotactic Radiosurgery  SURGEON:  Judith Part, MD  NARRATIVE: The patient underwent a radiation treatment planning session in the radiation oncology simulation suite under the care of the radiation oncology physician and physicist.  I participated closely in the radiation treatment planning afterwards. The patient underwent planning CT which was fused to 3T high resolution MRI with 1 mm axial slices.  These images were fused on the planning system.  We contoured the gross target volumes and subsequently expanded this to yield the Planning Target Volume. I actively participated in the planning process.  I helped to define and review the target contours and also the contours of the optic pathway, eyes, brainstem and selected nearby organs at risk.  All the dose constraints for critical structures were reviewed and compared to AAPM Task Group 101.  The prescription dose conformity was reviewed.  I approved the plan electronically.    Accordingly, [name] was brought to the TrueBeam stereotactic radiation treatment linac and placed in the custom immobilization mask.  The patient was aligned according to the IR fiducial markers with BrainLab Exactrac, then orthogonal x-rays were used in ExacTrac with the 6DOF robotic table and the shifts were made to align the patient  [name] received stereotactic radiosurgery uneventfully.    Lesions treated:  1   Complex lesions treated:  0 (>3.5 cm, <62mm of optic path, or within the brainstem)   The detailed description of the procedure is recorded in the radiation oncology procedure note.  I was present for the duration of the procedure.  DISPOSITION:  Following delivery, the patient was transported to nursing in stable  condition and monitored for possible acute effects to be discharged to home in stable condition with follow-up in one month.  Judith Part, MD 04/06/2021 4:20 PM

## 2021-04-06 NOTE — Progress Notes (Signed)
Marie Hensley rested with [name] for 30 minutes following her Kulm treatment.  Patient denies headache, dizziness, nausea, diplopia or ringing in the ears. Denies fatigue. Patient without complaints. Understands to avoid strenuous activity for the next 24 hours and call [phone] with needs.   BP 120/75   Pulse (!) 55   Temp (!) 96.7 F (35.9 C)   Resp 18   SpO2 100%    [name] Level M. Leonie Green, BSN

## 2021-04-06 NOTE — Progress Notes (Signed)
PCP - Waunita Schooner, MD Cardiologist - denies Oncologist - Cecil Cobbs, MD  PPM/ICD - denies Device Orders - N/A Rep Notified - N/A  Chest x-ray - 02/01/2021 EKG - 03/04/2021 Stress Test -  ECHO - 11/24/2020 Cardiac Cath -   Sleep Study - denies CPAP - N/A  Fasting Blood Sugar - N/A  Blood Thinner Instructions: N/A  Aspirin Instructions: Patient was instructed: As of today, STOP taking any Aspirin (unless otherwise instructed by your surgeon) Aleve, Naproxen, Ibuprofen, Motrin, Advil, Goody's, BC's, all herbal medications, fish oil, and all vitamins.  ERAS Protcol - No   COVID TEST- 04/06/2021   Anesthesia review: yes;   Patient denies shortness of breath, fever, cough and chest pain at PAT appointment   All instructions explained to the patient, with a verbal understanding of the material. Patient agrees to go over the instructions while at home for a better understanding. Patient also instructed to self quarantine after being tested for COVID-19. The opportunity to ask questions was provided.

## 2021-04-07 ENCOUNTER — Inpatient Hospital Stay (HOSPITAL_COMMUNITY)
Admission: EM | Admit: 2021-04-07 | Discharge: 2021-04-11 | DRG: 026 | Disposition: A | Discharge status: Home Health | Payer: Medicare HMO | Attending: Neurological Surgery | Admitting: Neurological Surgery

## 2021-04-07 ENCOUNTER — Other Ambulatory Visit: Payer: Self-pay

## 2021-04-07 ENCOUNTER — Emergency Department (HOSPITAL_COMMUNITY): Payer: Medicare HMO

## 2021-04-07 ENCOUNTER — Encounter (HOSPITAL_COMMUNITY): Payer: Self-pay

## 2021-04-07 ENCOUNTER — Inpatient Hospital Stay (HOSPITAL_COMMUNITY): Payer: Medicare HMO

## 2021-04-07 DIAGNOSIS — F419 Anxiety disorder, unspecified: Secondary | ICD-10-CM | POA: Diagnosis not present

## 2021-04-07 DIAGNOSIS — T380X5A Adverse effect of glucocorticoids and synthetic analogues, initial encounter: Secondary | ICD-10-CM | POA: Diagnosis not present

## 2021-04-07 DIAGNOSIS — R4701 Aphasia: Secondary | ICD-10-CM | POA: Diagnosis present

## 2021-04-07 DIAGNOSIS — R29810 Facial weakness: Secondary | ICD-10-CM | POA: Diagnosis not present

## 2021-04-07 DIAGNOSIS — D496 Neoplasm of unspecified behavior of brain: Secondary | ICD-10-CM

## 2021-04-07 DIAGNOSIS — C3412 Malignant neoplasm of upper lobe, left bronchus or lung: Secondary | ICD-10-CM | POA: Diagnosis not present

## 2021-04-07 DIAGNOSIS — R4702 Dysphasia: Secondary | ICD-10-CM | POA: Diagnosis not present

## 2021-04-07 DIAGNOSIS — Z87891 Personal history of nicotine dependence: Secondary | ICD-10-CM

## 2021-04-07 DIAGNOSIS — R471 Dysarthria and anarthria: Secondary | ICD-10-CM | POA: Diagnosis present

## 2021-04-07 DIAGNOSIS — N184 Chronic kidney disease, stage 4 (severe): Secondary | ICD-10-CM | POA: Diagnosis not present

## 2021-04-07 DIAGNOSIS — E785 Hyperlipidemia, unspecified: Secondary | ICD-10-CM | POA: Diagnosis present

## 2021-04-07 DIAGNOSIS — C7931 Secondary malignant neoplasm of brain: Secondary | ICD-10-CM | POA: Diagnosis not present

## 2021-04-07 DIAGNOSIS — Y92239 Unspecified place in hospital as the place of occurrence of the external cause: Secondary | ICD-10-CM | POA: Diagnosis not present

## 2021-04-07 DIAGNOSIS — J9811 Atelectasis: Secondary | ICD-10-CM | POA: Diagnosis not present

## 2021-04-07 DIAGNOSIS — G9389 Other specified disorders of brain: Secondary | ICD-10-CM | POA: Diagnosis not present

## 2021-04-07 DIAGNOSIS — R739 Hyperglycemia, unspecified: Secondary | ICD-10-CM | POA: Diagnosis not present

## 2021-04-07 DIAGNOSIS — F32A Depression, unspecified: Secondary | ICD-10-CM | POA: Diagnosis not present

## 2021-04-07 DIAGNOSIS — C801 Malignant (primary) neoplasm, unspecified: Secondary | ICD-10-CM | POA: Diagnosis not present

## 2021-04-07 DIAGNOSIS — R911 Solitary pulmonary nodule: Secondary | ICD-10-CM | POA: Diagnosis not present

## 2021-04-07 DIAGNOSIS — I129 Hypertensive chronic kidney disease with stage 1 through stage 4 chronic kidney disease, or unspecified chronic kidney disease: Secondary | ICD-10-CM | POA: Diagnosis not present

## 2021-04-07 DIAGNOSIS — G936 Cerebral edema: Secondary | ICD-10-CM | POA: Diagnosis not present

## 2021-04-07 DIAGNOSIS — G40909 Epilepsy, unspecified, not intractable, without status epilepticus: Secondary | ICD-10-CM | POA: Diagnosis not present

## 2021-04-07 DIAGNOSIS — Z79899 Other long term (current) drug therapy: Secondary | ICD-10-CM | POA: Diagnosis not present

## 2021-04-07 DIAGNOSIS — R569 Unspecified convulsions: Secondary | ICD-10-CM | POA: Diagnosis not present

## 2021-04-07 DIAGNOSIS — Z01812 Encounter for preprocedural laboratory examination: Secondary | ICD-10-CM

## 2021-04-07 DIAGNOSIS — I10 Essential (primary) hypertension: Secondary | ICD-10-CM | POA: Diagnosis not present

## 2021-04-07 DIAGNOSIS — R69 Illness, unspecified: Secondary | ICD-10-CM | POA: Diagnosis not present

## 2021-04-07 DIAGNOSIS — G319 Degenerative disease of nervous system, unspecified: Secondary | ICD-10-CM | POA: Diagnosis not present

## 2021-04-07 DIAGNOSIS — M1990 Unspecified osteoarthritis, unspecified site: Secondary | ICD-10-CM | POA: Diagnosis present

## 2021-04-07 DIAGNOSIS — N1832 Chronic kidney disease, stage 3b: Secondary | ICD-10-CM | POA: Diagnosis present

## 2021-04-07 DIAGNOSIS — Z20822 Contact with and (suspected) exposure to covid-19: Secondary | ICD-10-CM | POA: Diagnosis present

## 2021-04-07 DIAGNOSIS — Z743 Need for continuous supervision: Secondary | ICD-10-CM | POA: Diagnosis not present

## 2021-04-07 DIAGNOSIS — Z8249 Family history of ischemic heart disease and other diseases of the circulatory system: Secondary | ICD-10-CM

## 2021-04-07 DIAGNOSIS — Z811 Family history of alcohol abuse and dependence: Secondary | ICD-10-CM

## 2021-04-07 DIAGNOSIS — R531 Weakness: Secondary | ICD-10-CM | POA: Diagnosis not present

## 2021-04-07 HISTORY — DX: Neoplasm of unspecified behavior of brain: D49.6

## 2021-04-07 HISTORY — DX: Unspecified convulsions: R56.9

## 2021-04-07 LAB — URINALYSIS, ROUTINE W REFLEX MICROSCOPIC
Bacteria, UA: NONE SEEN
Bilirubin Urine: NEGATIVE
Glucose, UA: NEGATIVE mg/dL
Hgb urine dipstick: NEGATIVE
Ketones, ur: NEGATIVE mg/dL
Nitrite: NEGATIVE
Protein, ur: NEGATIVE mg/dL
Specific Gravity, Urine: 1.02 (ref 1.005–1.030)
pH: 5 (ref 5.0–8.0)

## 2021-04-07 LAB — CBC WITH DIFFERENTIAL/PLATELET
Abs Immature Granulocytes: 0.05 10*3/uL (ref 0.00–0.07)
Basophils Absolute: 0 10*3/uL (ref 0.0–0.1)
Basophils Relative: 0 %
Eosinophils Absolute: 0.1 10*3/uL (ref 0.0–0.5)
Eosinophils Relative: 1 %
HCT: 36 % (ref 36.0–46.0)
Hemoglobin: 11.8 g/dL — ABNORMAL LOW (ref 12.0–15.0)
Immature Granulocytes: 1 %
Lymphocytes Relative: 23 %
Lymphs Abs: 2.2 10*3/uL (ref 0.7–4.0)
MCH: 32.2 pg (ref 26.0–34.0)
MCHC: 32.8 g/dL (ref 30.0–36.0)
MCV: 98.4 fL (ref 80.0–100.0)
Monocytes Absolute: 0.7 10*3/uL (ref 0.1–1.0)
Monocytes Relative: 8 %
Neutro Abs: 6.3 10*3/uL (ref 1.7–7.7)
Neutrophils Relative %: 67 %
Platelets: 184 10*3/uL (ref 150–400)
RBC: 3.66 MIL/uL — ABNORMAL LOW (ref 3.87–5.11)
RDW: 13.2 % (ref 11.5–15.5)
WBC: 9.4 10*3/uL (ref 4.0–10.5)
nRBC: 0 % (ref 0.0–0.2)

## 2021-04-07 LAB — COMPREHENSIVE METABOLIC PANEL
ALT: 22 U/L (ref 0–44)
AST: 28 U/L (ref 15–41)
Albumin: 3.5 g/dL (ref 3.5–5.0)
Alkaline Phosphatase: 44 U/L (ref 38–126)
Anion gap: 7 (ref 5–15)
BUN: 35 mg/dL — ABNORMAL HIGH (ref 8–23)
CO2: 23 mmol/L (ref 22–32)
Calcium: 8.7 mg/dL — ABNORMAL LOW (ref 8.9–10.3)
Chloride: 100 mmol/L (ref 98–111)
Creatinine, Ser: 1.66 mg/dL — ABNORMAL HIGH (ref 0.44–1.00)
GFR, Estimated: 32 mL/min — ABNORMAL LOW (ref >60)
Glucose, Bld: 107 mg/dL — ABNORMAL HIGH (ref 70–99)
Potassium: 4.2 mmol/L (ref 3.5–5.1)
Sodium: 130 mmol/L — ABNORMAL LOW (ref 135–145)
Total Bilirubin: 0.9 mg/dL (ref 0.3–1.2)
Total Protein: 6.3 g/dL — ABNORMAL LOW (ref 6.5–8.1)

## 2021-04-07 IMAGING — DX DG CHEST 1V PORT
1 series · 1 of 1 positions shown · non-contrast
Comparison: Chest radiograph [DATE],, CT [99]

CLINICAL DATA: Aphasia.  Seizures.

EXAM:
PORTABLE CHEST 1 VIEW

[chest]
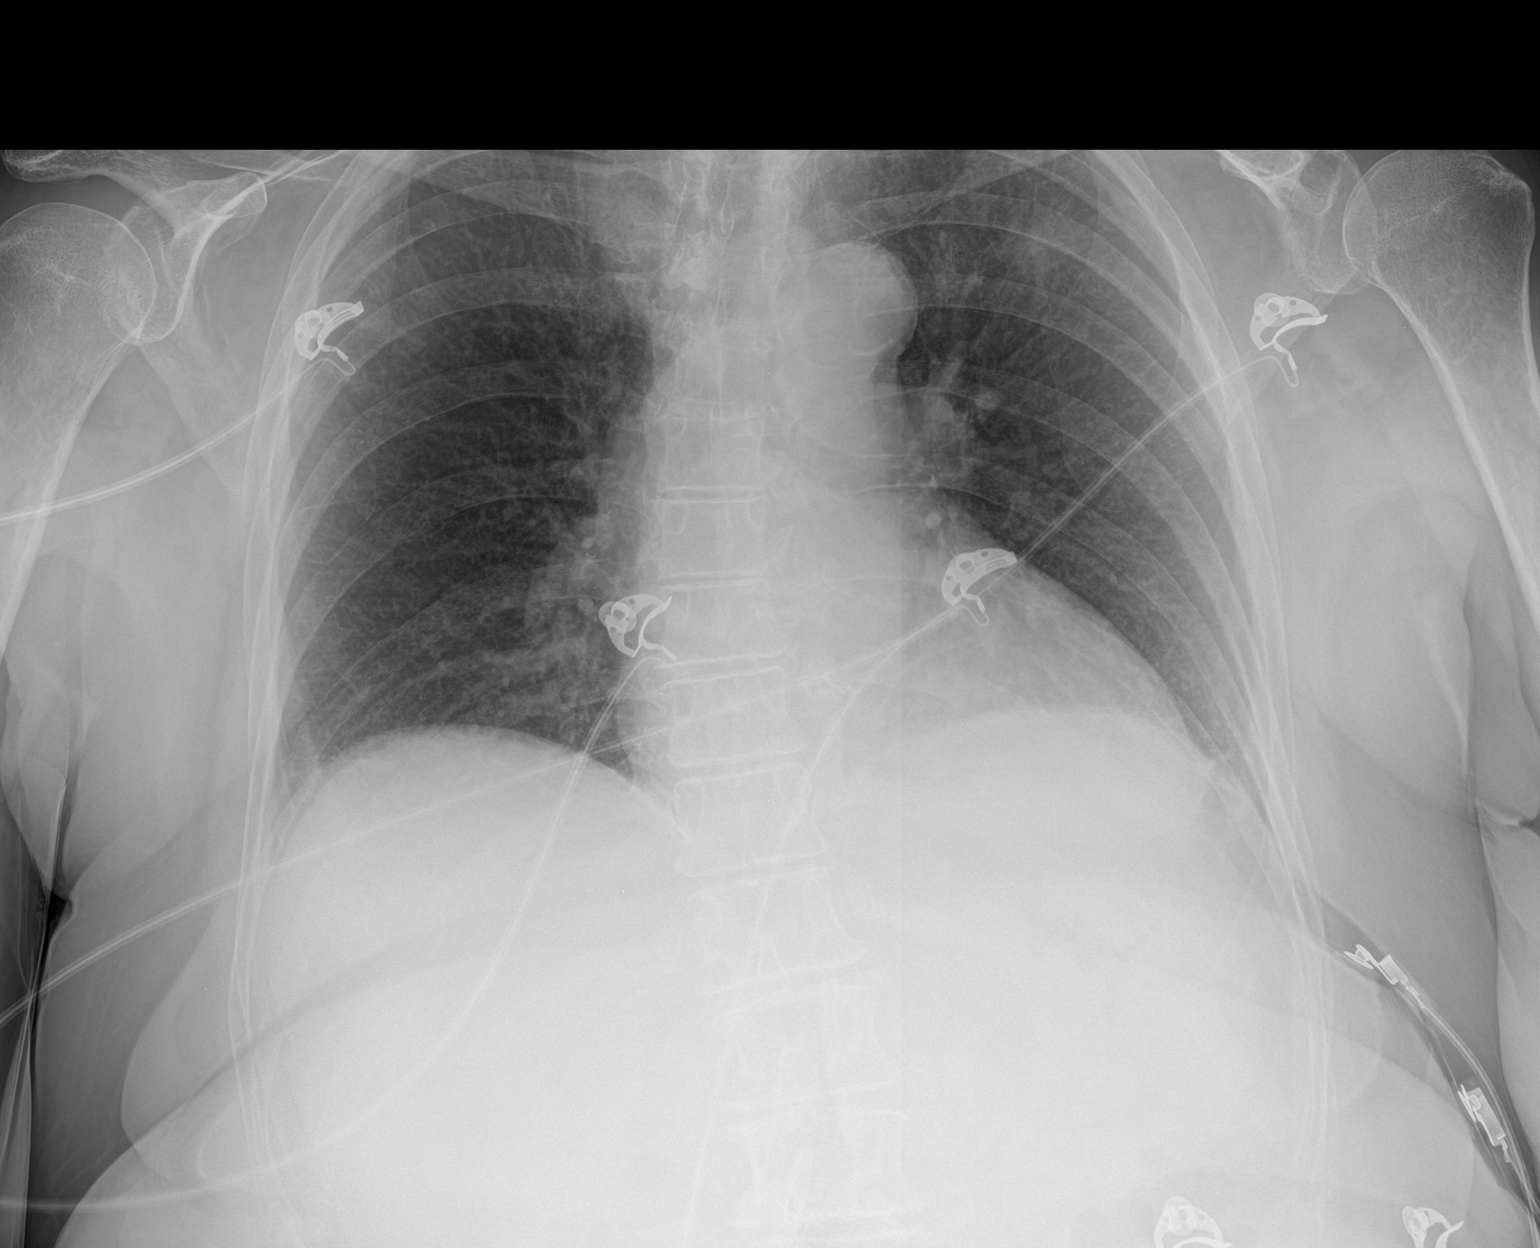

[1 of 1 positions shown; findings below may reference images not displayed]

FINDINGS: Lung volumes are low. Upper normal heart size likely accentuated by
low lung volumes. Aortic atherosclerosis. Known left upper lobe
nodule is faintly visualized. Minimal atelectasis in the left lung
base. No acute airspace disease. No pneumothorax or large pleural
effusion. No acute osseous abnormalities are seen.
IMPRESSION: 1. Low lung volumes with left basilar atelectasis.
2. Known left upper lobe nodule is faintly visualized.

## 2021-04-07 MED ORDER — CITALOPRAM HYDROBROMIDE 10 MG PO TABS
20.0000 mg | ORAL_TABLET | Freq: Every day | ORAL | Status: DC
  Administered 2021-04-08 – 2021-04-10 (×4): 20 mg via ORAL
  Filled 2021-04-07 (×4): qty 2

## 2021-04-07 MED ORDER — DEXAMETHASONE SODIUM PHOSPHATE 10 MG/ML IJ SOLN
10.0000 mg | Freq: Once | INTRAMUSCULAR | Status: AC
  Administered 2021-04-07: 10 mg via INTRAVENOUS
  Filled 2021-04-07: qty 1

## 2021-04-07 MED ORDER — SODIUM CHLORIDE 0.9 % IV BOLUS
1000.0000 mL | Freq: Once | INTRAVENOUS | Status: AC
  Administered 2021-04-07: 1000 mL via INTRAVENOUS

## 2021-04-07 MED ORDER — LEVETIRACETAM 500 MG PO TABS
500.0000 mg | ORAL_TABLET | Freq: Two times a day (BID) | ORAL | Status: DC
  Administered 2021-04-08 – 2021-04-11 (×7): 500 mg via ORAL
  Filled 2021-04-07 (×7): qty 1

## 2021-04-07 MED ORDER — LEVETIRACETAM IN NACL 1000 MG/100ML IV SOLN
1000.0000 mg | Freq: Once | INTRAVENOUS | Status: AC
  Administered 2021-04-07: 1000 mg via INTRAVENOUS
  Filled 2021-04-07: qty 100

## 2021-04-07 MED ORDER — PROPRANOLOL HCL ER 60 MG PO CP24
60.0000 mg | ORAL_CAPSULE | Freq: Every day | ORAL | Status: DC
  Filled 2021-04-07 (×4): qty 1

## 2021-04-07 MED ORDER — ATORVASTATIN CALCIUM 40 MG PO TABS
40.0000 mg | ORAL_TABLET | Freq: Every day | ORAL | Status: DC
  Administered 2021-04-08 – 2021-04-10 (×4): 40 mg via ORAL
  Filled 2021-04-07 (×2): qty 1
  Filled 2021-04-07: qty 4
  Filled 2021-04-07 (×2): qty 1

## 2021-04-07 NOTE — Progress Notes (Signed)
  Radiation Oncology         [phone] ________________________________  Stereotactic Treatment Procedure Note  Name: Marie Hensley MRN: [id number]  Date: 04/06/2021  DOB: [birth year]  SPECIAL TREATMENT PROCEDURE    ICD-10-CM   1. Brain metastasis ([place])  C79.31       3D TREATMENT PLANNING AND DOSIMETRY:  The patient's radiation plan was reviewed and approved by neurosurgery and radiation oncology prior to treatment.  It showed 3-dimensional radiation distributions overlaid onto the planning CT/MRI image set.  The [facility] for the target structures as well as the organs at risk were reviewed. The documentation of the 3D plan and dosimetry are filed in the radiation oncology EMR.  NARRATIVE:  Marie Hensley was brought to the TrueBeam stereotactic radiation treatment machine and placed supine on the CT couch. The head frame was applied, and the patient was set up for stereotactic radiosurgery.  Neurosurgery was present for the set-up and delivery  SIMULATION VERIFICATION:  In the couch zero-angle position, the patient underwent Exactrac imaging using the Brainlab system with orthogonal KV images.  These were carefully aligned and repeated to confirm treatment position for each of the isocenters.  The Exactrac snap film verification was repeated at each couch angle.  PROCEDURE: Marie Hensley received stereotactic radiosurgery to the following targets: Left frontal 20 mm target was treated using 4 Rapid Arc VMAT Beams to a prescription dose of 20 Gy.  ExacTrac registration was performed for each couch angle.  The 100% isodose line was prescribed.  6 MV X-rays were delivered in the flattening filter free beam mode.  STEREOTACTIC TREATMENT MANAGEMENT:  Following delivery, the patient was transported to nursing in stable condition and monitored for possible acute effects.  Vital signs were recorded BP 120/75   Pulse (!) 55   Temp (!) 96.7 F (35.9 C)   Resp 18   SpO2 100% . The  patient tolerated treatment without significant acute effects, and was discharged to home in stable condition.    PLAN: The patient will undergo craniotomy for planned resection tomorrow and then follow-up in one month.  ________________________________  Sheral Apley. Tammi Klippel, M.D.

## 2021-04-07 NOTE — ED Triage Notes (Signed)
Pt lives at home alone. Pt had a seizure while there. Pt has hx of seizures and remembers having it. Pt takes keppra for it. Pt having dysphagia, but she always has dysphagia after having a seizure. Pt also has brain tumor, which she's supposed to have operated on tomorrow.

## 2021-04-07 NOTE — Anesthesia Preprocedure Evaluation (Addendum)
Anesthesia Evaluation  Patient identified by MRN, date of birth, ID band Patient awake    Reviewed: Allergy & Precautions, NPO status , Patient's Chart, lab work & pertinent test results, reviewed documented beta blocker date and time   History of Anesthesia Complications Negative for: history of anesthetic complications  Airway Mallampati: III  TM Distance: <3 FB Neck ROM: Full  Mouth opening: Limited Mouth Opening  Dental  (+) Caps, Dental Advisory Given   Pulmonary shortness of breath, former smoker,  04/06/2021 SARS coronavirus NEG Lung cancer   breath sounds clear to auscultation       Cardiovascular hypertension, Pt. on medications and Pt. on home beta blockers (-) angina Rhythm:Regular Rate:Normal  11/2020 ECHO: EF 60-65%, grade 1 DD, no significant valvular abnormalities   Neuro/Psych Seizures - (in ED last night for seizure), Poorly Controlled,  Depression Brain mets from lung cancer    GI/Hepatic negative GI ROS, Neg liver ROS,   Endo/Other  negative endocrine ROS  Renal/GU Renal InsufficiencyRenal disease     Musculoskeletal  (+) Arthritis ,   Abdominal   Peds  Hematology negative hematology ROS (+)   Anesthesia Other Findings   Reproductive/Obstetrics                            Anesthesia Physical Anesthesia Plan  ASA: 3  Anesthesia Plan: General   Post-op Pain Management:    Induction: Intravenous  PONV Risk Score and Plan: Ondansetron, Dexamethasone and Treatment may vary due to age or medical condition  Airway Management Planned: Oral ETT and Video Laryngoscope Planned  Additional Equipment: Arterial line  Intra-op Plan:   Post-operative Plan: Extubation in OR  Informed Consent: I have reviewed the patients History and Physical, chart, labs and discussed the procedure including the risks, benefits and alternatives for the proposed anesthesia with the patient or  authorized representative who has indicated his/her understanding and acceptance.     Dental advisory given  Plan Discussed with: CRNA and Surgeon  Anesthesia Plan Comments:        Anesthesia Quick Evaluation

## 2021-04-07 NOTE — ED Provider Notes (Signed)
[place] EMERGENCY DEPARTMENT Provider Note   CSN: [id number] Arrival date & time: 04/07/21  1847     History Chief Complaint  Patient presents with   Seizures    Marie Hensley is a 75 y.o. female with a past medical history significant for depression, hypertension, hyperlipidemia, history of seizures on Keppra, CKD, brain METs who presents to the ED due to aphasia that started at [facility]. Per triage note, patient had a seizure around 6PM and became aphasic after which is typical for her. Patient still significantly aphasic at bedside during initial evaluation, so difficult to obtain HPI. She notes she has been taking keppra as prescribed. Has brain surgery scheduled for tomorrow. Patient denies unilateral weakness and visual changes.   Level 5 caveat secondary to aphasia  History obtained from patient and past medical records. No interpreter used during encounter.       Past Medical History:  Diagnosis Date   Arthritis    Brain tumor ([place])    Depression    Dyspnea    WITH EXERTION-SEEING DR [name]   HTN (hypertension)    Hyperlipemia    Seizures ([place])     Patient Active Problem List   Diagnosis Date Noted   Brain metastasis ([place]) 03/09/2021   Focal seizure ([place]) 03/09/2021   Essential tremor 03/03/2021   Atherosclerosis of aorta ([place]) 03/03/2021   Pulmonary nodule 02/23/2021   Dyspnea on exertion 11/19/2020   Chronic kidney disease, stage 4 (severe) ([place]) 04/28/2020   Essential hypertension 10/28/2019   Hyperlipidemia 10/28/2019   Chronic pain of both shoulders 10/28/2019   Depression, major, single episode, complete remission ([place]) 10/28/2019    Past Surgical History:  Procedure Laterality Date   APPENDECTOMY  1982   EYE SURGERY Bilateral    CATARACTS   OOPHORECTOMY Right    VIDEO BRONCHOSCOPY WITH ENDOBRONCHIAL NAVIGATION Left 12/23/2020   Procedure: VIDEO BRONCHOSCOPY WITH ENDOBRONCHIAL NAVIGATION;  Surgeon: Tyler Pita, MD;  Location: [facility] ORS;  Service: Pulmonary;  Laterality: Left;   VIDEO BRONCHOSCOPY WITH ENDOBRONCHIAL NAVIGATION Left 02/01/2021   Procedure: ROBOTIC ASSISTED VIDEO BRONCHOSCOPY WITH ENDOBRONCHIAL NAVIGATION;  Surgeon: Tyler Pita, MD;  Location: [facility] ORS;  Service: Pulmonary;  Laterality: Left;   VIDEO BRONCHOSCOPY WITH ENDOBRONCHIAL ULTRASOUND Left 12/23/2020   Procedure: VIDEO BRONCHOSCOPY WITH ENDOBRONCHIAL ULTRASOUND;  Surgeon: Tyler Pita, MD;  Location: [facility] ORS;  Service: Pulmonary;  Laterality: Left;   VIDEO BRONCHOSCOPY WITH ENDOBRONCHIAL ULTRASOUND Left 02/01/2021   Procedure: ROBOTIC ASSITSTED VIDEO BRONCHOSCOPY WITH ENDOBRONCHIAL ULTRASOUND;  Surgeon: Tyler Pita, MD;  Location: [facility] ORS;  Service: Pulmonary;  Laterality: Left;     OB History   No obstetric history on file.     Family History  Problem Relation Age of Onset   Addison's disease Daughter    Hypertension Mother        died at 80   Alcohol abuse Father     Social History   Tobacco Use   Smoking status: Former    Packs/day: 0.50    Years: 20.00    Pack years: 10.00    Types: Cigarettes    Quit date: 10/27/1989    Years since quitting: 31.4   Smokeless tobacco: Never   Tobacco comments:    Smoked off and on in 20 year period  Vaping Use   Vaping Use: Never used  Substance Use Topics   Alcohol use: Yes    Comment: 3-4 times a week, 2oz liquor  Drug use: Never    Home Medications Prior to Admission medications   Medication Sig Start Date End Date Taking? Authorizing Provider  acetaminophen (TYLENOL) 500 MG tablet Take 1,000 mg by mouth every 6 (six) hours as needed for mild pain (or arthritis).   Yes [provider]  atorvastatin (LIPITOR) 40 MG tablet Take 1 tablet (40 mg total) by mouth at bedtime. 03/03/21  Yes [name], MD  cetirizine (ZYRTEC) 10 MG tablet Take 10 mg by mouth daily as needed for allergies or rhinitis.   Yes [provider]  citalopram (CELEXA) 20 MG tablet Take 1 tablet (20 mg total) by mouth at bedtime. 02/12/21  Yes [name], MD  fluticasone (FLONASE) 50 MCG/ACT nasal spray Place 1 spray into both nostrils daily as needed for allergies.   Yes [provider]  levETIRAcetam (KEPPRA) 500 MG tablet Take 1 tablet (500 mg total) by mouth 2 (two) times daily. 03/04/21 05/22/21 Yes Badalamente, Rudell Cobb, PA-C  lisinopril-hydrochlorothiazide (ZESTORETIC) 10-12.5 MG tablet Take 1 tablet by mouth daily. Patient taking differently: Take 1 tablet by mouth at bedtime. 11/19/20  Yes [name], MD  loperamide (IMODIUM A-D) 2 MG tablet Take 2 mg by mouth 2 (two) times daily as needed for diarrhea or loose stools.   Yes [provider]  propranolol ER (INDERAL LA) 60 MG 24 hr capsule Take 1 capsule (60 mg total) by mouth at bedtime. Patient taking differently: Take 60 mg by mouth in the morning. 03/03/21  Yes [name], MD  methylPREDNISolone (MEDROL DOSEPAK) 4 MG TBPK tablet Take by mouth. Patient not taking: No sig reported 03/25/21   [provider]    Allergies    Patient has no known allergies.  Review of Systems   Review of Systems  Constitutional:  Negative for chills and fever.  Eyes:  Negative for visual disturbance.  Neurological:  Positive for seizures and speech difficulty. Negative for facial asymmetry.  All other systems reviewed and are negative.  Physical Exam Updated Vital Signs BP 102/63 (BP Location: Right Arm)   Pulse 61   Temp 98.5 F (36.9 C) (Oral)   Resp 15   Ht 5\' 4"  (1.626 m)   Wt 72.6 kg   SpO2 97%   BMI 27.46 kg/m   Physical Exam Vitals and nursing note reviewed.  Constitutional:      General: She is not in acute distress.    Appearance: She is not ill-appearing.  HENT:     Head: Normocephalic.  Eyes:     Extraocular Movements: Extraocular movements intact.     Pupils: Pupils are equal, round, and reactive to light.  Cardiovascular:      Rate and Rhythm: Normal rate and regular rhythm.     Pulses: Normal pulses.     Heart sounds: Normal heart sounds. No murmur heard.   No friction rub. No gallop.  Pulmonary:     Effort: Pulmonary effort is normal.     Breath sounds: Normal breath sounds.  Abdominal:     General: Abdomen is flat. There is no distension.     Palpations: Abdomen is soft.     Tenderness: There is no abdominal tenderness. There is no guarding or rebound.  Musculoskeletal:        General: Normal range of motion.     Cervical back: Neck supple.  Skin:    General: Skin is warm and dry.  Neurological:     Mental Status: She  is alert.     Comments: Significant expressive aphasia No facial droop No visual field cuts EOMs intact No drift Able to move all 4 extremities without difficulty  Psychiatric:        Mood and Affect: Mood normal.        Behavior: Behavior normal.    ED Results / Procedures / Treatments   Labs (all labs ordered are listed, but only abnormal results are displayed) Labs Reviewed  CBC WITH DIFFERENTIAL/PLATELET - Abnormal; Notable for the following components:      Result Value   RBC 3.66 (*)    Hemoglobin 11.8 (*)    All other components within normal limits  COMPREHENSIVE METABOLIC PANEL - Abnormal; Notable for the following components:   Sodium 130 (*)    Glucose, Bld 107 (*)    BUN 35 (*)    Creatinine, Ser 1.66 (*)    Calcium 8.7 (*)    Total Protein 6.3 (*)    GFR, Estimated 32 (*)    All other components within normal limits  URINALYSIS, ROUTINE W REFLEX MICROSCOPIC - Abnormal; Notable for the following components:   APPearance HAZY (*)    Leukocytes,Ua MODERATE (*)    All other components within normal limits  RESP PANEL BY RT-PCR (FLU A&B, COVID) [id number]    EKG None  Radiology DG Chest Portable 1 View  Result Date: 04/07/2021 CLINICAL DATA:  Aphasia.  Seizures. EXAM: PORTABLE CHEST 1 VIEW COMPARISON:  Chest radiograph 02/01/2021,, [place] [address] FINDINGS: Lung  volumes are low. Upper normal heart size likely accentuated by low lung volumes. Aortic atherosclerosis. Known left upper lobe nodule is faintly visualized. Minimal atelectasis in the left lung base. No acute airspace disease. No pneumothorax or large pleural effusion. No acute osseous abnormalities are seen. IMPRESSION: 1. Low lung volumes with left basilar atelectasis. 2. Known left upper lobe nodule is faintly visualized. Electronically Signed   By: Keith Rake M.D.   On: 04/07/2021 20:00    Procedures Procedures   Medications Ordered in ED Medications  levETIRAcetam (KEPPRA) IVPB 1000 mg/100 mL premix (0 mg Intravenous Stopped 04/07/21 2242)  dexamethasone (DECADRON) injection 10 mg (10 mg Intravenous Given 04/07/21 2038)  sodium chloride 0.9 % bolus 1,000 mL (1,000 mLs Intravenous New Bag/Given 04/07/21 2252)    ED Course  I have reviewed the triage vital signs and the nursing notes.  Pertinent labs & imaging results that were available during my care of the patient were reviewed by me and considered in my medical decision making (see chart for details).  Clinical Course as of 04/07/21 2338  Wed Apr 07, 2021  2023 Discussed with Dr. Rory Percy who recommends repeat MRI brain w and w/o contrast, keppra 1000mg , decadron 10mg , and increase keppra dose to 750 BID starting tomorrow AM [CA]  2210 Creatinine(!): 1.66 [CA]  2210 BUN(!): 35 [CA]  2318 Chalmers Guest): MODERATE [CA]    Clinical Course User Index [CA] Suzy Bouchard, PA-C   MDM Rules/Calculators/A&P                          75 year old female presents to the ED due to aphasia.  Per triage note, patient noted to have a seizure and became aphasic postictally which is normal for patient.  Patient has a history of metastatic brain cancer with surgery scheduled for tomorrow.  Upon arrival, vitals all within normal limits.  Patient is afebrile, not tachycardic or hypoxic.  Patient aphasic at bedside.  No facial droop or  unilateral weakness.  No visual field deficits.  Discussed case with Dr. Rory Percy with neurology who will evaluate patient. He suspects aphasia related to brain tumor vs. Post-ictal state. Per old notes, it appears patient has mild expressive asphasia at baseline. No code stroke warranted per Dr. Rory Percy. Labs, CT head, UA, and CXR ordered. Discussed case with Dr. Laverta Baltimore who agrees with assessment and plan.   8:58 PM reassessed patient at bedside.  Daughter at bedside. Expressive aphasia drastically improved. Discussed plan with patient and daughter.  CBC reassuring with no leukocytosis.  Mild anemia with hemoglobin 11.8.  CMP significant for hyponatremia 130, elevated creatinine at 1.66 and BUN at 35 which appears slightly worse than baseline. IVFs given.  Chest x-ray personally reviewed which is negative for any acute abnormalities.  Demonstrated known nodule. UA with moderate leukocytes, no bacteria. EKG with NSR. No signs of acute ischemia.  Discussed case with Dr. Nevada Crane with [facility] who agrees to admit patient. MRI brain pending. Once results are available, reconsult neurology based on findings.  Final Clinical Impression(s) / ED Diagnoses Final diagnoses:  Seizure [facility])  Aphasia    Rx / DC Orders ED Discharge Orders     None        Karie Kirks 04/07/21 2338    Margette Fast, MD 04/12/21 1246

## 2021-04-07 NOTE — Consult Note (Addendum)
Neurology Consultation  Reason for Consult: Seizure, expressive aphasia Referring Physician: Dr. Nanda Quinton  CC: Word finding difficulty, seizure  History is obtained from: Chart, patient, patient's daughter  HPI: Marie Hensley is a 75 y.o. female past medical history of a left hemispheric brain metastasis from lung cancer, history of seizures, on Keppra 500 twice daily, status post stereotactic radiosurgery for brain metastasis yesterday with Dr. Venetia Constable and plan for OR tomorrow, presented to the emergency room after having had a seizure at home that was unwitnessed that she lives alone followed by increasing word finding difficulty. At baseline she has mild expressive aphasia but this was worsened after the seizure.  Similar episode happened in June when her brain mass was diagnosed. She is returning back to baseline but not at her full baseline yet according to the daughter. Patient was very pleasant and cooperative during the exam and seem to have a good receptive ability as well as repetition along with some evidence of expressive aphasia and broken fluency. This was discussed with me and a code stroke was not activated given the known left-sided brain mass and similar presentation with seizure and postictal aphasia last month.  Patient is currently on Keppra 500 twice daily.  She was on a tapering dose of steroids and currently is not taking any steroids.   ROS: Full ROS was performed and is negative except as noted in the HPI.   Past Medical History:  Diagnosis Date   Arthritis    Brain tumor ([place])    Depression    Dyspnea    WITH EXERTION-SEEING DR [name]   HTN (hypertension)    Hyperlipemia    Seizures ([facility])         Family History  Problem Relation Age of Onset   Addison's disease Daughter    Hypertension Mother        died at 22   Alcohol abuse Father      Social History:   reports that she quit smoking about 31 years ago. Her smoking use included  cigarettes. She has a 10.00 pack-year smoking history. She has never used smokeless tobacco. She reports current alcohol use. She reports that she does not use drugs.  Medications No current facility-administered medications for this encounter.  Current Outpatient Medications:    acetaminophen (TYLENOL) 500 MG tablet, Take 1,000 mg by mouth every 6 (six) hours as needed for mild pain (or arthritis)., Disp: , Rfl:    atorvastatin (LIPITOR) 40 MG tablet, Take 1 tablet (40 mg total) by mouth at bedtime., Disp: 90 tablet, Rfl: 3   cetirizine (ZYRTEC) 10 MG tablet, Take 10 mg by mouth daily as needed for allergies or rhinitis., Disp: , Rfl:    citalopram (CELEXA) 20 MG tablet, Take 1 tablet (20 mg total) by mouth at bedtime., Disp: 90 tablet, Rfl: 1   fluticasone (FLONASE) 50 MCG/ACT nasal spray, Place 1 spray into both nostrils daily as needed for allergies., Disp: , Rfl:    levETIRAcetam (KEPPRA) 500 MG tablet, Take 1 tablet (500 mg total) by mouth 2 (two) times daily., Disp: 60 tablet, Rfl: 0   lisinopril-hydrochlorothiazide (ZESTORETIC) 10-12.5 MG tablet, Take 1 tablet by mouth daily. (Patient taking differently: Take 1 tablet by mouth at bedtime.), Disp: 90 tablet, Rfl: 3   loperamide (IMODIUM A-D) 2 MG tablet, Take 2 mg by mouth 2 (two) times daily as needed for diarrhea or loose stools., Disp: , Rfl:    propranolol ER (INDERAL LA) 60 MG 24 hr  capsule, Take 1 capsule (60 mg total) by mouth at bedtime. (Patient taking differently: Take 60 mg by mouth in the morning.), Disp: 90 capsule, Rfl: 3   methylPREDNISolone (MEDROL DOSEPAK) 4 MG TBPK tablet, Take by mouth. (Patient not taking: No sig reported), Disp: , Rfl:   Exam: Current vital signs: BP 105/64 (BP Location: Right Arm)   Pulse 68   Temp 97.6 F (36.4 C) (Oral)   Resp 14   Ht 5\' 4"  (1.626 m)   Wt 72.6 kg   SpO2 97%   BMI 27.46 kg/m  Vital signs in last 24 hours: Temp:  [97.6 F (36.4 C)] 97.6 F (36.4 C) (07/27 1857) Pulse  Rate:  [68] 68 (07/27 1857) Resp:  [14] 14 (07/27 1857) BP: (105)/(64) 105/64 (07/27 1857) SpO2:  [97 %-100 %] 97 % (07/27 1857) Weight:  [72.6 kg] 72.6 kg (07/27 1858) General: Awake alert in no distress [place] normocephalic/atraumatic Lungs: Clear Prevascular: Regular rhythm Abdomen nondistended nontender Extremities warm well perfused Neurological exam She is awake alert oriented x3 Her speech has mild to moderate expressive aphasia, she is able to repeat with dysarthric speech, she is able to name objects, she is able to follow commands.  Her fluency is reduced. Cranial nerves: Pupils equal round react light, extraocular movements intact, visual fields full, face appears grossly symmetric with questionable right nasolabial fold flattening that is very mild, tongue and palate midline. Motor examination with all 4 extremities antigravity with subtle right-sided upper extremity weakness 4+/5 while the rest of the extremities are 5/5. Sensation intact to touch Coordination with no dysmetria Gait testing deferred at this time   Labs I have reviewed labs in epic and the results pertinent to this consultation are:  CBC    Component Value Date/Time   WBC 9.4 04/07/2021 1927   RBC 3.66 (L) 04/07/2021 1927   HGB 11.8 (L) 04/07/2021 1927   HCT 36.0 04/07/2021 1927   PLT 184 04/07/2021 1927   MCV 98.4 04/07/2021 1927   MCH 32.2 04/07/2021 1927   MCHC 32.8 04/07/2021 1927   RDW 13.2 04/07/2021 1927   LYMPHSABS 2.2 04/07/2021 1927   MONOABS 0.7 04/07/2021 1927   EOSABS 0.1 04/07/2021 1927   BASOSABS 0.0 04/07/2021 1927    CMP     Component Value Date/Time   NA 133 (L) 04/06/2021 1448   K 4.1 04/06/2021 1448   CL 101 04/06/2021 1448   CO2 25 04/06/2021 1448   GLUCOSE 127 (H) 04/06/2021 1448   BUN 33 (H) 04/06/2021 1448   CREATININE 1.46 (H) 04/06/2021 1448   CALCIUM 9.2 04/06/2021 1448   PROT 7.5 03/04/2021 1320   ALBUMIN 4.1 03/04/2021 1320   AST 23 03/04/2021 1320    ALT 15 03/04/2021 1320   ALKPHOS 52 03/04/2021 1320   BILITOT 1.1 03/04/2021 1320   GFRNONAA 37 (L) 04/06/2021 1448    Lipid Panel     Component Value Date/Time   CHOL 206 (H) 11/19/2020 1358   TRIG 174.0 (H) 11/19/2020 1358   HDL 69.90 11/19/2020 1358   CHOLHDL 3 11/19/2020 1358   VLDL 34.8 11/19/2020 1358   LDLCALC 101 (H) 11/19/2020 1358   LDLDIRECT 109.0 04/20/2020 0832     Imaging I have reviewed the images obtained: MRI of the brain from 04/02/2021 with solitary metastasis of the left middle frontal lobe.  Interval growth since prior MRI of 03/04/2021.  Progressive vasogenic edema with mild midline shift compared to the prior study.  No second lesion  identified.  Assessment: Breakthrough seizure in a patient with known left middle frontal solitary metastasis from lung cancer. Status post stereotactic radiation yesterday along with plans for tumor resection tomorrow. Current worsening expressive aphasia likely postictal versus secondary to postradiation changes.  Recommendations: Give additional Keppra 1 g IV now. Increase Keppra dose to 750 twice daily. Seizure precautions Dexamethasone 10 mg IV x1. MRI brain with and without contrast Consider EEG if not back to baseline by AM Further dosing of steroids based on MRI findings and OR. Have reached out to Dr. Venetia Constable, to inform him that the patient is in the hospital and will await further discussion regarding OR tomorrow. Patient will definitely benefit from staying overnight due to the fact that she had recurrent seizures on the current antiepileptic doses and has a brain lesion.  Plan was discussed with the ER APP Charmaine Downs over the phone.  -- Amie Portland, MD Neurologist [facility] Pager: [phone]   Addendum Discussed with Dr. Venetia Constable over the phone Patient still on the schedule for OR tomorrow morning. Recommends admission to hospitalist and n.p.o. after midnight. Further  recommendations per neurosurgery postprocedure. Neurology will be available to assist as needed. Plan relayed to ER APP Charmaine Downs and Dr. Laverta Baltimore in the ER.  -- Amie Portland, MD Neurologist [facility] Pager: [phone]

## 2021-04-07 NOTE — H&P (Addendum)
History and Physical  Marie Hensley DUK:[id number] DOB: [birth year] DOA: 04/07/2021  Referring physician: Karel Jarvis, PA- EDP. PCP: [name], MD  Outpatient Specialists: Neurology Patient coming from: Home  Chief Complaint: Word finding difficulty, seizure  HPI: Marie Hensley is a 75 y.o. female with medical history significant for lung cancer with mets to the brain, seizure disorder related to brain tumor, status post stereotactic radiosurgery for brain metastasis on 04/06/2021 by Dr. Zada Finders and plan for OR tomorrow, she presents after having an unwitnessed seizure activity at home.  This was followed by word finding difficulty.  At baseline she has mild expressive aphasia but this was worsened after having the seizure.  She had a similar presentation last month, after having a seizure she had postictal aphasia.  She was brought into the ED for further evaluation.  Seen by neurology with recommendation for admission.  Patient has planned neurosurgery procedure on 04/08/2021.-Neurology will follow along in consultation.  Patient admitted to hospitalist services.  N.p.o. after midnight per neurosurgery's recommendations.  ED Course:  Temperature 98.5.  BP 115/64, pulse 66, respiration rate 17, O2 saturation 98% on room air.  Lab studies remarkable for serum sodium 130, potassium 4.2, serum bicarb 23, glucose 107, BUN 35, creatinine 1.66 from baseline of 1.4, GFR 32 from baseline of 38.  WBC 9.4, hemoglobin 11.8, platelet count 184.  Review of Systems: Review of systems as noted in the HPI. All other systems reviewed and are negative.   Past Medical History:  Diagnosis Date   Arthritis    Brain tumor ([place])    Depression    Dyspnea    WITH EXERTION-SEEING DR [name]   HTN (hypertension)    Hyperlipemia    Seizures (HCC)    Past Surgical History:  Procedure Laterality Date   APPENDECTOMY  1982   EYE SURGERY Bilateral    CATARACTS   OOPHORECTOMY Right    VIDEO  BRONCHOSCOPY WITH ENDOBRONCHIAL NAVIGATION Left 12/23/2020   Procedure: VIDEO BRONCHOSCOPY WITH ENDOBRONCHIAL NAVIGATION;  Surgeon: Tyler Pita, MD;  Location: [facility] ORS;  Service: Pulmonary;  Laterality: Left;   VIDEO BRONCHOSCOPY WITH ENDOBRONCHIAL NAVIGATION Left 02/01/2021   Procedure: ROBOTIC ASSISTED VIDEO BRONCHOSCOPY WITH ENDOBRONCHIAL NAVIGATION;  Surgeon: Tyler Pita, MD;  Location: [facility] ORS;  Service: Pulmonary;  Laterality: Left;   VIDEO BRONCHOSCOPY WITH ENDOBRONCHIAL ULTRASOUND Left 12/23/2020   Procedure: VIDEO BRONCHOSCOPY WITH ENDOBRONCHIAL ULTRASOUND;  Surgeon: Tyler Pita, MD;  Location: [facility] ORS;  Service: Pulmonary;  Laterality: Left;   VIDEO BRONCHOSCOPY WITH ENDOBRONCHIAL ULTRASOUND Left 02/01/2021   Procedure: ROBOTIC ASSITSTED VIDEO BRONCHOSCOPY WITH ENDOBRONCHIAL ULTRASOUND;  Surgeon: Tyler Pita, MD;  Location: [facility] ORS;  Service: Pulmonary;  Laterality: Left;    Social History:  reports that she quit smoking about 31 years ago. Her smoking use included cigarettes. She has a 10.00 pack-year smoking history. She has never used smokeless tobacco. She reports current alcohol use. She reports that she does not use drugs.   No Known Allergies  Family History  Problem Relation Age of Onset   Addison's disease Daughter    Hypertension Mother        died at 66   Alcohol abuse Father       Prior to Admission medications   Medication Sig Start Date End Date Taking? Authorizing Provider  acetaminophen (TYLENOL) 500 MG tablet Take 1,000 mg by mouth every 6 (six) hours as needed for mild pain (or arthritis).   Yes [provider]  atorvastatin (  LIPITOR) 40 MG tablet Take 1 tablet (40 mg total) by mouth at bedtime. 03/03/21  Yes [name], MD  cetirizine (ZYRTEC) 10 MG tablet Take 10 mg by mouth daily as needed for allergies or rhinitis.   Yes [provider]  citalopram (CELEXA) 20 MG tablet Take 1 tablet (20 mg total) by mouth  at bedtime. 02/12/21  Yes [name], MD  fluticasone (FLONASE) 50 MCG/ACT nasal spray Place 1 spray into both nostrils daily as needed for allergies.   Yes [provider]  levETIRAcetam (KEPPRA) 500 MG tablet Take 1 tablet (500 mg total) by mouth 2 (two) times daily. 03/04/21 05/22/21 Yes Badalamente, Rudell Cobb, PA-C  lisinopril-hydrochlorothiazide (ZESTORETIC) 10-12.5 MG tablet Take 1 tablet by mouth daily. Patient taking differently: Take 1 tablet by mouth at bedtime. 11/19/20  Yes [name], MD  loperamide (IMODIUM A-D) 2 MG tablet Take 2 mg by mouth 2 (two) times daily as needed for diarrhea or loose stools.   Yes [provider]  propranolol ER (INDERAL LA) 60 MG 24 hr capsule Take 1 capsule (60 mg total) by mouth at bedtime. Patient taking differently: Take 60 mg by mouth in the morning. 03/03/21  Yes [name], MD  methylPREDNISolone (MEDROL DOSEPAK) 4 MG TBPK tablet Take by mouth. Patient not taking: No sig reported 03/25/21   [provider]    Physical Exam: BP 102/63 (BP Location: Right Arm)   Pulse 61   Temp 98.5 F (36.9 C) (Oral)   Resp 15   Ht 5\' 4"  (1.626 m)   Wt 72.6 kg   SpO2 97%   BMI 27.46 kg/m   General: 75 y.o. year-old female well developed well nourished in no acute distress.  Alert and oriented x3.  Mild expressive aphasia. Cardiovascular: Regular rate and rhythm with no rubs or gallops.  No thyromegaly or JVD noted.  No lower extremity edema. 2/4 pulses in all 4 extremities. Respiratory: Clear to auscultation with no wheezes or rales. Good inspiratory effort. Abdomen: Soft nontender nondistended with normal bowel sounds x4 quadrants. Muskuloskeletal: No cyanosis, clubbing or edema noted bilaterally Neuro: CN II-XII intact, strength, sensation, reflexes.  Mild expressive aphasia.  Mild right upper extremity weakness 4 out of 5 strength. Skin: No ulcerative lesions noted or rashes Psychiatry: Judgement and insight appear  normal. Mood is appropriate for condition and setting          Labs on Admission:  Basic Metabolic Panel: Recent Labs  Lab 04/06/21 1448 04/07/21 1927  NA 133* 130*  K 4.1 4.2  CL 101 100  CO2 25 23  GLUCOSE 127* 107*  BUN 33* 35*  CREATININE 1.46* 1.66*  CALCIUM 9.2 8.7*   Liver Function Tests: Recent Labs  Lab 04/07/21 1927  AST 28  ALT 22  ALKPHOS 44  BILITOT 0.9  PROT 6.3*  ALBUMIN 3.5   No results for input(s): LIPASE, AMYLASE in the last 168 hours. No results for input(s): AMMONIA in the last 168 hours. CBC: Recent Labs  Lab 04/06/21 1448 04/07/21 1927  WBC 7.4 9.4  NEUTROABS  --  6.3  HGB 12.8 11.8*  HCT 38.1 36.0  MCV 98.2 98.4  PLT 189 184   Cardiac Enzymes: No results for input(s): CKTOTAL, CKMB, CKMBINDEX, TROPONINI in the last 168 hours.  BNP (last 3 results) No results for input(s): BNP in the last 8760 hours.  ProBNP (last 3 results) Recent Labs    11/19/20 1358  PROBNP 17.0  CBG: No results for input(s): GLUCAP in the last 168 hours.  Radiological Exams on Admission: DG Chest Portable 1 View  Result Date: 04/07/2021 CLINICAL DATA:  Aphasia.  Seizures. EXAM: PORTABLE CHEST 1 VIEW COMPARISON:  Chest radiograph 02/01/2021,, [place] [address] FINDINGS: Lung volumes are low. Upper normal heart size likely accentuated by low lung volumes. Aortic atherosclerosis. Known left upper lobe nodule is faintly visualized. Minimal atelectasis in the left lung base. No acute airspace disease. No pneumothorax or large pleural effusion. No acute osseous abnormalities are seen. IMPRESSION: 1. Low lung volumes with left basilar atelectasis. 2. Known left upper lobe nodule is faintly visualized. Electronically Signed   By: Keith Rake M.D.   On: 04/07/2021 20:00    EKG: I independently viewed the EKG done and my findings are as followed: Sinus rhythm rate of 67, nonspecific ST-T changes.  QTc 439.  Assessment/Plan Present on Admission: **None**  Active  Problems:   Seizure ([place])  Breakthrough seizure in the setting of known brain tumor History of lung cancer with mets to brain. Seizure disorder On Keppra 500 twice daily In the ED she has received a loading dose of Keppra and Decadron Seen by the neurology Plan for neurosurgery on 04/08/2021, n.p.o. after midnight  Chronic anxiety/depression Resume home Celexa  Hyperlipidemia Resume home Lipitor  CKD 3B Appears to be at her baseline creatinine 1.4 with GFR of 37.  Steroid-induced hyperglycemia Insulin sliding scale   DVT prophylaxis: SCDs  Code Status: Full code  Family Communication: None at bedside.  Disposition Plan: Admitted to progressive unit  Consults called: Neurology and neurosurgery consulted by EDP.  Admission status: Inpatient status.  Patient will require at least 2 midnights for further evaluation and treatment of present condition.   Status is: Inpatient    Dispo:  Patient From: Home  Planned Disposition: Home, possibly on 04/10/21 or when neurosurgery signs off.  Medically stable for discharge: No         Kayleen Memos MD [facility] Pager [phone]  If 7PM-7AM, please contact night-coverage [email or web address] Password [facility]  04/07/2021, 11:49 PM

## 2021-04-08 ENCOUNTER — Other Ambulatory Visit: Payer: Self-pay | Admitting: Radiation Therapy

## 2021-04-08 ENCOUNTER — Inpatient Hospital Stay (HOSPITAL_COMMUNITY): Admission: RE | Admit: 2021-04-08 | Payer: Medicare HMO | Source: Home / Self Care | Admitting: Neurological Surgery

## 2021-04-08 ENCOUNTER — Inpatient Hospital Stay (HOSPITAL_COMMUNITY): Payer: Medicare HMO | Admitting: Physician Assistant

## 2021-04-08 ENCOUNTER — Encounter (HOSPITAL_COMMUNITY)
Admission: EM | Disposition: A | Discharge status: Home / Self Care | Payer: Self-pay | Source: Home / Self Care | Attending: Neurological Surgery

## 2021-04-08 ENCOUNTER — Inpatient Hospital Stay (HOSPITAL_COMMUNITY): Payer: Medicare HMO | Admitting: Certified Registered Nurse Anesthetist

## 2021-04-08 DIAGNOSIS — D496 Neoplasm of unspecified behavior of brain: Secondary | ICD-10-CM | POA: Diagnosis present

## 2021-04-08 HISTORY — PX: CRANIOTOMY: SHX93

## 2021-04-08 HISTORY — PX: APPLICATION OF CRANIAL NAVIGATION: SHX6578

## 2021-04-08 LAB — BASIC METABOLIC PANEL
Anion gap: 9 (ref 5–15)
BUN: 38 mg/dL — ABNORMAL HIGH (ref 8–23)
CO2: 21 mmol/L — ABNORMAL LOW (ref 22–32)
Calcium: 8.7 mg/dL — ABNORMAL LOW (ref 8.9–10.3)
Chloride: 106 mmol/L (ref 98–111)
Creatinine, Ser: 1.47 mg/dL — ABNORMAL HIGH (ref 0.44–1.00)
GFR, Estimated: 37 mL/min — ABNORMAL LOW (ref >60)
Glucose, Bld: 154 mg/dL — ABNORMAL HIGH (ref 70–99)
Potassium: 4.2 mmol/L (ref 3.5–5.1)
Sodium: 136 mmol/L (ref 135–145)

## 2021-04-08 LAB — GLUCOSE, CAPILLARY
Glucose-Capillary: 111 mg/dL — ABNORMAL HIGH (ref 70–99)
Glucose-Capillary: 131 mg/dL — ABNORMAL HIGH (ref 70–99)
Glucose-Capillary: 148 mg/dL — ABNORMAL HIGH (ref 70–99)
Glucose-Capillary: 154 mg/dL — ABNORMAL HIGH (ref 70–99)

## 2021-04-08 LAB — CBC
HCT: 36 % (ref 36.0–46.0)
Hemoglobin: 11.8 g/dL — ABNORMAL LOW (ref 12.0–15.0)
MCH: 32.2 pg (ref 26.0–34.0)
MCHC: 32.8 g/dL (ref 30.0–36.0)
MCV: 98.4 fL (ref 80.0–100.0)
Platelets: 163 10*3/uL (ref 150–400)
RBC: 3.66 MIL/uL — ABNORMAL LOW (ref 3.87–5.11)
RDW: 13.1 % (ref 11.5–15.5)
WBC: 6.2 10*3/uL (ref 4.0–10.5)
nRBC: 0 % (ref 0.0–0.2)

## 2021-04-08 LAB — POCT I-STAT 7, (LYTES, BLD GAS, ICA,H+H)
Acid-base deficit: 4 mmol/L — ABNORMAL HIGH (ref 0.0–2.0)
Bicarbonate: 21.7 mmol/L (ref 20.0–28.0)
Calcium, Ion: 1.22 mmol/L (ref 1.15–1.40)
HCT: 29 % — ABNORMAL LOW (ref 36.0–46.0)
Hemoglobin: 9.9 g/dL — ABNORMAL LOW (ref 12.0–15.0)
O2 Saturation: 100 %
Potassium: 4.4 mmol/L (ref 3.5–5.1)
Sodium: 136 mmol/L (ref 135–145)
TCO2: 23 mmol/L (ref 22–32)
pCO2 arterial: 38.8 mmHg (ref 32.0–48.0)
pH, Arterial: 7.356 (ref 7.350–7.450)
pO2, Arterial: 223 mmHg — ABNORMAL HIGH (ref 83.0–108.0)

## 2021-04-08 LAB — MRSA NEXT GEN BY PCR, NASAL: MRSA by PCR Next Gen: NOT DETECTED

## 2021-04-08 LAB — MAGNESIUM: Magnesium: 1.7 mg/dL (ref 1.7–2.4)

## 2021-04-08 LAB — PHOSPHORUS: Phosphorus: 3.1 mg/dL (ref 2.5–4.6)

## 2021-04-08 LAB — PREPARE RBC (CROSSMATCH)

## 2021-04-08 LAB — ABO/RH: ABO/RH(D): O POS

## 2021-04-08 IMAGING — MR MR HEAD WO/W CM
14 of 16 series · 40 of 48 positions shown · IV contrast (gadavist)
Comparison: Recent MRI from [DATE].

CLINICAL DATA: Initial evaluation for acute aphasia, neuro deficit.

EXAM:
MRI HEAD WITHOUT AND WITH CONTRAST
TECHNIQUE: Multiplanar, multiecho pulse sequences of the brain and surrounding
structures were obtained without and with intravenous contrast.
CONTRAST:  7.5mL GADAVIST GADOBUTROL 1 MMOL/ML IV SOLN

[Series 5: DWI · axial · 3.0mm · 0.88mm/px · z∈[-136,+14]mm · 6 of 104 slices shown (1 of 4)]
[im 1/104]
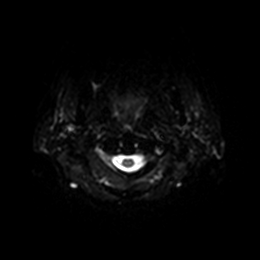
[im 21/104]
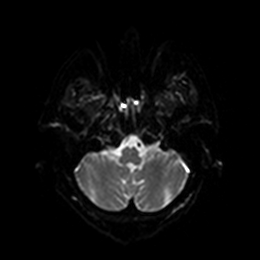
[im 42/104]
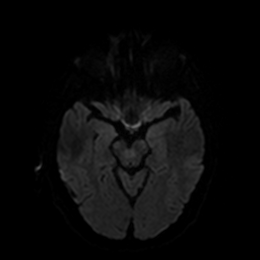
[im 62/104]
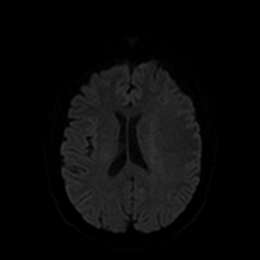
[im 83/104]
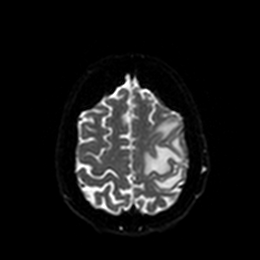
[im 104/104]
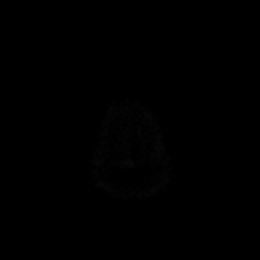

[Series 6: DWI · axial · 3.0mm · 0.88mm/px · z∈[-136,+14]mm · 2 of 52 slices shown (2 of 4)]
[im 1/52]
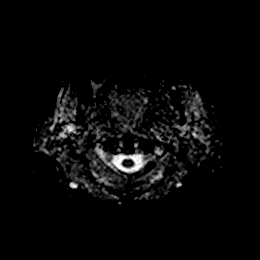
[im 52/52]
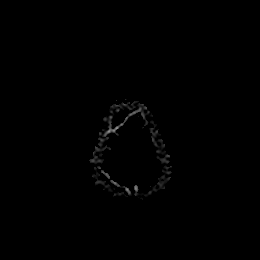

[Series 7: DWI · coronal · 4.0mm · 0.88mm/px · 4 of 72 slices shown (3 of 4)]
[im 1/72]
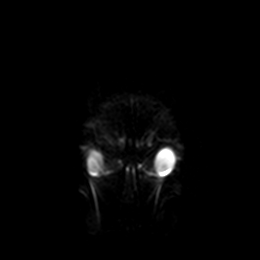
[im 24/72]
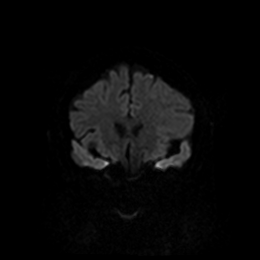
[im 48/72]
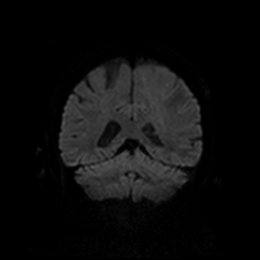
[im 72/72]
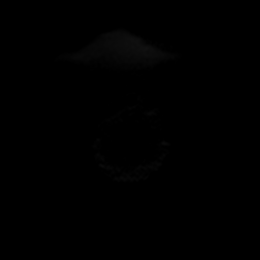

[Series 8: DWI · coronal · 4.0mm · 0.88mm/px · 1 of 36 slices shown (4 of 4)]
[im 1/36]
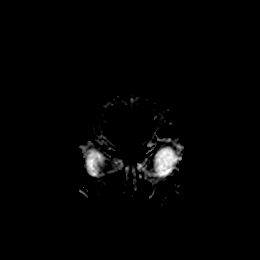

[Series 9: T1 · sagittal · 5.0mm · 0.75mm/px · 2 of 25 slices shown]
[im 1/25]
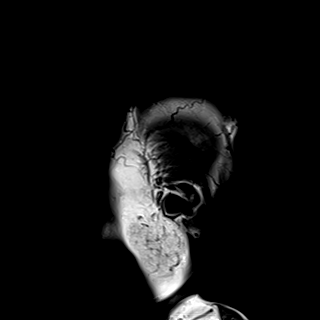
[im 25/25]
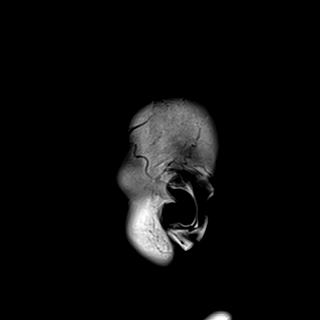

[Series 10: T2 · axial · 5.0mm · 0.72mm/px · z∈[-140,+18]mm · 2 of 28 slices shown]
[im 1/28]
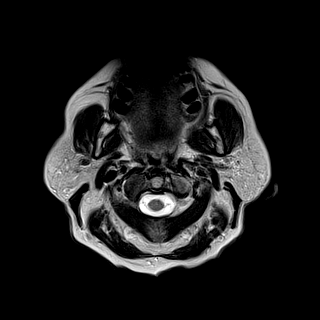
[im 28/28]
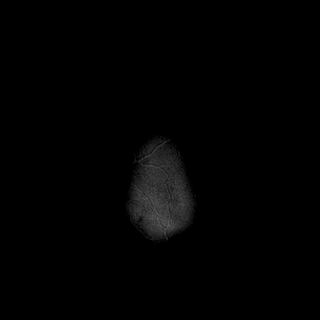

[Series 11: FLAIR · axial · 5.0mm · 0.45mm/px · z∈[-140,+19]mm · 2 of 28 slices shown]
[im 1/28]
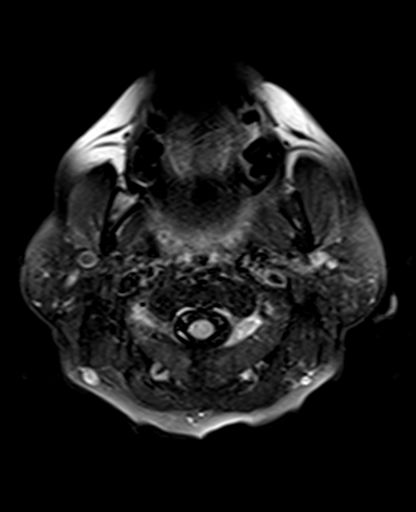
[im 28/28]
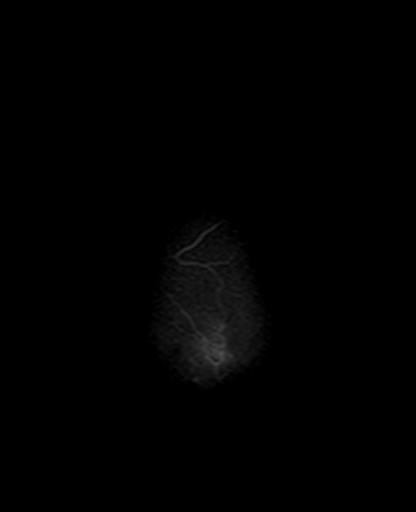

[Series 12: mag_images · axial · 3.0mm · 0.90mm/px · z∈[-141,+20]mm · 4 of 56 slices shown]
[im 1/56]
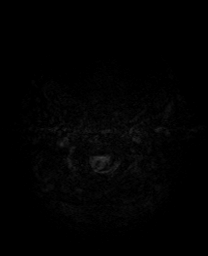
[im 19/56]
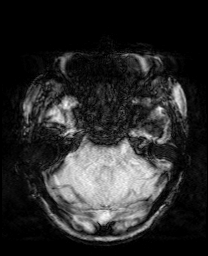
[im 37/56]
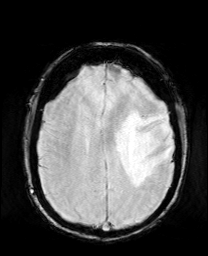
[im 56/56]
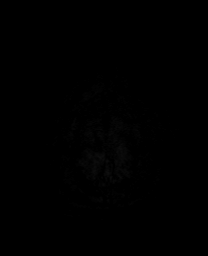

[Series 13: pha_images · axial · 3.0mm · 0.90mm/px · z∈[-141,+20]mm · 4 of 56 slices shown]
[im 1/56]
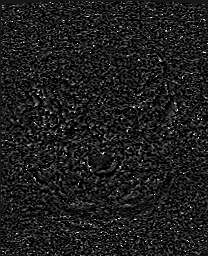
[im 19/56]
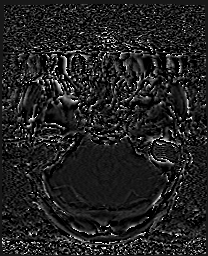
[im 37/56]
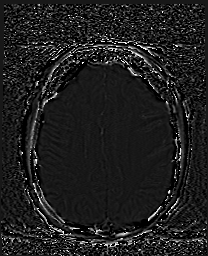
[im 56/56]
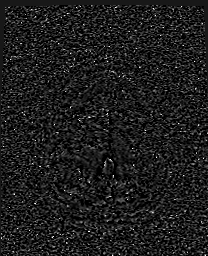

[Series 14: swi_images · axial · 3.0mm · 0.90mm/px · z∈[-141,+20]mm · 4 of 56 slices shown]
[im 1/56]
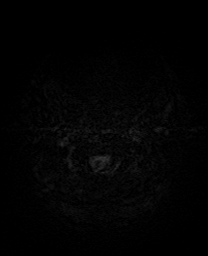
[im 19/56]
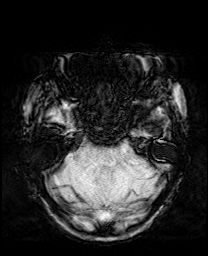
[im 37/56]
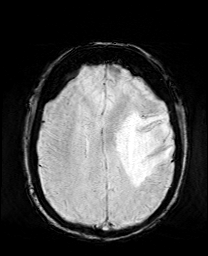
[im 56/56]
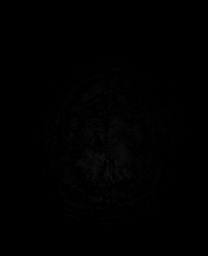

[Series 15: mip_images(sw) · axial · 24.0mm · 0.90mm/px · z∈[-131,+10]mm · 3 of 49 slices shown]
[im 1/49]
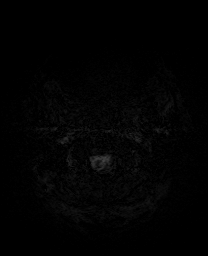
[im 25/49]
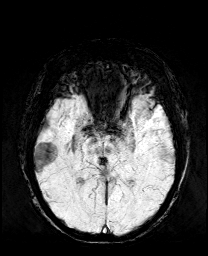
[im 49/49]
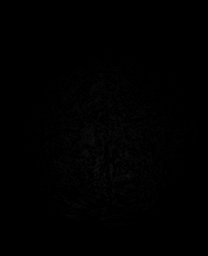

[Series 17: T2 post-contrast · coronal · 5.0mm · 0.72mm/px · 2 of 30 slices shown]
[im 1/30]
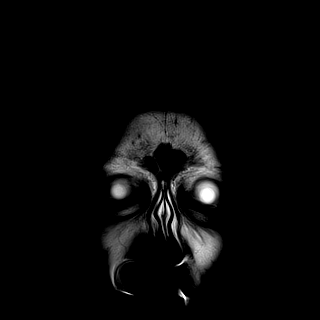
[im 30/30]
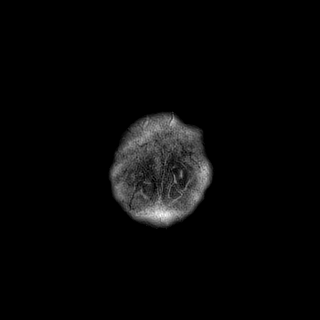

[Series 19: T1 post-contrast · coronal · 5.0mm · 0.34mm/px · 2 of 30 slices shown (1 of 2)]
[im 1/30]
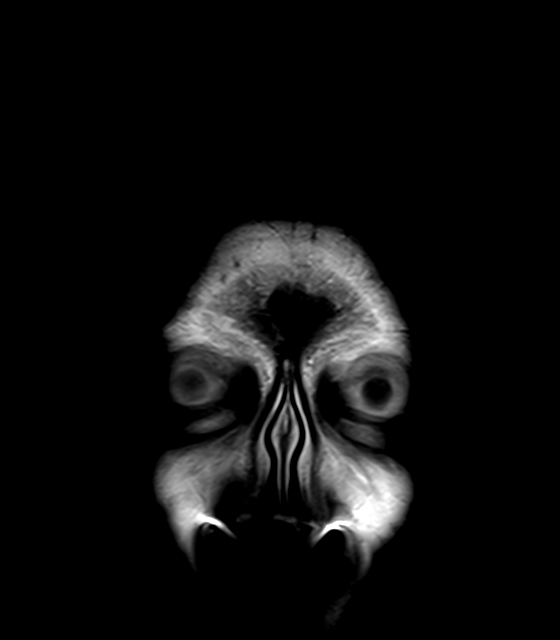
[im 30/30]
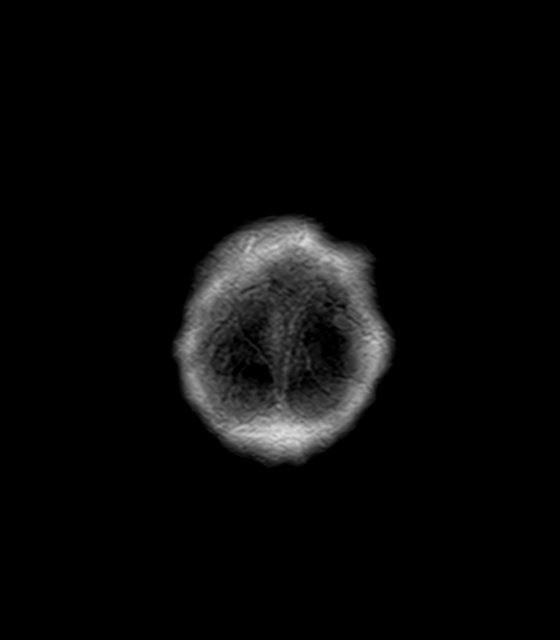

[Series 20: T1 post-contrast · sagittal · 5.0mm · 0.72mm/px · 2 of 25 slices shown (2 of 2)]
[im 1/25]
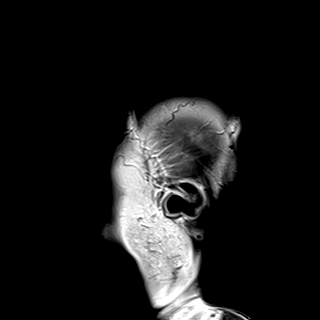
[im 25/25]
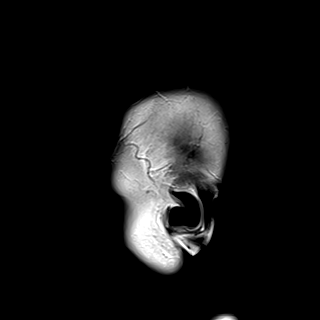

[40 of 48 positions shown; findings below may reference images not displayed]

FINDINGS: Brain: Mild age-related cerebral and cerebellar atrophy, stable.

Known solitary metastasis again seen at the peripheral left frontal
convexity, slightly increased in size on today's exam now measuring
2.3 x 1.7 x 1.9 cm, previously 2.1 x 1.5 x 1.6 cm when measured in a
similar fashion. Margins of the lesion are somewhat more ill-defined
as compared to prior. Overlying mild dural thickening and
enhancement noted, similar. Associated vasogenic edema throughout
the adjacent left frontal parietal region with up to 4 mm of
left-to-right shift, similar to prior. No other new metastatic
lesions or abnormal enhancement seen elsewhere within the brain.

No abnormal foci of restricted diffusion to suggest acute or
subacute ischemia. Gray-white matter differentiation otherwise
maintained. No areas of chronic cortical infarction. No significant
cerebral white matter disease for age. No evidence for acute or
chronic intracranial hemorrhage.

No other mass lesion. No hydrocephalus or extra-axial fluid
collection. Pituitary gland and suprasellar region normal.

Vascular: Major intracranial vascular flow voids are maintained.

Skull and upper cervical spine: Craniocervical junction within
normal limits. Bone marrow signal intensity normal. No focal marrow
replacing lesion. Hyperostosis frontalis interna noted. No scalp
soft tissue abnormality.

Sinuses/Orbits: Prior bilateral ocular lens replacement. Globes and
orbital soft tissues demonstrate no acute finding. Paranasal sinuses
are largely clear. No mastoid effusion. Inner ear structures grossly
normal.

Other: None.
IMPRESSION: 1. Slight interval increase in size of left frontal convexity
metastasis as compared to MRI from [DATE]. Associated vasogenic
edema and 4 mm of left-to-right shift is otherwise relatively
stable. No new lesions identified.
2. Otherwise essentially negative brain MRI for age. No other acute
intracranial abnormality.

## 2021-04-08 SURGERY — CRANIOTOMY TUMOR EXCISION
Anesthesia: General

## 2021-04-08 MED ORDER — PHENYLEPHRINE HCL-NACL 10-0.9 MG/250ML-% IV SOLN
INTRAVENOUS | Status: AC
  Filled 2021-04-08: qty 750

## 2021-04-08 MED ORDER — PHENYLEPHRINE HCL-NACL 10-0.9 MG/250ML-% IV SOLN
INTRAVENOUS | Status: DC | PRN
  Administered 2021-04-08: 20 ug/min via INTRAVENOUS

## 2021-04-08 MED ORDER — SODIUM CHLORIDE 0.9 % IV SOLN: INTRAVENOUS | Status: DC | PRN

## 2021-04-08 MED ORDER — HEPARIN SODIUM (PORCINE) 5000 UNIT/ML IJ SOLN
5000.0000 [IU] | Freq: Three times a day (TID) | INTRAMUSCULAR | Status: DC
  Administered 2021-04-10 – 2021-04-11 (×4): 5000 [IU] via SUBCUTANEOUS
  Filled 2021-04-08 (×4): qty 1

## 2021-04-08 MED ORDER — ROCURONIUM BROMIDE 10 MG/ML (PF) SYRINGE
PREFILLED_SYRINGE | INTRAVENOUS | Status: DC | PRN
  Administered 2021-04-08: 40 mg via INTRAVENOUS
  Administered 2021-04-08: 60 mg via INTRAVENOUS

## 2021-04-08 MED ORDER — LIDOCAINE-EPINEPHRINE 1 %-1:100000 IJ SOLN
INTRAMUSCULAR | Status: AC
  Filled 2021-04-08: qty 1

## 2021-04-08 MED ORDER — HYDROMORPHONE HCL 1 MG/ML IJ SOLN
0.5000 mg | INTRAMUSCULAR | Status: DC | PRN
  Administered 2021-04-08 – 2021-04-09 (×3): 0.5 mg via INTRAVENOUS
  Filled 2021-04-08 (×3): qty 1

## 2021-04-08 MED ORDER — PROMETHAZINE HCL 25 MG PO TABS
12.5000 mg | ORAL_TABLET | ORAL | Status: DC | PRN
  Administered 2021-04-09: 25 mg via ORAL
  Filled 2021-04-08: qty 1

## 2021-04-08 MED ORDER — CHLORHEXIDINE GLUCONATE CLOTH 2 % EX PADS
6.0000 | MEDICATED_PAD | Freq: Every day | CUTANEOUS | Status: DC
  Administered 2021-04-08 – 2021-04-10 (×3): 6 via TOPICAL

## 2021-04-08 MED ORDER — HYDROCODONE-ACETAMINOPHEN 5-325 MG PO TABS: 1.0000 | ORAL_TABLET | ORAL | Status: DC | PRN

## 2021-04-08 MED ORDER — CEFAZOLIN SODIUM-DEXTROSE 2-4 GM/100ML-% IV SOLN
2.0000 g | Freq: Three times a day (TID) | INTRAVENOUS | Status: AC
  Administered 2021-04-08 – 2021-04-09 (×2): 2 g via INTRAVENOUS
  Filled 2021-04-08 (×4): qty 100

## 2021-04-08 MED ORDER — CEFAZOLIN SODIUM-DEXTROSE 2-3 GM-%(50ML) IV SOLR
INTRAVENOUS | Status: DC | PRN
  Administered 2021-04-08: 2 g via INTRAVENOUS

## 2021-04-08 MED ORDER — ONDANSETRON HCL 4 MG/2ML IJ SOLN
4.0000 mg | Freq: Four times a day (QID) | INTRAMUSCULAR | Status: DC | PRN
  Administered 2021-04-08: 4 mg via INTRAVENOUS
  Filled 2021-04-08: qty 2

## 2021-04-08 MED ORDER — POLYETHYLENE GLYCOL 3350 17 G PO PACK: 17.0000 g | PACK | Freq: Every day | ORAL | Status: DC | PRN

## 2021-04-08 MED ORDER — DEXAMETHASONE SODIUM PHOSPHATE 10 MG/ML IJ SOLN
INTRAMUSCULAR | Status: DC | PRN
  Administered 2021-04-08: 10 mg via INTRAVENOUS

## 2021-04-08 MED ORDER — ONDANSETRON HCL 4 MG/2ML IJ SOLN
INTRAMUSCULAR | Status: DC | PRN
  Administered 2021-04-08: 4 mg via INTRAVENOUS

## 2021-04-08 MED ORDER — SODIUM CHLORIDE 0.9% IV SOLUTION: Freq: Once | INTRAVENOUS | Status: DC

## 2021-04-08 MED ORDER — BACITRACIN ZINC 500 UNIT/GM EX OINT
TOPICAL_OINTMENT | CUTANEOUS | Status: AC
  Filled 2021-04-08: qty 28.35

## 2021-04-08 MED ORDER — THROMBIN 5000 UNITS EX SOLR: OROMUCOSAL | Status: DC | PRN

## 2021-04-08 MED ORDER — OXYCODONE HCL 5 MG PO TABS
5.0000 mg | ORAL_TABLET | Freq: Four times a day (QID) | ORAL | Status: DC | PRN
Start: 2021-04-08 — End: 2021-04-11
  Administered 2021-04-09: 5 mg via ORAL
  Filled 2021-04-08: qty 1

## 2021-04-08 MED ORDER — GADOBUTROL 1 MMOL/ML IV SOLN
7.5000 mL | Freq: Once | INTRAVENOUS | Status: AC | PRN
  Administered 2021-04-08: 7.5 mL via INTRAVENOUS

## 2021-04-08 MED ORDER — LIDOCAINE-EPINEPHRINE 1 %-1:100000 IJ SOLN
INTRAMUSCULAR | Status: DC | PRN
  Administered 2021-04-08: 10 mL

## 2021-04-08 MED ORDER — PROPOFOL 10 MG/ML IV BOLUS
INTRAVENOUS | Status: DC | PRN
  Administered 2021-04-08: 90 mg via INTRAVENOUS
  Administered 2021-04-08: 50 mg via INTRAVENOUS

## 2021-04-08 MED ORDER — DOCUSATE SODIUM 100 MG PO CAPS
100.0000 mg | ORAL_CAPSULE | Freq: Two times a day (BID) | ORAL | Status: DC
  Administered 2021-04-08 – 2021-04-11 (×4): 100 mg via ORAL
  Filled 2021-04-08 (×4): qty 1

## 2021-04-08 MED ORDER — LABETALOL HCL 5 MG/ML IV SOLN
INTRAVENOUS | Status: DC | PRN
  Administered 2021-04-08: 5 mg via INTRAVENOUS

## 2021-04-08 MED ORDER — THROMBIN 20000 UNITS EX SOLR: CUTANEOUS | Status: DC | PRN

## 2021-04-08 MED ORDER — BACITRACIN ZINC 500 UNIT/GM EX OINT
TOPICAL_OINTMENT | CUTANEOUS | Status: DC | PRN
  Administered 2021-04-08: 1 via TOPICAL

## 2021-04-08 MED ORDER — ACETAMINOPHEN 325 MG PO TABS
650.0000 mg | ORAL_TABLET | Freq: Four times a day (QID) | ORAL | Status: DC | PRN
  Administered 2021-04-08 – 2021-04-09 (×2): 650 mg via ORAL
  Filled 2021-04-08 (×2): qty 2

## 2021-04-08 MED ORDER — MAGNESIUM OXIDE -MG SUPPLEMENT 400 (240 MG) MG PO TABS
800.0000 mg | ORAL_TABLET | Freq: Once | ORAL | Status: DC
  Filled 2021-04-08: qty 2

## 2021-04-08 MED ORDER — THROMBIN 20000 UNITS EX SOLR
CUTANEOUS | Status: AC
  Filled 2021-04-08: qty 20000

## 2021-04-08 MED ORDER — HEMOSTATIC AGENTS (NO CHARGE) OPTIME
TOPICAL | Status: DC | PRN
Start: 2021-04-08 — End: 2021-04-08
  Administered 2021-04-08: 1 via TOPICAL

## 2021-04-08 MED ORDER — FENTANYL CITRATE (PF) 250 MCG/5ML IJ SOLN
INTRAMUSCULAR | Status: DC | PRN
  Administered 2021-04-08: 500 ug via INTRAVENOUS

## 2021-04-08 MED ORDER — THROMBIN 5000 UNITS EX SOLR
CUTANEOUS | Status: AC
  Filled 2021-04-08: qty 5000

## 2021-04-08 MED ORDER — INSULIN ASPART 100 UNIT/ML IJ SOLN
0.0000 [IU] | INTRAMUSCULAR | Status: DC
Start: 2021-04-08 — End: 2021-04-11
  Administered 2021-04-08 (×2): 1 [IU] via SUBCUTANEOUS
  Administered 2021-04-08: 2 [IU] via SUBCUTANEOUS
  Administered 2021-04-09 (×2): 1 [IU] via SUBCUTANEOUS
  Administered 2021-04-09: 2 [IU] via SUBCUTANEOUS
  Administered 2021-04-09: 1 [IU] via SUBCUTANEOUS
  Administered 2021-04-10: 3 [IU] via SUBCUTANEOUS
  Administered 2021-04-10: 2 [IU] via SUBCUTANEOUS
  Administered 2021-04-10: 0 [IU] via SUBCUTANEOUS
  Administered 2021-04-10 – 2021-04-11 (×3): 2 [IU] via SUBCUTANEOUS
  Administered 2021-04-11: 1 [IU] via SUBCUTANEOUS
  Administered 2021-04-11 (×2): 2 [IU] via SUBCUTANEOUS

## 2021-04-08 MED ORDER — ESMOLOL HCL 100 MG/10ML IV SOLN
INTRAVENOUS | Status: DC | PRN
  Administered 2021-04-08: 20 mg via INTRAVENOUS

## 2021-04-08 MED ORDER — MANNITOL 25 % IV SOLN
INTRAVENOUS | Status: DC | PRN
  Administered 2021-04-08: 37.5 g via INTRAVENOUS

## 2021-04-08 MED ORDER — SUGAMMADEX SODIUM 200 MG/2ML IV SOLN
INTRAVENOUS | Status: DC | PRN
  Administered 2021-04-08: 200 mg via INTRAVENOUS

## 2021-04-08 MED ORDER — LACTATED RINGERS IV SOLN: INTRAVENOUS | Status: DC | PRN

## 2021-04-08 MED ORDER — MELATONIN 3 MG PO TABS
3.0000 mg | ORAL_TABLET | Freq: Every evening | ORAL | Status: DC | PRN
  Filled 2021-04-08: qty 1

## 2021-04-08 MED ORDER — LACTATED RINGERS IV SOLN
INTRAVENOUS | Status: AC
Start: 2021-04-08 — End: 2021-04-10

## 2021-04-08 MED ORDER — 0.9 % SODIUM CHLORIDE (POUR BTL) OPTIME
TOPICAL | Status: DC | PRN
  Administered 2021-04-08: 3000 mL

## 2021-04-08 MED ORDER — LIDOCAINE 2% (20 MG/ML) 5 ML SYRINGE
INTRAMUSCULAR | Status: DC | PRN
  Administered 2021-04-08: 20 mg via INTRAVENOUS

## 2021-04-08 MED ORDER — PHENYLEPHRINE 40 MCG/ML (10ML) SYRINGE FOR IV PUSH (FOR BLOOD PRESSURE SUPPORT)
PREFILLED_SYRINGE | INTRAVENOUS | Status: DC | PRN
  Administered 2021-04-08 (×2): 40 ug via INTRAVENOUS
  Administered 2021-04-08: 120 ug via INTRAVENOUS
  Administered 2021-04-08: 60 ug via INTRAVENOUS
  Administered 2021-04-08: 30 ug via INTRAVENOUS
  Administered 2021-04-08: 80 ug via INTRAVENOUS

## 2021-04-08 MED ORDER — LABETALOL HCL 5 MG/ML IV SOLN: 10.0000 mg | INTRAVENOUS | Status: DC | PRN

## 2021-04-08 MED ORDER — LEVETIRACETAM IN NACL 500 MG/100ML IV SOLN
500.0000 mg | INTRAVENOUS | Status: AC
  Administered 2021-04-08: 500 mg via INTRAVENOUS
  Filled 2021-04-08: qty 100

## 2021-04-08 SURGICAL SUPPLY — 86 items
BAG COUNTER SPONGE SURGICOUNT (BAG) ×3 IMPLANT
BAND RUBBER #18 3X1/16 STRL (MISCELLANEOUS) IMPLANT
BENZOIN TINCTURE PRP APPL 2/3 (GAUZE/BANDAGES/DRESSINGS) IMPLANT
BLADE CLIPPER SURG (BLADE) ×3 IMPLANT
BLADE SAW GIGLI 16 STRL (MISCELLANEOUS) IMPLANT
BLADE SURG 15 STRL LF DISP TIS (BLADE) IMPLANT
BLADE SURG 15 STRL SS (BLADE)
BNDG GAUZE ELAST 4 BULKY (GAUZE/BANDAGES/DRESSINGS) IMPLANT
BNDG STRETCH 4X75 STRL LF (GAUZE/BANDAGES/DRESSINGS) IMPLANT
BUR ACORN 9.0 PRECISION (BURR) ×3 IMPLANT
BUR ROUND FLUTED 4 SOFT TCH (BURR) IMPLANT
BUR SPIRAL ROUTER 2.3 (BUR) ×3 IMPLANT
CANISTER SUCT 3000ML PPV (MISCELLANEOUS) ×6 IMPLANT
CATH VENTRIC 35X38 W/TROCAR LG (CATHETERS) IMPLANT
CLIP VESOCCLUDE MED 6/CT (CLIP) IMPLANT
CNTNR URN SCR LID CUP LEK RST (MISCELLANEOUS) ×2 IMPLANT
CONT SPEC 4OZ STRL OR WHT (MISCELLANEOUS) ×1
COVER BURR HOLE 7 (Orthopedic Implant) ×3 IMPLANT
COVER MAYO STAND STRL (DRAPES) IMPLANT
DECANTER SPIKE VIAL GLASS SM (MISCELLANEOUS) ×3 IMPLANT
DRAIN SUBARACHNOID (WOUND CARE) IMPLANT
DRAPE HALF SHEET 40X57 (DRAPES) ×3 IMPLANT
DRAPE MICROSCOPE LEICA (MISCELLANEOUS) IMPLANT
DRAPE NEUROLOGICAL W/INCISE (DRAPES) ×3 IMPLANT
DRAPE STERI IOBAN 125X83 (DRAPES) IMPLANT
DRAPE SURG 17X23 STRL (DRAPES) IMPLANT
DRAPE WARM FLUID 44X44 (DRAPES) ×3 IMPLANT
DRSG ADAPTIC 3X8 NADH LF (GAUZE/BANDAGES/DRESSINGS) IMPLANT
DRSG TELFA 3X8 NADH (GAUZE/BANDAGES/DRESSINGS) IMPLANT
DURAPREP 6ML APPLICATOR 50/CS (WOUND CARE) ×3 IMPLANT
ELECT REM PT RETURN 9FT ADLT (ELECTROSURGICAL) ×3
ELECTRODE REM PT RTRN 9FT ADLT (ELECTROSURGICAL) ×2 IMPLANT
EVACUATOR 1/8 PVC DRAIN (DRAIN) IMPLANT
EVACUATOR SILICONE 100CC (DRAIN) IMPLANT
FORCEPS BIPOLAR SPETZLER 8 1.0 (NEUROSURGERY SUPPLIES) ×3 IMPLANT
GAUZE 4X4 16PLY ~~LOC~~+RFID DBL (SPONGE) IMPLANT
GAUZE SPONGE 4X4 12PLY STRL (GAUZE/BANDAGES/DRESSINGS) IMPLANT
GLOVE EXAM NITRILE LRG STRL (GLOVE) IMPLANT
GLOVE EXAM NITRILE XS STR PU (GLOVE) IMPLANT
GLOVE SURG ENC MOIS LTX SZ7 (GLOVE) IMPLANT
GLOVE SURG LTX SZ7.5 (GLOVE) ×3 IMPLANT
GLOVE SURG UNDER POLY LF SZ7 (GLOVE) IMPLANT
GLOVE SURG UNDER POLY LF SZ7.5 (GLOVE) ×3 IMPLANT
GOWN STRL REUS W/ TWL LRG LVL3 (GOWN DISPOSABLE) ×4 IMPLANT
GOWN STRL REUS W/ TWL XL LVL3 (GOWN DISPOSABLE) IMPLANT
GOWN STRL REUS W/TWL 2XL LVL3 (GOWN DISPOSABLE) IMPLANT
GOWN STRL REUS W/TWL LRG LVL3 (GOWN DISPOSABLE) ×2
GOWN STRL REUS W/TWL XL LVL3 (GOWN DISPOSABLE)
HEMOSTAT POWDER KIT SURGIFOAM (HEMOSTASIS) ×3 IMPLANT
HEMOSTAT SURGICEL 2X14 (HEMOSTASIS) ×3 IMPLANT
IV NS 1000ML (IV SOLUTION) ×1
IV NS 1000ML BAXH (IV SOLUTION) ×2 IMPLANT
KIT BASIN OR (CUSTOM PROCEDURE TRAY) ×3 IMPLANT
KIT DRAIN CSF ACCUDRAIN (MISCELLANEOUS) IMPLANT
KIT TURNOVER KIT B (KITS) ×3 IMPLANT
MARKER SPHERE PSV REFLC 13MM (MARKER) ×9 IMPLANT
NEEDLE HYPO 22GX1.5 SAFETY (NEEDLE) ×3 IMPLANT
NEEDLE SPNL 18GX3.5 QUINCKE PK (NEEDLE) IMPLANT
NS IRRIG 1000ML POUR BTL (IV SOLUTION) ×9 IMPLANT
PACK CRANIOTOMY CUSTOM (CUSTOM PROCEDURE TRAY) ×3 IMPLANT
PATTIES SURGICAL .25X.25 (GAUZE/BANDAGES/DRESSINGS) IMPLANT
PATTIES SURGICAL .5 X.5 (GAUZE/BANDAGES/DRESSINGS) IMPLANT
PATTIES SURGICAL .5 X3 (DISPOSABLE) IMPLANT
PATTIES SURGICAL 1/4 X 3 (GAUZE/BANDAGES/DRESSINGS) IMPLANT
PATTIES SURGICAL 1X1 (DISPOSABLE) IMPLANT
PIN MAYFIELD SKULL DISP (PIN) ×3 IMPLANT
PLATE DOUBLE Y CMF 6H (Plate) ×6 IMPLANT
SCREW UNIII AXS SD 1.5X4 (Screw) ×39 IMPLANT
SPECIMEN JAR SMALL (MISCELLANEOUS) IMPLANT
SPONGE NEURO XRAY DETECT 1X3 (DISPOSABLE) IMPLANT
SPONGE SURGIFOAM ABS GEL 100 (HEMOSTASIS) ×3 IMPLANT
STAPLER VISISTAT 35W (STAPLE) ×3 IMPLANT
SUT ETHILON 3 0 FSL (SUTURE) IMPLANT
SUT ETHILON 3 0 PS 1 (SUTURE) IMPLANT
SUT MNCRL AB 3-0 PS2 18 (SUTURE) IMPLANT
SUT MON AB 3-0 SH 27 (SUTURE)
SUT MON AB 3-0 SH27 (SUTURE) IMPLANT
SUT NURALON 4 0 TR CR/8 (SUTURE) ×9 IMPLANT
SUT SILK 0 TIES 10X30 (SUTURE) IMPLANT
SUT VIC AB 2-0 CP2 18 (SUTURE) ×3 IMPLANT
TOWEL GREEN STERILE (TOWEL DISPOSABLE) ×3 IMPLANT
TOWEL GREEN STERILE FF (TOWEL DISPOSABLE) ×3 IMPLANT
TRAY FOLEY MTR SLVR 16FR STAT (SET/KITS/TRAYS/PACK) ×3 IMPLANT
TUBE CONNECTING 12X1/4 (SUCTIONS) ×3 IMPLANT
UNDERPAD 30X36 HEAVY ABSORB (UNDERPADS AND DIAPERS) ×3 IMPLANT
WATER STERILE IRR 1000ML POUR (IV SOLUTION) ×3 IMPLANT

## 2021-04-08 NOTE — Progress Notes (Signed)
  PROGRESS NOTE    Marie Hensley  BIP:[id number] DOB: 05/29/1946 DOA: 04/07/2021 PCP: [name], MD   Patient is under Neurosurgery care now, spoke with Dr Zada Finders. Please call if necessary.  [name] Arsenio Loader, MD [facility]  If 7PM-7AM, please contact night-coverage  04/08/2021, 9:29 AM

## 2021-04-08 NOTE — Progress Notes (Signed)
Neurosurgery Service Progress Note  Subjective: Pt admitted overnight with seizures x2, the larger one involved her normal semiology of expressive aphasia and then progressed to some clonic movement of the right face, not into the hand. Speech is subjectively back to baseline per the patient, but she has a little more paraphasic errors than I remember the last time I saw her.   Objective: Vitals:   04/08/21 0400 04/08/21 0500 04/08/21 0600 04/08/21 0641  BP: 111/71 110/70 102/84   Pulse: 79 76 84   Resp:   17   Temp:    98.4 F (36.9 C)  TempSrc:    Oral  SpO2: 93% 92% 95%   Weight:      Height:        Physical Exam: Aox3, PERRL, EOMI, FS & SS, TM, speech fluent with some paraphasic errors, good naming and repetition, strength 5/5x4, SILTx4  Assessment & Plan: 75 y.o. woman w/ LUL mass s/p neg Bx x2 w/ L F mass that presented with seizures, likely metastatic lung cancer s/p preop SRS, now here for resection, interval hx notable for seizures x2 last night but back to baseline.  -she has a good exam to follow, I think the best thing at this point is to take her to the OR and remove the epileptogenic lesion. Discussed with the patient, she agrees, will proceed with OR for resection this morning. Will give her an extra dose of keppra this morning to make a 1g morning dose to help decrease the risk of seizures on emergence.  Joyice Faster Avril Busser  04/08/21 7:09 AM

## 2021-04-08 NOTE — Anesthesia Postprocedure Evaluation (Signed)
Anesthesia Post Note  Patient: Marie Hensley  Procedure(s) Performed: Left Craniotomy for tumor resection with brainlab (Left) APPLICATION OF CRANIAL NAVIGATION     Patient location during evaluation: ICU Anesthesia Type: General Level of consciousness: awake and alert, patient cooperative and oriented Pain management: pain level controlled Vital Signs Assessment: post-procedure vital signs reviewed and stable Respiratory status: nonlabored ventilation, spontaneous breathing, respiratory function stable and patient connected to nasal cannula oxygen Cardiovascular status: blood pressure returned to baseline and stable Postop Assessment: no headache, no apparent nausea or vomiting and adequate PO intake Anesthetic complications: no Comments: Pt having difficulty finding words, but no apparent anesthetic complications   No notable events documented.  Last Vitals:  Vitals:   04/08/21 1700 04/08/21 1800  BP: (!) 90/52   Pulse: (!) 53 (!) 59  Resp: 10 (!) 9  Temp:    SpO2: 99% 95%    Last Pain:  Vitals:   04/08/21 1800  TempSrc:   PainSc: 0-No pain                 Saylee Sherrill,E. Eisha Chatterjee

## 2021-04-08 NOTE — Anesthesia Procedure Notes (Signed)
Procedure Name: Intubation Date/Time: 04/08/2021 7:57 AM Performed by: Donnelly Angelica, RN Pre-anesthesia Checklist: Patient identified, Emergency Drugs available, Suction available and Patient being monitored Patient Re-evaluated:Patient Re-evaluated prior to induction Oxygen Delivery Method: Circle System Utilized Preoxygenation: Pre-oxygenation with 100% oxygen Induction Type: IV induction Ventilation: Mask ventilation without difficulty Laryngoscope Size: Glidescope and 4 Grade View: Grade I Tube type: Oral Tube size: 7.0 mm Number of attempts: 1 Airway Equipment and Method: Stylet and Oral airway Placement Confirmation: ETT inserted through vocal cords under direct vision, positive ETCO2 and breath sounds checked- equal and bilateral Secured at: 20 cm Tube secured with: Tape Dental Injury: Teeth and Oropharynx as per pre-operative assessment

## 2021-04-08 NOTE — Progress Notes (Signed)
Post op note  Pt seen in recovery, reception intact with some speech output but unintelligible, pt aware of deficit, full strength with symmetric face. Likely manipulation from tumor resection, with some output already it should improve with time. No change in plan of care, admit to 4N, will get a post op MRI. If speech function waxes or wanes or she has some new symptoms, will get an EEG.

## 2021-04-08 NOTE — Brief Op Note (Signed)
04/08/2021  9:27 AM  PATIENT:  Marie Hensley  75 y.o. female  PRE-OPERATIVE DIAGNOSIS:  Brain tumor  POST-OPERATIVE DIAGNOSIS:  Brain tumor  PROCEDURE:  Procedure(s) with comments: Left Craniotomy for tumor resection with brainlab (Left) APPLICATION OF CRANIAL NAVIGATION (N/A) - RM 21  SURGEON:  Surgeon(s) and Role:    * Janus Vlcek, Joyice Faster, MD - Primary  PHYSICIAN ASSISTANT:   ASSISTANTS: none   ANESTHESIA:   general  EBL:  150 mL   BLOOD ADMINISTERED:none  DRAINS: none   LOCAL MEDICATIONS USED:  LIDOCAINE   SPECIMEN:  Source of Specimen:  Left frontal brain tumor  DISPOSITION OF SPECIMEN:  PATHOLOGY  COUNTS:  YES  TOURNIQUET:  * No tourniquets in log *  DICTATION: .Note written in EPIC  PLAN OF CARE: Admit to inpatient   PATIENT DISPOSITION:  PACU - hemodynamically stable.   Delay start of Pharmacological VTE agent (>24hrs) due to surgical blood loss or risk of bleeding: yes

## 2021-04-08 NOTE — Op Note (Signed)
PATIENT: Marie Hensley  DAY OF SURGERY: 04/08/21   PRE-OPERATIVE DIAGNOSIS:  Left frontal brain tumor   POST-OPERATIVE DIAGNOSIS:  Same   PROCEDURE:  Left craniotomy for tumor resection, use of frameless stereotaxy   SURGEON:  Surgeon(s) and Role:    Judith Part, MD - Primary   ANESTHESIA: ETGA   BRIEF HISTORY: This is a 75 year old woman who presented with seizures that localized to the left frontal lobe. An enhancing mass was seen in that location, she had a LUL lesion that was biopsied x2 without diagnostic pathology. I therefore recommended surgical resection to treat the seizures and get definitive pathology to guide treatment. This was discussed with the patient as well as risks, benefits, and alternatives and wished to proceed with surgery.   OPERATIVE DETAIL: The patient was taken to the operating room and placed on the OR table in the supine position. A formal time out was performed with two patient identifiers and confirmed the operative site. Anesthesia was induced by the anesthesia team. The [place] head holder was applied to the head and a registration array was attached to the [place]. This was co-registered with the patient's preoperative imaging, the fit appeared to be acceptable. Using frameless stereotaxy, the operative trajectory was planned and the incision was marked. Hair was clipped with surgical clippers over the incision and the area was then prepped and draped in a sterile fashion.  Frameless stereotaxy was used to guide placement of the incision and craniotomy as well as intermittent checks to confirm landmarks, anatomy, and tumor borders. A linear incision was placed in the left frontal region. A craniotomy flap was turned, the dura was opened, and the tumor was identified. It was dissected circumferentially and resected en bloc then sent to pathology for analysis. Hemostasis was confirmed, margins were checked visually and with stereotaxy, the dura was  closed, the bone flap was replaced with titanium plates and screws.  All instrument and sponge counts were correct, the incision was then closed in layers. The patient was then returned to anesthesia for emergence. No apparent complications at the completion of the procedure. Prelim path came back as likely metastatic carcinoma.    EBL:  126mL   DRAINS: none   SPECIMENS: Left frontal brain tumor   Judith Part, MD 04/08/21 7:16 AM

## 2021-04-08 NOTE — Transfer of Care (Signed)
Immediate Anesthesia Transfer of Care Note  Patient: Marie Hensley  Procedure(s) Performed: Left Craniotomy for tumor resection with brainlab (Left) APPLICATION OF CRANIAL NAVIGATION  Patient Location: PACU  Anesthesia Type:General  Level of Consciousness: awake, alert , oriented, patient cooperative and responds to stimulation  Airway & Oxygen Therapy: Patient Spontanous Breathing and Patient connected to face mask oxygen  Post-op Assessment: Report given to RN and Post -op Vital signs reviewed and stable  Post vital signs: Reviewed and stable  Last Vitals:  Vitals Value Taken Time  BP 112/79 04/08/21 0946  Temp    Pulse 76 04/08/21 0952  Resp 9 04/08/21 0952  SpO2 95 % 04/08/21 0952  Vitals shown include unvalidated device data.  Last Pain:  Vitals:   04/08/21 0641  TempSrc: Oral  PainSc:          Complications: No notable events documented.

## 2021-04-08 NOTE — Evaluation (Signed)
Physical Therapy Evaluation Patient Details Name: Marie Hensley MRN: [id number] DOB: 1945-11-08 Today's Date: 04/08/2021   History of Present Illness  Pt is a 75 y.o. female who presented 04/07/21 s/p seizure with aphasia postictally which is normal for pt, per chart. Pt with L hemispheric brain mets from lung cancer. MRI 7/28 revealed Slight interval increase in size of left middle frontal convexity metastasis as compared to MRI from 04/01/2021. S/p L crani and tumor resection 7/28. PMH: arthritis, depression, dyspnea, HTN, brain metastasis from lung cancer, history of seizures, on Keppra 500 twice daily, s/p stereotactic radiosurgery for brain metastasis 04/06/21.   Clinical Impression  Pt presents with condition above and deficits mentioned below, see PT Problem List. Pt with expressive deficits, answering questions with yes/no answers. PTA, she was independent and lived alone in a 1-level house with a ramped entrance but has 24/7 assistance available as needed from family. Currently, pt displays WFL and symmetrical bil lower extremity strength and coordination with testing. Initially, she had a mild trunk sway when standing and ambulating without UE support with min guard assist, but otherwise fairly safe gait. However, as distance progressed her balance declined and she reported she was dizzy and nauseated. Pt returned to room for safety purposes with minA. She would benefit from taking her orthostatics to ensure her BP is not decreasing with changes in position. Expect pt will quickly return to her baseline functionally, thus will follow acutely but no PT follow-up deemed necessary at this time.     Follow Up Recommendations No PT follow up    Equipment Recommendations  None recommended by PT    Recommendations for Other Services       Precautions / Restrictions Precautions Precautions: Fall;Other (comment) Precaution Comments: seizures Restrictions Weight Bearing Restrictions: No       Mobility  Bed Mobility Overal bed mobility: Modified Independent             General bed mobility comments: Pt able to transition supine <> sit EOB and long sitting in bed with HOB elevated and use of rails intermittently.    Transfers Overall transfer level: Needs assistance Equipment used: None Transfers: Sit to/from Stand Sit to Stand: Min guard         General transfer comment: Mild trunk sway but no LOB, min guard assist for safety.  Ambulation/Gait Ambulation/Gait assistance: Min guard;Min assist Gait Distance (Feet): 60 Feet Assistive device: None Gait Pattern/deviations: Step-through pattern;Wide base of support;Staggering right;Staggering left;Decreased stride length Gait velocity: reduced Gait velocity interpretation: 1.31 - 2.62 ft/sec, indicative of limited community ambulator General Gait Details: Initially, pt with fairly symmetric, step-through smooth gait with no LOB, mild trunk sway but no LOB, min guard assist. However, as distance progressed she began to sway more and reach out for objects, asked if she was feeling dizzy with her reporting yes, thus returned pt to room to sit to ensure safety, minA.  Stairs            Wheelchair Mobility    Modified Rankin (Stroke Patients Only) Modified Rankin (Stroke Patients Only) Pre-Morbid Rankin Score: No symptoms Modified Rankin: Moderately severe disability     Balance Overall balance assessment: Mild deficits observed, not formally tested                                           Pertinent Vitals/Pain Pain Assessment: No/denies pain  Home Living Family/patient expects to be discharged to:: Private residence Living Arrangements: Alone Available Help at Discharge: Family;Available 24 hours/day Type of Home: House Home Access: Ramped entrance     Home Layout: One level Home Equipment: Grab bars - tub/shower;Shower seat Additional Comments: Communicated home info  indicating yes/no from provided list from therapist.    Prior Function Level of Independence: Independent         Comments: Pt reports being independent with mobility, financial and medication management, and ADLs and driving.     Hand Dominance   Dominant Hand: Right    Extremity/Trunk Assessment   Upper Extremity Assessment Upper Extremity Assessment: Defer to OT evaluation    Lower Extremity Assessment Lower Extremity Assessment: Overall WFL for tasks assessed (bil strength 4+ to 5 grossly with MMT; intact coordination bil)    Cervical / Trunk Assessment Cervical / Trunk Assessment: Normal  Communication   Communication: Expressive difficulties  Cognition Arousal/Alertness: Awake/alert Behavior During Therapy: WFL for tasks assessed/performed;Agitated (increasingly agitated and crying as she was unable to express herself despite multiple attempts vebally and through writing) Overall Cognitive Status: Difficult to assess                                 General Comments: Difficult to assess cognitive status due to expressive deficits, but appears to be able to sequence and problem-solve how to arrange lines for mobility. Pt can be a bit quick to move things though and need cues to pause for PT to assist. Pt became emotional in regards to difficulty communicating.      General Comments General comments (skin integrity, edema, etc.): SpO2 >/= 88% throughout on RA, majority of time >/= 92%, re-donned [place] at 2 L end of session; unsure if BP dropped as did not take orthostatics but BP was increased with SBP in 140s sitting after ambulating compared to start of session, pt dizzy and nauseated with walking    Exercises     Assessment/Plan    PT Assessment Patient needs continued PT services  PT Problem List Decreased activity tolerance;Decreased balance;Decreased mobility       PT Treatment Interventions DME instruction;Gait training;Functional mobility  training;Therapeutic activities;Therapeutic exercise;Balance training;Neuromuscular re-education;Cognitive remediation;Patient/family education    PT Goals (Current goals can be found in the Care Plan section)  Acute Rehab PT Goals Patient Stated Goal: to communicate better PT Goal Formulation: With patient Time For Goal Achievement: 04/22/21 Potential to Achieve Goals: Good    Frequency Min 3X/week   Barriers to discharge        Co-evaluation               AM-PAC PT "6 Clicks" Mobility  Outcome Measure Help needed turning from your back to your side while in a flat bed without using bedrails?: None Help needed moving from lying on your back to sitting on the side of a flat bed without using bedrails?: None Help needed moving to and from a bed to a chair (including a wheelchair)?: A Little Help needed standing up from a chair using your arms (e.g., wheelchair or bedside chair)?: A Little Help needed to walk in hospital room?: A Little Help needed climbing 3-5 steps with a railing? : A Little 6 Click Score: 20    End of Session Equipment Utilized During Treatment: Gait belt;Oxygen Activity Tolerance: Other (comment) (limited by dizziness/nausea) Patient left: in bed;with call bell/phone within reach;with bed alarm set  Nurse Communication: Mobility status;Other (comment) (dizziness/nausea and BP) PT Visit Diagnosis: Unsteadiness on feet (R26.81);Other abnormalities of gait and mobility (R26.89);Difficulty in walking, not elsewhere classified (R26.2);Other symptoms and signs involving the nervous system (R29.898);Dizziness and giddiness (R42)    Time: 1710-1748 PT Time Calculation (min) (ACUTE ONLY): 38 min   Charges:   PT Evaluation $PT Eval Moderate Complexity: 1 Mod PT Treatments $Therapeutic Activity: 23-37 mins        Moishe Spice, PT, DPT Acute Rehabilitation Services  Pager: [phone] Office: [phone]   Orvan Falconer 04/08/2021, 6:21 PM

## 2021-04-08 NOTE — Anesthesia Procedure Notes (Signed)
Arterial Line Insertion Start/End7/28/2022 7:05 AM, 04/08/2021 7:10 AM Performed by: Annye Asa, MD, Antoneo Ghrist Melanee Left, CRNA  Preanesthetic checklist: patient identified, IV checked, site marked, risks and benefits discussed, surgical consent, monitors and equipment checked, pre-op evaluation, timeout performed and anesthesia consent Lidocaine 1% used for infiltration Right, radial was placed Catheter size: 20 G Hand hygiene performed  and maximum sterile barriers used   Attempts: 2 Procedure performed without using ultrasound guided technique. Following insertion, dressing applied and Biopatch. Post procedure assessment: normal  Patient tolerated the procedure well with no immediate complications.

## 2021-04-08 NOTE — Progress Notes (Signed)
                                                                                                                                                                Patient Name: Marie Hensley MRN: [id number] DOB: 1946/02/24 Referring Physician: Waunita Schooner Date of Service: 04/06/2021 [facility], [place]                                                        End Of Treatment Note  Diagnoses: C79.31-Secondary malignant neoplasm of brain  Cancer Staging: 75 y.o. female with a 1.5 cm mass in the left frontal lobe, likely a metastasis from primary lung cancer  Intent: Palliative  Radiation Treatment Dates: 04/06/2021 through 04/06/2021 Site Technique Total Dose (Gy) Dose per Fx (Gy) Completed Fx Beam Energies  Brain: Brain_SRS PTV_1 Lt Frontal 69mm IMRT 20/20 20 1/1 [id number]   Narrative: The patient was treated by Dr. Tammi Klippel. She tolerated radiation therapy relatively well.   Plan: The patient will receive a call in about one month from the radiation oncology department. She will continue follow up with Dr. Mickeal Skinner as well.   ________________________________________________    Carola Rhine, [facility]

## 2021-04-09 ENCOUNTER — Encounter (HOSPITAL_COMMUNITY): Payer: Self-pay | Admitting: Neurological Surgery

## 2021-04-09 ENCOUNTER — Inpatient Hospital Stay (HOSPITAL_COMMUNITY): Payer: Medicare HMO

## 2021-04-09 LAB — BASIC METABOLIC PANEL
Anion gap: 10 (ref 5–15)
BUN: 27 mg/dL — ABNORMAL HIGH (ref 8–23)
CO2: 24 mmol/L (ref 22–32)
Calcium: 9.1 mg/dL (ref 8.9–10.3)
Chloride: 102 mmol/L (ref 98–111)
Creatinine, Ser: 1.23 mg/dL — ABNORMAL HIGH (ref 0.44–1.00)
GFR, Estimated: 46 mL/min — ABNORMAL LOW (ref >60)
Glucose, Bld: 125 mg/dL — ABNORMAL HIGH (ref 70–99)
Potassium: 4.2 mmol/L (ref 3.5–5.1)
Sodium: 136 mmol/L (ref 135–145)

## 2021-04-09 LAB — CBC
HCT: 32.8 % — ABNORMAL LOW (ref 36.0–46.0)
Hemoglobin: 10.5 g/dL — ABNORMAL LOW (ref 12.0–15.0)
MCH: 31.7 pg (ref 26.0–34.0)
MCHC: 32 g/dL (ref 30.0–36.0)
MCV: 99.1 fL (ref 80.0–100.0)
Platelets: 168 10*3/uL (ref 150–400)
RBC: 3.31 MIL/uL — ABNORMAL LOW (ref 3.87–5.11)
RDW: 13.3 % (ref 11.5–15.5)
WBC: 13.9 10*3/uL — ABNORMAL HIGH (ref 4.0–10.5)
nRBC: 0 % (ref 0.0–0.2)

## 2021-04-09 LAB — GLUCOSE, CAPILLARY
Glucose-Capillary: 104 mg/dL — ABNORMAL HIGH (ref 70–99)
Glucose-Capillary: 116 mg/dL — ABNORMAL HIGH (ref 70–99)
Glucose-Capillary: 136 mg/dL — ABNORMAL HIGH (ref 70–99)
Glucose-Capillary: 133 mg/dL — ABNORMAL HIGH (ref 70–99)
Glucose-Capillary: 185 mg/dL — ABNORMAL HIGH (ref 70–99)
Glucose-Capillary: 140 mg/dL — ABNORMAL HIGH (ref 70–99)

## 2021-04-09 LAB — HEMOGLOBIN A1C
Hgb A1c MFr Bld: 6.4 % — ABNORMAL HIGH (ref 4.8–5.6)
Mean Plasma Glucose: 137 mg/dL

## 2021-04-09 IMAGING — MR MR HEAD WO/W CM
14 of 16 series · 40 of 48 positions shown · IV contrast (Gadavist)
Comparison: Prior MRI from [DATE].

CLINICAL DATA: Follow-up examination status post craniotomy for
tumor resection.

EXAM:
MRI HEAD WITHOUT AND WITH CONTRAST
TECHNIQUE: Multiplanar, multiecho pulse sequences of the brain and surrounding
structures were obtained without and with intravenous contrast.
CONTRAST:  7.5mL GADAVIST GADOBUTROL 1 MMOL/ML IV SOLN

[Series 5: DWI · axial · 3.0mm · 0.88mm/px · z∈[-120,+29]mm · 6 of 102 slices shown (1 of 4)]
[im 1/102]
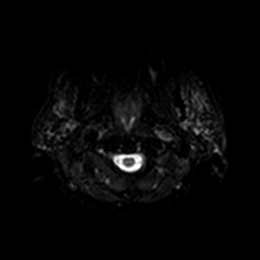
[im 21/102]
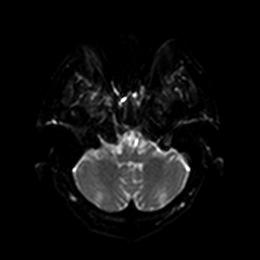
[im 41/102]
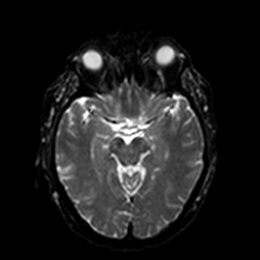
[im 61/102]
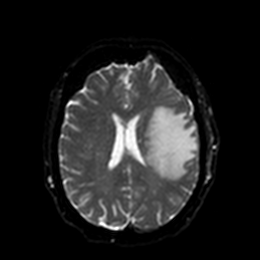
[im 81/102]
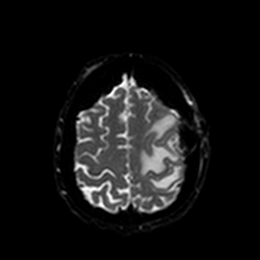
[im 102/102]
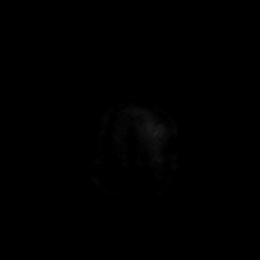

[Series 6: DWI · axial · 3.0mm · 0.88mm/px · z∈[-120,+29]mm · 2 of 51 slices shown (2 of 4)]
[im 1/51]
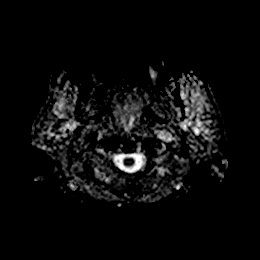
[im 51/51]
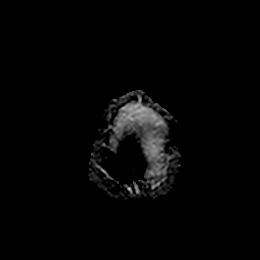

[Series 7: DWI · coronal · 4.0mm · 0.88mm/px · 4 of 72 slices shown (3 of 4)]
[im 1/72]
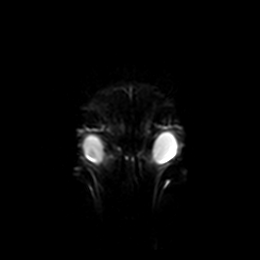
[im 24/72]
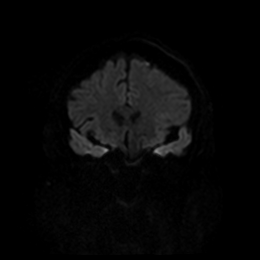
[im 48/72]
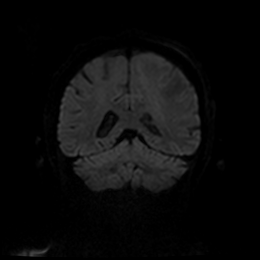
[im 72/72]
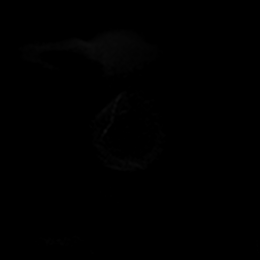

[Series 8: DWI · coronal · 4.0mm · 0.88mm/px · 1 of 36 slices shown (4 of 4)]
[im 1/36]
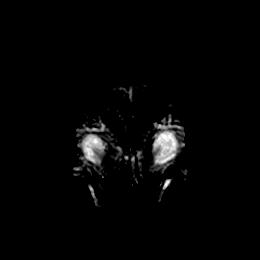

[Series 9: T1 · sagittal · 5.0mm · 0.75mm/px · 2 of 25 slices shown]
[im 1/25]
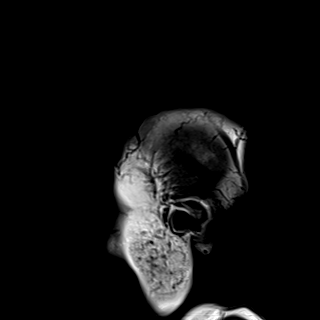
[im 25/25]
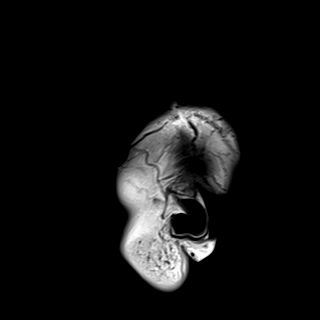

[Series 10: T2 · axial · 5.0mm · 0.72mm/px · z∈[-123,+32]mm · 2 of 27 slices shown]
[im 1/27]
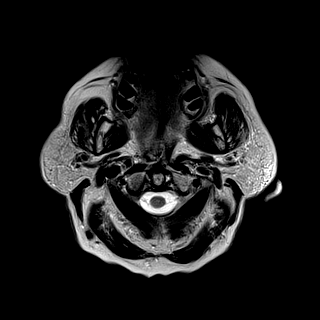
[im 27/27]
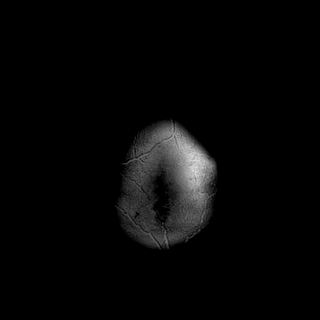

[Series 11: FLAIR · axial · 5.0mm · 0.45mm/px · z∈[-122,+33]mm · 2 of 27 slices shown]
[im 1/27]
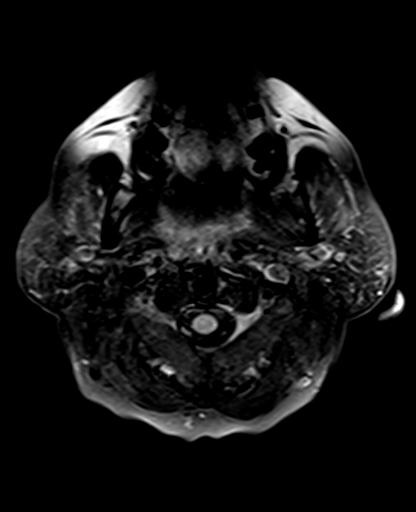
[im 27/27]
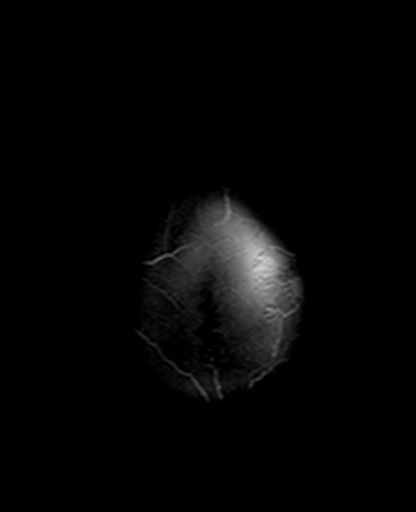

[Series 12: mag_images · axial · 3.0mm · 0.90mm/px · z∈[-127,+37]mm · 4 of 56 slices shown]
[im 1/56]
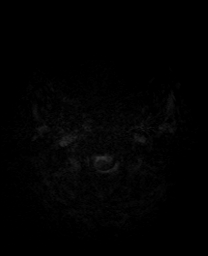
[im 19/56]
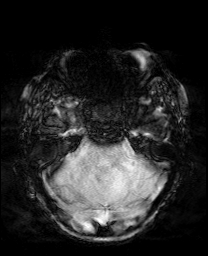
[im 37/56]
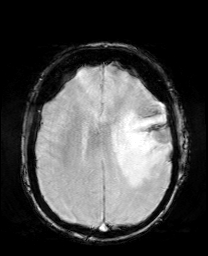
[im 56/56]
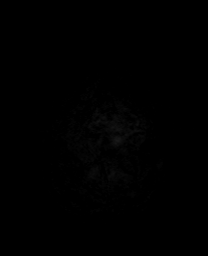

[Series 13: pha_images · axial · 3.0mm · 0.90mm/px · z∈[-124,+37]mm · 4 of 55 slices shown]
[im 1/55]
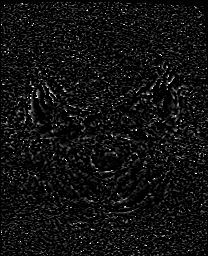
[im 19/55]
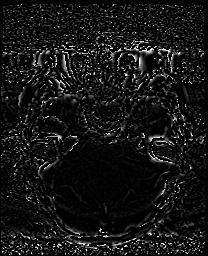
[im 37/55]
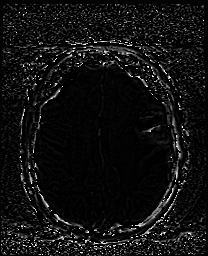
[im 55/55]
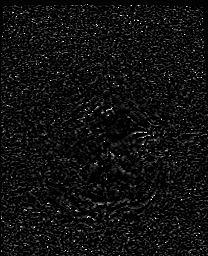

[Series 14: swi_images · axial · 3.0mm · 0.90mm/px · z∈[-127,+37]mm · 4 of 56 slices shown]
[im 1/56]
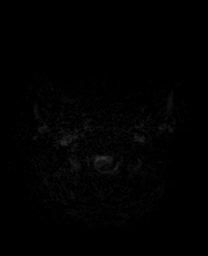
[im 19/56]
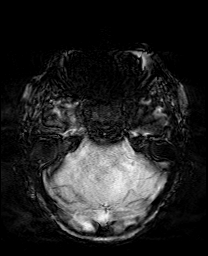
[im 37/56]
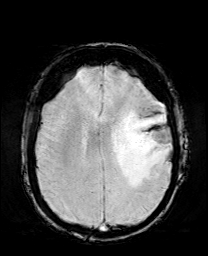
[im 56/56]
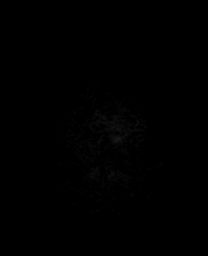

[Series 15: mip_images(sw) · axial · 24.0mm · 0.90mm/px · z∈[-116,+27]mm · 3 of 49 slices shown]
[im 1/49]
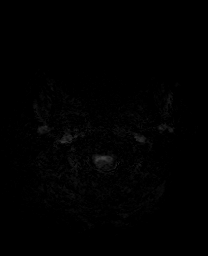
[im 25/49]
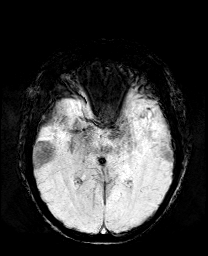
[im 49/49]
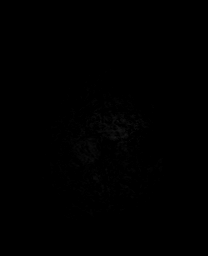

[Series 17: T2 post-contrast · coronal · 5.0mm · 0.72mm/px · 2 of 28 slices shown]
[im 1/28]
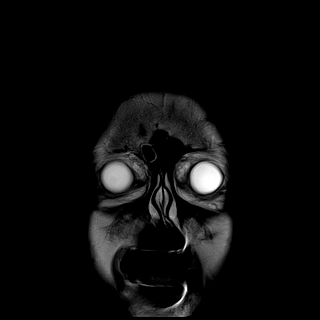
[im 28/28]
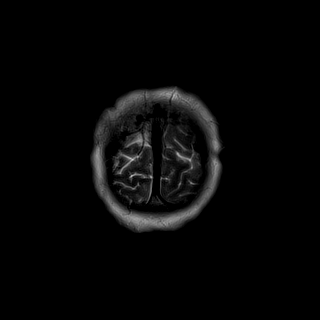

[Series 19: T1 post-contrast · coronal · 5.0mm · 0.34mm/px · 2 of 28 slices shown (1 of 2)]
[im 1/28]
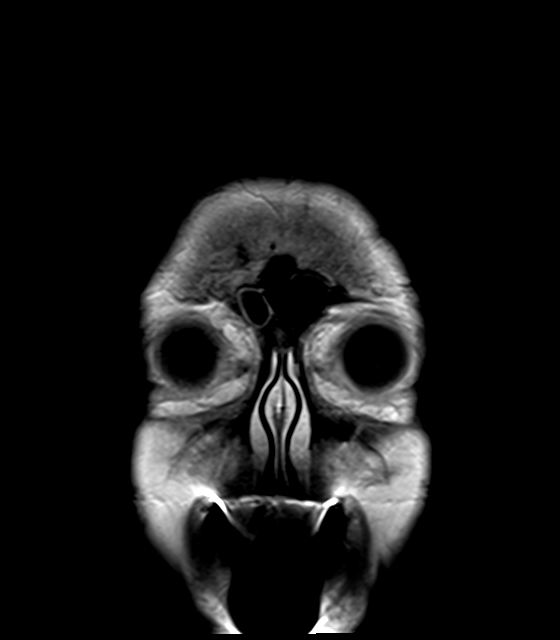
[im 28/28]
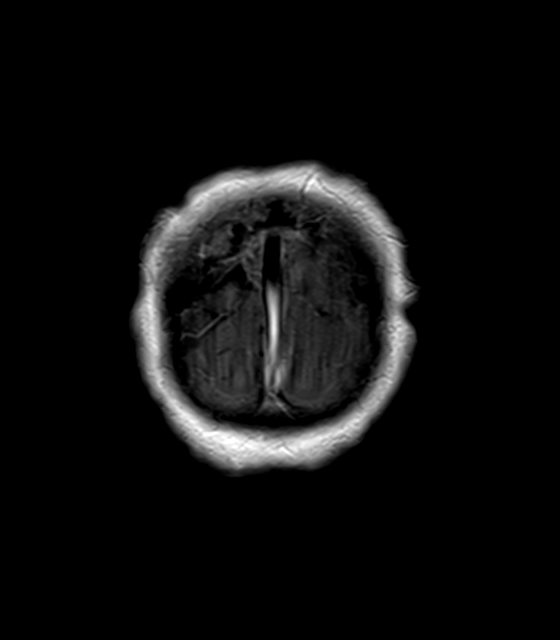

[Series 20: T1 post-contrast · sagittal · 5.0mm · 0.72mm/px · 2 of 25 slices shown (2 of 2)]
[im 1/25]
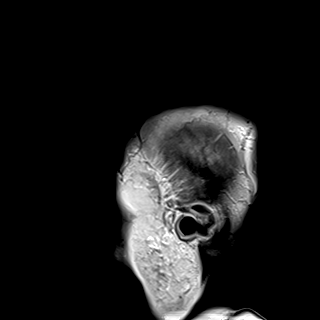
[im 25/25]
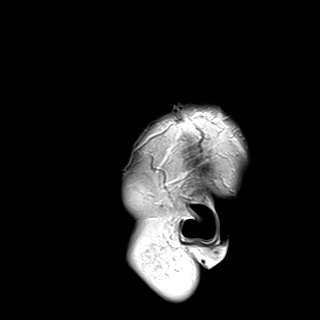

[40 of 48 positions shown; findings below may reference images not displayed]

FINDINGS: Brain: Postoperative changes from interval left frontal craniotomy
for resection of previously identified left frontal convexity tumor.
Small extra-axial collection overlies the left cerebral convexity,
measuring up to 1-2 mm in maximal thickness without significant mass
effect. Scattered postoperative blood products with a probable few
small foci of pneumocephalus noted about the resection cavity.
Previously seen tumor has been resected. Small amount of smudgy
enhancement along the inferior aspect of the resection cavity noted
(series 19, image 15). Area of enhancement measures approximately 8
mm in size. Finding is indeterminate, and could reflect
postoperative changes, although a small amount of residual tumor is
not excluded. Dural thickening and enhancement overlying the left
cerebral convexity felt to be most consistent with normal expected
postoperative change. Triangular focus of restricted diffusion seen
extending from the resection cavity into the underlying left frontal
centrum semi ovale, consistent with a small peri resection infarct
(series 5, image 86). Vasogenic edema within the adjacent left
cerebral hemispheres relatively similar to preoperative exam as is
mild 4 mm of left-to-right shift.

Remainder the brain is otherwise stable in appearance with no new
finding or other abnormality identified.

Vascular: Major intracranial vascular flow voids are maintained.

Skull and upper cervical spine: Craniocervical junction within
normal limits. Bone marrow signal intensity normal. Post craniotomy
changes present at the left frontal calvarium and scalp.

Sinuses/Orbits: Globes and orbital soft tissues demonstrate no acute
finding. Paranasal sinuses remain largely clear. No mastoid
effusion.

Other: None.
IMPRESSION: 1. Postoperative changes from interval left frontal craniotomy for
tumor resection. 8 mm focus of enhancement along the inferior aspect
of the resection cavity, indeterminate, and could be postoperative,
although a small amount of residual tumor is not excluded. Attention
at follow-up recommended.
2. Triangular focus of restricted diffusion extending from the
resection cavity into the underlying left frontal centrum semi
ovale, consistent with a small peri resection infarct.
3. Similar vasogenic edema within the adjacent left cerebral
hemispheres with associated 4 mm of left-to-right shift.

## 2021-04-09 MED ORDER — DEXAMETHASONE 4 MG PO TABS
4.0000 mg | ORAL_TABLET | Freq: Four times a day (QID) | ORAL | Status: AC
  Administered 2021-04-09 – 2021-04-10 (×4): 4 mg via ORAL
  Filled 2021-04-09 (×4): qty 1

## 2021-04-09 MED ORDER — GADOBUTROL 1 MMOL/ML IV SOLN
7.5000 mL | Freq: Once | INTRAVENOUS | Status: AC | PRN
  Administered 2021-04-09: 7.5 mL via INTRAVENOUS

## 2021-04-09 NOTE — Progress Notes (Signed)
Physical Therapy Treatment Patient Details Name: Marie Hensley MRN: [id number] DOB: 10-02-1945 Today's Date: 04/09/2021    History of Present Illness Pt is a 75 y.o. female who presented 04/07/21 s/p seizure with aphasia postictally which is normal for pt, per chart. Pt with L hemispheric brain mets from lung cancer. MRI 7/28 revealed Slight interval increase in size of left middle frontal convexity metastasis as compared to MRI from 04/01/2021. S/p L crani and tumor resection 7/28. PMH: arthritis, depression, dyspnea, HTN, brain metastasis from lung cancer, history of seizures, on Keppra 500 twice daily, s/p stereotactic radiosurgery for brain metastasis 04/06/21.    PT Comments    Communicated with pt's daughter prior to session, who confirmed PLOF and home set-up, except pt has a walk-in shower with a built in seat and separate tub. Daughter reported she can stay with pt 24/7 at this time. Daughter reported pt's expressive deficits following seizures normally resolved itself after ~1-2 hrs and was not to the extent where she could not call the daughter to communicate that she had a seizure. Daughter also reported the pt's R hand coordination deficits began ~2-3 weeks prior. Pt displays dysmetria and gross weakness throughout her R UE. Also, pt displayed decreased R foot clearance and step length when ambulating this date. Pt needed continual reminders to correct these gait deviations to ensure her safety as it places her at risk for tripping/falls. Thus, changed d/c recs to Outpatient PT, preferably at a neuro clinic. Will continue to follow acutely.    Follow Up Recommendations  Outpatient PT;Supervision/Assistance - 24 hour (neuro clinic)     Equipment Recommendations  None recommended by PT    Recommendations for Other Services       Precautions / Restrictions Precautions Precautions: Fall;Other (comment) Precaution Comments: seizures Restrictions Weight Bearing Restrictions: No     Mobility  Bed Mobility Overal bed mobility: Modified Independent             General bed mobility comments: Pt able to transition supine <> sit EOB with HOB elevated and use of rails intermittently.    Transfers Overall transfer level: Needs assistance Equipment used: None Transfers: Sit to/from Stand Sit to Stand: Min guard         General transfer comment: Mild trunk sway but no LOB, min guard assist for safety.  Ambulation/Gait Ambulation/Gait assistance: Min guard Gait Distance (Feet): 130 Feet Assistive device: None Gait Pattern/deviations: Step-through pattern;Decreased stride length;Decreased step length - right;Decreased dorsiflexion - right Gait velocity: reduced Gait velocity interpretation: 1.31 - 2.62 ft/sec, indicative of limited community ambulator General Gait Details: Pt with noted R foot dragging this date, needing verbal and visual cues to improve foot clearance and step length, momentary success. Increased drag as pt fatigued. No LOB, min guard assist for safety.   Stairs             Wheelchair Mobility    Modified Rankin (Stroke Patients Only) Modified Rankin (Stroke Patients Only) Pre-Morbid Rankin Score: No symptoms Modified Rankin: Moderately severe disability     Balance Overall balance assessment: Mild deficits observed, not formally tested                                          Cognition Arousal/Alertness: Awake/alert Behavior During Therapy: WFL for tasks assessed/performed;Agitated Overall Cognitive Status: Difficult to assess  General Comments: Pt presents with a delay in processing.  She follows one step commands consistently.      Exercises General Exercises - Lower Extremity Hip Flexion/Marching: AROM;Right;10 reps;Seated Toe Raises: Right;10 reps;Seated;AROM    General Comments General comments (skin integrity, edema, etc.): Noted R UE fine and  gross coordination deficits with dysmetria. R UE grossly 4/5 with MMT compared to 4+ to 5 on L;educated pt to walk with nursing and perform SLR and ankle pumps as HEP; BP supine 109/64, sitting 96/64, standing 111/71 with pt denying dizziness throughout session      Pertinent Vitals/Pain Pain Assessment: 0-10 Pain Score: 5  Pain Location: head Pain Descriptors / Indicators: Headache;Operative site guarding Pain Intervention(s): Limited activity within patient's tolerance;Monitored during session;Repositioned    Home Living                      Prior Function            PT Goals (current goals can now be found in the care plan section) Acute Rehab PT Goals Patient Stated Goal: to improve PT Goal Formulation: With patient Time For Goal Achievement: 04/22/21 Potential to Achieve Goals: Good Progress towards PT goals: Progressing toward goals    Frequency    Min 3X/week      PT Plan Discharge plan needs to be updated    Co-evaluation              AM-PAC PT "6 Clicks" Mobility   Outcome Measure  Help needed turning from your back to your side while in a flat bed without using bedrails?: None Help needed moving from lying on your back to sitting on the side of a flat bed without using bedrails?: None Help needed moving to and from a bed to a chair (including a wheelchair)?: A Little Help needed standing up from a chair using your arms (e.g., wheelchair or bedside chair)?: A Little Help needed to walk in hospital room?: A Little Help needed climbing 3-5 steps with a railing? : A Little 6 Click Score: 20    End of Session Equipment Utilized During Treatment: Gait belt Activity Tolerance: Patient limited by fatigue Patient left: in bed;with call bell/phone within reach;with bed alarm set Nurse Communication: Mobility status PT Visit Diagnosis: Unsteadiness on feet (R26.81);Other abnormalities of gait and mobility (R26.89);Difficulty in walking, not elsewhere  classified (R26.2);Other symptoms and signs involving the nervous system (R29.898);Dizziness and giddiness (R42)     Time: 1610-9604 PT Time Calculation (min) (ACUTE ONLY): 19 min  Charges:  $Gait Training: 8-22 mins                     Moishe Spice, PT, DPT Acute Rehabilitation Services  Pager: [phone] Office: [place] 04/09/2021, 6:39 PM

## 2021-04-09 NOTE — Progress Notes (Addendum)
  Radiation Oncology         [phone]) [phone] ________________________________  Name: Marie Hensley MRN: [id number]  Date: 04/07/2021  DOB: June 11, 1946  Chart Note:  I reviewed this patient's most recent findings and wanted to take a minute to document my impression.  Marie Hensley is a 75 y.o. woman who presented to her PCP, Dr. Waunita Schooner in 11/2020 with shortness of breath on exertion. Work up showed a concerning LUL nodule. She was referred to cardiology and subsequently underwent PET scan on 12/22/20 showing hypermetabolism to the LUL nodule, as well as three hypermetabolic left hilar lymph nodes. Also noted was focal activity in the colon. She proceeded to bronchoscopy eiyh Dr. [name] on 12/23/20. Unfortunately, the pathology was nondiagnostic. She underwent repeat bronchoscopy on 02/01/21, and all pathology and cytology was benign.  The patient was offered and encouraged to undergo CT Biopsy versus repeat bronchoscopy, but, the patient refused.  She presented to the ED on 03/04/21 with sudden onset expressive aphasia. Non-contrast head CT showed a 5.6 cm region of vasogenic edema within the mid to posterior left frontal lobe. Further evaluation with brain MRI showed a 1.5 cm enhancing lesion along the mid left frontal lobe convexity with moderate surrounding vasogenic edema, regional mass effect, and trace 1-2 mm rightward midline shift. She was started on Keppra $RemoveB'500mg'YgkHrLTb$  po BID in the ED.   Her case was discussed in our multidisciplinary brain tumor board, and she met with Dr. Mickeal Skinner on 03/09/21. The consensus recommendation is for preoperative SRS followed by excisional craniotomy. She developed some difficulty with right upper extremity coordination and strength on Friday 03/26/21 and was started on dexamethasone on 03/26/21 by Dr. Zada Finders. She reports much improvement in these symptoms since starting the steroids. She is scheduled for a treatment planning 3T brain MRI on 04/01/21 prior to her  stereotactic radiosurgery on 04/06/21 to followed by craniotomy on 04/08/21 under the care of Dr. Zada Finders.  Pathology shows adenocarcinoma, which supports a diagnosis:  cT1c cN1 pM1b - Stage IVA Non-small cell cancer of the left upper lung.    She is recovering from craniotomy with likely discharge home soon.  I will forward information to Norton Blizzard and Dr. Julien Nordmann for presentation in the Multidisciplinary Thoracic Oncology Clinic [facility]) for consideration of re-staging, molecular testing and definitive curative treatment of the chest.  ________________________________  Sheral Apley. Tammi Klippel, M.D.

## 2021-04-09 NOTE — Progress Notes (Signed)
Initial Nutrition Assessment  DOCUMENTATION CODES:   Not applicable  INTERVENTION:   Magic cup TID with meals, each supplement provides 290 kcal and 9 grams of protein  Discussed importance of adequate nutrition for cancer treatment   NUTRITION DIAGNOSIS:   Inadequate oral intake related to decreased appetite as evidenced by per patient/family report.  GOAL:   Patient will meet greater than or equal to 90% of their needs  MONITOR:   PO intake, Supplement acceptance  REASON FOR ASSESSMENT:   Malnutrition Screening Tool    ASSESSMENT:   Pt with PMH of lung cancer with mets to the brain, seizure disorder due to this, s/p radiosurgery for brain metastasis on 7/26. Pt admitted for breakthrough seizure.   Pt discussed during ICU rounds and with RN.   7/28 s/p L craniotomy for tumor resection   Pt with expressive aphasia but understands and able to shake yes/no. Daughter at bedside and provides additional info as needed. Per pt she has a good appetite until about a week or 2 ago. She has lost a few pounds due to this.  Per documentation review pt with 8 lb weight loss x 1 month. (4%) Pt does not drink boost/ensure Pt willing to try magic cups until appetite is better.    Diet Order:   Diet Order             Diet regular Room service appropriate? Yes; Fluid consistency: Thin  Diet effective now                   EDUCATION NEEDS:   Education needs have been addressed  Skin:  Skin Assessment: Reviewed RN Assessment  Last BM:  unknown  Height:   Ht Readings from Last 1 Encounters:  04/07/21 5\' 4"  (1.626 m)    Weight:   Wt Readings from Last 1 Encounters:  04/07/21 72.6 kg     BMI:  Body mass index is 27.46 kg/m.  Estimated Nutritional Needs:   Kcal:  1700-1900  Protein:  85-100 grams  Fluid:  >1.7 L/day  Lockie Pares., RD, LDN, CNSC See AMiON for contact information

## 2021-04-09 NOTE — Evaluation (Signed)
Occupational Therapy Evaluation Patient Details Name: Marie Hensley MRN: [id number] DOB: Mar 30, 1946 Today's Date: 04/09/2021    History of Present Illness Pt is a 75 y.o. female who presented 04/07/21 s/p seizure with aphasia postictally which is normal for pt post seizure, per chart review). Pt with L hemispheric brain mets from lung cancer. MRI 7/28 revealed Slight interval increase in size of left middle frontal convexity metastasis as compared to MRI from 04/01/2021. S/p L crani and tumor resection 7/28. PMH: arthritis, depression, dyspnea, HTN, brain metastasis from lung cancer, history of seizures, on Keppra 500 twice daily, s/p stereotactic radiosurgery for brain metastasis 04/06/21.   Clinical Impression   Pt admitted with above. She demonstrates the below listed deficits and will benefit from continued OT to maximize safety and independence with BADLs.  Pt presents to OT with generalized weakness, impaired balance, decreased activity tolerance, and increased pain as well as communication deficits.  Eval this date was limited by pain.  She currently requires min A, overall for ADLs and min guard assist for functional transfers.  She indicates she lives alone and was mod I with ADLs.  Will follow acutely and recommend 24 hour assist/supervision at discharge.      Follow Up Recommendations  Supervision/Assistance - 24 hour;Outpatient OT    Equipment Recommendations  None recommended by OT    Recommendations for Other Services       Precautions / Restrictions Precautions Precautions: Fall Precaution Comments: seizures Restrictions Weight Bearing Restrictions: No      Mobility Bed Mobility Overal bed mobility: Modified Independent             General bed mobility comments: moves slowly and is guarded due to pain    Transfers Overall transfer level: Needs assistance Equipment used: None Transfers: Sit to/from Stand;Stand Pivot Transfers Sit to Stand: Min  guard Stand pivot transfers: Min assist       General transfer comment: min guard for safety    Balance Overall balance assessment: Mild deficits observed, not formally tested                                         ADL either performed or assessed with clinical judgement   ADL Overall ADL's : Needs assistance/impaired Eating/Feeding: Modified independent;Sitting   Grooming: Wash/dry hands;Set up;Sitting Grooming Details (indicate cue type and reason): pt unable to tolerate grooming at sink due to head pain Upper Body Bathing: Minimal assistance;Sitting   Lower Body Bathing: Minimal assistance;Sit to/from stand   Upper Body Dressing : Minimal assistance;Sitting   Lower Body Dressing: Minimal assistance;Sit to/from stand Lower Body Dressing Details (indicate cue type and reason): able to pull socks up independently Toilet Transfer: Min guard;Stand-pivot;BSC   Toileting- Clothing Manipulation and Hygiene: Minimal assistance;Sit to/from stand       Functional mobility during ADLs: Min guard General ADL Comments: pt limited by pain     Vision Baseline Vision/History: Wears glasses Wears Glasses: At all times Additional Comments: did not assess due to HA pain     Perception Perception Perception Tested?: Yes Comments: grossly assessed.  No obvious deficits   Praxis Praxis Praxis tested?: Within functional limits    Pertinent Vitals/Pain Pain Assessment: Faces Faces Pain Scale: Hurts even more Pain Location: head Pain Descriptors / Indicators: Headache;Operative site guarding Pain Intervention(s): Monitored during session;Limited activity within patient's tolerance;Repositioned     Hand Dominance Right  Extremity/Trunk Assessment Upper Extremity Assessment Upper Extremity Assessment: Generalized weakness (limited assessment due to headache pain)   Lower Extremity Assessment Lower Extremity Assessment: Defer to PT evaluation   Cervical /  Trunk Assessment Cervical / Trunk Assessment: Normal   Communication Communication Communication: Expressive difficulties   Cognition Arousal/Alertness: Awake/alert Behavior During Therapy: WFL for tasks assessed/performed;Agitated Overall Cognitive Status: Difficult to assess                                 General Comments: Pt presents with a delay in processing.  She follows one step commands consistently.   no family present   General Comments  VSs    Exercises     Shoulder Instructions      Home Living Family/patient expects to be discharged to:: Private residence Living Arrangements: Alone Available Help at Discharge: Family;Available 24 hours/day Type of Home: House Home Access: Ramped entrance     Home Layout: One level     Bathroom Shower/Tub: Teacher, early years/pre: Handicapped height     Home Equipment: Grab bars - tub/shower;Shower seat          Prior Functioning/Environment Level of Independence: Independent        Comments: Pt reports being independent with mobility, financial and medication management, and ADLs and driving.        OT Problem List: Impaired balance (sitting and/or standing);Decreased activity tolerance;Decreased cognition;Decreased safety awareness;Pain      OT Treatment/Interventions: Self-care/ADL training;DME and/or AE instruction;Therapeutic activities;Cognitive remediation/compensation;Visual/perceptual remediation/compensation;Patient/family education;Balance training;Neuromuscular education    OT Goals(Current goals can be found in the care plan section) Acute Rehab OT Goals Patient Stated Goal: did not state OT Goal Formulation: With patient Time For Goal Achievement: 04/23/21 Potential to Achieve Goals: Good ADL Goals Pt Will Perform Grooming: with supervision;standing Pt Will Perform Upper Body Bathing: with set-up;sitting Pt Will Perform Lower Body Bathing: with supervision;sit to/from  stand Pt Will Perform Upper Body Dressing: with set-up;sitting Pt Will Perform Lower Body Dressing: with supervision;sit to/from stand Pt Will Transfer to Toilet: with supervision;ambulating;bedside commode;grab bars;regular height toilet Pt Will Perform Toileting - Clothing Manipulation and hygiene: with supervision;sit to/from stand Pt Will Perform Tub/Shower Transfer: Tub transfer;shower seat;with min guard assist  OT Frequency: Min 2X/week   Barriers to D/C:            Co-evaluation              AM-PAC OT "6 Clicks" Daily Activity     Outcome Measure Help from another person eating meals?: A Little Help from another person taking care of personal grooming?: A Little Help from another person toileting, which includes using toliet, bedpan, or urinal?: A Little Help from another person bathing (including washing, rinsing, drying)?: A Little Help from another person to put on and taking off regular upper body clothing?: A Little Help from another person to put on and taking off regular lower body clothing?: A Little 6 Click Score: 18   End of Session Nurse Communication: Mobility status  Activity Tolerance: Patient limited by pain Patient left: in chair;with call bell/phone within reach;with chair alarm set  OT Visit Diagnosis: Unsteadiness on feet (R26.81);Cognitive communication deficit (R41.841);Pain Pain - part of body:  (head)                Time: 2620-3559 OT Time Calculation (min): 19 min Charges:  OT General Charges $OT Visit: 1 Visit OT Evaluation $  OT Eval Moderate Complexity: 1 Mod  Nilsa Nutting., OTR/L Acute Rehabilitation Services Pager [phone] Office [phone]   Lucille Passy M 04/09/2021, 9:48 AM

## 2021-04-09 NOTE — Progress Notes (Signed)
Pt put out only 150 mL of urine. RN checked foley placement. Foley catheter was out, and balloon was deflated. Replaced with purewick. Will continue to monitor.

## 2021-04-09 NOTE — Progress Notes (Signed)
   Providing Compassionate, Quality Care - Together  NEUROSURGERY PROGRESS NOTE   S: No issues overnight. Expressive aphasia  O: EXAM:  BP 121/62   Pulse 67   Temp 98.4 F (36.9 C) (Oral)   Resp 14   Ht 5\' 4"  (1.626 m)   Wt 72.6 kg   SpO2 94%   BMI 27.46 kg/m   Awake, alert,  PERRLA EOMI Expressive aphasia CNs grossly intact  5/5 BUE/BLE  Incision clean dry and intact  ASSESSMENT:  75 y.o. female with   Left frontal brain tumor, metastatic  Status post left frontal craniotomy resection of tumor  PLAN: -PT/OT -Speech path -Pain control -Decadron wean -Likely stepdown from unit today, possible discharge    Thank you for allowing me to participate in this patient's care.  Please do not hesitate to call with questions or concerns.   Elwin Sleight, [facility] Cell: [phone]

## 2021-04-09 NOTE — Evaluation (Signed)
Speech Language Pathology Evaluation Patient Details Name: Marie Hensley MRN: [id number] DOB: Oct 23, 1945 Today's Date: 04/09/2021 Time:  -     Problem List:  Patient Active Problem List   Diagnosis Date Noted   Brain tumor ([place]) 04/08/2021   Seizure ([place]) 04/07/2021   Brain metastasis ([place]) 03/09/2021   Focal seizure ([place]) 03/09/2021   Essential tremor 03/03/2021   Atherosclerosis of aorta ([place]) 03/03/2021   Pulmonary nodule 02/23/2021   Dyspnea on exertion 11/19/2020   Chronic kidney disease, stage 4 (severe) ([place]) 04/28/2020   Essential hypertension 10/28/2019   Hyperlipidemia 10/28/2019   Chronic pain of both shoulders 10/28/2019   Depression, major, single episode, complete remission ([place]) 10/28/2019   Past Medical History:  Past Medical History:  Diagnosis Date   Arthritis    Brain tumor ([place])    Depression    Dyspnea    WITH EXERTION-SEEING DR [name]   HTN (hypertension)    Hyperlipemia    Seizures ([place])    Past Surgical History:  Past Surgical History:  Procedure Laterality Date   APPENDECTOMY  4098   APPLICATION OF CRANIAL NAVIGATION N/A 04/08/2021   Procedure: APPLICATION OF CRANIAL NAVIGATION;  Surgeon: Judith Part, MD;  Location: [place];  Service: Neurosurgery;  Laterality: N/A;  RM 21   CRANIOTOMY Left 04/08/2021   Procedure: Left Craniotomy for tumor resection with brainlab;  Surgeon: Judith Part, MD;  Location: [facility];  Service: Neurosurgery;  Laterality: Left;   EYE SURGERY Bilateral    CATARACTS   OOPHORECTOMY Right    VIDEO BRONCHOSCOPY WITH ENDOBRONCHIAL NAVIGATION Left 12/23/2020   Procedure: VIDEO BRONCHOSCOPY WITH ENDOBRONCHIAL NAVIGATION;  Surgeon: Tyler Pita, MD;  Location: [facility];  Service: Pulmonary;  Laterality: Left;   VIDEO BRONCHOSCOPY WITH ENDOBRONCHIAL NAVIGATION Left 02/01/2021   Procedure: ROBOTIC ASSISTED VIDEO BRONCHOSCOPY WITH ENDOBRONCHIAL NAVIGATION;  Surgeon: Tyler Pita, MD;  Location: [facility]  [facility];  Service: Pulmonary;  Laterality: Left;   VIDEO BRONCHOSCOPY WITH ENDOBRONCHIAL ULTRASOUND Left 12/23/2020   Procedure: VIDEO BRONCHOSCOPY WITH ENDOBRONCHIAL ULTRASOUND;  Surgeon: Tyler Pita, MD;  Location: [facility];  Service: Pulmonary;  Laterality: Left;   VIDEO BRONCHOSCOPY WITH ENDOBRONCHIAL ULTRASOUND Left 02/01/2021   Procedure: ROBOTIC ASSITSTED VIDEO BRONCHOSCOPY WITH ENDOBRONCHIAL ULTRASOUND;  Surgeon: Tyler Pita, MD;  Location: [facility];  Service: Pulmonary;  Laterality: Left;   HPI:  Pt is a 75 y.o. female who presented 04/07/21 s/p seizure with aphasia postictally which is normal for pt post seizure, per chart review). Pt with L hemispheric brain mets from lung cancer. MRI 7/28 revealed Slight interval increase in size of left middle frontal convexity metastasis as compared to MRI from 04/01/2021. S/p L crani and tumor resection 7/28. PMH: arthritis, depression, dyspnea, HTN, brain metastasis from lung cancer, history of seizures, on Keppra 500 twice daily, s/p stereotactic radiosurgery for brain metastasis 04/06/21.   Assessment / Plan / Recommendation Clinical Impression  Pt presents with a primary apraxia with moderate expressive aphasia and mild language comprehension impairments. Pt is able to follow 2 step commands, but is inaccurate with three step/multistep commands and complex yes/no. She is able to identify letters and words. When attempting to articulate, she has groping inconsistent articulatory placement, but with auditory/visual placement cues pt can correctly repeat at word and short phrase level. Singing is benefical. Though she can spell words on a letter board that are spoken aloud, she cannot spell or write internally generated words for verbal expression. Her writing attempts are paraphasic  At this time she is unable to express her wants and needs. Pt will need therapy for total communication, use of gestures and alternative communication techniques  while also targeting apraxia and articulation tasks. Will f/u acutely, recommend the highest frequency and intensity of SLP f/u possible. Appears that OP SLP is the only option at this time. Pt will need 24 hour supervision given inability to communicate in case of emergency.    SLP Assessment  SLP Recommendation/Assessment: Patient needs continued Speech Lanaguage Pathology Services SLP Visit Diagnosis: Aphasia (R47.01);Apraxia (R48.2)    Follow Up Recommendations  Outpatient SLP    Frequency and Duration min 2x/week  2 weeks      SLP Evaluation Cognition  Overall Cognitive Status: Within Functional Limits for tasks assessed Orientation Level: Oriented X4       Comprehension  Auditory Comprehension Overall Auditory Comprehension: Impaired Yes/No Questions: Impaired Basic Biographical Questions: 76-100% accurate Basic Immediate Environment Questions: 75-100% accurate Complex Questions: 50-74% accurate Commands: Impaired One Step Basic Commands: 75-100% accurate Two Step Basic Commands: 75-100% accurate Multistep Basic Commands: 25-49% accurate Complex Commands: 25-49% accurate Conversation: Simple Interfering Components: Motor planning Reading Comprehension Reading Status: Within funtional limits    Expression Verbal Expression Overall Verbal Expression: Impaired Initiation: No impairment Automatic Speech: Name;Counting;Social Response Level of Generative/Spontaneous Verbalization: Word Repetition: Impaired Level of Impairment: Word level Naming: Impairment Responsive: Not tested Confrontation: Impaired Verbal Errors: Aware of errors Written Expression Dominant Hand: Right   Oral / Motor  Motor Speech Overall Motor Speech: Impaired Respiration: Within functional limits Phonation: Normal Articulation: Impaired Level of Impairment: Word Motor Planning: Impaired Level of Impairment: Word   GO                    Loney Peto, Katherene Ponto 04/09/2021, 3:09  PM

## 2021-04-10 LAB — GLUCOSE, CAPILLARY
Glucose-Capillary: 174 mg/dL — ABNORMAL HIGH (ref 70–99)
Glucose-Capillary: 151 mg/dL — ABNORMAL HIGH (ref 70–99)
Glucose-Capillary: 174 mg/dL — ABNORMAL HIGH (ref 70–99)
Glucose-Capillary: 124 mg/dL — ABNORMAL HIGH (ref 70–99)
Glucose-Capillary: 235 mg/dL — ABNORMAL HIGH (ref 70–99)
Glucose-Capillary: 157 mg/dL — ABNORMAL HIGH (ref 70–99)

## 2021-04-10 LAB — BASIC METABOLIC PANEL
Anion gap: 10 (ref 5–15)
BUN: 21 mg/dL (ref 8–23)
CO2: 25 mmol/L (ref 22–32)
Calcium: 9.1 mg/dL (ref 8.9–10.3)
Chloride: 101 mmol/L (ref 98–111)
Creatinine, Ser: 1.14 mg/dL — ABNORMAL HIGH (ref 0.44–1.00)
GFR, Estimated: 50 mL/min — ABNORMAL LOW (ref >60)
Glucose, Bld: 162 mg/dL — ABNORMAL HIGH (ref 70–99)
Potassium: 4.3 mmol/L (ref 3.5–5.1)
Sodium: 136 mmol/L (ref 135–145)

## 2021-04-10 LAB — CBC
HCT: 32.2 % — ABNORMAL LOW (ref 36.0–46.0)
Hemoglobin: 10.6 g/dL — ABNORMAL LOW (ref 12.0–15.0)
MCH: 32.6 pg (ref 26.0–34.0)
MCHC: 32.9 g/dL (ref 30.0–36.0)
MCV: 99.1 fL (ref 80.0–100.0)
Platelets: 139 10*3/uL — ABNORMAL LOW (ref 150–400)
RBC: 3.25 MIL/uL — ABNORMAL LOW (ref 3.87–5.11)
RDW: 13.3 % (ref 11.5–15.5)
WBC: 10.3 10*3/uL (ref 4.0–10.5)
nRBC: 0 % (ref 0.0–0.2)

## 2021-04-10 MED ORDER — DEXAMETHASONE 4 MG PO TABS: 2.0000 mg | ORAL_TABLET | Freq: Three times a day (TID) | ORAL | Status: DC

## 2021-04-10 MED ORDER — DEXAMETHASONE 4 MG PO TABS
4.0000 mg | ORAL_TABLET | Freq: Three times a day (TID) | ORAL | Status: DC
  Administered 2021-04-10 – 2021-04-11 (×4): 4 mg via ORAL
  Filled 2021-04-10 (×4): qty 1

## 2021-04-10 MED ORDER — DEXAMETHASONE 0.5 MG PO TABS: 1.0000 mg | ORAL_TABLET | Freq: Every day | ORAL | Status: DC

## 2021-04-10 MED ORDER — DEXAMETHASONE 4 MG PO TABS: 2.0000 mg | ORAL_TABLET | Freq: Two times a day (BID) | ORAL | Status: DC

## 2021-04-10 MED ORDER — DEXAMETHASONE 0.5 MG PO TABS: 1.0000 mg | ORAL_TABLET | Freq: Two times a day (BID) | ORAL | Status: DC

## 2021-04-10 NOTE — Progress Notes (Signed)
Subjective: NAEs o/n  Objective: Vital signs in last 24 hours: Temp:  [98.5 F (36.9 C)-99.6 F (37.6 C)] 99.6 F (37.6 C) (07/30 0800) Pulse Rate:  [55-93] 67 (07/30 0900) Resp:  [11-24] 15 (07/30 0900) BP: (100-130)/(53-83) 111/54 (07/30 0956) SpO2:  [86 %-94 %] 91 % (07/30 0900)  Intake/Output from previous day: 07/29 0701 - 07/30 0700 In: 460.2 [I.V.:460.2] Out: -  Intake/Output this shift: No intake/output data recorded.  NAD Expressive aphasia, FC x 4. No drift Incision c/d  Lab Results: Recent Labs    04/09/21 0231 04/10/21 0418  WBC 13.9* 10.3  HGB 10.5* 10.6*  HCT 32.8* 32.2*  PLT 168 139*   BMET Recent Labs    04/09/21 0231 04/10/21 0418  NA 136 136  K 4.2 4.3  CL 102 101  CO2 24 25  GLUCOSE 125* 162*  BUN 27* 21  CREATININE 1.23* 1.14*  CALCIUM 9.1 9.1    Studies/Results: MR BRAIN W WO CONTRAST  Result Date: 04/09/2021 CLINICAL DATA:  Follow-up examination status post craniotomy for tumor resection. EXAM: MRI HEAD WITHOUT AND WITH CONTRAST TECHNIQUE: Multiplanar, multiecho pulse sequences of the brain and surrounding structures were obtained without and with intravenous contrast. CONTRAST:  7.60mL GADAVIST GADOBUTROL 1 MMOL/ML IV SOLN COMPARISON:  Prior MRI from 04/08/2021. FINDINGS: Brain: Postoperative changes from interval left frontal craniotomy for resection of previously identified left frontal convexity tumor. Small extra-axial collection overlies the left cerebral convexity, measuring up to 1-2 mm in maximal thickness without significant mass effect. Scattered postoperative blood products with a probable few small foci of pneumocephalus noted about the resection cavity. Previously seen tumor has been resected. Small amount of smudgy enhancement along the inferior aspect of the resection cavity noted (series 19, image 15). Area of enhancement measures approximately 8 mm in size. Finding is indeterminate, and could reflect postoperative changes,  although a small amount of residual tumor is not excluded. Dural thickening and enhancement overlying the left cerebral convexity felt to be most consistent with normal expected postoperative change. Triangular focus of restricted diffusion seen extending from the resection cavity into the underlying left frontal centrum semi ovale, consistent with a small peri resection infarct (series 5, image 86). Vasogenic edema within the adjacent left cerebral hemispheres relatively similar to preoperative exam as is mild 4 mm of left-to-right shift. Remainder the brain is otherwise stable in appearance with no new finding or other abnormality identified. Vascular: Major intracranial vascular flow voids are maintained. Skull and upper cervical spine: Craniocervical junction within normal limits. Bone marrow signal intensity normal. Post craniotomy changes present at the left frontal calvarium and scalp. Sinuses/Orbits: Globes and orbital soft tissues demonstrate no acute finding. Paranasal sinuses remain largely clear. No mastoid effusion. Other: None. IMPRESSION: 1. Postoperative changes from interval left frontal craniotomy for tumor resection. 8 mm focus of enhancement along the inferior aspect of the resection cavity, indeterminate, and could be postoperative, although a small amount of residual tumor is not excluded. Attention at follow-up recommended. 2. Triangular focus of restricted diffusion extending from the resection cavity into the underlying left frontal centrum semi ovale, consistent with a small peri resection infarct. 3. Similar vasogenic edema within the adjacent left cerebral hemispheres with associated 4 mm of left-to-right shift. Electronically Signed   By: Jeannine Boga M.D.   On: 04/09/2021 05:17    Assessment/Plan: [name] s/p resection of left frontal brain tumor - cont therapies - likely outpatient speech therapy - downgrade; social work for dispo as  patient reportedly lives  alone   Marie Hensley 04/10/2021, 10:22 AM

## 2021-04-10 NOTE — Progress Notes (Signed)
Physical Therapy Treatment Patient Details Name: Marie Hensley MRN: [id number] DOB: 1945-11-24 Today's Date: 04/10/2021    History of Present Illness Pt is a 75 y.o. female who presented 04/07/21 s/p seizure with aphasia postictally which is normal for pt, per chart. Pt with L hemispheric brain mets from lung cancer. MRI 7/28 revealed Slight interval increase in size of left middle frontal convexity metastasis as compared to MRI from 04/01/2021. S/p L crani and tumor resection 7/28. PMH: arthritis, depression, dyspnea, HTN, brain metastasis from lung cancer, history of seizures, on Keppra 500 twice daily, s/p stereotactic radiosurgery for brain metastasis 04/06/21.    PT Comments    Pt progressing well towards her mobility goals; exhibits improved activity tolerance and ambulation distance today. Ambulating x 250 feet with no assistive device at a supervision level. No foot drag noted today, but continued decreased right step length and mild dynamic balance deficits. Pt becoming emotionally labile with difficulty communicating; worked on naming objects in pictures during walk which she did well with, but becomes more challenging with attempts to form complex sentences. D/c plan remains appropriate.     Follow Up Recommendations  Outpatient PT;Supervision/Assistance - 24 hour (neuro clinic)     Equipment Recommendations  None recommended by PT    Recommendations for Other Services       Precautions / Restrictions Precautions Precautions: Fall;Other (comment) Precaution Comments: seizures Restrictions Weight Bearing Restrictions: No    Mobility  Bed Mobility Overal bed mobility: Modified Independent                  Transfers Overall transfer level: Needs assistance Equipment used: None Transfers: Sit to/from Stand Sit to Stand: Supervision            Ambulation/Gait Ambulation/Gait assistance: Supervision Gait Distance (Feet): 250 Feet Assistive device:  None Gait Pattern/deviations: Step-through pattern;Decreased stride length;Decreased step length - right;Decreased dorsiflexion - right Gait velocity: reduced   General Gait Details: No right foot drag today, continues with decreased right step length. Able to statically hold balance while naming objects in picture   Stairs             Wheelchair Mobility    Modified Rankin (Stroke Patients Only) Modified Rankin (Stroke Patients Only) Pre-Morbid Rankin Score: No symptoms Modified Rankin: Moderately severe disability     Balance Overall balance assessment: Mild deficits observed, not formally tested                                          Cognition Arousal/Alertness: Awake/alert Behavior During Therapy: WFL for tasks assessed/performed;Agitated Overall Cognitive Status: Difficult to assess                                 General Comments: emotionally labile due to difficulty communicating; follows one step commands consistently      Exercises      General Comments        Pertinent Vitals/Pain Pain Assessment: No/denies pain    Home Living                      Prior Function            PT Goals (current goals can now be found in the care plan section) Acute Rehab PT Goals Patient Stated Goal: to improve PT  Goal Formulation: With patient Time For Goal Achievement: 04/22/21 Potential to Achieve Goals: Good Progress towards PT goals: Progressing toward goals    Frequency    Min 3X/week      PT Plan Current plan remains appropriate    Co-evaluation              AM-PAC PT "6 Clicks" Mobility   Outcome Measure  Help needed turning from your back to your side while in a flat bed without using bedrails?: None Help needed moving from lying on your back to sitting on the side of a flat bed without using bedrails?: None Help needed moving to and from a bed to a chair (including a wheelchair)?: A  Little Help needed standing up from a chair using your arms (e.g., wheelchair or bedside chair)?: A Little Help needed to walk in hospital room?: A Little Help needed climbing 3-5 steps with a railing? : A Little 6 Click Score: 20    End of Session Equipment Utilized During Treatment: Gait belt Activity Tolerance: Patient tolerated treatment well Patient left: with call bell/phone within reach;in chair;with chair alarm set Nurse Communication: Mobility status PT Visit Diagnosis: Unsteadiness on feet (R26.81);Other abnormalities of gait and mobility (R26.89);Difficulty in walking, not elsewhere classified (R26.2);Other symptoms and signs involving the nervous system (R29.898);Dizziness and giddiness (R42)     Time: 1410-1436 PT Time Calculation (min) (ACUTE ONLY): 26 min  Charges:  $Therapeutic Activity: 23-37 mins                     Wyona Almas, PT, DPT Acute Rehabilitation Services Pager [phone] Office [phone]    Deno Etienne 04/10/2021, 4:51 PM

## 2021-04-11 LAB — GLUCOSE, CAPILLARY
Glucose-Capillary: 189 mg/dL — ABNORMAL HIGH (ref 70–99)
Glucose-Capillary: 121 mg/dL — ABNORMAL HIGH (ref 70–99)
Glucose-Capillary: 195 mg/dL — ABNORMAL HIGH (ref 70–99)

## 2021-04-11 MED ORDER — HYDROCODONE-ACETAMINOPHEN 5-325 MG PO TABS: 1.0000 | ORAL_TABLET | ORAL | 0 refills | Status: DC | PRN

## 2021-04-11 MED ORDER — DEXAMETHASONE 2 MG PO TABS: ORAL_TABLET | ORAL | 0 refills | Status: AC

## 2021-04-11 NOTE — Plan of Care (Signed)
Patient progressing and adequate for discharge. Discharge education provided to patient and daughter, including instructions for follow-up OP SLP and PT. Discharged home with daughter via wheelchair to private vehicle. All personal belongings accounted for.

## 2021-04-11 NOTE — Discharge Instructions (Addendum)
No strenuous activity for 2 weeks after surgery. Keep well-hydrated. May shower with baby shampoo 3 days after surgery.  Do not scrub wound, but okay for suds to get on wound.

## 2021-04-11 NOTE — Discharge Summary (Signed)
Physician Discharge Summary  Patient ID: Marie Hensley MRN: [id number] DOB/AGE: Feb 07, 1946 75 y.o.  Admit date: 04/07/2021 Discharge date: 04/11/2021  Admission Diagnoses:  Left brain tumor  Discharge Diagnoses:  Same Active Problems:   Seizure [facility])   Brain tumor [facility])   Discharged Condition: Stable  Hospital Course:  DEBARA KAMPHUIS is a 75 y.o. female who underwent craniotomy for resection of a left frontal lobe brain tumor.  Postoperatively, she was admitted to the ICU.  She had expressive aphasia postop that seemed to be slowly improving.  An MRI brain performed postoperatively showed GTR. She was deemed ready for d/c home on 7/31 with outpatient speech.  Treatments: Surgery - Left craniotomy for resection of frontal lobe tumor  Discharge Exam: Blood pressure 126/66, pulse (!) 53, temperature 99.8 F (37.7 C), temperature source Oral, resp. rate 16, height 5\' 4"  (1.626 m), weight 72.6 kg, SpO2 92 %. Awake, alert, oriented Expressive aphasia.  Follows commands x 4. CN grossly intact 5/5 BUE/BLE Wound c/d/i  Disposition: Discharge disposition: 06-Home-Health Care Svc        Allergies as of 04/11/2021   No Known Allergies      Medication List     STOP taking these medications    methylPREDNISolone 4 MG Tbpk tablet Commonly known as: MEDROL DOSEPAK       TAKE these medications    acetaminophen 500 MG tablet Commonly known as: TYLENOL Take 1,000 mg by mouth every 6 (six) hours as needed for mild pain (or arthritis).   atorvastatin 40 MG tablet Commonly known as: LIPITOR Take 1 tablet (40 mg total) by mouth at bedtime.   cetirizine 10 MG tablet Commonly known as: ZYRTEC Take 10 mg by mouth daily as needed for allergies or rhinitis.   citalopram 20 MG tablet Commonly known as: CELEXA Take 1 tablet (20 mg total) by mouth at bedtime.   dexamethasone 2 MG tablet Commonly known as: DECADRON Take 2 tablets (4 mg total) by mouth every 8  (eight) hours for 2 days, THEN 1 tablet (2 mg total) every 8 (eight) hours for 2 days, THEN 1 tablet (2 mg total) 2 (two) times daily for 2 days, THEN 0.5 tablets (1 mg total) 2 (two) times daily for 2 days, THEN 0.5 tablets (1 mg total) daily for 2 days. Start taking on: April 11, 2021   fluticasone 50 MCG/ACT nasal spray Commonly known as: FLONASE Place 1 spray into both nostrils daily as needed for allergies.   HYDROcodone-acetaminophen 5-325 MG tablet Commonly known as: NORCO/VICODIN Take 1 tablet by mouth every 4 (four) hours as needed for moderate pain.   levETIRAcetam 500 MG tablet Commonly known as: Keppra Take 1 tablet (500 mg total) by mouth 2 (two) times daily.   loperamide 2 MG tablet Commonly known as: IMODIUM A-D Take 2 mg by mouth 2 (two) times daily as needed for diarrhea or loose stools.       ASK your doctor about these medications    lisinopril-hydrochlorothiazide 10-12.5 MG tablet Commonly known as: ZESTORETIC Take 1 tablet by mouth daily.   propranolol ER 60 MG 24 hr capsule Commonly known as: INDERAL LA Take 1 capsule (60 mg total) by mouth at bedtime.        Follow-up Information     Judith Part, MD. Schedule an appointment as soon as possible for a visit in 10 day(s).   Specialty: Neurosurgery Contact information: [place] [address] [phone]  Signed: Vallarie Mare 04/11/2021, 11:28 AM

## 2021-04-12 ENCOUNTER — Inpatient Hospital Stay: Payer: Medicare HMO | Attending: Internal Medicine

## 2021-04-12 DIAGNOSIS — Z87891 Personal history of nicotine dependence: Secondary | ICD-10-CM | POA: Insufficient documentation

## 2021-04-12 DIAGNOSIS — Z79899 Other long term (current) drug therapy: Secondary | ICD-10-CM | POA: Insufficient documentation

## 2021-04-12 DIAGNOSIS — I10 Essential (primary) hypertension: Secondary | ICD-10-CM | POA: Insufficient documentation

## 2021-04-12 DIAGNOSIS — R569 Unspecified convulsions: Secondary | ICD-10-CM | POA: Insufficient documentation

## 2021-04-12 DIAGNOSIS — F32A Depression, unspecified: Secondary | ICD-10-CM | POA: Insufficient documentation

## 2021-04-12 DIAGNOSIS — C3411 Malignant neoplasm of upper lobe, right bronchus or lung: Secondary | ICD-10-CM | POA: Insufficient documentation

## 2021-04-12 DIAGNOSIS — C7931 Secondary malignant neoplasm of brain: Secondary | ICD-10-CM | POA: Insufficient documentation

## 2021-04-12 DIAGNOSIS — E785 Hyperlipidemia, unspecified: Secondary | ICD-10-CM | POA: Insufficient documentation

## 2021-04-12 DIAGNOSIS — M1990 Unspecified osteoarthritis, unspecified site: Secondary | ICD-10-CM | POA: Insufficient documentation

## 2021-04-12 LAB — TYPE AND SCREEN
ABO/RH(D): O POS
Antibody Screen: NEGATIVE
Unit division: 0
Unit division: 0

## 2021-04-12 LAB — BPAM RBC
Blood Product Expiration Date: 202208292359
Blood Product Expiration Date: 202208292359
Unit Type and Rh: 5100
Unit Type and Rh: 5100

## 2021-04-13 ENCOUNTER — Other Ambulatory Visit: Payer: Self-pay | Admitting: Family Medicine

## 2021-04-13 LAB — SURGICAL PATHOLOGY

## 2021-04-13 NOTE — Telephone Encounter (Signed)
Last office visit 03/03/2021 for discuss lab results.  Last refilled 03/04/2021 for #60 with no refills.  No future appointments with PCP.

## 2021-04-16 ENCOUNTER — Encounter: Payer: Self-pay | Admitting: *Deleted

## 2021-04-16 NOTE — Progress Notes (Signed)
Late Entry - Received referral for patient to Dr. Julien Nordmann for lung adenocarcinoma.  Spoke to Braulio Conte, patient's daughter and scheduled patient for [facility] 04/22/21. Instructions given and daughter's questions answered.

## 2021-04-19 ENCOUNTER — Inpatient Hospital Stay: Payer: Medicare HMO | Admitting: *Deleted

## 2021-04-20 ENCOUNTER — Other Ambulatory Visit: Payer: Self-pay | Admitting: *Deleted

## 2021-04-20 DIAGNOSIS — C3412 Malignant neoplasm of upper lobe, left bronchus or lung: Secondary | ICD-10-CM

## 2021-04-21 ENCOUNTER — Encounter: Payer: Self-pay | Admitting: *Deleted

## 2021-04-21 NOTE — Progress Notes (Signed)
Marie Hensley is newly dx with NSCLC and is coming to see Dr. Julien Nordmann tomorrow for a medical oncology appt.  I contacted pathology to see if moleculars were sent. Wait for response.

## 2021-04-22 ENCOUNTER — Other Ambulatory Visit: Payer: Self-pay | Admitting: *Deleted

## 2021-04-22 ENCOUNTER — Encounter: Payer: Self-pay | Admitting: Internal Medicine

## 2021-04-22 ENCOUNTER — Inpatient Hospital Stay: Payer: Medicare HMO

## 2021-04-22 ENCOUNTER — Encounter: Payer: Self-pay | Admitting: *Deleted

## 2021-04-22 ENCOUNTER — Other Ambulatory Visit: Payer: Self-pay

## 2021-04-22 ENCOUNTER — Inpatient Hospital Stay (HOSPITAL_BASED_OUTPATIENT_CLINIC_OR_DEPARTMENT_OTHER): Payer: Medicare HMO | Admitting: Internal Medicine

## 2021-04-22 VITALS — BP 131/72 | HR 64 | Temp 97.4°F | Resp 18 | Ht 64.0 in | Wt 157.4 lb

## 2021-04-22 DIAGNOSIS — F32A Depression, unspecified: Secondary | ICD-10-CM | POA: Diagnosis not present

## 2021-04-22 DIAGNOSIS — C3411 Malignant neoplasm of upper lobe, right bronchus or lung: Secondary | ICD-10-CM

## 2021-04-22 DIAGNOSIS — E785 Hyperlipidemia, unspecified: Secondary | ICD-10-CM | POA: Diagnosis not present

## 2021-04-22 DIAGNOSIS — R69 Illness, unspecified: Secondary | ICD-10-CM | POA: Diagnosis not present

## 2021-04-22 DIAGNOSIS — C3412 Malignant neoplasm of upper lobe, left bronchus or lung: Secondary | ICD-10-CM

## 2021-04-22 DIAGNOSIS — Z5111 Encounter for antineoplastic chemotherapy: Secondary | ICD-10-CM

## 2021-04-22 DIAGNOSIS — R569 Unspecified convulsions: Secondary | ICD-10-CM | POA: Diagnosis not present

## 2021-04-22 DIAGNOSIS — Z7189 Other specified counseling: Secondary | ICD-10-CM | POA: Insufficient documentation

## 2021-04-22 DIAGNOSIS — M1990 Unspecified osteoarthritis, unspecified site: Secondary | ICD-10-CM | POA: Diagnosis not present

## 2021-04-22 DIAGNOSIS — I10 Essential (primary) hypertension: Secondary | ICD-10-CM | POA: Diagnosis not present

## 2021-04-22 DIAGNOSIS — D496 Neoplasm of unspecified behavior of brain: Secondary | ICD-10-CM

## 2021-04-22 DIAGNOSIS — C7931 Secondary malignant neoplasm of brain: Secondary | ICD-10-CM | POA: Diagnosis not present

## 2021-04-22 DIAGNOSIS — Z87891 Personal history of nicotine dependence: Secondary | ICD-10-CM | POA: Diagnosis not present

## 2021-04-22 DIAGNOSIS — Z79899 Other long term (current) drug therapy: Secondary | ICD-10-CM | POA: Diagnosis not present

## 2021-04-22 LAB — CBC WITH DIFFERENTIAL (CANCER CENTER ONLY)
Abs Immature Granulocytes: 0.18 10*3/uL — ABNORMAL HIGH (ref 0.00–0.07)
Basophils Absolute: 0 10*3/uL (ref 0.0–0.1)
Basophils Relative: 0 %
Eosinophils Absolute: 0.2 10*3/uL (ref 0.0–0.5)
Eosinophils Relative: 3 %
HCT: 34.4 % — ABNORMAL LOW (ref 36.0–46.0)
Hemoglobin: 11.5 g/dL — ABNORMAL LOW (ref 12.0–15.0)
Immature Granulocytes: 2 %
Lymphocytes Relative: 16 %
Lymphs Abs: 1.5 10*3/uL (ref 0.7–4.0)
MCH: 32.7 pg (ref 26.0–34.0)
MCHC: 33.4 g/dL (ref 30.0–36.0)
MCV: 97.7 fL (ref 80.0–100.0)
Monocytes Absolute: 0.7 10*3/uL (ref 0.1–1.0)
Monocytes Relative: 7 %
Neutro Abs: 6.6 10*3/uL (ref 1.7–7.7)
Neutrophils Relative %: 72 %
Platelet Count: 200 10*3/uL (ref 150–400)
RBC: 3.52 MIL/uL — ABNORMAL LOW (ref 3.87–5.11)
RDW: 13.2 % (ref 11.5–15.5)
WBC Count: 9.2 10*3/uL (ref 4.0–10.5)
nRBC: 0 % (ref 0.0–0.2)

## 2021-04-22 LAB — CMP (CANCER CENTER ONLY)
ALT: 29 U/L (ref 0–44)
AST: 23 U/L (ref 15–41)
Albumin: 3.3 g/dL — ABNORMAL LOW (ref 3.5–5.0)
Alkaline Phosphatase: 49 U/L (ref 38–126)
Anion gap: 8 (ref 5–15)
BUN: 21 mg/dL (ref 8–23)
CO2: 24 mmol/L (ref 22–32)
Calcium: 8.9 mg/dL (ref 8.9–10.3)
Chloride: 106 mmol/L (ref 98–111)
Creatinine: 1.23 mg/dL — ABNORMAL HIGH (ref 0.44–1.00)
GFR, Estimated: 46 mL/min — ABNORMAL LOW (ref >60)
Glucose, Bld: 119 mg/dL — ABNORMAL HIGH (ref 70–99)
Potassium: 3.3 mmol/L — ABNORMAL LOW (ref 3.5–5.1)
Sodium: 138 mmol/L (ref 135–145)
Total Bilirubin: 0.7 mg/dL (ref 0.3–1.2)
Total Protein: 6.1 g/dL — ABNORMAL LOW (ref 6.5–8.1)

## 2021-04-22 MED ORDER — PROCHLORPERAZINE MALEATE 10 MG PO TABS: 10.0000 mg | ORAL_TABLET | Freq: Four times a day (QID) | ORAL | 0 refills | Status: AC | PRN

## 2021-04-22 NOTE — Progress Notes (Signed)
[place] Telephone:([phone]) [phone]   [phone]  CONSULT NOTE  REFERRING PHYSICIAN: Dr. Tyler Pita  REASON FOR CONSULTATION:  75 years old white female recently diagnosed with lung cancer.  HPI Marie Hensley is a 75 y.o. female with past medical history significant for hypertension, dyslipidemia, depression, osteoarthritis and seizure.  The patient mentioned that she saw her primary care physician in March 2022 complaining of increasing shortness of breath and fatigue.  She had a chest x-ray on November 19, 2020 and that showed worrisome 1.9 cm left upper lobe nodule concerning for malignancy.  She had CT scan of the chest with contrast on December 09, 2020 and that showed a spiculated 2.9 x 1.8 cm left upper lobe nodule with no enlarged with mediastinal, hilar or axillary lymph nodes.  The patient was referred to Dr. [name] and on December 22, 2020 she had a PET scan and it showed hypermetabolic spiculated left upper lobe nodule with SUV max of 8.2 compatible with malignancy.  There was 3 small hypermetabolic left hilar lymph nodes present with maximum SUV of 6.9 favoring local hilar involvement.  There was also a faintly metabolic peribronchovascular node between the dominant nodule and the left hilum.  The patient underwent bronchoscopy with electromagnetic navigation bronchoscopy and EBUS under the care of Dr. [name] twice on December 23, 2020 as well as Feb 01, 2021 but the biopsies showed no malignant cells.  The patient was followed by observation but unfortunately on March 04, 2021 she presented with aphasia to the emergency department and CT of the head showed 5.2 x 4.3 x 5.6 cm region of vasogenic edema within the mid to posterior left frontal lobe highly suspicious for an underlying mass/metastasis at the site.  She was started on treatment with steroids and repeat MRI of the brain on April 01, 2021 showed solitary left medial frontal lobe metastasis with  progressive vasogenic edema and mild midline shift.  On April 08, 2021 the patient underwent left craniotomy for tumor resection after SRS under the care of Dr. Zada Finders.  The final pathology [id number]) showed metastatic adenocarcinoma. Immunohistochemistry is positive for TTF-1 and Napsin A. Cytokeratin 5/6 and p40 are negative. The immunoprofile is consistent with a lung  adenocarcinoma. The tissue block was sent to [facility] for molecular studies and PD-L1 expression. The patient was referred to me today for evaluation and recommendation regarding treatment of her condition. When seen today she continues to have some slurred speech but otherwise feeling fine.  She lost around 15 pounds in the last 2 months.  She denied having any current chest pain, shortness of breath, cough or hemoptysis.  She denied having any fever or chills.  She has no nausea, vomiting, diarrhea or constipation.  She denied having any headache or visual changes. Family history significant for mother with hypertension and died at age 45.  Father had alcoholism.  Sister had lymphoma and daughter had Addison's disease. The patient is single and has 3 children.  She was accompanied today by her daughter Marie Hensley.  She used to work in [employer].  She has a history of smoking less than 1 pack/day for around 20 years but quit in 1992.  She used to drink alcohol regularly but not recently.  She has no history of drug abuse.  HPI  Past Medical History:  Diagnosis Date   Arthritis    Brain tumor ([place])    Depression    Dyspnea    WITH EXERTION-SEEING  DR [name]   HTN (hypertension)    Hyperlipemia    Seizures ([place])     Past Surgical History:  Procedure Laterality Date   APPENDECTOMY  2706   APPLICATION OF CRANIAL NAVIGATION N/A 04/08/2021   Procedure: APPLICATION OF CRANIAL NAVIGATION;  Surgeon: Judith Part, MD;  Location: [facility];  Service: Neurosurgery;  Laterality: N/A;  RM 21   CRANIOTOMY Left  04/08/2021   Procedure: Left Craniotomy for tumor resection with brainlab;  Surgeon: Judith Part, MD;  Location: [facility];  Service: Neurosurgery;  Laterality: Left;   EYE SURGERY Bilateral    CATARACTS   OOPHORECTOMY Right    VIDEO BRONCHOSCOPY WITH ENDOBRONCHIAL NAVIGATION Left 12/23/2020   Procedure: VIDEO BRONCHOSCOPY WITH ENDOBRONCHIAL NAVIGATION;  Surgeon: Tyler Pita, MD;  Location: [facility];  Service: Pulmonary;  Laterality: Left;   VIDEO BRONCHOSCOPY WITH ENDOBRONCHIAL NAVIGATION Left 02/01/2021   Procedure: ROBOTIC ASSISTED VIDEO BRONCHOSCOPY WITH ENDOBRONCHIAL NAVIGATION;  Surgeon: Tyler Pita, MD;  Location: [facility];  Service: Pulmonary;  Laterality: Left;   VIDEO BRONCHOSCOPY WITH ENDOBRONCHIAL ULTRASOUND Left 12/23/2020   Procedure: VIDEO BRONCHOSCOPY WITH ENDOBRONCHIAL ULTRASOUND;  Surgeon: Tyler Pita, MD;  Location: [facility];  Service: Pulmonary;  Laterality: Left;   VIDEO BRONCHOSCOPY WITH ENDOBRONCHIAL ULTRASOUND Left 02/01/2021   Procedure: ROBOTIC ASSITSTED VIDEO BRONCHOSCOPY WITH ENDOBRONCHIAL ULTRASOUND;  Surgeon: Tyler Pita, MD;  Location: [facility];  Service: Pulmonary;  Laterality: Left;    Family History  Problem Relation Age of Onset   Addison's disease Daughter    Hypertension Mother        died at 40   Alcohol abuse Father     Social History Social History   Tobacco Use   Smoking status: Former    Packs/day: 0.50    Years: 20.00    Pack years: 10.00    Types: Cigarettes    Quit date: 10/27/1989    Years since quitting: 31.5   Smokeless tobacco: Never   Tobacco comments:    Smoked off and on in 20 year period  Vaping Use   Vaping Use: Never used  Substance Use Topics   Alcohol use: Yes    Comment: 3-4 times a week, 2oz liquor    Drug use: Never    No Known Allergies  Current Outpatient Medications  Medication Sig Dispense Refill   acetaminophen (TYLENOL) 500 MG tablet Take 1,000 mg by mouth every 6 (six) hours  as needed for mild pain (or arthritis).     atorvastatin (LIPITOR) 40 MG tablet Take 1 tablet (40 mg total) by mouth at bedtime. 90 tablet 3   cetirizine (ZYRTEC) 10 MG tablet Take 10 mg by mouth daily as needed for allergies or rhinitis.     citalopram (CELEXA) 20 MG tablet Take 1 tablet (20 mg total) by mouth at bedtime. 90 tablet 1   fluticasone (FLONASE) 50 MCG/ACT nasal spray Place 1 spray into both nostrils daily as needed for allergies.     HYDROcodone-acetaminophen (NORCO/VICODIN) 5-325 MG tablet Take 1 tablet by mouth every 4 (four) hours as needed for moderate pain. 30 tablet 0   levETIRAcetam (KEPPRA) 500 MG tablet TAKE ONE TABLET BY MOUTH TWICE A DAY 60 tablet 0   lisinopril-hydrochlorothiazide (ZESTORETIC) 10-12.5 MG tablet Take 1 tablet by mouth daily. (Patient taking differently: Take 1 tablet by mouth at bedtime.) 90 tablet 3   loperamide (IMODIUM A-D) 2 MG tablet Take 2 mg by mouth 2 (two) times daily as needed for diarrhea  or loose stools.     propranolol ER (INDERAL LA) 60 MG 24 hr capsule Take 1 capsule (60 mg total) by mouth at bedtime. (Patient taking differently: Take 60 mg by mouth in the morning.) 90 capsule 3   No current facility-administered medications for this visit.    Review of Systems  Constitutional: positive for weight loss Eyes: negative Ears, nose, mouth, throat, and face: negative Respiratory: negative Cardiovascular: negative Gastrointestinal: negative Genitourinary:negative Integument/breast: negative Hematologic/lymphatic: negative Musculoskeletal:negative Neurological: positive for speech problems Behavioral/Psych: negative Endocrine: negative Allergic/Immunologic: negative  Physical Exam  YWV:PXTGG, healthy, no distress, well nourished, and well developed SKIN: skin color, texture, turgor are normal, no rashes or significant lesions HEAD: Normocephalic, No masses, lesions, tenderness or abnormalities EYES: normal, PERRLA, Conjunctiva are  pink and non-injected EARS: External ears normal, Canals clear OROPHARYNX:no exudate, no erythema, and lips, buccal mucosa, and tongue normal  NECK: supple, no adenopathy, no JVD LYMPH:  no palpable lymphadenopathy, no hepatosplenomegaly BREAST:not examined LUNGS: clear to auscultation , and palpation HEART: regular rate & rhythm, no murmurs, and no gallops ABDOMEN:abdomen soft, non-tender, normal bowel sounds, and no masses or organomegaly BACK: No CVA tenderness, Range of motion is normal EXTREMITIES:no joint deformities, effusion, or inflammation, no edema  NEURO: alert & oriented x 3 with fluent speech, no focal motor/sensory deficits  PERFORMANCE STATUS: ECOG 1  LABORATORY DATA: Lab Results  Component Value Date   WBC 10.3 04/10/2021   HGB 10.6 (L) 04/10/2021   HCT 32.2 (L) 04/10/2021   MCV 99.1 04/10/2021   PLT 139 (L) 04/10/2021      Chemistry      Component Value Date/Time   NA 136 04/10/2021 0418   K 4.3 04/10/2021 0418   CL 101 04/10/2021 0418   CO2 25 04/10/2021 0418   BUN 21 04/10/2021 0418   CREATININE 1.14 (H) 04/10/2021 0418      Component Value Date/Time   CALCIUM 9.1 04/10/2021 0418   ALKPHOS 44 04/07/2021 1927   AST 28 04/07/2021 1927   ALT 22 04/07/2021 1927   BILITOT 0.9 04/07/2021 1927       RADIOGRAPHIC STUDIES: MR BRAIN W WO CONTRAST  Result Date: 04/09/2021 CLINICAL DATA:  Follow-up examination status post craniotomy for tumor resection. EXAM: MRI HEAD WITHOUT AND WITH CONTRAST TECHNIQUE: Multiplanar, multiecho pulse sequences of the brain and surrounding structures were obtained without and with intravenous contrast. CONTRAST:  7.54m GADAVIST GADOBUTROL 1 MMOL/ML IV SOLN COMPARISON:  Prior MRI from 04/08/2021. FINDINGS: Brain: Postoperative changes from interval left frontal craniotomy for resection of previously identified left frontal convexity tumor. Small extra-axial collection overlies the left cerebral convexity, measuring up to 1-2 mm in  maximal thickness without significant mass effect. Scattered postoperative blood products with a probable few small foci of pneumocephalus noted about the resection cavity. Previously seen tumor has been resected. Small amount of smudgy enhancement along the inferior aspect of the resection cavity noted (series 19, image 15). Area of enhancement measures approximately 8 mm in size. Finding is indeterminate, and could reflect postoperative changes, although a small amount of residual tumor is not excluded. Dural thickening and enhancement overlying the left cerebral convexity felt to be most consistent with normal expected postoperative change. Triangular focus of restricted diffusion seen extending from the resection cavity into the underlying left frontal centrum semi ovale, consistent with a small peri resection infarct (series 5, image 86). Vasogenic edema within the adjacent left cerebral hemispheres relatively similar to preoperative exam as is mild 4 mm  of left-to-right shift. Remainder the brain is otherwise stable in appearance with no new finding or other abnormality identified. Vascular: Major intracranial vascular flow voids are maintained. Skull and upper cervical spine: Craniocervical junction within normal limits. Bone marrow signal intensity normal. Post craniotomy changes present at the left frontal calvarium and scalp. Sinuses/Orbits: Globes and orbital soft tissues demonstrate no acute finding. Paranasal sinuses remain largely clear. No mastoid effusion. Other: None. IMPRESSION: 1. Postoperative changes from interval left frontal craniotomy for tumor resection. 8 mm focus of enhancement along the inferior aspect of the resection cavity, indeterminate, and could be postoperative, although a small amount of residual tumor is not excluded. Attention at follow-up recommended. 2. Triangular focus of restricted diffusion extending from the resection cavity into the underlying left frontal centrum semi  ovale, consistent with a small peri resection infarct. 3. Similar vasogenic edema within the adjacent left cerebral hemispheres with associated 4 mm of left-to-right shift. Electronically Signed   By: Jeannine Boga M.D.   On: 04/09/2021 05:17   MR Brain W and Wo Contrast  Result Date: 04/08/2021 CLINICAL DATA:  Initial evaluation for acute aphasia, neuro deficit. EXAM: MRI HEAD WITHOUT AND WITH CONTRAST TECHNIQUE: Multiplanar, multiecho pulse sequences of the brain and surrounding structures were obtained without and with intravenous contrast. CONTRAST:  7.86m GADAVIST GADOBUTROL 1 MMOL/ML IV SOLN COMPARISON:  Recent MRI from 04/01/2021. FINDINGS: Brain: Mild age-related cerebral and cerebellar atrophy, stable. Known solitary metastasis again seen at the peripheral left frontal convexity, slightly increased in size on today's exam now measuring 2.3 x 1.7 x 1.9 cm, previously 2.1 x 1.5 x 1.6 cm when measured in a similar fashion. Margins of the lesion are somewhat more ill-defined as compared to prior. Overlying mild dural thickening and enhancement noted, similar. Associated vasogenic edema throughout the adjacent left frontal parietal region with up to 4 mm of left-to-right shift, similar to prior. No other new metastatic lesions or abnormal enhancement seen elsewhere within the brain. No abnormal foci of restricted diffusion to suggest acute or subacute ischemia. Gray-white matter differentiation otherwise maintained. No areas of chronic cortical infarction. No significant cerebral white matter disease for age. No evidence for acute or chronic intracranial hemorrhage. No other mass lesion. No hydrocephalus or extra-axial fluid collection. Pituitary gland and suprasellar region normal. Vascular: Major intracranial vascular flow voids are maintained. Skull and upper cervical spine: Craniocervical junction within normal limits. Bone marrow signal intensity normal. No focal marrow replacing lesion.  Hyperostosis frontalis interna noted. No scalp soft tissue abnormality. Sinuses/Orbits: Prior bilateral ocular lens replacement. Globes and orbital soft tissues demonstrate no acute finding. Paranasal sinuses are largely clear. No mastoid effusion. Inner ear structures grossly normal. Other: None. IMPRESSION: 1. Slight interval increase in size of left frontal convexity metastasis as compared to MRI from 04/01/2021. Associated vasogenic edema and 4 mm of left-to-right shift is otherwise relatively stable. No new lesions identified. 2. Otherwise essentially negative brain MRI for age. No other acute intracranial abnormality. Electronically Signed   By: BJeannine BogaM.D.   On: 04/08/2021 01:23   MR Brain W Wo Contrast  Result Date: 04/02/2021 CLINICAL DATA:  Brain mass.  History of lung cancer EXAM: MRI HEAD WITHOUT AND WITH CONTRAST TECHNIQUE: Multiplanar, multiecho pulse sequences of the brain and surrounding structures were obtained without and with intravenous contrast. CONTRAST:  159mMULTIHANCE GADOBENATE DIMEGLUMINE 529 MG/ML IV SOLN COMPARISON:  MRI head fifty 11/30/2018 through FINDINGS: Brain: Enhancing mass lesion left middle frontal cortex laterally and superiorly shows  interval progression. The enhancing component measures 21 x 15 mm on axial images, compared with 18 x 13 mm previously. Increased vasogenic edema in the white matter. No associated hemorrhage. No second lesion identified. Ventricle size normal. Mild midline shift to the right has progressed measuring approximately 3 mm. Negative for acute infarct.  Negative for hemorrhage Vascular: Normal arterial flow voids. Skull and upper cervical spine: No skeletal metastasis identified. Sinuses/Orbits: Negative Other: None IMPRESSION: Solitary metastasis left middle frontal lobe. There is been interval growth since the prior MRI of 03/04/2021. Progressive vasogenic edema and mild midline shift compared to the prior study. No second lesion  identified. Electronically Signed   By: Franchot Gallo M.D.   On: 04/02/2021 12:07   DG Chest Portable 1 View  Result Date: 04/07/2021 CLINICAL DATA:  Aphasia.  Seizures. EXAM: PORTABLE CHEST 1 VIEW COMPARISON:  Chest radiograph 02/01/2021,, [place] [address] FINDINGS: Lung volumes are low. Upper normal heart size likely accentuated by low lung volumes. Aortic atherosclerosis. Known left upper lobe nodule is faintly visualized. Minimal atelectasis in the left lung base. No acute airspace disease. No pneumothorax or large pleural effusion. No acute osseous abnormalities are seen. IMPRESSION: 1. Low lung volumes with left basilar atelectasis. 2. Known left upper lobe nodule is faintly visualized. Electronically Signed   By: Keith Rake M.D.   On: 04/07/2021 20:00    ASSESSMENT: This is a very pleasant 75 years old white female recently diagnosed with stage IV (T1c, N1, M1b) non-small cell lung cancer, adenocarcinoma presented with locally advanced disease in the left lung as left upper lobe lung nodule in addition to left hilar adenopathy and solitary left frontal brain metastasis s/p resection of the brain tumor with SRS.  This was diagnosed in July 2022.   PLAN: I had a lengthy discussion with the patient and her daughter today about her current disease stage, prognosis and treatment options.  I personally and independently reviewed the scan images and discussed the result and showed the images to the patient and her daughter. I explained to the patient that she has a stage IV non-small cell lung cancer which technically incurable condition but with her presentation of solitary brain metastasis and locally advanced disease in the chest we could approach her in a different treatment plan with aggressive treatment for the locally advanced disease in the chest followed by additional systemic therapy in the future. I sent her tissue biopsy for molecular studies to identify any actionable mutations that could be  used for treatment in the future. Also discussed with the patient her treatment options including palliative care versus standard palliative systemic chemotherapy plus minus immunotherapy versus aggressive approach with concurrent chemoradiation for the locally advanced disease in the chest since the solitary brain metastasis has been resected. The patient and her daughter would like to consider the treatment for the locally advanced disease of the chest and they are in agreement to proceed with concurrent chemoradiation with weekly carboplatin for AUC of 2 and paclitaxel 45 Mg/M2 for 6-7 weeks followed by consolidation immunotherapy if the patient has no evidence for disease progression or treatment with targeted therapy if she has an actionable mutation. I discussed with the patient the adverse effect of the chemotherapy including but not limited to alopecia, myelosuppression, nausea and vomiting, peripheral neuropathy, liver or renal dysfunction. The patient is expected to start the first cycle of her treatment on May 03, 2021.  She will see Dr. Tammi Klippel for discussion of the radiotherapy option. I will call  her pharmacy with prescription for Compazine 10 mg p.o. every 6 hours as needed for nausea. The patient will come back for follow-up visit on May 10, 2021 for management of any adverse effect of her treatment. She will have a chemotherapy education class before starting the first dose of her treatment. She was advised to call immediately if she has any other concerning symptoms in the interval.  The patient voices understanding of current disease status and treatment options and is in agreement with the current care plan.  All questions were answered. The patient knows to call the clinic with any problems, questions or concerns. We can certainly see the patient much sooner if necessary.  Thank you so much for allowing me to participate in the care of [name]. I will continue to  follow up the patient with you and assist in her care.  The total time spent in the appointment was 90 minutes.  Disclaimer: This note was dictated with voice recognition software. Similar sounding words can inadvertently be transcribed and may not be corrected upon review.   Eilleen Kempf April 22, 2021, 12:45 PM

## 2021-04-22 NOTE — Progress Notes (Signed)
Moleculars and PDL 1 test requested to be tested and sent to [facility] per Dr. Julien Nordmann.

## 2021-04-22 NOTE — Progress Notes (Signed)
Oncology Nurse Navigator Documentation  Oncology Nurse Navigator Flowsheets 04/22/2021  Abnormal Finding Date 12/09/2020  Confirmed Diagnosis Date 04/08/2021  Diagnosis Status Pending Molecular Studies  Planned Course of Treatment Chemo/Radiation Concurrent  Phase of Treatment Radiation  Navigator Follow Up Date: 04/27/2021  Navigator Follow Up Reason: Appointment Review  Navigator Location [facility]  Navigator Encounter Type Clinic/MDC;Initial [place] Date 04/22/2021  Multidisiplinary Clinic Type Thoracic  Treatment Initiated Date 04/08/2021  Patient Visit Type Initial;MedOnc  Treatment Phase Pre-Tx/Tx Discussion  Barriers/Navigation Needs Coordination of Care;Education  Education Newly Diagnosed Cancer Education;Other  Interventions Psycho-Social Support;Coordination of Care;Education  Acuity Level 2-Minimal Needs (1-2 Barriers Identified)  Coordination of Care Other  Education Method Verbal;Written  Time Spent with Patient 81

## 2021-04-22 NOTE — Progress Notes (Signed)
START OFF PATHWAY REGIMEN - Non-Small Cell Lung   [id number]:Carboplatin AUC=2 + Paclitaxel 45 mg/m2 Weekly:   Administer weekly:     Paclitaxel      Carboplatin   **Always confirm dose/schedule in your pharmacy ordering system**  Patient Characteristics: Preoperative or Nonsurgical Candidate (Clinical Staging), Distant Metastasis Therapeutic Status: Preoperative or Nonsurgical Candidate (Clinical Staging) AJCC T Category: cT1c AJCC N Category: cN1 AJCC M Category: cM1b AJCC 8 Stage Grouping: IVA Intent of Therapy: Non-Curative / Palliative Intent, Discussed with Patient

## 2021-04-22 NOTE — Progress Notes (Signed)
The proposed treatment discussed in cancer conference is for discussion purpose only and is not a binding recommendation. The patient was not physically examined nor present for their treatment options. Therefore, final treatment plans cannot be decided.  ?

## 2021-04-23 ENCOUNTER — Telehealth: Payer: Self-pay | Admitting: Internal Medicine

## 2021-04-23 DIAGNOSIS — C7931 Secondary malignant neoplasm of brain: Secondary | ICD-10-CM | POA: Diagnosis not present

## 2021-04-23 NOTE — Telephone Encounter (Signed)
Scheduled appts per 8/11 los. Called pt, no answer. Left msg with appts dates and times.

## 2021-04-26 ENCOUNTER — Encounter (HOSPITAL_COMMUNITY): Payer: Self-pay | Admitting: Internal Medicine

## 2021-04-27 ENCOUNTER — Other Ambulatory Visit: Payer: Self-pay | Admitting: Radiation Therapy

## 2021-04-27 ENCOUNTER — Encounter (HOSPITAL_COMMUNITY): Payer: Self-pay

## 2021-04-27 DIAGNOSIS — C7931 Secondary malignant neoplasm of brain: Secondary | ICD-10-CM

## 2021-04-28 ENCOUNTER — Ambulatory Visit: Payer: Medicare HMO | Admitting: Radiation Oncology

## 2021-04-29 ENCOUNTER — Encounter: Payer: Self-pay | Admitting: Internal Medicine

## 2021-04-29 ENCOUNTER — Ambulatory Visit: Payer: Medicare HMO

## 2021-04-29 ENCOUNTER — Other Ambulatory Visit: Payer: Self-pay

## 2021-04-29 ENCOUNTER — Ambulatory Visit: Payer: Medicare HMO | Admitting: Radiation Oncology

## 2021-04-29 ENCOUNTER — Inpatient Hospital Stay: Payer: Medicare HMO

## 2021-04-29 DIAGNOSIS — C3412 Malignant neoplasm of upper lobe, left bronchus or lung: Secondary | ICD-10-CM

## 2021-04-29 NOTE — Progress Notes (Signed)
Met with patient at registration to introduce myself as Financial Resource Specialist and to offer available resources.  Discussed one-time $1000 [employer] grant and qualifications to assist with personal expenses while going through treatment.  Gave her my card if interested in applying and for any additional financial questions or concerns. 

## 2021-04-30 MED FILL — Dexamethasone Sodium Phosphate Inj 100 MG/10ML: INTRAMUSCULAR | Qty: 1 | Status: AC

## 2021-05-01 NOTE — Progress Notes (Signed)
[name] OFFICE PROGRESS NOTE  Marie Noe, MD [place] [address]  DIAGNOSIS: stage IV (T1c, N1, M1c) non-small cell lung cancer, adenocarcinoma presented with locally advanced disease in the left lung  with a left upper lobe lung nodule in addition to left hilar adenopathy and a solitary left frontal brain metastasis s/p resection of the brain tumor with SRS.  This was diagnosed in July 2022.  Biomarker Findings Tumor Mutational Burden - 43 Muts/Mb Microsatellite status - MS-Stable Genomic Findings For a complete list of the genes assayed, please refer to the Appendix. EGFR L861Q MTAP loss CDKN2A/B CDKN2A loss, CDKN2B loss CUL4A amplification - equivocal? IRS2 amplification SF3B1 K666N TERT promoter -124C>T TP53 N247I 7 Disease relevant genes with no reportable alterations: ALK, BRAF, ERBB2, KRAS, MET, RET, ROS1  PDL1 Expression 95%  PRIOR THERAPY: Resection followed by [facility] of the solitary frontal lobe brain metastasis on July 28th, 2022   CURRENT THERAPY:  Targeted treatment with Tagrisso 80 mg p.o. daily. Expected to start in the next few days.   INTERVAL HISTORY: Marie Hensley 75 y.o. female returns to the clinic today for a follow up visit accompanied by her daughter, Marie Hensley. She is feeling well today without any concerning complaints. She was recently diagnosed with lung cancer. Although she has metastatic advanced disease, she is status post resection and SRS to the solitary brain metastasis. She has locally advanced disease in the chest. Therefore, Dr. Julien Nordmann gave the patient the option of standard palliative systemic chemotherapy plus/minus immunotherapy versus aggressive approach with concurrent chemoradiation for the locally advanced disease in the chest since the solitary brain metastasis has been resected, unless she had a targeted mutation. They had opted to proceed with concurrent chemo/radiation; however, she was found to  have EGFR mutation.  Therefore, the patient's radiation and chemotherapy appointments were canceled until she can be seen today for further evaluation discussion about the recommended treatment option.  Overall, the patient is feeling well today.  Denies any fever, chills, night sweats, or weight loss. She reports a good appetite. Denies any chest pain, shortness of breath, cough, or hemoptysis. Denies any nausea, vomiting, diarrhea, or constipation. Denies any headache or visual changes. She continues to have some slurred speech/trouble with word finding but it is slowly improving. The patient is here today for evaluation and for a more detailed discussion about her current condition and recommended treatment plan.   MEDICAL HISTORY: Past Medical History:  Diagnosis Date   Arthritis    Brain tumor ([place])    Depression    Dyspnea    WITH EXERTION-SEEING DR [name]   HTN (hypertension)    Hyperlipemia    Seizures ([facility])     ALLERGIES:  has No Known Allergies.  MEDICATIONS:  Current Outpatient Medications  Medication Sig Dispense Refill   acetaminophen (TYLENOL) 500 MG tablet Take 1,000 mg by mouth every 6 (six) hours as needed for mild pain (or arthritis). (Patient not taking: Reported on 04/22/2021)     atorvastatin (LIPITOR) 40 MG tablet Take 1 tablet (40 mg total) by mouth at bedtime. 90 tablet 3   cetirizine (ZYRTEC) 10 MG tablet Take 10 mg by mouth daily as needed for allergies or rhinitis. (Patient not taking: Reported on 04/22/2021)     citalopram (CELEXA) 20 MG tablet Take 1 tablet (20 mg total) by mouth at bedtime. 90 tablet 1   dexamethasone (DECADRON) 4 MG tablet Take 4 mg by mouth 2 (two) times  daily with a meal. 1/2 tablet /day x 2 days     fluticasone (FLONASE) 50 MCG/ACT nasal spray Place 1 spray into both nostrils daily as needed for allergies. (Patient not taking: Reported on 04/22/2021)     HYDROcodone-acetaminophen (NORCO/VICODIN) 5-325 MG tablet Take 1 tablet by mouth every  4 (four) hours as needed for moderate pain. (Patient not taking: Reported on 04/22/2021) 30 tablet 0   levETIRAcetam (KEPPRA) 500 MG tablet TAKE ONE TABLET BY MOUTH TWICE A DAY 60 tablet 0   lisinopril-hydrochlorothiazide (ZESTORETIC) 10-12.5 MG tablet Take 1 tablet by mouth daily. (Patient not taking: Reported on 04/22/2021) 90 tablet 3   loperamide (IMODIUM A-D) 2 MG tablet Take 2 mg by mouth 2 (two) times daily as needed for diarrhea or loose stools. (Patient not taking: Reported on 04/22/2021)     prochlorperazine (COMPAZINE) 10 MG tablet Take 1 tablet (10 mg total) by mouth every 6 (six) hours as needed for nausea or vomiting. 30 tablet 0   propranolol ER (INDERAL LA) 60 MG 24 hr capsule Take 1 capsule (60 mg total) by mouth at bedtime. (Patient not taking: Reported on 04/22/2021) 90 capsule 3   No current facility-administered medications for this visit.    SURGICAL HISTORY:  Past Surgical History:  Procedure Laterality Date   APPENDECTOMY  2637   APPLICATION OF CRANIAL NAVIGATION N/A 04/08/2021   Procedure: APPLICATION OF CRANIAL NAVIGATION;  Surgeon: Judith Part, MD;  Location: [facility];  Service: Neurosurgery;  Laterality: N/A;  RM 21   CRANIOTOMY Left 04/08/2021   Procedure: Left Craniotomy for tumor resection with brainlab;  Surgeon: Judith Part, MD;  Location: [facility];  Service: Neurosurgery;  Laterality: Left;   EYE SURGERY Bilateral    CATARACTS   OOPHORECTOMY Right    VIDEO BRONCHOSCOPY WITH ENDOBRONCHIAL NAVIGATION Left 12/23/2020   Procedure: VIDEO BRONCHOSCOPY WITH ENDOBRONCHIAL NAVIGATION;  Surgeon: Tyler Pita, MD;  Location: [facility];  Service: Pulmonary;  Laterality: Left;   VIDEO BRONCHOSCOPY WITH ENDOBRONCHIAL NAVIGATION Left 02/01/2021   Procedure: ROBOTIC ASSISTED VIDEO BRONCHOSCOPY WITH ENDOBRONCHIAL NAVIGATION;  Surgeon: Tyler Pita, MD;  Location: [facility];  Service: Pulmonary;  Laterality: Left;   VIDEO BRONCHOSCOPY WITH ENDOBRONCHIAL ULTRASOUND  Left 12/23/2020   Procedure: VIDEO BRONCHOSCOPY WITH ENDOBRONCHIAL ULTRASOUND;  Surgeon: Tyler Pita, MD;  Location: [facility];  Service: Pulmonary;  Laterality: Left;   VIDEO BRONCHOSCOPY WITH ENDOBRONCHIAL ULTRASOUND Left 02/01/2021   Procedure: ROBOTIC ASSITSTED VIDEO BRONCHOSCOPY WITH ENDOBRONCHIAL ULTRASOUND;  Surgeon: Tyler Pita, MD;  Location: [facility];  Service: Pulmonary;  Laterality: Left;    REVIEW OF SYSTEMS:   Review of Systems  Constitutional: Negative for appetite change, chills, fatigue, fever and unexpected weight change.  HENT:   Negative for mouth sores, nosebleeds, sore throat and trouble swallowing.   Eyes: Negative for eye problems and icterus.  Respiratory: Negative for cough, hemoptysis, shortness of breath and wheezing.   Cardiovascular: Negative for chest pain and leg swelling.  Gastrointestinal: Negative for abdominal pain, constipation, diarrhea, nausea and vomiting.  Genitourinary: Negative for bladder incontinence, difficulty urinating, dysuria, frequency and hematuria.   Musculoskeletal: Negative for back pain, gait problem, neck pain and neck stiffness.  Skin: Negative for itching and rash.  Neurological: Positive for trouble with word finding. Negative for dizziness, extremity weakness, gait problem, headaches, light-headedness and seizures.  Hematological: Negative for adenopathy. Does not bruise/bleed easily.  Psychiatric/Behavioral: Negative for confusion, depression and sleep disturbance. The patient is not nervous/anxious.  PHYSICAL EXAMINATION:  Blood pressure (!) 143/83, pulse 65, temperature (!) 97.1 F (36.2 C), temperature source Tympanic, resp. rate 18, weight 160 lb 3 oz (72.7 kg), SpO2 99 %.  ECOG PERFORMANCE STATUS: 1  Physical Exam  Constitutional: Oriented to person, place, and time and well-developed, well-nourished, and in no distress.  HENT:  Head: Normocephalic and atraumatic.  Mouth/Throat: Oropharynx is clear and  moist. No oropharyngeal exudate.  Eyes: Conjunctivae are normal. Right eye exhibits no discharge. Left eye exhibits no discharge. No scleral icterus.  Neck: Normal range of motion. Neck supple.  Cardiovascular: Normal rate, regular rhythm, normal heart sounds and intact distal pulses.   Pulmonary/Chest: Effort normal and breath sounds normal. No respiratory distress. No wheezes. No rales.  Abdominal: Soft. Bowel sounds are normal. Exhibits no distension and no mass. There is no tenderness.  Musculoskeletal: Normal range of motion. Exhibits no edema.  Lymphadenopathy:    No cervical adenopathy.  Neurological: Alert and oriented to person, place, and time. Exhibits normal muscle tone. Gait normal. Coordination normal.  Skin: Skin is warm and dry. No rash noted. Not diaphoretic. No erythema. No pallor.  Psychiatric: Mood, memory and judgment normal. Positive for trouble word finding.  Vitals reviewed.  LABORATORY DATA: Lab Results  Component Value Date   WBC 6.3 05/03/2021   HGB 11.6 (L) 05/03/2021   HCT 35.2 (L) 05/03/2021   MCV 98.3 05/03/2021   PLT 176 05/03/2021      Chemistry      Component Value Date/Time   NA 138 04/22/2021 1233   K 3.3 (L) 04/22/2021 1233   CL 106 04/22/2021 1233   CO2 24 04/22/2021 1233   BUN 21 04/22/2021 1233   CREATININE 1.23 (H) 04/22/2021 1233      Component Value Date/Time   CALCIUM 8.9 04/22/2021 1233   ALKPHOS 49 04/22/2021 1233   AST 23 04/22/2021 1233   ALT 29 04/22/2021 1233   BILITOT 0.7 04/22/2021 1233       RADIOGRAPHIC STUDIES:  MR BRAIN W WO CONTRAST  Result Date: 04/09/2021 CLINICAL DATA:  Follow-up examination status post craniotomy for tumor resection. EXAM: MRI HEAD WITHOUT AND WITH CONTRAST TECHNIQUE: Multiplanar, multiecho pulse sequences of the brain and surrounding structures were obtained without and with intravenous contrast. CONTRAST:  7.75mL GADAVIST GADOBUTROL 1 MMOL/ML IV SOLN COMPARISON:  Prior MRI from 04/08/2021.  FINDINGS: Brain: Postoperative changes from interval left frontal craniotomy for resection of previously identified left frontal convexity tumor. Small extra-axial collection overlies the left cerebral convexity, measuring up to 1-2 mm in maximal thickness without significant mass effect. Scattered postoperative blood products with a probable few small foci of pneumocephalus noted about the resection cavity. Previously seen tumor has been resected. Small amount of smudgy enhancement along the inferior aspect of the resection cavity noted (series 19, image 15). Area of enhancement measures approximately 8 mm in size. Finding is indeterminate, and could reflect postoperative changes, although a small amount of residual tumor is not excluded. Dural thickening and enhancement overlying the left cerebral convexity felt to be most consistent with normal expected postoperative change. Triangular focus of restricted diffusion seen extending from the resection cavity into the underlying left frontal centrum semi ovale, consistent with a small peri resection infarct (series 5, image 86). Vasogenic edema within the adjacent left cerebral hemispheres relatively similar to preoperative exam as is mild 4 mm of left-to-right shift. Remainder the brain is otherwise stable in appearance with no new finding or other abnormality identified. Vascular: Major  intracranial vascular flow voids are maintained. Skull and upper cervical spine: Craniocervical junction within normal limits. Bone marrow signal intensity normal. Post craniotomy changes present at the left frontal calvarium and scalp. Sinuses/Orbits: Globes and orbital soft tissues demonstrate no acute finding. Paranasal sinuses remain largely clear. No mastoid effusion. Other: None. IMPRESSION: 1. Postoperative changes from interval left frontal craniotomy for tumor resection. 8 mm focus of enhancement along the inferior aspect of the resection cavity, indeterminate, and could be  postoperative, although a small amount of residual tumor is not excluded. Attention at follow-up recommended. 2. Triangular focus of restricted diffusion extending from the resection cavity into the underlying left frontal centrum semi ovale, consistent with a small peri resection infarct. 3. Similar vasogenic edema within the adjacent left cerebral hemispheres with associated 4 mm of left-to-right shift. Electronically Signed   By: Jeannine Boga M.D.   On: 04/09/2021 05:17   MR Brain W and Wo Contrast  Result Date: 04/08/2021 CLINICAL DATA:  Initial evaluation for acute aphasia, neuro deficit. EXAM: MRI HEAD WITHOUT AND WITH CONTRAST TECHNIQUE: Multiplanar, multiecho pulse sequences of the brain and surrounding structures were obtained without and with intravenous contrast. CONTRAST:  7.55mL GADAVIST GADOBUTROL 1 MMOL/ML IV SOLN COMPARISON:  Recent MRI from 04/01/2021. FINDINGS: Brain: Mild age-related cerebral and cerebellar atrophy, stable. Known solitary metastasis again seen at the peripheral left frontal convexity, slightly increased in size on today's exam now measuring 2.3 x 1.7 x 1.9 cm, previously 2.1 x 1.5 x 1.6 cm when measured in a similar fashion. Margins of the lesion are somewhat more ill-defined as compared to prior. Overlying mild dural thickening and enhancement noted, similar. Associated vasogenic edema throughout the adjacent left frontal parietal region with up to 4 mm of left-to-right shift, similar to prior. No other new metastatic lesions or abnormal enhancement seen elsewhere within the brain. No abnormal foci of restricted diffusion to suggest acute or subacute ischemia. Gray-white matter differentiation otherwise maintained. No areas of chronic cortical infarction. No significant cerebral white matter disease for age. No evidence for acute or chronic intracranial hemorrhage. No other mass lesion. No hydrocephalus or extra-axial fluid collection. Pituitary gland and suprasellar  region normal. Vascular: Major intracranial vascular flow voids are maintained. Skull and upper cervical spine: Craniocervical junction within normal limits. Bone marrow signal intensity normal. No focal marrow replacing lesion. Hyperostosis frontalis interna noted. No scalp soft tissue abnormality. Sinuses/Orbits: Prior bilateral ocular lens replacement. Globes and orbital soft tissues demonstrate no acute finding. Paranasal sinuses are largely clear. No mastoid effusion. Inner ear structures grossly normal. Other: None. IMPRESSION: 1. Slight interval increase in size of left frontal convexity metastasis as compared to MRI from 04/01/2021. Associated vasogenic edema and 4 mm of left-to-right shift is otherwise relatively stable. No new lesions identified. 2. Otherwise essentially negative brain MRI for age. No other acute intracranial abnormality. Electronically Signed   By: Jeannine Boga M.D.   On: 04/08/2021 01:23   DG Chest Portable 1 View  Result Date: 04/07/2021 CLINICAL DATA:  Aphasia.  Seizures. EXAM: PORTABLE CHEST 1 VIEW COMPARISON:  Chest radiograph 02/01/2021,, [place] [address] FINDINGS: Lung volumes are low. Upper normal heart size likely accentuated by low lung volumes. Aortic atherosclerosis. Known left upper lobe nodule is faintly visualized. Minimal atelectasis in the left lung base. No acute airspace disease. No pneumothorax or large pleural effusion. No acute osseous abnormalities are seen. IMPRESSION: 1. Low lung volumes with left basilar atelectasis. 2. Known left upper lobe nodule is faintly visualized. Electronically Signed  By: Keith Rake M.D.   On: 04/07/2021 20:00     ASSESSMENT/PLAN:  This is a very pleasant 74 years old white female recently diagnosed with stage IV (T1c, N1, M1c) non-small cell lung cancer, adenocarcinoma presented with locally advanced disease in the left lung with a left upper lobe lung nodule in addition to left hilar adenopathy and solitary left frontal  brain metastasis s/p resection of the brain tumor with SRS in July 2022.  This was diagnosed in July 2022. Her molecular studies show she is positive for EGFR mutation L861Q.  The patient was seen with Dr. Julien Nordmann.  Given the EGFR mutation, Dr. Julien Nordmann recommends targeted treatment with Tagrisso 80 mg p.o. daily.  She is expected to start the first dose of treatment in the next few days.   We will arrange for the patient to meet with the oral chemotherapy pharmacist for education today.  The adverse side effects of treatment were discussed including but not limited to diarrhea, skin rash, myelosuppression, and pneumonitis.  She had a baseline EKG performed while in the hospital in July 2022 which showed a normal QTC at 415 ms and a QTC of 439 ms.  We will see her back for follow-up visit in 3 weeks for evaluation and repeat blood work.  The patient was advised to call immediately if she has any concerning symptoms in the interval. The patient voices understanding of current disease status and treatment options and is in agreement with the current care plan. All questions were answered. The patient knows to call the clinic with any problems, questions or concerns. We can certainly see the patient much sooner if necessary    Orders Placed This Encounter  Procedures   CMP ([place] only)    Standing Status:   Future    Standing Expiration Date:   05/03/2022   CBC with Differential ([place] [place] Only)    Standing Status:   Future    Standing Expiration Date:   05/03/2022      Tobe Sos Tavia Stave, PA-C 05/03/21  ADDENDUM: Hematology/Oncology Attending: I had a face-to-face encounter with the patient today.  I reviewed her record, lab and scan and recommended her care plan.  This is a very pleasant 75 years old white female recently diagnosed with a stage IV (T1c, N1, M1 C) non-small cell lung cancer, adenocarcinoma presented with locally advanced disease in the left lung as  well as left upper lobe nodule with left hilar adenopathy and solitary left frontal brain metastasis.  She is status post SRS with craniotomy and resection of the brain tumor. Molecular studies showed positive EGFR mutation in exon 21 (L861Q). She also had PD-L1 expression of 95%. I had a lengthy discussion with the patient and her daughter today about her current condition and treatment options. I recommended for the patient treatment with oral targeted therapy with Tagrisso 80 mg p.o. daily because of the identification of actionable mutation. I discussed with the patient the adverse effect of this treatment including but not limited to skin rash, diarrhea, interstitial lung disease, liver, renal dysfunction. The patient is interested in this treatment and she will be seen by the pharmacist for oral oncolytic for discussion and education about Tagrisso and also to help The patient obtaining the authorization for her treatment. We will see her back for follow-up visit in around 2-3 weeks for evaluation and management of any adverse effect of her treatment. The patient was advised to call immediately if she has any concerning  symptoms in the interval. The total time spent in the appointment was 35 minutes. Disclaimer: This note was dictated with voice recognition software. Similar sounding words can inadvertently be transcribed and may be missed upon review. Eilleen Kempf, MD 05/03/21

## 2021-05-03 ENCOUNTER — Inpatient Hospital Stay: Payer: Medicare HMO | Admitting: Physician Assistant

## 2021-05-03 ENCOUNTER — Other Ambulatory Visit: Payer: Self-pay

## 2021-05-03 ENCOUNTER — Inpatient Hospital Stay: Payer: Medicare HMO

## 2021-05-03 ENCOUNTER — Encounter (HOSPITAL_COMMUNITY): Payer: Self-pay | Admitting: Internal Medicine

## 2021-05-03 ENCOUNTER — Encounter: Payer: Self-pay | Admitting: Internal Medicine

## 2021-05-03 ENCOUNTER — Telehealth: Payer: Self-pay

## 2021-05-03 ENCOUNTER — Encounter (HOSPITAL_COMMUNITY): Payer: Self-pay

## 2021-05-03 ENCOUNTER — Other Ambulatory Visit: Payer: Self-pay | Admitting: Internal Medicine

## 2021-05-03 ENCOUNTER — Telehealth: Payer: Self-pay | Admitting: Pharmacist

## 2021-05-03 ENCOUNTER — Other Ambulatory Visit (HOSPITAL_COMMUNITY): Payer: Self-pay

## 2021-05-03 VITALS — BP 143/83 | HR 65 | Temp 97.1°F | Resp 18 | Wt 160.2 lb

## 2021-05-03 DIAGNOSIS — I10 Essential (primary) hypertension: Secondary | ICD-10-CM | POA: Diagnosis not present

## 2021-05-03 DIAGNOSIS — Z7189 Other specified counseling: Secondary | ICD-10-CM

## 2021-05-03 DIAGNOSIS — C3412 Malignant neoplasm of upper lobe, left bronchus or lung: Secondary | ICD-10-CM

## 2021-05-03 DIAGNOSIS — Z5111 Encounter for antineoplastic chemotherapy: Secondary | ICD-10-CM

## 2021-05-03 DIAGNOSIS — Z87891 Personal history of nicotine dependence: Secondary | ICD-10-CM | POA: Diagnosis not present

## 2021-05-03 DIAGNOSIS — C7931 Secondary malignant neoplasm of brain: Secondary | ICD-10-CM

## 2021-05-03 DIAGNOSIS — C3411 Malignant neoplasm of upper lobe, right bronchus or lung: Secondary | ICD-10-CM | POA: Diagnosis not present

## 2021-05-03 DIAGNOSIS — R69 Illness, unspecified: Secondary | ICD-10-CM | POA: Diagnosis not present

## 2021-05-03 DIAGNOSIS — Z79899 Other long term (current) drug therapy: Secondary | ICD-10-CM | POA: Diagnosis not present

## 2021-05-03 DIAGNOSIS — R569 Unspecified convulsions: Secondary | ICD-10-CM | POA: Diagnosis not present

## 2021-05-03 DIAGNOSIS — E785 Hyperlipidemia, unspecified: Secondary | ICD-10-CM | POA: Diagnosis not present

## 2021-05-03 DIAGNOSIS — M1990 Unspecified osteoarthritis, unspecified site: Secondary | ICD-10-CM | POA: Diagnosis not present

## 2021-05-03 LAB — CBC WITH DIFFERENTIAL (CANCER CENTER ONLY)
Abs Immature Granulocytes: 0.03 10*3/uL (ref 0.00–0.07)
Basophils Absolute: 0 10*3/uL (ref 0.0–0.1)
Basophils Relative: 1 %
Eosinophils Absolute: 0.3 10*3/uL (ref 0.0–0.5)
Eosinophils Relative: 5 %
HCT: 35.2 % — ABNORMAL LOW (ref 36.0–46.0)
Hemoglobin: 11.6 g/dL — ABNORMAL LOW (ref 12.0–15.0)
Immature Granulocytes: 1 %
Lymphocytes Relative: 34 %
Lymphs Abs: 2.1 10*3/uL (ref 0.7–4.0)
MCH: 32.4 pg (ref 26.0–34.0)
MCHC: 33 g/dL (ref 30.0–36.0)
MCV: 98.3 fL (ref 80.0–100.0)
Monocytes Absolute: 0.6 10*3/uL (ref 0.1–1.0)
Monocytes Relative: 9 %
Neutro Abs: 3.2 10*3/uL (ref 1.7–7.7)
Neutrophils Relative %: 50 %
Platelet Count: 176 10*3/uL (ref 150–400)
RBC: 3.58 MIL/uL — ABNORMAL LOW (ref 3.87–5.11)
RDW: 13.4 % (ref 11.5–15.5)
WBC Count: 6.3 10*3/uL (ref 4.0–10.5)
nRBC: 0 % (ref 0.0–0.2)

## 2021-05-03 LAB — CMP (CANCER CENTER ONLY)
ALT: 24 U/L (ref 0–44)
AST: 22 U/L (ref 15–41)
Albumin: 3.6 g/dL (ref 3.5–5.0)
Alkaline Phosphatase: 54 U/L (ref 38–126)
Anion gap: 9 (ref 5–15)
BUN: 18 mg/dL (ref 8–23)
CO2: 24 mmol/L (ref 22–32)
Calcium: 9.5 mg/dL (ref 8.9–10.3)
Chloride: 110 mmol/L (ref 98–111)
Creatinine: 1.11 mg/dL — ABNORMAL HIGH (ref 0.44–1.00)
GFR, Estimated: 52 mL/min — ABNORMAL LOW (ref >60)
Glucose, Bld: 104 mg/dL — ABNORMAL HIGH (ref 70–99)
Potassium: 4 mmol/L (ref 3.5–5.1)
Sodium: 143 mmol/L (ref 135–145)
Total Bilirubin: 0.7 mg/dL (ref 0.3–1.2)
Total Protein: 6.7 g/dL (ref 6.5–8.1)

## 2021-05-03 MED ORDER — OSIMERTINIB MESYLATE 80 MG PO TABS
80.0000 mg | ORAL_TABLET | Freq: Every day | ORAL | 3 refills | Status: DC
  Filled 2021-05-03: qty 30, 30d supply, fill #0

## 2021-05-03 NOTE — Telephone Encounter (Addendum)
Oral Oncology Pharmacist Encounter  Received new prescription for Tagrisso (osimertinib) for the treatment of stage IV non-small cell lung cancer (EGRR mutation L861Q positive), planned duration until disease progression or unacceptable drug toxicity.  Prescription dose and frequency assessed for appropriateness. Appropriate for therapy initiation.   CBC w/ Diff and CMP from 05/03/21 assessed, no baseline dose adjustments required. EKG performed 04/07/21, Qtc at that time 439 ms.   Current medication list in Epic reviewed, DDIs with Tagrisso identified: Category C DDI between [place] and Atorvastatin due to risk of increased serum concentrations of atorvastatin through BCRP/ABCG2 inhibition. Discussed with patient monitoring for side effects of atorvastatin including but not limited to increased myalgias and dark colored urine.   Evaluated chart and no patient barriers to medication adherence noted.   Patient agreement for treatment documented in MD note on 05/03/21.  Prescription has been e-scribed to the [facility] for benefits analysis and approval. Due to patient's high copay [phone]) will proceed with applying for manufacturer assistance at this time through [employer].  Oral Oncology Clinic will continue to follow for insurance authorization, copayment issues, initial counseling and start date.  Leron Croak, PharmD, BCPS Hematology/Oncology Clinical Pharmacist [facility] [phone] 05/03/2021 12:28 PM

## 2021-05-03 NOTE — Telephone Encounter (Signed)
Oral Oncology Patient Advocate Encounter  Prior Authorization for [name] has been approved.    PA# [id number] Effective dates: 09/12/20 through 09/11/21  Patients co-pay is $3266  Oral Oncology Clinic will continue to follow.    [place] Patient [place] Phone [phone] Fax [phone] 05/03/2021 2:45 PM

## 2021-05-03 NOTE — Progress Notes (Signed)
DISCONTINUE OFF PATHWAY REGIMEN - Non-Small Cell Lung   [id number]:Carboplatin AUC=2 + Paclitaxel 45 mg/m2 Weekly:   Administer weekly:     Paclitaxel      Carboplatin   **Always confirm dose/schedule in your pharmacy ordering system**  REASON: Other Reason PRIOR TREATMENT: Off Pathway: Carboplatin AUC=2 + Paclitaxel 45 mg/m2 Weekly TREATMENT RESPONSE: Unable to Evaluate  START OFF PATHWAY REGIMEN - Non-Small Cell Lung   [id number]:Osimertinib 80 mg PO Daily D1-28 q28 Days:   A cycle is every 28 days:     Osimertinib   **Always confirm dose/schedule in your pharmacy ordering system**  Patient Characteristics: Stage IV Metastatic, Nonsquamous, Molecular Analysis Completed, Molecular Alteration Present and Eligible for Molecular Targeted Therapy, Initial Molecular Targeted Therapy, EGFR Mutation -  Uncommon (S768I, M086P, and G719X) Therapeutic Status: Stage IV Metastatic Histology: Nonsquamous Cell Broad Molecular Profiling Status: Molecular Analysis Completed Molecular Analysis Results: Alteration Present and Eligible for Molecular Targeted Therapy Molecular Alteration Present: EGFR Mutation - Uncommon (S768I, L861Q, and G719X) Molecular Targeted Line of Therapy: Initial Molecular Targeted Therapy Intent of Therapy: Non-Curative / Palliative Intent, Discussed with Patient

## 2021-05-03 NOTE — Telephone Encounter (Signed)
Oral Oncology Patient Advocate Encounter   Received notification from [facility] that prior authorization for Tagrisso is required.   PA submitted on CoverMyMeds Key [id number] Status is pending   Oral Oncology Clinic will continue to follow.   [place] Patient [place] Phone [phone] Fax [phone] 05/03/2021 12:42 PM

## 2021-05-03 NOTE — Telephone Encounter (Addendum)
Oral Chemotherapy Pharmacist Encounter  I met with patient and patient's daughter for overview of: Tagrisso for the treatment of metastatic, EGFR mutation-positive (L861Q) NSCLC, planned duration until disease progression or unacceptable toxicity.   Counseled patient on administration, dosing, side effects, monitoring, drug-food interactions, safe handling, storage, and disposal.  Patient will take Tagrisso 80 tablets, 1 tablet by mouth once daily, without regard to food.  Tagrisso start date: medication processed 05/18/21 through [place], expected to deliver to patient in 2-3 business days (05/20/21-05/21/21) for patient to start therapy.   Adverse effects include but are not limited to: diarrhea, mouth sores, decreased appetitie, fatigue, dry skin, rash, nail changes, altered cardiac conduction, and decreased blood counts or electrolytes.   Patient will obtain anti diarrheal and alert the office of 4 or more loose stools above baseline.   Reviewed with patient importance of keeping a medication schedule and plan for any missed doses. No barriers to medication adherence identified.  Medication reconciliation performed and medication/allergy list updated.  All questions answered.  Ms. Gorin voiced understanding and appreciation.   Medication education handout given to patient. Patient knows to call the office with questions or concerns. Oral Chemotherapy Clinic phone number provided to patient.   Leron Croak, PharmD, BCPS Hematology/Oncology Clinical Pharmacist [facility] [phone] 05/03/2021 12:31 PM

## 2021-05-04 ENCOUNTER — Telehealth: Payer: Self-pay

## 2021-05-04 NOTE — Telephone Encounter (Signed)
Oral Oncology Patient Advocate Encounter  Met patient in [place] to complete [place] in an effort to reduce the patient's out of pocket expense for Tagrisso to $0.    Application completed and faxed to [phone].   [place] patient [place] phone number for follow up is [phone].   This encounter will be updated until final determination.  [facility] Patient [place] Phone [phone] Fax [phone] 05/04/2021 2:01 PM

## 2021-05-06 ENCOUNTER — Ambulatory Visit: Payer: Medicare HMO

## 2021-05-07 ENCOUNTER — Inpatient Hospital Stay: Payer: Medicare HMO | Admitting: *Deleted

## 2021-05-07 ENCOUNTER — Ambulatory Visit: Payer: Medicare HMO

## 2021-05-10 ENCOUNTER — Ambulatory Visit: Payer: Medicare HMO

## 2021-05-10 ENCOUNTER — Inpatient Hospital Stay: Payer: Medicare HMO | Admitting: Internal Medicine

## 2021-05-10 ENCOUNTER — Inpatient Hospital Stay: Payer: Medicare HMO

## 2021-05-10 ENCOUNTER — Other Ambulatory Visit: Payer: Self-pay | Admitting: *Deleted

## 2021-05-10 DIAGNOSIS — C7931 Secondary malignant neoplasm of brain: Secondary | ICD-10-CM

## 2021-05-10 NOTE — Progress Notes (Addendum)
[facility] states they can accept the patient for nursing as early as this Thursday and pending status of Speech Therapist jury duty assignment for this week.  Will follow up with [facility] later this week with final determination.   Ronalee Belts @ [facility]  Ramond Marrow at [facility] stated that if [facility] was unable to proceed after hearing back about Madaline Savage duty status then to let her know because she can go back to [place] for assistance.

## 2021-05-11 ENCOUNTER — Inpatient Hospital Stay (HOSPITAL_BASED_OUTPATIENT_CLINIC_OR_DEPARTMENT_OTHER): Payer: Medicare HMO | Admitting: Internal Medicine

## 2021-05-11 ENCOUNTER — Ambulatory Visit: Payer: Medicare HMO

## 2021-05-11 DIAGNOSIS — C7931 Secondary malignant neoplasm of brain: Secondary | ICD-10-CM

## 2021-05-11 DIAGNOSIS — R569 Unspecified convulsions: Secondary | ICD-10-CM

## 2021-05-11 MED ORDER — DEXAMETHASONE 4 MG PO TABS: 4.0000 mg | ORAL_TABLET | Freq: Every day | ORAL | 0 refills | Status: DC

## 2021-05-11 MED ORDER — LEVETIRACETAM 500 MG PO TABS: 1000.0000 mg | ORAL_TABLET | Freq: Two times a day (BID) | ORAL | 3 refills | Status: DC

## 2021-05-11 NOTE — Progress Notes (Signed)
I connected with Marie Hensley on 05/11/21 at  2:30 PM EDT by telephone visit and verified that I am speaking with the correct person using two identifiers.  I discussed the limitations, risks, security and privacy concerns of performing an evaluation and management service by telemedicine and the availability of in-person appointments. I also discussed with the patient that there may be a patient responsible charge related to this service. The patient expressed understanding and agreed to proceed.  Other persons participating in the visit and their role in the encounter:  daughter  Patient's location:  Home  Provider's location:  Office  Chief Complaint:  Brain metastasis ([place])  Focal seizure ([place])  History of Present Ilness: Marie Hensley describes recurrence of seizures activity.  Two days ago, she had episode of speech arrest and right sided twitching, same semiology as prior event.  Leading up to it, she had been feeling tired and "not herself".  Following the seizure, she has had very slow recovery of speech, and continues to complain of increased fatigue and lethargy.  Has been dosing the Keppra 1000mg  BID compared to 500mg  BID prior. Observations: Language and cognition near baseline Assessment and Plan: Brain metastasis ([facility])  Focal seizure ([place])  Breakthrough seizure, associated with systemic symptoms which are suggestive of local inflammation post-SRS.    We recommended dosing decadron 4mg  BID x3 days, then 4mg  daily thereafter.  This is in addition to increasing Keppra to 1000mg  BID.  Follow Up Instructions: We will follow up with meds titration in 1 week via phone  I discussed the assessment and treatment plan with the patient.  The patient was provided an opportunity to ask questions and all were answered.  The patient agreed with the plan and demonstrated understanding of the instructions.    The patient was advised to call back or seek an in-person evaluation if the  symptoms worsen or if the condition fails to improve as anticipated.  I provided 5-10 minutes of non-face-to-face time during this enocunter.  Ventura Sellers, MD   I provided 15 minutes of non face-to-face telephone visit time during this encounter, and > 50% was spent counseling as documented under my assessment & plan.

## 2021-05-12 ENCOUNTER — Ambulatory Visit: Payer: Medicare HMO

## 2021-05-12 ENCOUNTER — Telehealth: Payer: Self-pay | Admitting: *Deleted

## 2021-05-12 NOTE — Telephone Encounter (Signed)
Received vm message from Harney @ [facility]. He is calling to confirm that he will be able to start pt's speech therapy and skilled nursing. Anna Genre, [place] do start of care on 05/14/21 and hopefully nursing the following week

## 2021-05-13 ENCOUNTER — Ambulatory Visit: Payer: Medicare HMO

## 2021-05-13 MED ORDER — OSIMERTINIB MESYLATE 80 MG PO TABS: 80.0000 mg | ORAL_TABLET | Freq: Every day | ORAL | 3 refills | Status: DC

## 2021-05-14 ENCOUNTER — Telehealth: Payer: Self-pay | Admitting: Family Medicine

## 2021-05-14 ENCOUNTER — Ambulatory Visit: Payer: Medicare HMO

## 2021-05-14 DIAGNOSIS — G40109 Localization-related (focal) (partial) symptomatic epilepsy and epileptic syndromes with simple partial seizures, not intractable, without status epilepticus: Secondary | ICD-10-CM | POA: Diagnosis not present

## 2021-05-14 DIAGNOSIS — M1990 Unspecified osteoarthritis, unspecified site: Secondary | ICD-10-CM | POA: Diagnosis not present

## 2021-05-14 DIAGNOSIS — R69 Illness, unspecified: Secondary | ICD-10-CM | POA: Diagnosis not present

## 2021-05-14 DIAGNOSIS — E785 Hyperlipidemia, unspecified: Secondary | ICD-10-CM | POA: Diagnosis not present

## 2021-05-14 DIAGNOSIS — R4701 Aphasia: Secondary | ICD-10-CM | POA: Diagnosis not present

## 2021-05-14 DIAGNOSIS — I10 Essential (primary) hypertension: Secondary | ICD-10-CM | POA: Diagnosis not present

## 2021-05-14 DIAGNOSIS — C3412 Malignant neoplasm of upper lobe, left bronchus or lung: Secondary | ICD-10-CM | POA: Diagnosis not present

## 2021-05-14 DIAGNOSIS — C7931 Secondary malignant neoplasm of brain: Secondary | ICD-10-CM | POA: Diagnosis not present

## 2021-05-14 NOTE — Telephone Encounter (Signed)
Left detailed VM on secure line giving verbal orders for speech therapy for once a week x 8 weeks, per Allie Bossier.

## 2021-05-14 NOTE — Telephone Encounter (Signed)
Home Health verbal orders Caller Name: [place] Name: Moreen Fowler number: [phone]  Requesting OT/PT/Skilled nursing/Social Work/Speech: speech  Reason:   Frequency: 1w8  Please forward to [facility] pool or providers CMA

## 2021-05-14 NOTE — Telephone Encounter (Signed)
Approved.  

## 2021-05-18 ENCOUNTER — Ambulatory Visit: Payer: Medicare HMO

## 2021-05-18 ENCOUNTER — Inpatient Hospital Stay: Payer: Medicare HMO | Attending: Internal Medicine | Admitting: Internal Medicine

## 2021-05-18 DIAGNOSIS — R197 Diarrhea, unspecified: Secondary | ICD-10-CM | POA: Insufficient documentation

## 2021-05-18 DIAGNOSIS — N189 Chronic kidney disease, unspecified: Secondary | ICD-10-CM | POA: Insufficient documentation

## 2021-05-18 DIAGNOSIS — C7931 Secondary malignant neoplasm of brain: Secondary | ICD-10-CM | POA: Insufficient documentation

## 2021-05-18 DIAGNOSIS — Z87891 Personal history of nicotine dependence: Secondary | ICD-10-CM | POA: Insufficient documentation

## 2021-05-18 DIAGNOSIS — C3412 Malignant neoplasm of upper lobe, left bronchus or lung: Secondary | ICD-10-CM | POA: Insufficient documentation

## 2021-05-18 DIAGNOSIS — Z79899 Other long term (current) drug therapy: Secondary | ICD-10-CM | POA: Insufficient documentation

## 2021-05-18 DIAGNOSIS — I129 Hypertensive chronic kidney disease with stage 1 through stage 4 chronic kidney disease, or unspecified chronic kidney disease: Secondary | ICD-10-CM | POA: Insufficient documentation

## 2021-05-18 DIAGNOSIS — I959 Hypotension, unspecified: Secondary | ICD-10-CM | POA: Insufficient documentation

## 2021-05-18 MED ORDER — DEXAMETHASONE 4 MG PO TABS: 2.0000 mg | ORAL_TABLET | Freq: Every day | ORAL | 0 refills | Status: DC

## 2021-05-18 NOTE — Progress Notes (Signed)
I connected with [name] on 05/18/21 at 11:00 AM EDT by telephone visit and verified that I am speaking with the correct person using two identifiers.  I discussed the limitations, risks, security and privacy concerns of performing an evaluation and management service by telemedicine and the availability of in-person appointments. I also discussed with the patient that there may be a patient responsible charge related to this service. The patient expressed understanding and agreed to proceed.  Other persons participating in the visit and their role in the encounter:  daughter  Patient's location:  Home  Provider's location:  Office  Chief Complaint:  Brain metastasis [facility])  History of Present Ilness: BEVERELY SUEN describes no further seizures since starting the decadron last week.  She also describes clear improvement in the clarity and content of her speech.  She does complain, however, of interrupted sleep and insomnia.  Has been dosing the Keppra 1000mg  BID . Observations: Language and cognition at baseline Assessment and Plan: Brain metastasis ([place])  Clinically improved on dexamethasone, with side effect of insomnia.  We recommended decreasing decadron to 2mg  daily x7-10 days, then decreasing to 1mg  for 7-10 days, then stopping if tolerated.  Will con't keppra 1000mg  BID.  Tagrisso to start soon.  Follow Up Instructions: We ask that JOLLY CARLINI return to clinic in 2 months following next brain MRI, or sooner as needed.  I discussed the assessment and treatment plan with the patient.  The patient was provided an opportunity to ask questions and all were answered.  The patient agreed with the plan and demonstrated understanding of the instructions.    The patient was advised to call back or seek an in-person evaluation if the symptoms worsen or if the condition fails to improve as anticipated.  I provided 5-10 minutes of non-face-to-face time during this  enocunter.  Ventura Sellers, MD   I provided 15 minutes of non face-to-face telephone visit time during this encounter, and > 50% was spent counseling as documented under my assessment & plan.

## 2021-05-19 ENCOUNTER — Other Ambulatory Visit: Payer: Medicare HMO

## 2021-05-19 ENCOUNTER — Ambulatory Visit: Payer: Self-pay | Admitting: Urology

## 2021-05-19 ENCOUNTER — Ambulatory Visit: Payer: Medicare HMO

## 2021-05-19 ENCOUNTER — Ambulatory Visit: Payer: Medicare HMO | Admitting: Internal Medicine

## 2021-05-20 ENCOUNTER — Ambulatory Visit: Payer: Medicare HMO

## 2021-05-21 ENCOUNTER — Ambulatory Visit: Payer: Medicare HMO

## 2021-05-21 NOTE — Progress Notes (Deleted)
[facility] OFFICE PROGRESS NOTE  [name], MD [place] [address]  DIAGNOSIS: Stage IV (T1c, N1, M1c) non-small cell lung cancer, adenocarcinoma presented with locally advanced disease in the left lung  with a left upper lobe lung nodule in addition to left hilar adenopathy and a solitary left frontal brain metastasis s/p resection of the brain tumor with SRS.  This was diagnosed in July 2022.   Biomarker Findings Tumor Mutational Burden - 43 Muts/Mb Microsatellite status - MS-Stable Genomic Findings For a complete list of the genes assayed, please refer to the Appendix. EGFR L861Q MTAP loss CDKN2A/B CDKN2A loss, CDKN2B loss CUL4A amplification - equivocal? IRS2 amplification SF3B1 K666N TERT promoter -124C>T TP53 N247I 7 Disease relevant genes with no reportable alterations: ALK, BRAF, ERBB2, KRAS, MET, RET, ROS1   PDL1 Expression 95%  PRIOR THERAPY:  Resection followed by [facility] of the solitary frontal lobe brain metastasis on July 28th, 2022   CURRENT THERAPY: Targeted treatment with Tagrisso 80 mg p.o. daily. First dose ***   INTERVAL HISTORY: Marie Hensley 75 y.o. female returns to the clinic today for a follow up visit accompanied by her daughter, Marie Hensley.  The patient was recently diagnosed with lung cancer.  She has metastatic disease to the brain in which she underwent SRS and resection to the solitary brain metastasis.  She was found to have an EGFR mutation is currently on targeted treatment with Tagrisso.  She has been on this for approximately ***and she is tolerating this well without any concerning adverse side effects.  Overall, the patient denies any recent fever, chills, night sweats, or weight loss.  She has good appetite.  She denies any chest pain, shortness of breath, cough, or hemoptysis.  She denies any nausea, vomiting, diarrhea, or constipation.  She sees neuro-oncology for her neurological concerns dated to some  slurred speech and trouble with word finding although this has improved since starting Decadron.  She is currently taking 2 mg of Decadron daily and is going to start 1 mg daily in the next few days.  She denies any rashes or skin changes.  She is here today for evaluation and repeat blood work.    MEDICAL HISTORY: Past Medical History:  Diagnosis Date   Arthritis    Brain tumor ([place])    Depression    Dyspnea    WITH EXERTION-SEEING DR [name]   HTN (hypertension)    Hyperlipemia    Seizures ([facility])     ALLERGIES:  has No Known Allergies.  MEDICATIONS:  Current Outpatient Medications  Medication Sig Dispense Refill   acetaminophen (TYLENOL) 500 MG tablet Take 1,000 mg by mouth every 6 (six) hours as needed for mild pain (or arthritis). (Patient not taking: Reported on 04/22/2021)     atorvastatin (LIPITOR) 40 MG tablet Take 1 tablet (40 mg total) by mouth at bedtime. 90 tablet 3   cetirizine (ZYRTEC) 10 MG tablet Take 10 mg by mouth daily as needed for allergies or rhinitis. (Patient not taking: Reported on 04/22/2021)     citalopram (CELEXA) 20 MG tablet Take 1 tablet (20 mg total) by mouth at bedtime. 90 tablet 1   dexamethasone (DECADRON) 4 MG tablet Take 0.5 tablets (2 mg total) by mouth daily. 30 tablet 0   fluticasone (FLONASE) 50 MCG/ACT nasal spray Place 1 spray into both nostrils daily as needed for allergies. (Patient not taking: Reported on 04/22/2021)     HYDROcodone-acetaminophen (NORCO/VICODIN) 5-325 MG tablet  Take 1 tablet by mouth every 4 (four) hours as needed for moderate pain. (Patient not taking: Reported on 04/22/2021) 30 tablet 0   levETIRAcetam (KEPPRA) 500 MG tablet Take 2 tablets (1,000 mg total) by mouth 2 (two) times daily. 120 tablet 3   lisinopril-hydrochlorothiazide (ZESTORETIC) 10-12.5 MG tablet Take 1 tablet by mouth daily. (Patient not taking: Reported on 04/22/2021) 90 tablet 3   loperamide (IMODIUM A-D) 2 MG tablet Take 2 mg by mouth 2 (two) times daily as  needed for diarrhea or loose stools. (Patient not taking: Reported on 04/22/2021)     osimertinib mesylate (TAGRISSO) 80 MG tablet Take 1 tablet (80 mg total) by mouth daily. 30 tablet 3   prochlorperazine (COMPAZINE) 10 MG tablet Take 1 tablet (10 mg total) by mouth every 6 (six) hours as needed for nausea or vomiting. 30 tablet 0   propranolol ER (INDERAL LA) 60 MG 24 hr capsule Take 1 capsule (60 mg total) by mouth at bedtime. (Patient not taking: Reported on 04/22/2021) 90 capsule 3   No current facility-administered medications for this visit.    SURGICAL HISTORY:  Past Surgical History:  Procedure Laterality Date   APPENDECTOMY  0102   APPLICATION OF CRANIAL NAVIGATION N/A 04/08/2021   Procedure: APPLICATION OF CRANIAL NAVIGATION;  Surgeon: Judith Part, MD;  Location: [facility];  Service: Neurosurgery;  Laterality: N/A;  RM 21   CRANIOTOMY Left 04/08/2021   Procedure: Left Craniotomy for tumor resection with brainlab;  Surgeon: Judith Part, MD;  Location: [facility];  Service: Neurosurgery;  Laterality: Left;   EYE SURGERY Bilateral    CATARACTS   OOPHORECTOMY Right    VIDEO BRONCHOSCOPY WITH ENDOBRONCHIAL NAVIGATION Left 12/23/2020   Procedure: VIDEO BRONCHOSCOPY WITH ENDOBRONCHIAL NAVIGATION;  Surgeon: Tyler Pita, MD;  Location: [facility];  Service: Pulmonary;  Laterality: Left;   VIDEO BRONCHOSCOPY WITH ENDOBRONCHIAL NAVIGATION Left 02/01/2021   Procedure: ROBOTIC ASSISTED VIDEO BRONCHOSCOPY WITH ENDOBRONCHIAL NAVIGATION;  Surgeon: Tyler Pita, MD;  Location: [facility];  Service: Pulmonary;  Laterality: Left;   VIDEO BRONCHOSCOPY WITH ENDOBRONCHIAL ULTRASOUND Left 12/23/2020   Procedure: VIDEO BRONCHOSCOPY WITH ENDOBRONCHIAL ULTRASOUND;  Surgeon: Tyler Pita, MD;  Location: [facility];  Service: Pulmonary;  Laterality: Left;   VIDEO BRONCHOSCOPY WITH ENDOBRONCHIAL ULTRASOUND Left 02/01/2021   Procedure: ROBOTIC ASSITSTED VIDEO BRONCHOSCOPY WITH ENDOBRONCHIAL  ULTRASOUND;  Surgeon: Tyler Pita, MD;  Location: [facility];  Service: Pulmonary;  Laterality: Left;    REVIEW OF SYSTEMS:   Review of Systems  Constitutional: Negative for appetite change, chills, fatigue, fever and unexpected weight change.  HENT:   Negative for mouth sores, nosebleeds, sore throat and trouble swallowing.   Eyes: Negative for eye problems and icterus.  Respiratory: Negative for cough, hemoptysis, shortness of breath and wheezing.   Cardiovascular: Negative for chest pain and leg swelling.  Gastrointestinal: Negative for abdominal pain, constipation, diarrhea, nausea and vomiting.  Genitourinary: Negative for bladder incontinence, difficulty urinating, dysuria, frequency and hematuria.   Musculoskeletal: Negative for back pain, gait problem, neck pain and neck stiffness.  Skin: Negative for itching and rash.  Neurological: Negative for dizziness, extremity weakness, gait problem, headaches, light-headedness and seizures.  Hematological: Negative for adenopathy. Does not bruise/bleed easily.  Psychiatric/Behavioral: Negative for confusion, depression and sleep disturbance. The patient is not nervous/anxious.     PHYSICAL EXAMINATION:  There were no vitals taken for this visit.  ECOG PERFORMANCE STATUS: {CHL ONC ECOG [id number]  Physical Exam  Constitutional: Oriented to person,  place, and time and well-developed, well-nourished, and in no distress. No distress.  HENT:  Head: Normocephalic and atraumatic.  Mouth/Throat: Oropharynx is clear and moist. No oropharyngeal exudate.  Eyes: Conjunctivae are normal. Right eye exhibits no discharge. Left eye exhibits no discharge. No scleral icterus.  Neck: Normal range of motion. Neck supple.  Cardiovascular: Normal rate, regular rhythm, normal heart sounds and intact distal pulses.   Pulmonary/Chest: Effort normal and breath sounds normal. No respiratory distress. No wheezes. No rales.  Abdominal: Soft. Bowel sounds  are normal. Exhibits no distension and no mass. There is no tenderness.  Musculoskeletal: Normal range of motion. Exhibits no edema.  Lymphadenopathy:    No cervical adenopathy.  Neurological: Alert and oriented to person, place, and time. Exhibits normal muscle tone. Gait normal. Coordination normal.  Skin: Skin is warm and dry. No rash noted. Not diaphoretic. No erythema. No pallor.  Psychiatric: Mood, memory and judgment normal.  Vitals reviewed.  LABORATORY DATA: Lab Results  Component Value Date   WBC 6.3 05/03/2021   HGB 11.6 (L) 05/03/2021   HCT 35.2 (L) 05/03/2021   MCV 98.3 05/03/2021   PLT 176 05/03/2021      Chemistry      Component Value Date/Time   NA 143 05/03/2021 1125   K 4.0 05/03/2021 1125   CL 110 05/03/2021 1125   CO2 24 05/03/2021 1125   BUN 18 05/03/2021 1125   CREATININE 1.11 (H) 05/03/2021 1125      Component Value Date/Time   CALCIUM 9.5 05/03/2021 1125   ALKPHOS 54 05/03/2021 1125   AST 22 05/03/2021 1125   ALT 24 05/03/2021 1125   BILITOT 0.7 05/03/2021 1125       RADIOGRAPHIC STUDIES:  No results found.   ASSESSMENT/PLAN:  This is a very pleasant 75 years old white female recently diagnosed with stage IV (T1c, N1, M1c) non-small cell lung cancer, adenocarcinoma presented with locally advanced disease in the left lung with a left upper lobe lung nodule in addition to left hilar adenopathy and solitary left frontal brain metastasis s/p resection of the brain tumor with SRS in July 2022.  This was diagnosed in July 2022. Her molecular studies show she is positive for EGFR mutation L861Q.   The patient is currently undergoing targeted treatment with Tagrisso 80 mg p.o. daily.  She started this on ***  The patient was seen with Dr. Julien Nordmann today.  Labs are reviewed.  Recommend that she ***on the same treatment at the same dose.  We will see her back for follow-up visit in 4 weeks for evaluation and repeat blood work.  The patient was advised  to call immediately if she has any concerning symptoms in the interval. The patient voices understanding of current disease status and treatment options and is in agreement with the current care plan. All questions were answered. The patient knows to call the clinic with any problems, questions or concerns. We can certainly see the patient much sooner if necessary      No orders of the defined types were placed in this encounter.    I spent {CHL ONC TIME VISIT - YPPJK:[id number]} counseling the patient face to face. The total time spent in the appointment was {CHL ONC TIME VISIT - KDXIP:[id number]}.  Emmersyn Kratzke L Katti Pelle, PA-C 05/21/21

## 2021-05-24 ENCOUNTER — Inpatient Hospital Stay: Payer: Medicare HMO | Admitting: Licensed Clinical Social Worker

## 2021-05-24 ENCOUNTER — Ambulatory Visit: Payer: Medicare HMO

## 2021-05-24 ENCOUNTER — Other Ambulatory Visit: Payer: Self-pay

## 2021-05-24 DIAGNOSIS — C3412 Malignant neoplasm of upper lobe, left bronchus or lung: Secondary | ICD-10-CM

## 2021-05-24 NOTE — Progress Notes (Signed)
[facility] Social Work  Clinical Social Work was referred to review and complete healthcare advance directives.  Clinical Social Worker/ chaplain met with patient in office.  The patient designated Sofie Rower as their primary healthcare agent and Harumi Yamin as their secondary agent.  Patient also completed healthcare living will.    Clinical Social Worker notarized documents and made copies for patient/family. Clinical Social Worker will send documents to medical records to be scanned into patient's chart. Clinical Social Worker encouraged patient/family to contact with any additional questions or concerns.    [place] [phone]

## 2021-05-25 ENCOUNTER — Inpatient Hospital Stay: Payer: Medicare HMO

## 2021-05-25 ENCOUNTER — Ambulatory Visit: Payer: Medicare HMO

## 2021-05-25 ENCOUNTER — Inpatient Hospital Stay: Payer: Medicare HMO | Admitting: Physician Assistant

## 2021-05-25 DIAGNOSIS — R197 Diarrhea, unspecified: Secondary | ICD-10-CM | POA: Diagnosis not present

## 2021-05-25 DIAGNOSIS — C7931 Secondary malignant neoplasm of brain: Secondary | ICD-10-CM | POA: Diagnosis not present

## 2021-05-25 DIAGNOSIS — Z79899 Other long term (current) drug therapy: Secondary | ICD-10-CM | POA: Diagnosis not present

## 2021-05-25 DIAGNOSIS — C3412 Malignant neoplasm of upper lobe, left bronchus or lung: Secondary | ICD-10-CM | POA: Diagnosis not present

## 2021-05-25 DIAGNOSIS — N189 Chronic kidney disease, unspecified: Secondary | ICD-10-CM | POA: Diagnosis not present

## 2021-05-25 DIAGNOSIS — I129 Hypertensive chronic kidney disease with stage 1 through stage 4 chronic kidney disease, or unspecified chronic kidney disease: Secondary | ICD-10-CM | POA: Diagnosis not present

## 2021-05-25 DIAGNOSIS — Z87891 Personal history of nicotine dependence: Secondary | ICD-10-CM | POA: Diagnosis not present

## 2021-05-25 DIAGNOSIS — I959 Hypotension, unspecified: Secondary | ICD-10-CM | POA: Diagnosis not present

## 2021-05-25 LAB — CBC WITH DIFFERENTIAL (CANCER CENTER ONLY)
Abs Immature Granulocytes: 0.29 10*3/uL — ABNORMAL HIGH (ref 0.00–0.07)
Basophils Absolute: 0 10*3/uL (ref 0.0–0.1)
Basophils Relative: 0 %
Eosinophils Absolute: 0.1 10*3/uL (ref 0.0–0.5)
Eosinophils Relative: 1 %
HCT: 35 % — ABNORMAL LOW (ref 36.0–46.0)
Hemoglobin: 11.9 g/dL — ABNORMAL LOW (ref 12.0–15.0)
Immature Granulocytes: 2 %
Lymphocytes Relative: 12 %
Lymphs Abs: 1.7 10*3/uL (ref 0.7–4.0)
MCH: 32.6 pg (ref 26.0–34.0)
MCHC: 34 g/dL (ref 30.0–36.0)
MCV: 95.9 fL (ref 80.0–100.0)
Monocytes Absolute: 0.5 10*3/uL (ref 0.1–1.0)
Monocytes Relative: 4 %
Neutro Abs: 12.2 10*3/uL — ABNORMAL HIGH (ref 1.7–7.7)
Neutrophils Relative %: 81 %
Platelet Count: 201 10*3/uL (ref 150–400)
RBC: 3.65 MIL/uL — ABNORMAL LOW (ref 3.87–5.11)
RDW: 12.8 % (ref 11.5–15.5)
WBC Count: 14.9 10*3/uL — ABNORMAL HIGH (ref 4.0–10.5)
nRBC: 0 % (ref 0.0–0.2)

## 2021-05-25 LAB — CMP (CANCER CENTER ONLY)
ALT: 27 U/L (ref 0–44)
AST: 17 U/L (ref 15–41)
Albumin: 3.7 g/dL (ref 3.5–5.0)
Alkaline Phosphatase: 48 U/L (ref 38–126)
Anion gap: 10 (ref 5–15)
BUN: 35 mg/dL — ABNORMAL HIGH (ref 8–23)
CO2: 23 mmol/L (ref 22–32)
Calcium: 9.6 mg/dL (ref 8.9–10.3)
Chloride: 103 mmol/L (ref 98–111)
Creatinine: 1.49 mg/dL — ABNORMAL HIGH (ref 0.44–1.00)
GFR, Estimated: 36 mL/min — ABNORMAL LOW (ref >60)
Glucose, Bld: 176 mg/dL — ABNORMAL HIGH (ref 70–99)
Potassium: 3.8 mmol/L (ref 3.5–5.1)
Sodium: 136 mmol/L (ref 135–145)
Total Bilirubin: 0.5 mg/dL (ref 0.3–1.2)
Total Protein: 6.6 g/dL (ref 6.5–8.1)

## 2021-05-26 ENCOUNTER — Ambulatory Visit: Payer: Medicare HMO

## 2021-05-27 ENCOUNTER — Ambulatory Visit: Payer: Medicare HMO

## 2021-05-28 ENCOUNTER — Ambulatory Visit: Payer: Medicare HMO

## 2021-05-31 ENCOUNTER — Ambulatory Visit: Payer: Medicare HMO

## 2021-06-01 ENCOUNTER — Ambulatory Visit: Payer: Medicare HMO

## 2021-06-01 ENCOUNTER — Telehealth: Payer: Self-pay | Admitting: Physician Assistant

## 2021-06-01 NOTE — Telephone Encounter (Signed)
Sch per 9/14 los ,pt aware

## 2021-06-02 ENCOUNTER — Ambulatory Visit: Payer: Medicare HMO

## 2021-06-03 ENCOUNTER — Ambulatory Visit: Payer: Medicare HMO

## 2021-06-03 NOTE — Progress Notes (Signed)
[place] OFFICE PROGRESS NOTE  [name], MD [place] [address]  DIAGNOSIS: : Stage IV (T1c, N1, M1c) non-small cell lung cancer, adenocarcinoma presented with locally advanced disease in the left lung  with a left upper lobe lung nodule in addition to left hilar adenopathy and a solitary left frontal brain metastasis s/p resection of the brain tumor with SRS.  This was diagnosed in July 2022.   Biomarker Findings Tumor Mutational Burden - 43 Muts/Mb Microsatellite status - MS-Stable Genomic Findings For a complete list of the genes assayed, please refer to the Appendix. EGFR L861Q MTAP loss CDKN2A/B CDKN2A loss, CDKN2B loss CUL4A amplification - equivocal? IRS2 amplification SF3B1 K666N TERT promoter -124C>T TP53 N247I 7 Disease relevant genes with no reportable alterations: ALK, BRAF, ERBB2, KRAS, MET, RET, ROS1   PDL1 Expression 95%  PRIOR THERAPY:  Resection followed by [facility] of the solitary frontal lobe brain metastasis on July 28th, 2022   CURRENT THERAPY: Targeted treatment with Tagrisso 80 mg p.o. daily. First dose 05/27/21.  INTERVAL HISTORY: Marie Hensley 75 y.o. female returns to the clinic today for a follow up visit. The patient was recently diagnosed with lung cancer.  She has metastatic disease to the brain in which she underwent SRS and resection to the solitary brain metastasis.  She was found to have an EGFR mutation is currently on targeted treatment with Tagrisso.  She has been on this for approximately 2 weeks and she is tolerating this well without any concerning adverse side effects except for manageable diarrhea. She states the diarrhea is intermittent. Some days she may have 3-4 bouts and other days none. She did state that imodium does help when she takes it. Her last bowel movement was this morning and was of looser consistency. She denies abdominal pain or blood in the stool. She is drinking 6 glasses of water a  day to stay hydrated as well as gatorade and body armour drinks. She does have some baseline kidney dysfunction. She recently resumed her propanolol for her history of essential tremors.   Overall, the patient denies any recent fever, chills, night sweats, or significant weight loss.  She has good appetite.  She denies any chest pain, cough, or hemoptysis. She has some dyspnea on exertion but states it is unchanged. She denies any nausea, vomiting, or constipation.  She sees neuro-oncology for her neurological concerns related to some slurred speech and trouble with word finding which continues to be a concern which frustrates her. She is performing speech therapy 2x per week. She is asking about Dr. Renda Rolls instructions regarding her decadron taper. She is still taking 1 mg per day which sometimes affects her ability to sleep. She is compliant with taking her keppra 1000 mg BID. She is scheduled for a repeat brain MRI next month. She denies any balance changes or headaches.  She denies any rashes or skin changes.  She is here today for evaluation and repeat blood work.  MEDICAL HISTORY: Past Medical History:  Diagnosis Date   Arthritis    Brain tumor ([facility])    Depression    Dyspnea    WITH EXERTION-SEEING DR [name]   HTN (hypertension)    Hyperlipemia    Seizures (HCC)     ALLERGIES:  has No Known Allergies.  MEDICATIONS:  Current Outpatient Medications  Medication Sig Dispense Refill   acetaminophen (TYLENOL) 500 MG tablet Take 1,000 mg by mouth every 6 (six) hours as needed  for mild pain (or arthritis). (Patient not taking: Reported on 04/22/2021)     atorvastatin (LIPITOR) 40 MG tablet Take 1 tablet (40 mg total) by mouth at bedtime. 90 tablet 3   cetirizine (ZYRTEC) 10 MG tablet Take 10 mg by mouth daily as needed for allergies or rhinitis. (Patient not taking: Reported on 04/22/2021)     citalopram (CELEXA) 20 MG tablet Take 1 tablet (20 mg total) by mouth at bedtime. 90 tablet 1    dexamethasone (DECADRON) 4 MG tablet Take 0.5 tablets (2 mg total) by mouth daily. 30 tablet 0   fluticasone (FLONASE) 50 MCG/ACT nasal spray Place 1 spray into both nostrils daily as needed for allergies. (Patient not taking: Reported on 04/22/2021)     HYDROcodone-acetaminophen (NORCO/VICODIN) 5-325 MG tablet Take 1 tablet by mouth every 4 (four) hours as needed for moderate pain. (Patient not taking: Reported on 04/22/2021) 30 tablet 0   levETIRAcetam (KEPPRA) 500 MG tablet Take 2 tablets (1,000 mg total) by mouth 2 (two) times daily. 120 tablet 3   lisinopril-hydrochlorothiazide (ZESTORETIC) 10-12.5 MG tablet Take 1 tablet by mouth daily. (Patient not taking: Reported on 04/22/2021) 90 tablet 3   loperamide (IMODIUM A-D) 2 MG tablet Take 2 mg by mouth 2 (two) times daily as needed for diarrhea or loose stools. (Patient not taking: Reported on 04/22/2021)     osimertinib mesylate (TAGRISSO) 80 MG tablet Take 1 tablet (80 mg total) by mouth daily. 30 tablet 3   prochlorperazine (COMPAZINE) 10 MG tablet Take 1 tablet (10 mg total) by mouth every 6 (six) hours as needed for nausea or vomiting. 30 tablet 0   propranolol ER (INDERAL LA) 60 MG 24 hr capsule Take 1 capsule (60 mg total) by mouth at bedtime. (Patient not taking: Reported on 04/22/2021) 90 capsule 3   No current facility-administered medications for this visit.    SURGICAL HISTORY:  Past Surgical History:  Procedure Laterality Date   APPENDECTOMY  7989   APPLICATION OF CRANIAL NAVIGATION N/A 04/08/2021   Procedure: APPLICATION OF CRANIAL NAVIGATION;  Surgeon: Judith Part, MD;  Location: [facility];  Service: Neurosurgery;  Laterality: N/A;  RM 21   CRANIOTOMY Left 04/08/2021   Procedure: Left Craniotomy for tumor resection with brainlab;  Surgeon: Judith Part, MD;  Location: [facility];  Service: Neurosurgery;  Laterality: Left;   EYE SURGERY Bilateral    CATARACTS   OOPHORECTOMY Right    VIDEO BRONCHOSCOPY WITH ENDOBRONCHIAL  NAVIGATION Left 12/23/2020   Procedure: VIDEO BRONCHOSCOPY WITH ENDOBRONCHIAL NAVIGATION;  Surgeon: Tyler Pita, MD;  Location: [facility];  Service: Pulmonary;  Laterality: Left;   VIDEO BRONCHOSCOPY WITH ENDOBRONCHIAL NAVIGATION Left 02/01/2021   Procedure: ROBOTIC ASSISTED VIDEO BRONCHOSCOPY WITH ENDOBRONCHIAL NAVIGATION;  Surgeon: Tyler Pita, MD;  Location: [facility];  Service: Pulmonary;  Laterality: Left;   VIDEO BRONCHOSCOPY WITH ENDOBRONCHIAL ULTRASOUND Left 12/23/2020   Procedure: VIDEO BRONCHOSCOPY WITH ENDOBRONCHIAL ULTRASOUND;  Surgeon: Tyler Pita, MD;  Location: [facility];  Service: Pulmonary;  Laterality: Left;   VIDEO BRONCHOSCOPY WITH ENDOBRONCHIAL ULTRASOUND Left 02/01/2021   Procedure: ROBOTIC ASSITSTED VIDEO BRONCHOSCOPY WITH ENDOBRONCHIAL ULTRASOUND;  Surgeon: Tyler Pita, MD;  Location: [facility];  Service: Pulmonary;  Laterality: Left;    REVIEW OF SYSTEMS:   Review of Systems  Constitutional: Negative for appetite change, chills, fatigue, fever and unexpected weight change.  HENT:   Negative for mouth sores, nosebleeds, sore throat and trouble swallowing.   Eyes: Negative for eye problems and  icterus.  Respiratory: Positive for dyspnea on exertion. Negative for cough, hemoptysis, and wheezing.   Cardiovascular: Negative for chest pain and leg swelling.  Gastrointestinal: Positive for intermittent diarrhea. Negative for abdominal pain, constipation, nausea and vomiting.  Genitourinary: Negative for bladder incontinence, difficulty urinating, dysuria, frequency and hematuria.   Musculoskeletal: Negative for back pain, gait problem, neck pain and neck stiffness.  Skin: Negative for itching and rash.  Neurological: Positive for trouble with word finding. Negative for dizziness, extremity weakness, gait problem, headaches, light-headedness and seizures.  Hematological: Negative for adenopathy. Does not bruise/bleed easily.  Psychiatric/Behavioral:  Negative for confusion, depression and sleep disturbance. The patient is not nervous/anxious.     PHYSICAL EXAMINATION:  Blood pressure 101/67, pulse 64, temperature 98 F (36.7 C), temperature source Oral, resp. rate 19, height 5' 4" (1.626 m), weight 159 lb (72.1 kg), SpO2 96 %.  ECOG PERFORMANCE STATUS: 1  Physical Exam  Constitutional: Oriented to person, place, and time and well-developed, well-nourished, and in no distress.  HENT:  Head: Normocephalic and atraumatic.  Mouth/Throat: Oropharynx is clear and moist. No oropharyngeal exudate.  Eyes: Conjunctivae are normal. Right eye exhibits no discharge. Left eye exhibits no discharge. No scleral icterus.  Neck: Normal range of motion. Neck supple.  Cardiovascular: Normal rate, regular rhythm, normal heart sounds and intact distal pulses.   Pulmonary/Chest: Effort normal and breath sounds normal. No respiratory distress. No wheezes. No rales.  Abdominal: Soft. Bowel sounds are normal. Exhibits no distension and no mass. There is no tenderness.  Musculoskeletal: Normal range of motion. Exhibits no edema.  Lymphadenopathy:    No cervical adenopathy.  Neurological: Alert and oriented to person, place, and time. Exhibits normal muscle tone. Gait normal. Coordination normal.  Skin: Skin is warm and dry. No rash noted. Not diaphoretic. No erythema. No pallor.  Psychiatric: Mood, memory and judgment normal.  Vitals reviewed.  LABORATORY DATA: Lab Results  Component Value Date   WBC 8.2 06/08/2021   HGB 10.5 (L) 06/08/2021   HCT 32.3 (L) 06/08/2021   MCV 98.2 06/08/2021   PLT 163 06/08/2021      Chemistry      Component Value Date/Time   NA 138 06/08/2021 1414   K 3.7 06/08/2021 1414   CL 108 06/08/2021 1414   CO2 20 (L) 06/08/2021 1414   BUN 32 (H) 06/08/2021 1414   CREATININE 1.65 (H) 06/08/2021 1414      Component Value Date/Time   CALCIUM 9.3 06/08/2021 1414   ALKPHOS 41 06/08/2021 1414   AST 28 06/08/2021 1414   ALT  66 (H) 06/08/2021 1414   BILITOT 0.6 06/08/2021 1414       RADIOGRAPHIC STUDIES:  No results found.   ASSESSMENT/PLAN:  This is a very pleasant 75 years old white female recently diagnosed with stage IV (T1c, N1, M1c) non-small cell lung cancer, adenocarcinoma presented with locally advanced disease in the left lung with a left upper lobe lung nodule in addition to left hilar adenopathy and solitary left frontal brain metastasis s/p resection of the brain tumor with SRS in July 2022.  This was diagnosed in July 2022. Her molecular studies show she is positive for EGFR mutation L861Q.   The patient is currently undergoing targeted treatment with Tagrisso 80 mg p.o. daily.  She started this on 05/27/21  Labs are reviewed.  Recommend that she continue on the same treatment at the same dose.   Her creatinine is slightly elevated today compared to last time, although  she does have fluctuating kidney function and diagnosed CKD. Encouraged her to stay hydrated, especially since she has intermittent diarrhea from the [place]. She is diligent about staying hydrated. Advised to stay on top of the diarrhea and take imodium if needed. Her BP is a little low today for her. She is asymptomatic. She did take her BP meds today and did recently resume propanolol for her essential tremor. Advised her to purchase a BP cuff and monitor her BP at home daily and to keep a log of her readings. If it continues to be low, advised to reach out to prescribing provider for dose adjustment of her BP meds.   I reviewed Dr. Renda Rolls last note with the patient. She had some questions about the keppra and decadron dose. Per Dr. Renda Rolls note on 05/18/21, "We recommended decreasing decadron to 43m daily x7-10 days, then decreasing to 161mfor 7-10 days, then stopping if tolerated." She is presently taking 1 mg of decadron and has been doing so for >7-10 days. Discussed she may stop if tolerated. However, if she has recurrent  neurological symptoms and is not able to tolerate, advised she needs to call [facility] sooner for further recommendations. I also reviewed that Dr. VaMickeal Skinnerecommends continuing on the 1000 mg BID dose of Keppra. She is scheduled for repeat brain MRI next month. She will continue with speech therapy.   We will see her back for follow-up visit in 2 weeks for evaluation and repeat blood work.  She got charged for an office visit that was cancelled at our clinic. She had a follow up scheduled with me on 05/25/21 for a medication follow up visit; however, I cancelled the appointment since she had not started her medication yet. I will reach out to billing department to see if she can be reimbursed.   She is inquiring about who to contact for her routine vaccine's. Discussed that at the next office visit, we may have the high dose flu vaccine at that time.   The patient was advised to call immediately if she has any concerning symptoms in the interval. The patient voices understanding of current disease status and treatment options and is in agreement with the current care plan. All questions were answered. The patient knows to call the clinic with any problems, questions or concerns. We can certainly see the patient much sooner if necessary   Orders Placed This Encounter  Procedures   CBC with Differential ([facility])    Standing Status:   Future    Standing Expiration Date:   06/08/2022   CMP ([facility])    Standing Status:   Future    Standing Expiration Date:   06/08/2022      The total time spent in the appointment was 20-29 minutes  Saki Legore L Johnothan Bascomb, PA-C 06/08/21

## 2021-06-04 ENCOUNTER — Ambulatory Visit: Payer: Medicare HMO

## 2021-06-07 ENCOUNTER — Other Ambulatory Visit: Payer: Self-pay

## 2021-06-07 ENCOUNTER — Encounter: Payer: Self-pay | Admitting: Internal Medicine

## 2021-06-07 ENCOUNTER — Ambulatory Visit: Payer: Medicare HMO

## 2021-06-07 DIAGNOSIS — C3412 Malignant neoplasm of upper lobe, left bronchus or lung: Secondary | ICD-10-CM

## 2021-06-07 NOTE — Progress Notes (Signed)
Patient called to get further details regarding [employer] grant. Advised what is needed to apply and she will bring at next appointment 9/27.  She has my card for any additional financial questions or concerns.

## 2021-06-08 ENCOUNTER — Inpatient Hospital Stay (HOSPITAL_BASED_OUTPATIENT_CLINIC_OR_DEPARTMENT_OTHER): Payer: Medicare HMO | Admitting: Physician Assistant

## 2021-06-08 ENCOUNTER — Encounter: Payer: Self-pay | Admitting: Physician Assistant

## 2021-06-08 ENCOUNTER — Ambulatory Visit: Payer: Medicare HMO

## 2021-06-08 ENCOUNTER — Other Ambulatory Visit: Payer: Self-pay

## 2021-06-08 ENCOUNTER — Inpatient Hospital Stay: Payer: Medicare HMO

## 2021-06-08 VITALS — BP 101/67 | HR 64 | Temp 98.0°F | Resp 19 | Ht 64.0 in | Wt 159.0 lb

## 2021-06-08 DIAGNOSIS — Z87891 Personal history of nicotine dependence: Secondary | ICD-10-CM | POA: Diagnosis not present

## 2021-06-08 DIAGNOSIS — I959 Hypotension, unspecified: Secondary | ICD-10-CM | POA: Diagnosis not present

## 2021-06-08 DIAGNOSIS — R197 Diarrhea, unspecified: Secondary | ICD-10-CM | POA: Diagnosis not present

## 2021-06-08 DIAGNOSIS — C7931 Secondary malignant neoplasm of brain: Secondary | ICD-10-CM | POA: Diagnosis not present

## 2021-06-08 DIAGNOSIS — C3412 Malignant neoplasm of upper lobe, left bronchus or lung: Secondary | ICD-10-CM

## 2021-06-08 DIAGNOSIS — N189 Chronic kidney disease, unspecified: Secondary | ICD-10-CM | POA: Diagnosis not present

## 2021-06-08 DIAGNOSIS — I129 Hypertensive chronic kidney disease with stage 1 through stage 4 chronic kidney disease, or unspecified chronic kidney disease: Secondary | ICD-10-CM | POA: Diagnosis not present

## 2021-06-08 DIAGNOSIS — Z79899 Other long term (current) drug therapy: Secondary | ICD-10-CM | POA: Diagnosis not present

## 2021-06-08 LAB — CBC WITH DIFFERENTIAL (CANCER CENTER ONLY)
Abs Immature Granulocytes: 0.1 10*3/uL — ABNORMAL HIGH (ref 0.00–0.07)
Basophils Absolute: 0 10*3/uL (ref 0.0–0.1)
Basophils Relative: 0 %
Eosinophils Absolute: 0.4 10*3/uL (ref 0.0–0.5)
Eosinophils Relative: 5 %
HCT: 32.3 % — ABNORMAL LOW (ref 36.0–46.0)
Hemoglobin: 10.5 g/dL — ABNORMAL LOW (ref 12.0–15.0)
Immature Granulocytes: 1 %
Lymphocytes Relative: 23 %
Lymphs Abs: 1.9 10*3/uL (ref 0.7–4.0)
MCH: 31.9 pg (ref 26.0–34.0)
MCHC: 32.5 g/dL (ref 30.0–36.0)
MCV: 98.2 fL (ref 80.0–100.0)
Monocytes Absolute: 0.7 10*3/uL (ref 0.1–1.0)
Monocytes Relative: 8 %
Neutro Abs: 5.1 10*3/uL (ref 1.7–7.7)
Neutrophils Relative %: 63 %
Platelet Count: 163 10*3/uL (ref 150–400)
RBC: 3.29 MIL/uL — ABNORMAL LOW (ref 3.87–5.11)
RDW: 13.5 % (ref 11.5–15.5)
WBC Count: 8.2 10*3/uL (ref 4.0–10.5)
nRBC: 0 % (ref 0.0–0.2)

## 2021-06-08 LAB — CMP (CANCER CENTER ONLY)
ALT: 66 U/L — ABNORMAL HIGH (ref 0–44)
AST: 28 U/L (ref 15–41)
Albumin: 3.5 g/dL (ref 3.5–5.0)
Alkaline Phosphatase: 41 U/L (ref 38–126)
Anion gap: 10 (ref 5–15)
BUN: 32 mg/dL — ABNORMAL HIGH (ref 8–23)
CO2: 20 mmol/L — ABNORMAL LOW (ref 22–32)
Calcium: 9.3 mg/dL (ref 8.9–10.3)
Chloride: 108 mmol/L (ref 98–111)
Creatinine: 1.65 mg/dL — ABNORMAL HIGH (ref 0.44–1.00)
GFR, Estimated: 32 mL/min — ABNORMAL LOW (ref >60)
Glucose, Bld: 102 mg/dL — ABNORMAL HIGH (ref 70–99)
Potassium: 3.7 mmol/L (ref 3.5–5.1)
Sodium: 138 mmol/L (ref 135–145)
Total Bilirubin: 0.6 mg/dL (ref 0.3–1.2)
Total Protein: 6.3 g/dL — ABNORMAL LOW (ref 6.5–8.1)

## 2021-06-08 NOTE — Progress Notes (Signed)
Met with patient at registration to obtain income for [employer] grant.  Patient approved for one-time $1000 [employer] grant to assist with personal expenses while going through treatment. Discussed in detail expenses and how they are covered. She has a copy of the approval letter and expense sheet along with the Outpatient pharmacy information. She received a gift card today from her grant.  She has my card for any additional financial questions or concerns as well as grant paperwork in a folder.  

## 2021-06-09 ENCOUNTER — Ambulatory Visit: Payer: Medicare HMO

## 2021-06-09 NOTE — Telephone Encounter (Signed)
Patient is approved for Tagrisso at no cost from [place] 05/13/21-09/11/21  [facility] Patient Advance Phone [phone] Fax [phone] 06/09/2021 10:26 AM

## 2021-06-10 ENCOUNTER — Ambulatory Visit: Payer: Medicare HMO

## 2021-06-11 ENCOUNTER — Ambulatory Visit: Payer: Medicare HMO

## 2021-06-14 ENCOUNTER — Ambulatory Visit: Payer: Medicare HMO

## 2021-06-15 ENCOUNTER — Ambulatory Visit: Payer: Medicare HMO

## 2021-06-15 NOTE — Progress Notes (Signed)
[name] OFFICE PROGRESS NOTE  [name], MD [place] [address]  DIAGNOSIS: Stage IV (T1c, N1, M1c) non-small cell lung cancer, adenocarcinoma presented with locally advanced disease in the left lung  with a left upper lobe lung nodule in addition to left hilar adenopathy and a solitary left frontal brain metastasis s/p resection of the brain tumor with SRS.  This was diagnosed in July 2022.   Biomarker Findings Tumor Mutational Burden - 43 Muts/Mb Microsatellite status - MS-Stable Genomic Findings For a complete list of the genes assayed, please refer to the Appendix. EGFR L861Q MTAP loss CDKN2A/B CDKN2A loss, CDKN2B loss CUL4A amplification - equivocal? IRS2 amplification SF3B1 K666N TERT promoter -124C>T TP53 N247I 7 Disease relevant genes with no reportable alterations: ALK, BRAF, ERBB2, KRAS, MET, RET, ROS1   PDL1 Expression 95%  PRIOR THERAPY: Resection followed by [facility] of the solitary frontal lobe brain metastasis on July 28th, 2022   CURRENT THERAPY: Targeted treatment with Tagrisso 80 mg p.o. daily. First dose 05/27/21.    INTERVAL HISTORY: Marie Hensley 75 y.o. female returns to the clinic today for a follow a up visit  accompanied by her daughter.  The patient is currently undergoing targeted treatment with Tagrisso 80 mg p.o. daily.  She has been on this for approximately 1 month.  She has been tolerating this fairly well except for some mild intermittent manageable diarrhea.  She will take Imodium if needed for diarrhea.  She states that some days she does not have any diarrhea while other days she might have 2 bouts of diarrhea.  She denies any associated fevers, abdominal pain, or blood in the stool.  She has some baseline kidney dysfunction and tries to stay hydrated with the diarrhea but thinks she could have been doing better with staying hydrated recently.  Otherwise she denies any recent chills, night sweats, or  significant weight loss.  Her appetite is "up and down". She tries to drink 1-2 protein drinks daily. She denies any chest pain, cough, or hemoptysis.  She reports her mild baseline dyspnea with certain strenuous activities but states this is stable. She denies any nausea, vomiting, or constipation.  She follows with radiation oncology and neuro-oncology for her history of metastatic disease to the brain for which she underwent SRS and resection to the solitary brain metastasis.  She sees speech therapy twice a week for her neurological concerns related to slurred speech and trouble with word finding.  She states they told her they were please with the progress she is making. She has a follow up brain MRI on 07/08/21. She has a follow up with Ashlynn in radiation oncology on 07/14/21 and follow up with Dr. Mickeal Skinner on 07/13/21. She is wondering if these can be reschedule to the same day to limit the amount of days having to come to the clinic. She has come off her Decadron without any headaches or visual changes, or balance changes.  She continues to take Keppra 100 mg twice daily.  She denies any rashes or skin changes.  She is here today for evaluation and repeat blood work.     MEDICAL HISTORY: Past Medical History:  Diagnosis Date   Arthritis    Brain tumor ([place])    Depression    Dyspnea    WITH EXERTION-SEEING DR [name]   HTN (hypertension)    Hyperlipemia    Seizures ([facility])     ALLERGIES:  has No Known Allergies.  MEDICATIONS:  Current Outpatient Medications  Medication Sig Dispense Refill   acetaminophen (TYLENOL) 500 MG tablet Take 1,000 mg by mouth every 6 (six) hours as needed for mild pain (or arthritis).     atorvastatin (LIPITOR) 40 MG tablet Take 1 tablet (40 mg total) by mouth at bedtime. 90 tablet 3   cetirizine (ZYRTEC) 10 MG tablet Take 10 mg by mouth daily as needed for allergies or rhinitis.     citalopram (CELEXA) 20 MG tablet Take 1 tablet (20 mg total) by mouth at  bedtime. 90 tablet 1   dexamethasone (DECADRON) 4 MG tablet Take 0.5 tablets (2 mg total) by mouth daily. 30 tablet 0   fluticasone (FLONASE) 50 MCG/ACT nasal spray Place 1 spray into both nostrils daily as needed for allergies.     HYDROcodone-acetaminophen (NORCO/VICODIN) 5-325 MG tablet Take 1 tablet by mouth every 4 (four) hours as needed for moderate pain. 30 tablet 0   levETIRAcetam (KEPPRA) 500 MG tablet Take 2 tablets (1,000 mg total) by mouth 2 (two) times daily. 120 tablet 3   lisinopril-hydrochlorothiazide (ZESTORETIC) 10-12.5 MG tablet Take 1 tablet by mouth daily. 90 tablet 3   loperamide (IMODIUM A-D) 2 MG tablet Take 2 mg by mouth 2 (two) times daily as needed for diarrhea or loose stools.     osimertinib mesylate (TAGRISSO) 80 MG tablet Take 1 tablet (80 mg total) by mouth daily. 30 tablet 3   prochlorperazine (COMPAZINE) 10 MG tablet Take 1 tablet (10 mg total) by mouth every 6 (six) hours as needed for nausea or vomiting. 30 tablet 0   propranolol ER (INDERAL LA) 60 MG 24 hr capsule Take 1 capsule (60 mg total) by mouth at bedtime. 90 capsule 3   No current facility-administered medications for this visit.    SURGICAL HISTORY:  Past Surgical History:  Procedure Laterality Date   APPENDECTOMY  7253   APPLICATION OF CRANIAL NAVIGATION N/A 04/08/2021   Procedure: APPLICATION OF CRANIAL NAVIGATION;  Surgeon: Judith Part, MD;  Location: [facility];  Service: Neurosurgery;  Laterality: N/A;  RM 21   CRANIOTOMY Left 04/08/2021   Procedure: Left Craniotomy for tumor resection with brainlab;  Surgeon: Judith Part, MD;  Location: [facility];  Service: Neurosurgery;  Laterality: Left;   EYE SURGERY Bilateral    CATARACTS   OOPHORECTOMY Right    VIDEO BRONCHOSCOPY WITH ENDOBRONCHIAL NAVIGATION Left 12/23/2020   Procedure: VIDEO BRONCHOSCOPY WITH ENDOBRONCHIAL NAVIGATION;  Surgeon: Tyler Pita, MD;  Location: [facility];  Service: Pulmonary;  Laterality: Left;   VIDEO  BRONCHOSCOPY WITH ENDOBRONCHIAL NAVIGATION Left 02/01/2021   Procedure: ROBOTIC ASSISTED VIDEO BRONCHOSCOPY WITH ENDOBRONCHIAL NAVIGATION;  Surgeon: Tyler Pita, MD;  Location: [facility];  Service: Pulmonary;  Laterality: Left;   VIDEO BRONCHOSCOPY WITH ENDOBRONCHIAL ULTRASOUND Left 12/23/2020   Procedure: VIDEO BRONCHOSCOPY WITH ENDOBRONCHIAL ULTRASOUND;  Surgeon: Tyler Pita, MD;  Location: [facility];  Service: Pulmonary;  Laterality: Left;   VIDEO BRONCHOSCOPY WITH ENDOBRONCHIAL ULTRASOUND Left 02/01/2021   Procedure: ROBOTIC ASSITSTED VIDEO BRONCHOSCOPY WITH ENDOBRONCHIAL ULTRASOUND;  Surgeon: Tyler Pita, MD;  Location: [facility];  Service: Pulmonary;  Laterality: Left;    REVIEW OF SYSTEMS:   Review of Systems  Constitutional: Negative for appetite change, chills, fatigue, fever and unexpected weight change.  HENT: Negative for mouth sores, nosebleeds, sore throat and trouble swallowing.   Eyes: Negative for eye problems and icterus.  Respiratory: Positive for mild dyspnea with certain activities. Negative for cough, hemoptysis, and wheezing.  Cardiovascular: Negative for chest pain and leg swelling.  Gastrointestinal: Positive for intermittent diarrhea. Negative for abdominal pain, constipation, nausea and vomiting.  Genitourinary: Negative for bladder incontinence, difficulty urinating, dysuria, frequency and hematuria.   Musculoskeletal: Negative for back pain, gait problem, neck pain and neck stiffness.  Skin: Negative for itching and rash.  Neurological: Positive for trouble with word finding. Negative for dizziness, extremity weakness, gait problem, headaches, light-headedness and seizures.  Hematological: Negative for adenopathy. Does not bruise/bleed easily.  Psychiatric/Behavioral: Negative for confusion, depression and sleep disturbance. The patient is not nervous/anxious.     PHYSICAL EXAMINATION:  Blood pressure 105/67, pulse 61, temperature 97.6 F (36.4  C), temperature source Oral, resp. rate 17, weight 158 lb 14.4 oz (72.1 kg), SpO2 96 %.  ECOG PERFORMANCE STATUS: 1  Physical Exam  Constitutional: Oriented to person, place, and time and well-developed, well-nourished, and in no distress.  HENT:  Head: Normocephalic and atraumatic.  Mouth/Throat: Oropharynx is clear and moist. No oropharyngeal exudate.  Eyes: Conjunctivae are normal. Right eye exhibits no discharge. Left eye exhibits no discharge. No scleral icterus.  Neck: Normal range of motion. Neck supple.  Cardiovascular: Normal rate, regular rhythm, normal heart sounds and intact distal pulses.   Pulmonary/Chest: Effort normal and breath sounds normal. No respiratory distress. No wheezes. No rales.  Abdominal: Soft. Bowel sounds are normal. Exhibits no distension and no mass. There is no tenderness.  Musculoskeletal: Normal range of motion. Exhibits no edema.  Lymphadenopathy:    No cervical adenopathy.  Neurological: Alert and oriented to person, place, and time. Exhibits normal muscle tone. Gait normal. Coordination normal. Has some trouble with word finding.  Skin: Skin is warm and dry. No rash noted. Not diaphoretic. No erythema. No pallor.  Psychiatric: Mood, memory and judgment normal.  Vitals reviewed.  LABORATORY DATA: Lab Results  Component Value Date   WBC 9.7 06/22/2021   HGB 10.7 (L) 06/22/2021   HCT 32.5 (L) 06/22/2021   MCV 95.9 06/22/2021   PLT 187 06/22/2021      Chemistry      Component Value Date/Time   NA 138 06/22/2021 1444   K 3.6 06/22/2021 1444   CL 108 06/22/2021 1444   CO2 22 06/22/2021 1444   BUN 41 (H) 06/22/2021 1444   CREATININE 1.92 (H) 06/22/2021 1444      Component Value Date/Time   CALCIUM 9.3 06/22/2021 1444   ALKPHOS 38 06/22/2021 1444   AST 17 06/22/2021 1444   ALT 30 06/22/2021 1444   BILITOT 0.4 06/22/2021 1444       RADIOGRAPHIC STUDIES:  No results found.   ASSESSMENT/PLAN:  This is a very pleasant 75 years old  white female diagnosed with stage IV (T1c, N1, M1c) non-small cell lung cancer, adenocarcinoma presented with locally advanced disease in the left lung with a left upper lobe lung nodule in addition to left hilar adenopathy and solitary left frontal brain metastasis s/p resection of the brain tumor with SRS in July 2022.  This was diagnosed in July 2022. Her molecular studies show she is positive for EGFR mutation L861Q.   The patient is currently undergoing targeted treatment with Tagrisso 80 mg p.o. daily.  She started this on 05/27/21.   The patient was seen with Dr. Julien Nordmann. Labs are reviewed. She carries the diagnosis of CKD. Her creatinine is a little more elevated today at 1.92 likely due to dehydration from the intermittent diarrhea. Dr. Julien Nordmann recommends for her to ensure to stay hydrated. Recommend  that she continue on the same treatment at the same dose.   We will see her back for follow-up visit in 4 weeks for evaluation.  I will arrange for restaging CT scan and a few days prior to her next visit.   She has repeat brain MRI later this month on 07/08/2021. She is scheduled to see neuro-oncology on 11/1 and radiation oncology on 11/2. I will reach out to see if these can be rescheduled to the same day. If not, the patient would be open to making one of the appointments a telephone visit.   She will continue with speech therapy.   She will continue to use imodium if needed for diarrhea.   The patient was advised to call immediately if she has any concerning symptoms in the interval. The patient voices understanding of current disease status and treatment options and is in agreement with the current care plan. All questions were answered. The patient knows to call the clinic with any problems, questions or concerns. We can certainly see the patient much sooner if necessary      Orders Placed This Encounter  Procedures   CT Chest Wo Contrast    Standing Status:   Future    Standing  Expiration Date:   06/22/2022    Order Specific Question:   Preferred imaging location?    Answer:   [facility]   CT Abdomen Pelvis Wo Contrast    Standing Status:   Future    Standing Expiration Date:   06/22/2022    Order Specific Question:   Preferred imaging location?    Answer:   [facility]    Order Specific Question:   Is Oral Contrast requested for this exam?    Answer:   Yes, Per Radiology protocol   CBC with Differential ([facility] Only)    Standing Status:   Future    Standing Expiration Date:   06/22/2022   CMP ([facility] only)    Standing Status:   Future    Standing Expiration Date:   06/22/2022      Tobe Sos Kingdom Vanzanten, PA-C 06/22/21  ADDENDUM: Hematology/Oncology Attending: I had a face-to-face encounter with the patient today.  I reviewed her records, lab and recommended her care plan.  This is a very pleasant 75 years old white female with a stage IV (T1c, N1, M1 C) non-small cell lung cancer, adenocarcinoma with positive EGFR mutation ([id number]) presented with locally advanced disease in the left lung with left upper lobe lung nodule in addition to left hilar adenopathy and solitary left frontal brain metastasis s/p resection and SRS in July 2022. The patient is currently undergoing treatment with targeted therapy with Tagrisso 80 mg p.o. daily and has been tolerating her treatment well except for few episodes of intermittent diarrhea.  She use Imodium on as-needed basis. She denied having any significant skin rash. I recommended for the patient to continue her current treatment with Tagrisso with the same dose. We will see her back for follow-up visit in 1 months for evaluation with repeat CT scan of the chest, abdomen pelvis for restaging of her disease.  She is also scheduled to have repeat MRI of the brain later this month. The patient was advised to call immediately if she has any other concerning symptoms in the  interval. Disclaimer: This note was dictated with voice recognition software. Similar sounding words can inadvertently be transcribed and may be missed upon review. Eilleen Kempf, MD 06/22/21

## 2021-06-16 ENCOUNTER — Telehealth: Payer: Self-pay | Admitting: Radiation Therapy

## 2021-06-16 ENCOUNTER — Ambulatory Visit: Payer: Medicare HMO

## 2021-06-16 NOTE — Telephone Encounter (Signed)
Left a detailed message informing the patient of her brain MRI location and time change. Her insurance required we switch facilities from [facility] to [facility], the scan will still be done on 10/27, but now at [facility] and the check in time is 2:30.   Mont Dutton R.T.(R)(T) Radiation Special Procedures Navigator

## 2021-06-17 ENCOUNTER — Ambulatory Visit: Payer: Medicare HMO

## 2021-06-18 ENCOUNTER — Ambulatory Visit: Payer: Medicare HMO

## 2021-06-20 ENCOUNTER — Encounter: Payer: Self-pay | Admitting: Internal Medicine

## 2021-06-20 NOTE — Progress Notes (Signed)
  Radiation Oncology         [phone] ________________________________  Name: Marie Hensley MRN: [id number]  Date: 04/06/2021  DOB: 06-05-1946  End of Treatment Note  Diagnosis:   75 y/o female with a 1.5 cm mass in the left frontal lobe, likely a metastasis from primary lung cancer     Indication for treatment:  Curative       Radiation treatment dates:   04/06/21  Site/dose/beams/energy:   Left frontal 20 mm target was treated using 4 Rapid Arc VMAT Beams to a prescription dose of 20 Gy.  ExacTrac registration was performed for each couch angle.  The 100% isodose line was prescribed.  6 MV X-rays were delivered in the flattening filter free beam mode.  Narrative: The patient tolerated radiation treatment relatively well.     Plan: The patient has completed radiation treatment. The patient will return to radiation oncology clinic for routine followup in one month. I advised her to call or return sooner if she has any questions or concerns related to her recovery or treatment. ________________________________  Sheral Apley. Tammi Klippel, M.D.

## 2021-06-21 ENCOUNTER — Ambulatory Visit: Payer: Medicare HMO

## 2021-06-22 ENCOUNTER — Inpatient Hospital Stay: Payer: Medicare HMO | Attending: Internal Medicine

## 2021-06-22 ENCOUNTER — Inpatient Hospital Stay (HOSPITAL_BASED_OUTPATIENT_CLINIC_OR_DEPARTMENT_OTHER): Payer: Medicare HMO | Admitting: Physician Assistant

## 2021-06-22 ENCOUNTER — Other Ambulatory Visit: Payer: Self-pay

## 2021-06-22 ENCOUNTER — Ambulatory Visit: Payer: Medicare HMO

## 2021-06-22 VITALS — BP 105/67 | HR 61 | Temp 97.6°F | Resp 17 | Wt 158.9 lb

## 2021-06-22 DIAGNOSIS — C771 Secondary and unspecified malignant neoplasm of intrathoracic lymph nodes: Secondary | ICD-10-CM | POA: Insufficient documentation

## 2021-06-22 DIAGNOSIS — Z79899 Other long term (current) drug therapy: Secondary | ICD-10-CM | POA: Insufficient documentation

## 2021-06-22 DIAGNOSIS — C3412 Malignant neoplasm of upper lobe, left bronchus or lung: Secondary | ICD-10-CM | POA: Insufficient documentation

## 2021-06-22 DIAGNOSIS — C7931 Secondary malignant neoplasm of brain: Secondary | ICD-10-CM | POA: Insufficient documentation

## 2021-06-22 DIAGNOSIS — Z923 Personal history of irradiation: Secondary | ICD-10-CM | POA: Diagnosis not present

## 2021-06-22 LAB — CBC WITH DIFFERENTIAL (CANCER CENTER ONLY)
Abs Immature Granulocytes: 0.17 10*3/uL — ABNORMAL HIGH (ref 0.00–0.07)
Basophils Absolute: 0.1 10*3/uL (ref 0.0–0.1)
Basophils Relative: 1 %
Eosinophils Absolute: 0.2 10*3/uL (ref 0.0–0.5)
Eosinophils Relative: 2 %
HCT: 32.5 % — ABNORMAL LOW (ref 36.0–46.0)
Hemoglobin: 10.7 g/dL — ABNORMAL LOW (ref 12.0–15.0)
Immature Granulocytes: 2 %
Lymphocytes Relative: 19 %
Lymphs Abs: 1.9 10*3/uL (ref 0.7–4.0)
MCH: 31.6 pg (ref 26.0–34.0)
MCHC: 32.9 g/dL (ref 30.0–36.0)
MCV: 95.9 fL (ref 80.0–100.0)
Monocytes Absolute: 0.8 10*3/uL (ref 0.1–1.0)
Monocytes Relative: 8 %
Neutro Abs: 6.7 10*3/uL (ref 1.7–7.7)
Neutrophils Relative %: 68 %
Platelet Count: 187 10*3/uL (ref 150–400)
RBC: 3.39 MIL/uL — ABNORMAL LOW (ref 3.87–5.11)
RDW: 13.6 % (ref 11.5–15.5)
WBC Count: 9.7 10*3/uL (ref 4.0–10.5)
nRBC: 0 % (ref 0.0–0.2)

## 2021-06-22 LAB — CMP (CANCER CENTER ONLY)
ALT: 30 U/L (ref 0–44)
AST: 17 U/L (ref 15–41)
Albumin: 3.4 g/dL — ABNORMAL LOW (ref 3.5–5.0)
Alkaline Phosphatase: 38 U/L (ref 38–126)
Anion gap: 8 (ref 5–15)
BUN: 41 mg/dL — ABNORMAL HIGH (ref 8–23)
CO2: 22 mmol/L (ref 22–32)
Calcium: 9.3 mg/dL (ref 8.9–10.3)
Chloride: 108 mmol/L (ref 98–111)
Creatinine: 1.92 mg/dL — ABNORMAL HIGH (ref 0.44–1.00)
GFR, Estimated: 27 mL/min — ABNORMAL LOW (ref >60)
Glucose, Bld: 87 mg/dL (ref 70–99)
Potassium: 3.6 mmol/L (ref 3.5–5.1)
Sodium: 138 mmol/L (ref 135–145)
Total Bilirubin: 0.4 mg/dL (ref 0.3–1.2)
Total Protein: 6.4 g/dL — ABNORMAL LOW (ref 6.5–8.1)

## 2021-06-24 ENCOUNTER — Telehealth: Payer: Self-pay | Admitting: *Deleted

## 2021-06-24 NOTE — Telephone Encounter (Signed)
Facsimile report [name] on 06/23/2021 received Daine Gravel 2022-2023 [facility] at [id number].  See current EMR Immunization record.

## 2021-06-25 ENCOUNTER — Encounter: Payer: Self-pay | Admitting: Medical Oncology

## 2021-07-01 ENCOUNTER — Other Ambulatory Visit: Payer: Self-pay | Admitting: Physician Assistant

## 2021-07-01 ENCOUNTER — Telehealth: Payer: Self-pay | Admitting: *Deleted

## 2021-07-01 DIAGNOSIS — R531 Weakness: Secondary | ICD-10-CM

## 2021-07-01 DIAGNOSIS — R112 Nausea with vomiting, unspecified: Secondary | ICD-10-CM

## 2021-07-01 DIAGNOSIS — R197 Diarrhea, unspecified: Secondary | ICD-10-CM

## 2021-07-01 NOTE — Telephone Encounter (Signed)
Patients daughter called to see if patient should continue to stay on the Decadron 1mg  as she completed her prescription.  She has been off of the medication for 3 days.  She reports dizziness, nausea, vomiting, worse in speech.    They are unsure if she should restart the medication and if so they would need refill to [facility] in [place].

## 2021-07-02 ENCOUNTER — Telehealth: Payer: Self-pay

## 2021-07-02 ENCOUNTER — Other Ambulatory Visit: Payer: Self-pay | Admitting: Internal Medicine

## 2021-07-02 ENCOUNTER — Other Ambulatory Visit: Payer: Self-pay

## 2021-07-02 ENCOUNTER — Inpatient Hospital Stay: Payer: Medicare HMO

## 2021-07-02 DIAGNOSIS — C3412 Malignant neoplasm of upper lobe, left bronchus or lung: Secondary | ICD-10-CM | POA: Diagnosis not present

## 2021-07-02 DIAGNOSIS — Z923 Personal history of irradiation: Secondary | ICD-10-CM | POA: Diagnosis not present

## 2021-07-02 DIAGNOSIS — R197 Diarrhea, unspecified: Secondary | ICD-10-CM

## 2021-07-02 DIAGNOSIS — R112 Nausea with vomiting, unspecified: Secondary | ICD-10-CM

## 2021-07-02 DIAGNOSIS — C7931 Secondary malignant neoplasm of brain: Secondary | ICD-10-CM | POA: Diagnosis not present

## 2021-07-02 DIAGNOSIS — C771 Secondary and unspecified malignant neoplasm of intrathoracic lymph nodes: Secondary | ICD-10-CM | POA: Diagnosis not present

## 2021-07-02 DIAGNOSIS — R531 Weakness: Secondary | ICD-10-CM

## 2021-07-02 DIAGNOSIS — Z79899 Other long term (current) drug therapy: Secondary | ICD-10-CM | POA: Diagnosis not present

## 2021-07-02 LAB — C DIFFICILE QUICK SCREEN W PCR REFLEX
C Diff antigen: NEGATIVE
C Diff interpretation: NOT DETECTED
C Diff toxin: NEGATIVE

## 2021-07-02 MED ORDER — ONDANSETRON HCL 4 MG/2ML IJ SOLN
4.0000 mg | Freq: Once | INTRAMUSCULAR | Status: AC
  Administered 2021-07-02: 4 mg via INTRAVENOUS
  Filled 2021-07-02: qty 2

## 2021-07-02 MED ORDER — DEXAMETHASONE 1 MG PO TABS: 1.0000 mg | ORAL_TABLET | Freq: Every day | ORAL | 3 refills | Status: DC

## 2021-07-02 MED ORDER — SODIUM CHLORIDE 0.9 % IV SOLN: INTRAVENOUS | Status: DC

## 2021-07-02 NOTE — Telephone Encounter (Signed)
Pts daughter states pt has uncontrollable diarrhea and Imodium is not helping. Pt is negative for fever and has been taking Imodium after every BM.  Pt daughter requests rx for Lomotil?  Pt is here in the building at this time. Lab order for c-diff has been entered and pt provided collection cup. She states she will try to use it while she is here but if not, her daughter, [name], will bring it over to [facility] lab if after 4:30pm.

## 2021-07-02 NOTE — Patient Instructions (Signed)
Dehydration, Adult Dehydration is condition in which there is not enough water or other fluids in the body. This happens when a person loses more fluids than he or she takes in. Important body parts cannot work right without the right amount of fluids. Any loss of fluids from the body can cause dehydration. Dehydration can be mild, worse, or very bad. It should be treated right away to keep it from getting very bad. What are the causes? This condition may be caused by: Conditions that cause loss of water or other fluids, such as: Watery poop (diarrhea). Vomiting. Sweating a lot. Peeing (urinating) a lot. Not drinking enough fluids, especially when you: Are ill. Are doing things that take a lot of energy to do. Other illnesses and conditions, such as fever or infection. Certain medicines, such as medicines that take extra fluid out of the body (diuretics). Lack of safe drinking water. Not being able to get enough water and food. What increases the risk? The following factors may make you more likely to develop this condition: Having a long-term (chronic) illness that has not been treated the right way, such as: Diabetes. Heart disease. Kidney disease. Being 66 years of age or older. Having a disability. Living in a place that is high above the ground or sea (high in altitude). The thinner, dried air causes more fluid loss. Doing exercises that put stress on your body for a long time. What are the signs or symptoms? Symptoms of dehydration depend on how bad it is. Mild or worse dehydration Thirst. Dry lips or dry mouth. Feeling dizzy or light-headed, especially when you stand up from sitting. Muscle cramps. Your body making: Dark pee (urine). Pee may be the color of tea. Less pee than normal. Less tears than normal. Headache. Very bad dehydration Changes in skin. Skin may: Be cold to the touch (clammy). Be blotchy or pale. Not go back to normal right after you lightly pinch  it and let it go. Little or no tears, pee, or sweat. Changes in vital signs, such as: Fast breathing. Low blood pressure. Weak pulse. Pulse that is more than 100 beats a minute when you are sitting still. Other changes, such as: Feeling very thirsty. Eyes that look hollow (sunken). Cold hands and feet. Being mixed up (confused). Being very tired (lethargic) or having trouble waking from sleep. Short-term weight loss. Loss of consciousness. How is this treated? Treatment for this condition depends on how bad it is. Treatment should start right away. Do not wait until your condition gets very bad. Very bad dehydration is an emergency. You will need to go to a hospital. Mild or worse dehydration can be treated at home. You may be asked to: Drink more fluids. Drink an oral rehydration solution (ORS). This drink helps get the right amounts of fluids and salts and minerals in the blood (electrolytes). Very bad dehydration can be treated: With fluids through an IV tube. By getting normal levels of salts and minerals in your blood. This is often done by giving salts and minerals through a tube. The tube is passed through your nose and into your stomach. By treating the root cause. Follow these instructions at home: Oral rehydration solution If told by your doctor, drink an ORS: Make an ORS. Use instructions on the package. Start by drinking small amounts, about  cup (120 mL) every 5-10 minutes. Slowly drink more until you have had the amount that your doctor said to have. Eating and drinking  Drink enough clear fluid to keep your pee pale yellow. If you were told to drink an ORS, finish the ORS first. Then, start slowly drinking other clear fluids. Drink fluids such as: Water. Do not drink only water. Doing that can make the salt (sodium) level in your body get too low. Water from ice chips you suck on. Fruit juice that you have added water to (diluted). Low-calorie sports  drinks. Eat foods that have the right amounts of salts and minerals, such as: Bananas. Oranges. Potatoes. Tomatoes. Spinach. Do not drink alcohol. Avoid: Drinks that have a lot of sugar. These include: High-calorie sports drinks. Fruit juice that you did not add water to. Soda. Caffeine. Foods that are greasy or have a lot of fat or sugar. General instructions Take over-the-counter and prescription medicines only as told by your doctor. Do not take salt tablets. Doing that can make the salt level in your body get too high. Return to your normal activities as told by your doctor. Ask your doctor what activities are safe for you. Keep all follow-up visits as told by your doctor. This is important. Contact a doctor if: You have pain in your belly (abdomen) and the pain: Gets worse. Stays in one place. You have a rash. You have a stiff neck. You get angry or annoyed (irritable) more easily than normal. You are more tired or have a harder time waking than normal. You feel: Weak or dizzy. Very thirsty. Get help right away if you have: Any symptoms of very bad dehydration. Symptoms of vomiting, such as: You cannot eat or drink without vomiting. Your vomiting gets worse or does not go away. Your vomit has blood or green stuff in it. Symptoms that get worse with treatment. A fever. A very bad headache. Problems with peeing or pooping (having a bowel movement), such as: Watery poop that gets worse or does not go away. Blood in your poop (stool). This may cause poop to look black and tarry. Not peeing in 6-8 hours. Peeing only a small amount of very dark pee in 6-8 hours. Trouble breathing. These symptoms may be an emergency. Do not wait to see if the symptoms will go away. Get medical help right away. Call your local emergency services (911 in the [place] Do not drive yourself to the hospital. Summary Dehydration is a condition in which there is not enough water or other fluids  in the body. This happens when a person loses more fluids than he or she takes in. Treatment for this condition depends on how bad it is. Treatment should be started right away. Do not wait until your condition gets very bad. Drink enough clear fluid to keep your pee pale yellow. If you were told to drink an oral rehydration solution (ORS), finish the ORS first. Then, start slowly drinking other clear fluids. Take over-the-counter and prescription medicines only as told by your doctor. Get help right away if you have any symptoms of very bad dehydration. This information is not intended to replace advice given to you by your health care provider. Make sure you discuss any questions you have with your health care provider. Document Revised: 04/11/2019 Document Reviewed: 04/11/2019 Elsevier Patient Education  [place].

## 2021-07-03 ENCOUNTER — Other Ambulatory Visit: Payer: Self-pay | Admitting: Internal Medicine

## 2021-07-03 MED ORDER — DIPHENOXYLATE-ATROPINE 2.5-0.025 MG PO TABS: 1.0000 | ORAL_TABLET | Freq: Four times a day (QID) | ORAL | 0 refills | Status: DC | PRN

## 2021-07-05 ENCOUNTER — Other Ambulatory Visit: Payer: Self-pay | Admitting: Physician Assistant

## 2021-07-08 ENCOUNTER — Other Ambulatory Visit: Payer: Self-pay

## 2021-07-08 ENCOUNTER — Ambulatory Visit (HOSPITAL_COMMUNITY)
Admission: RE | Admit: 2021-07-08 | Discharge: 2021-07-08 | Disposition: A | Discharge status: Home / Self Care | Payer: Medicare HMO | Source: Ambulatory Visit | Attending: Radiation Oncology | Admitting: Radiation Oncology

## 2021-07-08 ENCOUNTER — Other Ambulatory Visit: Payer: Medicare HMO

## 2021-07-08 DIAGNOSIS — G9389 Other specified disorders of brain: Secondary | ICD-10-CM | POA: Diagnosis not present

## 2021-07-08 DIAGNOSIS — C7931 Secondary malignant neoplasm of brain: Secondary | ICD-10-CM | POA: Insufficient documentation

## 2021-07-08 DIAGNOSIS — C719 Malignant neoplasm of brain, unspecified: Secondary | ICD-10-CM | POA: Diagnosis not present

## 2021-07-08 DIAGNOSIS — C3490 Malignant neoplasm of unspecified part of unspecified bronchus or lung: Secondary | ICD-10-CM | POA: Diagnosis not present

## 2021-07-08 IMAGING — MR MR HEAD WO/W CM
10 of 14 series · 22 of 48 positions shown · IV contrast (7 ML gad)
Comparison: [DATE]

CLINICAL DATA: Brain/CNS neoplasm, surveillance [REDACTED] Protocol.
Follow-up of treated metastatic lung cancer

EXAM:
MRI HEAD WITHOUT AND WITH CONTRAST
TECHNIQUE: Multiplanar, multiecho pulse sequences of the brain and surrounding
structures were obtained without and with intravenous contrast.
CONTRAST:  7mL GADAVIST GADOBUTROL 1 MMOL/ML IV SOLN

[Series 2: FLAIR · sagittal · 3.0mm · 0.47mm/px · 1 of 45 slices shown (1 of 2)]
[im 1/45]
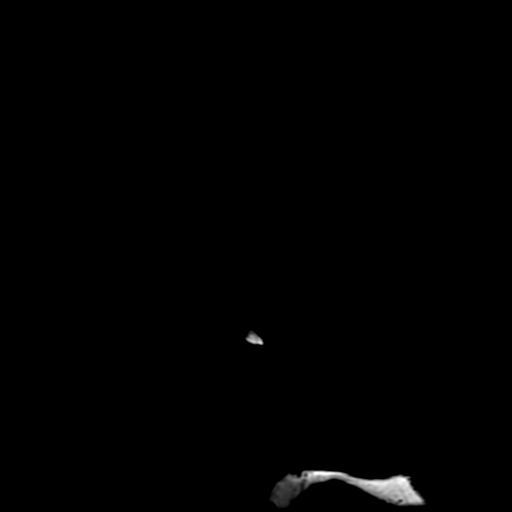

[Series 3: DWI · axial · 3.0mm · 0.94mm/px · z∈[-78,+118]mm · 3 of 132 slices shown]
[im 1/132]
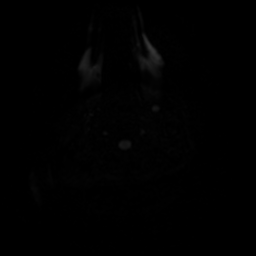
[im 66/132]
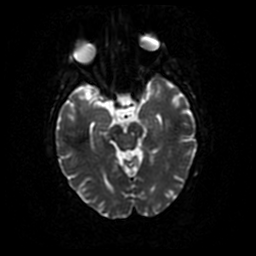
[im 132/132]
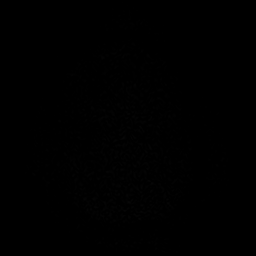

[Series 4: FLAIR · axial · 3.0mm · 0.47mm/px · z∈[-83,+112]mm · 2 of 66 slices shown (2 of 2)]
[im 1/66]
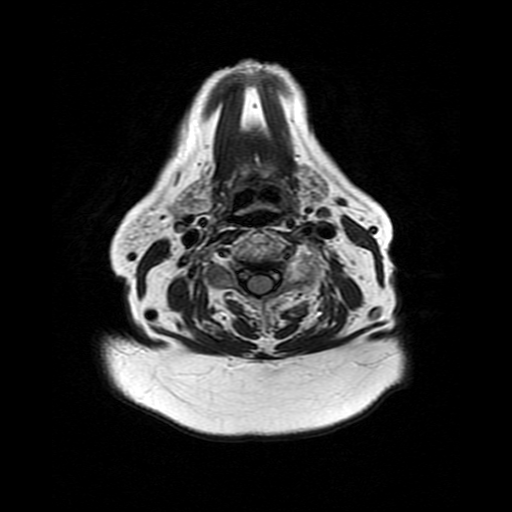
[im 66/66]
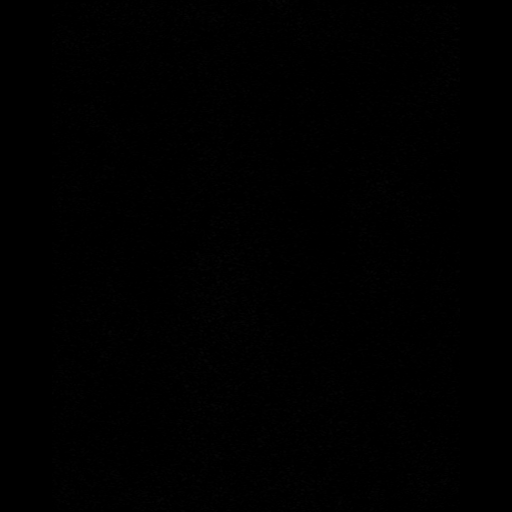

[Series 5: SWI · axial · 3.0mm · 0.47mm/px · z∈[-91,+108]mm · 3 of 134 slices shown]
[im 1/134]
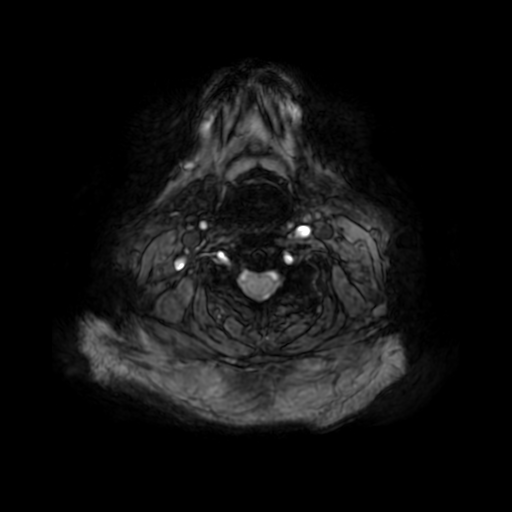
[im 67/134]
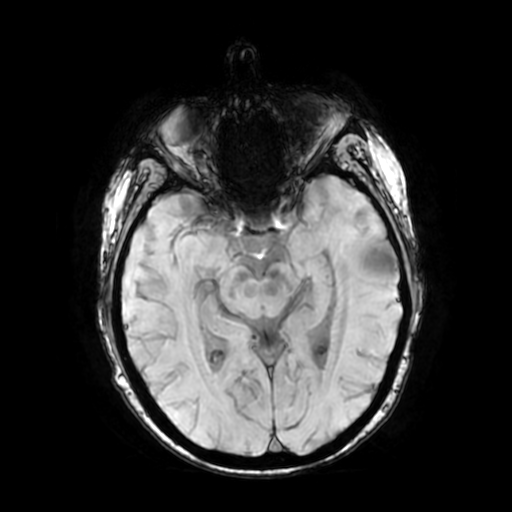
[im 134/134]
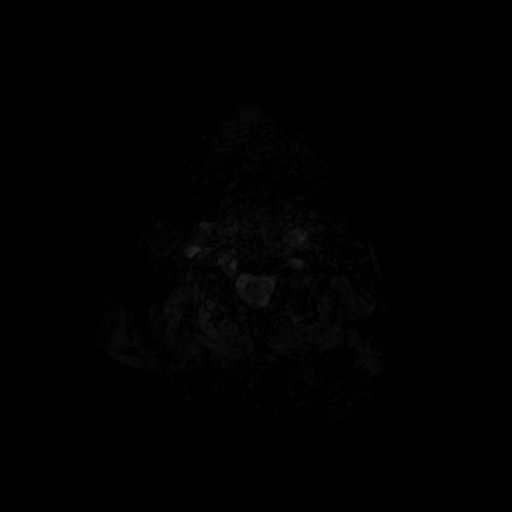

[Series 7: T2 post-contrast · coronal · 3.0mm · 0.39mm/px · 1 of 54 slices shown (1 of 2)]
[im 1/54]
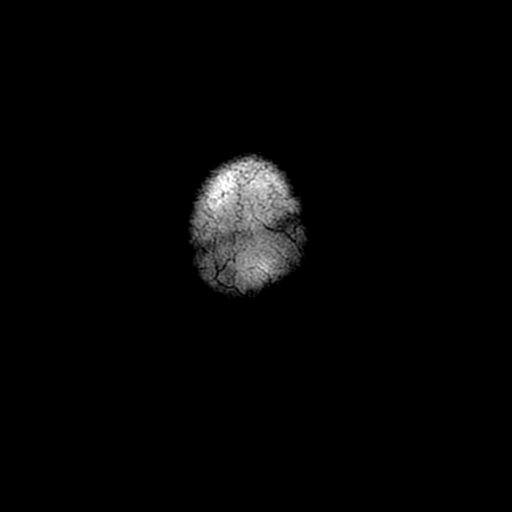

[Series 8: T2 post-contrast · axial · 5.0mm · 0.47mm/px · 1 of 32 slices shown (2 of 2)]
[im 1/32]
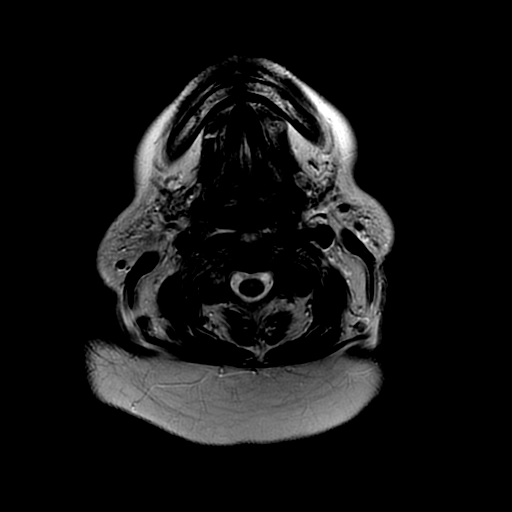

[Series 9: T1 post-contrast · coronal · 3.0mm · 0.39mm/px · 1 of 54 slices shown (1 of 2)]
[im 1/54]
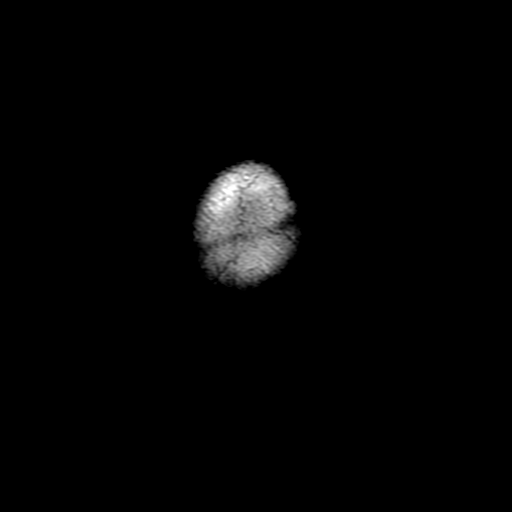

[Series 10: FLAIR post-contrast · sagittal · 3.0mm · 0.47mm/px · 1 of 45 slices shown]
[im 1/45]
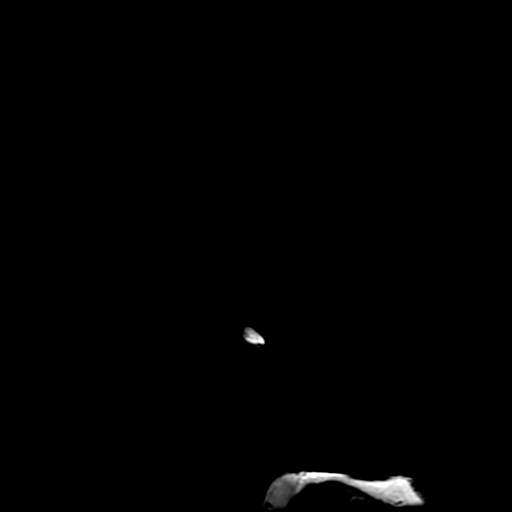

[Series 350: ADC · axial · 3.0mm · 0.94mm/px · z∈[-78,+118]mm · 2 of 67 slices shown]
[im 1/67]
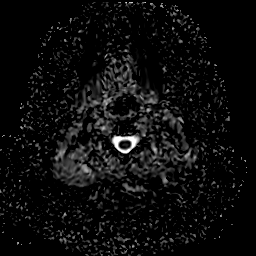
[im 67/67]
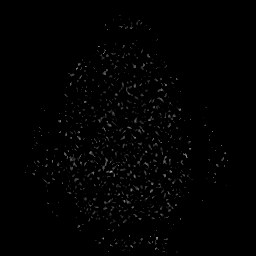

[Series 1100: T1 post-contrast · axial · 0.9mm · 0.50mm/px · z∈[-134,+121]mm · 7 of 301 slices shown (2 of 2)]
[im 1/301]
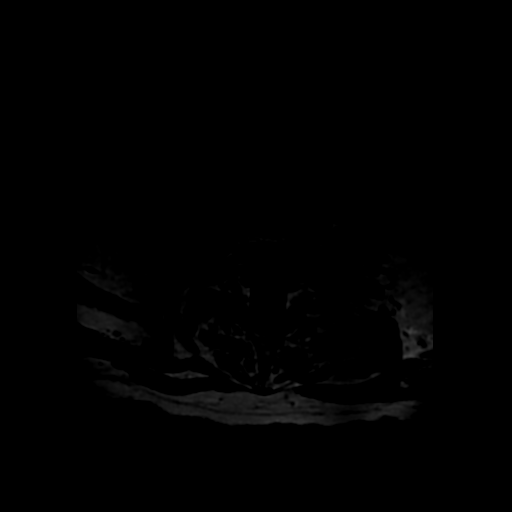
[im 51/301]
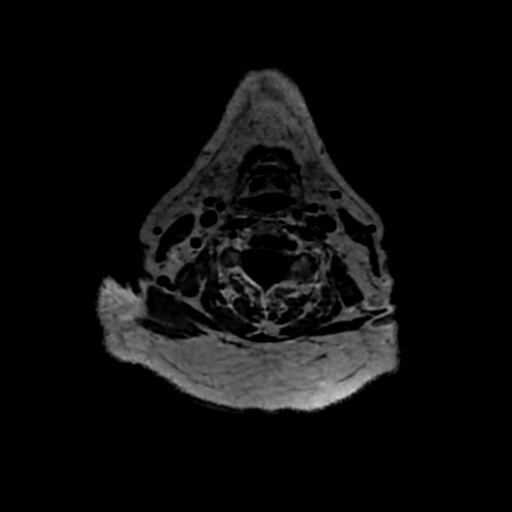
[im 101/301]
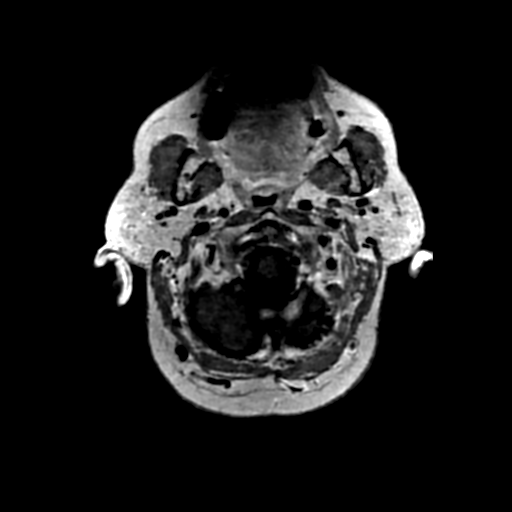
[im 151/301]
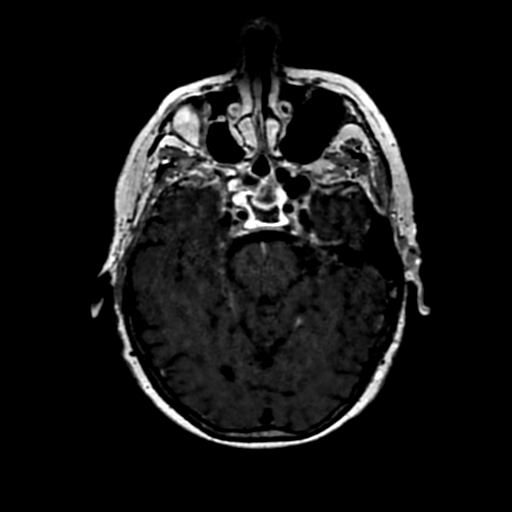
[im 201/301]
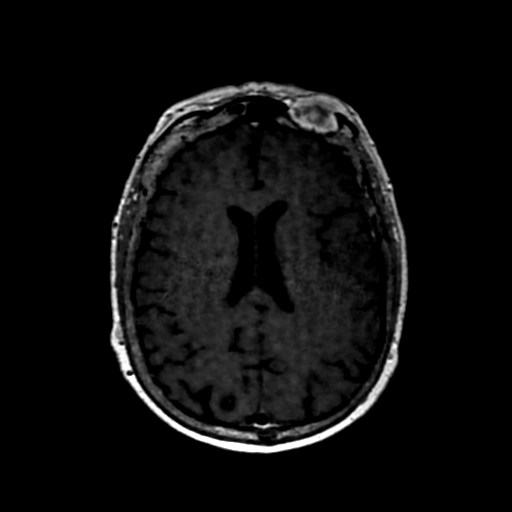
[im 251/301]
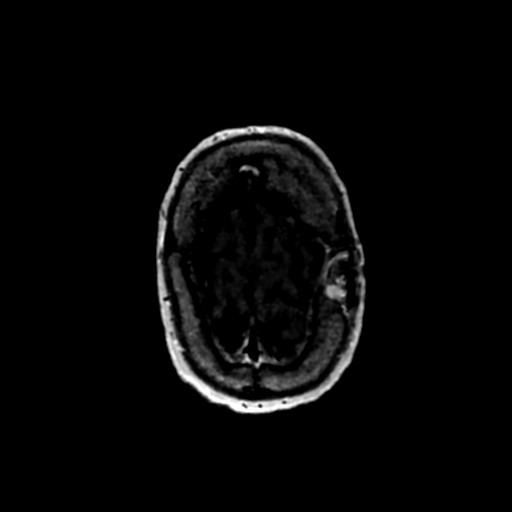
[im 301/301]
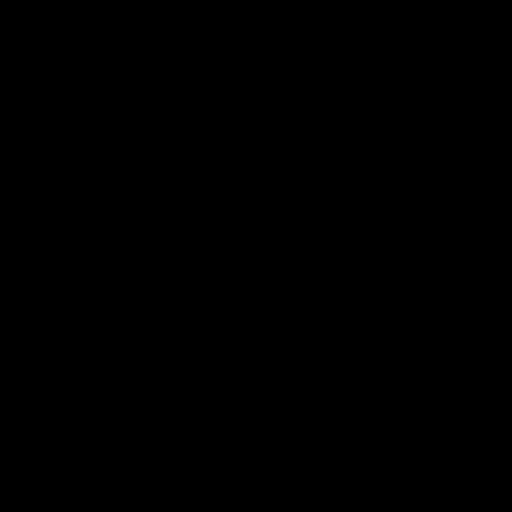

[22 of 48 positions shown; findings below may reference images not displayed]

FINDINGS: Brain: Evolution of postoperative changes since the prior study. A
small remaining heterogeneous resection cavity is present in the
left frontal lobe involving the precentral gyrus just deep to the
craniotomy. There is surrounding enhancement. Decreased adjacent T2
FLAIR hyperintensity. Linear encephalomalacia deep to the resection
cavity reflecting sequelae infarct.

No acute infarction. No new mass or abnormal enhancement. No
hydrocephalus.

Vascular: Major vessel flow voids at the skull base are preserved.

Skull and upper cervical spine: Normal marrow signal is preserved.
Left parietal craniotomy.

Sinuses/Orbits: Sphenoid sinus lobular mucosal thickening. Bilateral
lens replacements.

Other: Sella is unremarkable.  Mastoid air cells are clear.
IMPRESSION: Enhancement about the left frontal resection cavity is likely
postoperative and treatment related. Decreased adjacent T2 FLAIR
hyperintensity probably primarily represents gliosis or less likely
edema.

No evidence of new metastatic disease.

## 2021-07-08 MED ORDER — GADOBUTROL 1 MMOL/ML IV SOLN
7.0000 mL | Freq: Once | INTRAVENOUS | Status: AC | PRN
  Administered 2021-07-08: 7 mL via INTRAVENOUS

## 2021-07-08 NOTE — Telephone Encounter (Signed)
Pts daughter LM requesting IVF again for pt (last received 10/21).   I've called her back and she states pt has ongoing diarrhea, fatigue, and a little increased SOBE. Pt was also present on the phone and she states she has 4-5 uncontrolled diarrhea episodes daily even with the Lomotil. Pt states she is drinking about 50oz of fluids at least a day. Denies fever.

## 2021-07-09 ENCOUNTER — Encounter: Payer: Medicare HMO | Admitting: Physician Assistant

## 2021-07-09 ENCOUNTER — Other Ambulatory Visit: Payer: Self-pay | Admitting: Physician Assistant

## 2021-07-09 ENCOUNTER — Telehealth: Payer: Self-pay | Admitting: Internal Medicine

## 2021-07-09 ENCOUNTER — Other Ambulatory Visit: Payer: Medicare HMO

## 2021-07-09 ENCOUNTER — Ambulatory Visit: Payer: Medicare HMO

## 2021-07-09 DIAGNOSIS — R197 Diarrhea, unspecified: Secondary | ICD-10-CM

## 2021-07-09 DIAGNOSIS — C3412 Malignant neoplasm of upper lobe, left bronchus or lung: Secondary | ICD-10-CM

## 2021-07-09 NOTE — Telephone Encounter (Signed)
Pt has been rescheduled per 10/28 pt request

## 2021-07-09 NOTE — Progress Notes (Signed)
Patient unable to come today for IV fluids but is scheduled to come in tomorrow instead. Orders entered under sign and hold for 1 L over 2 hours.  Will call the patient and let her know to hold her tagrisso for a few days until she sees Dr. Julien Nordmann next week.

## 2021-07-10 ENCOUNTER — Inpatient Hospital Stay: Payer: Medicare HMO

## 2021-07-10 ENCOUNTER — Other Ambulatory Visit: Payer: Self-pay

## 2021-07-10 DIAGNOSIS — C7931 Secondary malignant neoplasm of brain: Secondary | ICD-10-CM | POA: Diagnosis not present

## 2021-07-10 DIAGNOSIS — Z79899 Other long term (current) drug therapy: Secondary | ICD-10-CM | POA: Diagnosis not present

## 2021-07-10 DIAGNOSIS — Z923 Personal history of irradiation: Secondary | ICD-10-CM | POA: Diagnosis not present

## 2021-07-10 DIAGNOSIS — C3412 Malignant neoplasm of upper lobe, left bronchus or lung: Secondary | ICD-10-CM | POA: Diagnosis not present

## 2021-07-10 DIAGNOSIS — C771 Secondary and unspecified malignant neoplasm of intrathoracic lymph nodes: Secondary | ICD-10-CM | POA: Diagnosis not present

## 2021-07-10 DIAGNOSIS — R197 Diarrhea, unspecified: Secondary | ICD-10-CM

## 2021-07-10 MED ORDER — SODIUM CHLORIDE 0.9 % IV SOLN
Freq: Once | INTRAVENOUS | Status: AC
Start: 2021-07-10 — End: 2021-07-10

## 2021-07-10 NOTE — Patient Instructions (Signed)
Rehydration, Elderly Rehydration is the replacement of body fluids, salts, and minerals (electrolytes) that are lost during dehydration. Dehydration is when there is not enough water or other fluids in the body. This happens when you lose more fluids than you take in. People who are age 77 or older have a higher risk of dehydration than younger adults. Common causes of dehydration include: Conditions that cause loss of water or other fluids, such as diarrhea, vomiting, sweating, or urinating a lot. Not drinking enough fluids. This can occur when you are ill or doing activities that require a lot of energy, especially in hot weather. Other illnesses and conditions, such as fever or infection. Certain medicines, such as those that remove excess fluid from the body (diuretics). Not being able to get enough water and food. Symptoms of mild or moderate dehydration may include thirst, dry lips and mouth, and dizziness. Symptoms of severe dehydration may include increased heart rate, confusion, fainting, and not urinating. For severe dehydration, you may need to get fluids through an IV at the hospital. For mild or moderate dehydration, you can usually rehydrate at home by drinking certain fluids as told by your health care provider. What are the risks? Generally, rehydration is safe. However, taking in too much fluid (overhydration) can be a problem. This is rare. Overhydration can cause an electrolyte imbalance, kidney failure, fluid in the lungs, or a decrease in salt (sodium) levels in the body. Supplies needed: You will need an oral rehydration solution (ORS) if your health care provider tells you to use one. This is a drink designed to treat dehydration. It can be found in pharmacies and retail stores. How to rehydrate Fluids Follow instructions from your health care provider for rehydration. The kind of fluid and the amount you should drink depend on your condition. In general, for mild dehydration,  you should choose drinks that you prefer. If told by your health care provider, drink an ORS. Make an ORS by following instructions on the package. Start by drinking small amounts, about  cup (120 mL) every 5-10 minutes. Slowly increase how much you drink until you have taken the amount recommended by your health care provider. Drink enough fluids to keep your urine pale yellow. If you were told to drink an ORS, finish the ORS first, then start slowly drinking other clear fluids. Drink fluids such as: Water. This includes sparkling water and flavored water. Drinking only waterwhile rehydrating can lead to having too little sodium in your body (hyponatremia). Follow instructions from your health care provider. Water from ice chips you suck on. Fruit juice with water you add to it(diluted). Sports drinks. Hot or cold herbal teas. Broth-based soups. Coffee. Milk or milk products. Food Follow instructions from your health care provider about what to eat while you rehydrate. Your health care provider may recommend that you slowly begin eating regular foods in small amounts. Eat foods that contain a healthy balance of electrolytes, such as bananas, oranges, potatoes, tomatoes, and spinach. Avoid foods that are greasy or contain a lot of sugar. In some cases, you may get nutrition through a feeding tube that is passed through your nose and into your stomach (nasogastric tube, or NG tube). This may be done if you have uncontrolled vomiting or diarrhea. Beverages to avoid Certain beverages may make dehydration worse. While you rehydrate, avoid drinking alcohol. How to tell if you are recovering from dehydration You may be recovering from dehydration if: You are urinating more often than before you  started rehydrating. Your urine is pale yellow. Your energy level improves. You vomit less frequently. You have diarrhea less frequently. Your appetite improves or returns to normal. You feel less  dizzy or less light-headed. Your skin tone and color start to look more normal. Follow these instructions at home: Take over-the-counter and prescription medicines only as told by your health care provider. Do not take sodium tablets. Doing this can lead to having too much sodium in your body (hypernatremia). Contact a health care provider if: You continue to have symptoms of mild or moderate dehydration, such as: Thirst. Dry lips. Slightly dry mouth. Dizziness. Dark urine or less urine than usual. Muscle cramps. You continue to vomit or have diarrhea. Get help right away if you: Have symptoms of dehydration that get worse. Have a fever. Have a severe headache. Have been vomiting and the following happens: Your vomiting gets worse. Your vomit includes blood or green matter (bile). You cannot eat or drink without vomiting. Have problems with urination or bowel movements, such as: Diarrhea that gets worse. Blood in your stool (feces). This may cause stool to look black and tarry. Not urinating, or urinating only a small amount of very dark urine, within 6-8 hours. Have trouble breathing. Have symptoms that get worse with treatment. These symptoms may represent a serious problem that is an emergency. Do not wait to see if the symptoms will go away. Get medical help right away. Call your local emergency services (911 in the [place] Do not drive yourself to the hospital. Summary Rehydration is the replacement of body fluids, salts, and minerals (electrolytes) that are lost during dehydration. Follow instructions from your health care provider for rehydration. The kind of fluid and the amount you should drink depend on your condition. Slowly increase how much you drink until you have taken the amount recommended by your health care provider. Contact your health care provider if you continue to show signs of mild or moderate dehydration. This information is not intended to replace advice  given to you by your health care provider. Make sure you discuss any questions you have with your health care provider. Document Revised: 10/30/2019 Document Reviewed: 10/17/2019 Elsevier Patient Education  [place].

## 2021-07-12 ENCOUNTER — Inpatient Hospital Stay: Payer: Medicare HMO

## 2021-07-13 ENCOUNTER — Inpatient Hospital Stay (HOSPITAL_BASED_OUTPATIENT_CLINIC_OR_DEPARTMENT_OTHER): Payer: Medicare HMO | Admitting: Internal Medicine

## 2021-07-13 ENCOUNTER — Telehealth: Payer: Self-pay

## 2021-07-13 ENCOUNTER — Ambulatory Visit: Payer: Medicare HMO

## 2021-07-13 ENCOUNTER — Inpatient Hospital Stay: Payer: Medicare HMO | Admitting: Physician Assistant

## 2021-07-13 ENCOUNTER — Other Ambulatory Visit: Payer: Self-pay

## 2021-07-13 ENCOUNTER — Inpatient Hospital Stay: Payer: Medicare HMO | Attending: Internal Medicine | Admitting: Internal Medicine

## 2021-07-13 ENCOUNTER — Other Ambulatory Visit: Payer: Self-pay | Admitting: Internal Medicine

## 2021-07-13 ENCOUNTER — Inpatient Hospital Stay: Payer: Medicare HMO

## 2021-07-13 ENCOUNTER — Encounter: Payer: Self-pay | Admitting: Urology

## 2021-07-13 ENCOUNTER — Encounter: Payer: Self-pay | Admitting: Internal Medicine

## 2021-07-13 ENCOUNTER — Encounter: Payer: Self-pay | Admitting: Physician Assistant

## 2021-07-13 VITALS — BP 137/64 | HR 58 | Temp 98.0°F | Resp 16 | Ht 64.0 in | Wt 151.0 lb

## 2021-07-13 VITALS — BP 101/73 | HR 79 | Temp 97.1°F | Resp 17 | Wt 151.0 lb

## 2021-07-13 DIAGNOSIS — Z87891 Personal history of nicotine dependence: Secondary | ICD-10-CM | POA: Diagnosis not present

## 2021-07-13 DIAGNOSIS — R5383 Other fatigue: Secondary | ICD-10-CM | POA: Diagnosis not present

## 2021-07-13 DIAGNOSIS — C7931 Secondary malignant neoplasm of brain: Secondary | ICD-10-CM | POA: Diagnosis not present

## 2021-07-13 DIAGNOSIS — F32A Depression, unspecified: Secondary | ICD-10-CM | POA: Insufficient documentation

## 2021-07-13 DIAGNOSIS — Z923 Personal history of irradiation: Secondary | ICD-10-CM | POA: Insufficient documentation

## 2021-07-13 DIAGNOSIS — R569 Unspecified convulsions: Secondary | ICD-10-CM | POA: Diagnosis not present

## 2021-07-13 DIAGNOSIS — R69 Illness, unspecified: Secondary | ICD-10-CM | POA: Diagnosis not present

## 2021-07-13 DIAGNOSIS — E876 Hypokalemia: Secondary | ICD-10-CM | POA: Insufficient documentation

## 2021-07-13 DIAGNOSIS — R197 Diarrhea, unspecified: Secondary | ICD-10-CM | POA: Diagnosis not present

## 2021-07-13 DIAGNOSIS — I10 Essential (primary) hypertension: Secondary | ICD-10-CM | POA: Insufficient documentation

## 2021-07-13 DIAGNOSIS — R112 Nausea with vomiting, unspecified: Secondary | ICD-10-CM

## 2021-07-13 DIAGNOSIS — C3412 Malignant neoplasm of upper lobe, left bronchus or lung: Secondary | ICD-10-CM

## 2021-07-13 DIAGNOSIS — Z5111 Encounter for antineoplastic chemotherapy: Secondary | ICD-10-CM

## 2021-07-13 DIAGNOSIS — E860 Dehydration: Secondary | ICD-10-CM | POA: Insufficient documentation

## 2021-07-13 DIAGNOSIS — E785 Hyperlipidemia, unspecified: Secondary | ICD-10-CM | POA: Insufficient documentation

## 2021-07-13 DIAGNOSIS — Z79899 Other long term (current) drug therapy: Secondary | ICD-10-CM | POA: Diagnosis not present

## 2021-07-13 DIAGNOSIS — C771 Secondary and unspecified malignant neoplasm of intrathoracic lymph nodes: Secondary | ICD-10-CM | POA: Insufficient documentation

## 2021-07-13 LAB — CBC WITH DIFFERENTIAL (CANCER CENTER ONLY)
Abs Immature Granulocytes: 0.04 10*3/uL (ref 0.00–0.07)
Basophils Absolute: 0 10*3/uL (ref 0.0–0.1)
Basophils Relative: 0 %
Eosinophils Absolute: 0.2 10*3/uL (ref 0.0–0.5)
Eosinophils Relative: 3 %
HCT: 33.4 % — ABNORMAL LOW (ref 36.0–46.0)
Hemoglobin: 11.3 g/dL — ABNORMAL LOW (ref 12.0–15.0)
Immature Granulocytes: 1 %
Lymphocytes Relative: 35 %
Lymphs Abs: 2.3 10*3/uL (ref 0.7–4.0)
MCH: 30.4 pg (ref 26.0–34.0)
MCHC: 33.8 g/dL (ref 30.0–36.0)
MCV: 89.8 fL (ref 80.0–100.0)
Monocytes Absolute: 0.8 10*3/uL (ref 0.1–1.0)
Monocytes Relative: 12 %
Neutro Abs: 3.3 10*3/uL (ref 1.7–7.7)
Neutrophils Relative %: 49 %
Platelet Count: 225 10*3/uL (ref 150–400)
RBC: 3.72 MIL/uL — ABNORMAL LOW (ref 3.87–5.11)
RDW: 14.3 % (ref 11.5–15.5)
WBC Count: 6.7 10*3/uL (ref 4.0–10.5)
nRBC: 0 % (ref 0.0–0.2)

## 2021-07-13 LAB — CMP (CANCER CENTER ONLY)
ALT: 48 U/L — ABNORMAL HIGH (ref 0–44)
AST: 36 U/L (ref 15–41)
Albumin: 3.4 g/dL — ABNORMAL LOW (ref 3.5–5.0)
Alkaline Phosphatase: 49 U/L (ref 38–126)
Anion gap: 7 (ref 5–15)
BUN: 26 mg/dL — ABNORMAL HIGH (ref 8–23)
CO2: 20 mmol/L — ABNORMAL LOW (ref 22–32)
Calcium: 9 mg/dL (ref 8.9–10.3)
Chloride: 110 mmol/L (ref 98–111)
Creatinine: 1.93 mg/dL — ABNORMAL HIGH (ref 0.44–1.00)
GFR, Estimated: 27 mL/min — ABNORMAL LOW (ref >60)
Glucose, Bld: 120 mg/dL — ABNORMAL HIGH (ref 70–99)
Potassium: 2.2 mmol/L — CL (ref 3.5–5.1)
Sodium: 137 mmol/L (ref 135–145)
Total Bilirubin: 0.4 mg/dL (ref 0.3–1.2)
Total Protein: 6.5 g/dL (ref 6.5–8.1)

## 2021-07-13 LAB — MAGNESIUM: Magnesium: 2 mg/dL (ref 1.7–2.4)

## 2021-07-13 MED ORDER — POTASSIUM CHLORIDE IN NACL 40-0.9 MEQ/L-% IV SOLN
Freq: Once | INTRAVENOUS | Status: DC
  Filled 2021-07-13: qty 1000

## 2021-07-13 MED ORDER — OSIMERTINIB MESYLATE 40 MG PO TABS: 80.0000 mg | ORAL_TABLET | Freq: Every day | ORAL | 2 refills | Status: DC

## 2021-07-13 MED ORDER — OSIMERTINIB MESYLATE 40 MG PO TABS: 40.0000 mg | ORAL_TABLET | Freq: Every day | ORAL | 2 refills | Status: DC

## 2021-07-13 NOTE — Progress Notes (Signed)
[place] Telephone:(336) [phone]   Fax:(336) [phone]  OFFICE PROGRESS NOTE  Marie Noe, MD [place] [address]  DIAGNOSIS: Stage IV (T1c, N1, M1c) non-small cell lung cancer, adenocarcinoma presented with locally advanced disease in the left lung  with a left upper lobe lung nodule in addition to left hilar adenopathy and a solitary left frontal brain metastasis s/p resection of the brain tumor with SRS.  This was diagnosed in July 2022.   Biomarker Findings Tumor Mutational Burden - 43 Muts/Mb Microsatellite status - MS-Stable Genomic Findings For a complete list of the genes assayed, please refer to the Appendix. EGFR L861Q MTAP loss CDKN2A/B CDKN2A loss, CDKN2B loss CUL4A amplification - equivocal? IRS2 amplification SF3B1 K666N TERT promoter -124C>T TP53 N247I 7 Disease relevant genes with no reportable alterations: ALK, BRAF, ERBB2, KRAS, MET, RET, ROS1   PDL1 Expression 95%   PRIOR THERAPY: Resection followed by [facility] of the solitary frontal lobe brain metastasis on July 28th, 2022    CURRENT THERAPY: Targeted treatment with Tagrisso 80 mg p.o. daily. First dose 05/27/21.  INTERVAL HISTORY: Marie Hensley 75 y.o. female returns to the clinic today for follow-up visit accompanied by her daughter.  The patient is feeling much better today but for the last 7-10 days she had been complaining of significant diarrhea.  She was treated with Imodium with no improvement.  We added Lomotil with no improvement.  I asked the patient to hold her treatment with Tagrisso and she stopped taking it since July 09, 2021.  Her diarrhea has significantly improved after stopping her treatment with Tagrisso.  She also received IV hydration with normal saline on 07/10/2021.  She denied having any current chest pain, shortness of breath, cough or hemoptysis.  She denied having any fever or chills.  She has no nausea, vomiting, diarrhea or  constipation she has no headache or visual changes.  She is here today for evaluation and repeat blood work.  MEDICAL HISTORY: Past Medical History:  Diagnosis Date   Arthritis    Brain tumor ([place])    Depression    Dyspnea    WITH EXERTION-SEEING DR [name]   HTN (hypertension)    Hyperlipemia    Seizures (H[facility])     ALLERGIES:  has No Known Allergies.  MEDICATIONS:  Current Outpatient Medications  Medication Sig Dispense Refill   acetaminophen (TYLENOL) 500 MG tablet Take 1,000 mg by mouth every 6 (six) hours as needed for mild pain (or arthritis).     atorvastatin (LIPITOR) 40 MG tablet Take 1 tablet (40 mg total) by mouth at bedtime. 90 tablet 3   cetirizine (ZYRTEC) 10 MG tablet Take 10 mg by mouth daily as needed for allergies or rhinitis.     citalopram (CELEXA) 20 MG tablet Take 1 tablet (20 mg total) by mouth at bedtime. 90 tablet 1   dexamethasone (DECADRON) 1 MG tablet Take 1 tablet (1 mg total) by mouth daily. 30 tablet 3   diphenoxylate-atropine (LOMOTIL) 2.5-0.025 MG tablet Take 1 tablet by mouth 4 (four) times daily as needed for diarrhea or loose stools. 30 tablet 0   fluticasone (FLONASE) 50 MCG/ACT nasal spray Place 1 spray into both nostrils daily as needed for allergies.     HYDROcodone-acetaminophen (NORCO/VICODIN) 5-325 MG tablet Take 1 tablet by mouth every 4 (four) hours as needed for moderate pain. 30 tablet 0   levETIRAcetam (KEPPRA) 500 MG tablet Take 2 tablets (1,000 mg  total) by mouth 2 (two) times daily. 120 tablet 3   lisinopril-hydrochlorothiazide (ZESTORETIC) 10-12.5 MG tablet Take 1 tablet by mouth daily. 90 tablet 3   loperamide (IMODIUM A-D) 2 MG tablet Take 2 mg by mouth 2 (two) times daily as needed for diarrhea or loose stools.     osimertinib mesylate (TAGRISSO) 80 MG tablet Take 1 tablet (80 mg total) by mouth daily. 30 tablet 3   prochlorperazine (COMPAZINE) 10 MG tablet Take 1 tablet (10 mg total) by mouth every 6 (six) hours as needed for  nausea or vomiting. 30 tablet 0   propranolol ER (INDERAL LA) 60 MG 24 hr capsule Take 1 capsule (60 mg total) by mouth at bedtime. 90 capsule 3   No current facility-administered medications for this visit.    SURGICAL HISTORY:  Past Surgical History:  Procedure Laterality Date   APPENDECTOMY  8115   APPLICATION OF CRANIAL NAVIGATION N/A 04/08/2021   Procedure: APPLICATION OF CRANIAL NAVIGATION;  Surgeon: Judith Part, MD;  Location: [facility];  Service: Neurosurgery;  Laterality: N/A;  RM 21   CRANIOTOMY Left 04/08/2021   Procedure: Left Craniotomy for tumor resection with brainlab;  Surgeon: Judith Part, MD;  Location: [facility];  Service: Neurosurgery;  Laterality: Left;   EYE SURGERY Bilateral    CATARACTS   OOPHORECTOMY Right    VIDEO BRONCHOSCOPY WITH ENDOBRONCHIAL NAVIGATION Left 12/23/2020   Procedure: VIDEO BRONCHOSCOPY WITH ENDOBRONCHIAL NAVIGATION;  Surgeon: Tyler Pita, MD;  Location: [facility];  Service: Pulmonary;  Laterality: Left;   VIDEO BRONCHOSCOPY WITH ENDOBRONCHIAL NAVIGATION Left 02/01/2021   Procedure: ROBOTIC ASSISTED VIDEO BRONCHOSCOPY WITH ENDOBRONCHIAL NAVIGATION;  Surgeon: Tyler Pita, MD;  Location: [facility];  Service: Pulmonary;  Laterality: Left;   VIDEO BRONCHOSCOPY WITH ENDOBRONCHIAL ULTRASOUND Left 12/23/2020   Procedure: VIDEO BRONCHOSCOPY WITH ENDOBRONCHIAL ULTRASOUND;  Surgeon: Tyler Pita, MD;  Location: [facility];  Service: Pulmonary;  Laterality: Left;   VIDEO BRONCHOSCOPY WITH ENDOBRONCHIAL ULTRASOUND Left 02/01/2021   Procedure: ROBOTIC ASSITSTED VIDEO BRONCHOSCOPY WITH ENDOBRONCHIAL ULTRASOUND;  Surgeon: Tyler Pita, MD;  Location: [facility];  Service: Pulmonary;  Laterality: Left;    REVIEW OF SYSTEMS:  Constitutional: positive for fatigue Eyes: negative Ears, nose, mouth, throat, and face: negative Respiratory: negative Cardiovascular: negative Gastrointestinal: negative Genitourinary:negative Integument/breast:  negative Hematologic/lymphatic: negative Musculoskeletal:negative Neurological: negative Behavioral/Psych: negative Endocrine: negative Allergic/Immunologic: negative   PHYSICAL EXAMINATION: General appearance: alert, cooperative, fatigued, and no distress Head: Normocephalic, without obvious abnormality, atraumatic Neck: no adenopathy, no JVD, supple, symmetrical, trachea midline, and thyroid not enlarged, symmetric, no tenderness/mass/nodules Lymph nodes: Cervical, supraclavicular, and axillary nodes normal. Resp: clear to auscultation bilaterally Back: symmetric, no curvature. ROM normal. No CVA tenderness. Cardio: regular rate and rhythm, S1, S2 normal, no murmur, click, rub or gallop GI: soft, non-tender; bowel sounds normal; no masses,  no organomegaly Extremities: extremities normal, atraumatic, no cyanosis or edema Neurologic: Alert and oriented X 3, normal strength and tone. Normal symmetric reflexes. Normal coordination and gait  ECOG PERFORMANCE STATUS: 1 - Symptomatic but completely ambulatory  Blood pressure 101/73, pulse 79, temperature (!) 97.1 F (36.2 C), temperature source Tympanic, resp. rate 17, weight 151 lb (68.5 kg), SpO2 99 %.  LABORATORY DATA: Lab Results  Component Value Date   WBC 6.7 07/13/2021   HGB 11.3 (L) 07/13/2021   HCT 33.4 (L) 07/13/2021   MCV 89.8 07/13/2021   PLT 225 07/13/2021      Chemistry      Component Value Date/Time  NA 137 07/13/2021 0934   K 2.2 (LL) 07/13/2021 0934   CL 110 07/13/2021 0934   CO2 20 (L) 07/13/2021 0934   BUN 26 (H) 07/13/2021 0934   CREATININE 1.93 (H) 07/13/2021 0934      Component Value Date/Time   CALCIUM 9.0 07/13/2021 0934   ALKPHOS 49 07/13/2021 0934   AST 36 07/13/2021 0934   ALT 48 (H) 07/13/2021 0934   BILITOT 0.4 07/13/2021 0934       RADIOGRAPHIC STUDIES: MR Brain W Wo Contrast  Result Date: 07/09/2021 CLINICAL DATA:  Brain/CNS neoplasm, surveillance 3T SRS Protocol. Follow-up of  treated metastatic lung cancer EXAM: MRI HEAD WITHOUT AND WITH CONTRAST TECHNIQUE: Multiplanar, multiecho pulse sequences of the brain and surrounding structures were obtained without and with intravenous contrast. CONTRAST:  59m GADAVIST GADOBUTROL 1 MMOL/ML IV SOLN COMPARISON:  04/09/2021 FINDINGS: Brain: Evolution of postoperative changes since the prior study. A small remaining heterogeneous resection cavity is present in the left frontal lobe involving the precentral gyrus just deep to the craniotomy. There is surrounding enhancement. Decreased adjacent T2 FLAIR hyperintensity. Linear encephalomalacia deep to the resection cavity reflecting sequelae infarct. No acute infarction. No new mass or abnormal enhancement. No hydrocephalus. Vascular: Major vessel flow voids at the skull base are preserved. Skull and upper cervical spine: Normal marrow signal is preserved. Left parietal craniotomy. Sinuses/Orbits: Sphenoid sinus lobular mucosal thickening. Bilateral lens replacements. Other: Sella is unremarkable.  Mastoid air cells are clear. IMPRESSION: Enhancement about the left frontal resection cavity is likely postoperative and treatment related. Decreased adjacent T2 FLAIR hyperintensity probably primarily represents gliosis or less likely edema. No evidence of new metastatic disease. Electronically Signed   By: PMacy MisM.D.   On: 07/09/2021 08:32    ASSESSMENT AND PLAN: This is a very pleasant 75years old white female diagnosed with a stage IV (T1c, N1, M1 C) non-small cell lung cancer, adenocarcinoma with positive EGFR mutation (([id number] presented with locally advanced disease in the left lung including left upper lobe lung nodule in addition to left hilar adenopathy and solitary left frontal brain metastasis s/p resection followed by SRS diagnosed in July 2022. The patient is currently treatment with Tagrisso 80 mg p.o. daily and has been tolerating the treatment well except for significant diarrhea  the last 7-10 days not responding to treatment with Imodium and Lomotil.  Her treatment with TNewman Niphas been on hold since July 09, 2021 and the patient has significant improvement of her diarrhea. I recommended for the patient to resume her treatment in few days but I will reduce the dose of Tagrisso to 40 mg p.o. daily starting July 16, 2021. For the dehydration and hypokalemia, I will arrange for the patient to receive IV hydration with potassium supplement today.  We will also call her pharmacy with potassium chloride 40 mEq p.o. daily for 7 days. For the renal insufficiency, this is secondary to dehydration and diarrhea, will arrange for the patient to receive the IV hydration today and she was encouraged to increase her oral intake. The patient will come back for follow-up visit in 2 weeks for evaluation with repeat CT scan of the chest, abdomen pelvis for restaging of her disease. She was advised to call immediately if she has any other concerning symptoms in the interval.  The patient voices understanding of current disease status and treatment options and is in agreement with the current care plan.  All questions were answered. The patient knows to call the clinic with  any problems, questions or concerns. We can certainly see the patient much sooner if necessary.  The total time spent in the appointment was 30 minutes.  Disclaimer: This note was dictated with voice recognition software. Similar sounding words can inadvertently be transcribed and may not be corrected upon review.

## 2021-07-13 NOTE — Progress Notes (Addendum)
Spoke w/ patient's daughter Marie Hensley, who is cleared to speak w/ me and identified w/ 2 identifiers. Marie Hensley states that patient reports insomnia, diarrhea, nausea, vomiting, and fatigue. No other symptoms reported at this time.   Meaningful use complete.  Marie Hensley notified of patient's, Marie Hensley's 07/14/21-10:30am telephone appointment and verbalized understanding.   Requests to be contacted for this call at [phone].

## 2021-07-13 NOTE — Telephone Encounter (Signed)
CRITICAL VALUE STICKER  CRITICAL VALUE: K = 2.2  RECEIVER (on-site recipient of call): Yetta Glassman, [place] NOTIFIED: 07/13/21 at 10:  MESSENGER (representative from lab): Pam  MD NOTIFIED: Julien Nordmann  TIME OF NOTIFICATION: 07/13/21 at 10:14am  RESPONSE: Notification given directly to Dr. Julien Nordmann for follow-up with pt who is present in the office for her visit with him.

## 2021-07-13 NOTE — Progress Notes (Signed)
[facility] [place], [place] [address] [phone]   Interval Evaluation  Date of Service: 07/13/21 Patient Name: Marie Hensley Patient MRN: [id number] Patient DOB: 05/06/1946 Provider: Ventura Sellers, MD  Identifying Statement:  Marie Hensley is a 75 y.o. female with Brain metastasis ([place])  Focal seizure ([place])   Primary Cancer:  Oncology History  Primary adenocarcinoma of left upper lobe of lung ([place])  02/23/2021 Initial Diagnosis   Primary adenocarcinoma of left upper lobe of lung ([place])   05/06/2021 -  Chemotherapy    Patient is on Treatment Plan: LUNG NSCLC OSIMERTINIB Q28D       05/10/2021 - 05/10/2021 Chemotherapy            CNS Oncologic History: 03/04/21: Presents with seizure, MRI demonstrates likely L frontal metastasis 04/06/21: L frontal SRS Tammi Klippel)  Interval History: Marie Hensley presents for follow up today following recent MRI brain.  She describes improvement with seizures, no further events since increasing Keppra.  Unfortunately she has had days of very severe diarrhea, which only resolved after stopping the Tagrisso this past weekend.  Today she will require IV fluids for hypokalemia and dehydration.  Since the start of her fluid loss, she complains of increased difficulty with speech and language expression.  Her right side strength remains intact. Currently on just 0.5mg  daily decadron after weeks long gradual taper.   H+P (03/09/21) Patient presented to medical attention with sudden onset loss of speech output.  This was noticed by family on 03/04/21, she had been in normal state of health at home immediately prior.  She returned to baseline after several hours and ED visit, CNS imaging demonstrated an enhancing mass in the left frontal lobe.  She was started on Keppra 500mg  twice per day, no recurrence of seizure activity since that time.  She does acknowledge "some difficulty" with  getting words out at times.  Has known lung mass, underwent bronchoscopy x2 without formal diagnostic result.  Is considering IR biopsy next month given very high likelihood of primary lung malignancy.     Medications: Current Outpatient Medications on File Prior to Visit  Medication Sig Dispense Refill   acetaminophen (TYLENOL) 500 MG tablet Take 1,000 mg by mouth every 6 (six) hours as needed for mild pain (or arthritis).     atorvastatin (LIPITOR) 40 MG tablet Take 1 tablet (40 mg total) by mouth at bedtime. 90 tablet 3   cetirizine (ZYRTEC) 10 MG tablet Take 10 mg by mouth daily as needed for allergies or rhinitis.     citalopram (CELEXA) 20 MG tablet Take 1 tablet (20 mg total) by mouth at bedtime. 90 tablet 1   dexamethasone (DECADRON) 1 MG tablet Take 1 tablet (1 mg total) by mouth daily. 30 tablet 3   diphenoxylate-atropine (LOMOTIL) 2.5-0.025 MG tablet Take 1 tablet by mouth 4 (four) times daily as needed for diarrhea or loose stools. 30 tablet 0   fluticasone (FLONASE) 50 MCG/ACT nasal spray Place 1 spray into both nostrils daily as needed for allergies.     HYDROcodone-acetaminophen (NORCO/VICODIN) 5-325 MG tablet Take 1 tablet by mouth every 4 (four) hours as needed for moderate pain. 30 tablet 0   levETIRAcetam (KEPPRA) 500 MG tablet Take 2 tablets (1,000 mg total) by mouth 2 (two) times daily. 120 tablet 3   lisinopril-hydrochlorothiazide (ZESTORETIC) 10-12.5 MG tablet Take 1 tablet by mouth daily. 90 tablet 3   loperamide (IMODIUM A-D) 2  MG tablet Take 2 mg by mouth 2 (two) times daily as needed for diarrhea or loose stools.     prochlorperazine (COMPAZINE) 10 MG tablet Take 1 tablet (10 mg total) by mouth every 6 (six) hours as needed for nausea or vomiting. 30 tablet 0   propranolol ER (INDERAL LA) 60 MG 24 hr capsule Take 1 capsule (60 mg total) by mouth at bedtime. 90 capsule 3   No current facility-administered medications on file prior to visit.    Allergies: No Known  Allergies Past Medical History:  Past Medical History:  Diagnosis Date   Arthritis    Brain tumor ([place])    Depression    Dyspnea    WITH EXERTION-SEEING DR [name]   HTN (hypertension)    Hyperlipemia    Seizures ([facility])    Past Surgical History:  Past Surgical History:  Procedure Laterality Date   APPENDECTOMY  3149   APPLICATION OF CRANIAL NAVIGATION N/A 04/08/2021   Procedure: APPLICATION OF CRANIAL NAVIGATION;  Surgeon: Judith Part, MD;  Location: [facility];  Service: Neurosurgery;  Laterality: N/A;  RM 21   CRANIOTOMY Left 04/08/2021   Procedure: Left Craniotomy for tumor resection with brainlab;  Surgeon: Judith Part, MD;  Location: [facility];  Service: Neurosurgery;  Laterality: Left;   EYE SURGERY Bilateral    CATARACTS   OOPHORECTOMY Right    VIDEO BRONCHOSCOPY WITH ENDOBRONCHIAL NAVIGATION Left 12/23/2020   Procedure: VIDEO BRONCHOSCOPY WITH ENDOBRONCHIAL NAVIGATION;  Surgeon: Tyler Pita, MD;  Location: [facility];  Service: Pulmonary;  Laterality: Left;   VIDEO BRONCHOSCOPY WITH ENDOBRONCHIAL NAVIGATION Left 02/01/2021   Procedure: ROBOTIC ASSISTED VIDEO BRONCHOSCOPY WITH ENDOBRONCHIAL NAVIGATION;  Surgeon: Tyler Pita, MD;  Location: [facility];  Service: Pulmonary;  Laterality: Left;   VIDEO BRONCHOSCOPY WITH ENDOBRONCHIAL ULTRASOUND Left 12/23/2020   Procedure: VIDEO BRONCHOSCOPY WITH ENDOBRONCHIAL ULTRASOUND;  Surgeon: Tyler Pita, MD;  Location: [facility];  Service: Pulmonary;  Laterality: Left;   VIDEO BRONCHOSCOPY WITH ENDOBRONCHIAL ULTRASOUND Left 02/01/2021   Procedure: ROBOTIC ASSITSTED VIDEO BRONCHOSCOPY WITH ENDOBRONCHIAL ULTRASOUND;  Surgeon: Tyler Pita, MD;  Location: [facility];  Service: Pulmonary;  Laterality: Left;   Social History:  Social History   Socioeconomic History   Marital status: Single    Spouse name: Not on file   Number of children: 3   Years of education: high school    Highest education level: Not on file   Occupational History   Occupation: retired  Tobacco Use   Smoking status: Former    Packs/day: 0.50    Years: 20.00    Pack years: 10.00    Types: Cigarettes    Quit date: 10/27/1989    Years since quitting: 31.7   Smokeless tobacco: Never   Tobacco comments:    Smoked off and on in 20 year period  Vaping Use   Vaping Use: Never used  Substance and Sexual Activity   Alcohol use: Yes    Comment: 3-4 times a week, 2oz liquor    Drug use: Never   Sexual activity: Not Currently    Birth control/protection: Post-menopausal  Other Topics Concern   Not on file  Social History Narrative   She has discussed advanced directives with her.  She would want CPR and intubation if chance of recovery.   11/19/20   From: [place], moved for work 1998   Living: alone   Work: retired Energy manager of [employer]       Family:  3 children - Aldean Jewett - 5 grandchildren, 1 great-grandchild      Enjoys: shop, casino, cruise travel      Exercise: not as much due to the fatigue   Diet: tries to eat healthy, but eats what she wants      Safety   Seat belts: Yes    Guns: No   Safe in relationships: Yes    Social Determinants of Radio broadcast assistant Strain: Not on file  Food Insecurity: Not on file  Transportation Needs: Not on file  Physical Activity: Not on file  Stress: Not on file  Social Connections: Not on file  Intimate Partner Violence: Not on file   Family History:  Family History  Problem Relation Age of Onset   Addison's disease Daughter    Hypertension Mother        died at 37   Alcohol abuse Father     Review of Systems: Constitutional: Doesn't report fevers, chills or abnormal weight loss Eyes: Doesn't report blurriness of vision Ears, nose, mouth, throat, and face: Doesn't report sore throat Respiratory: Doesn't report cough, dyspnea or wheezes Cardiovascular: Doesn't report palpitation, chest discomfort  Gastrointestinal:  Doesn't report nausea,  constipation, diarrhea GU: Doesn't report incontinence Skin: Doesn't report skin rashes Neurological: Per HPI Musculoskeletal: Doesn't report joint pain Behavioral/Psych: Doesn't report anxiety  Physical Exam: Wt Readings from Last 3 Encounters:  07/13/21 151 lb (68.5 kg)  07/13/21 151 lb (68.5 kg)  06/22/21 158 lb 14.4 oz (72.1 kg)   Temp Readings from Last 3 Encounters:  07/13/21 97.7 F (36.5 C) (Oral)  07/13/21 (!) 97.1 F (36.2 C) (Tympanic)  07/10/21 97.6 F (36.4 C) (Tympanic)   BP Readings from Last 3 Encounters:  07/13/21 113/69  07/13/21 101/73  07/10/21 104/67   Pulse Readings from Last 3 Encounters:  07/13/21 68  07/13/21 79  07/10/21 91    KPS: 80. General: Alert, cooperative, pleasant, in no acute distress Head: Normal EENT: No conjunctival injection or scleral icterus.  Lungs: Resp effort normal Cardiac: Regular rate Abdomen: Non-distended abdomen Skin: No rashes cyanosis or petechiae. Extremities: No clubbing or edema  Neurologic Exam: Mental Status: Awake, alert, attentive to examiner. Oriented to self and environment. Mild expressive aphasia.  Cranial Nerves: Visual acuity is grossly normal. Visual fields are full. Extra-ocular movements intact. No ptosis. Face is symmetric Motor: Tone and bulk are normal. Power is full in both arms and legs. Reflexes are symmetric, no pathologic reflexes present.  Sensory: Intact to light touch Gait: Normal.   Labs: I have reviewed the data as listed    Component Value Date/Time   NA 137 07/13/2021 0934   K 2.2 (LL) 07/13/2021 0934   CL 110 07/13/2021 0934   CO2 20 (L) 07/13/2021 0934   GLUCOSE 120 (H) 07/13/2021 0934   BUN 26 (H) 07/13/2021 0934   CREATININE 1.93 (H) 07/13/2021 0934   CALCIUM 9.0 07/13/2021 0934   PROT 6.5 07/13/2021 0934   ALBUMIN 3.4 (L) 07/13/2021 0934   AST 36 07/13/2021 0934   ALT 48 (H) 07/13/2021 0934   ALKPHOS 49 07/13/2021 0934   BILITOT 0.4 07/13/2021 0934   GFRNONAA 27  (L) 07/13/2021 0934   Lab Results  Component Value Date   WBC 6.7 07/13/2021   NEUTROABS 3.3 07/13/2021   HGB 11.3 (L) 07/13/2021   HCT 33.4 (L) 07/13/2021   MCV 89.8 07/13/2021   PLT 225 07/13/2021    Imaging:  [facility] Interpretation: I  have personally reviewed the CNS images as listed.  My interpretation, in the context of the patient's clinical presentation, is likely treatment effect  MR Brain W Wo Contrast  Result Date: 07/09/2021 CLINICAL DATA:  Brain/CNS neoplasm, surveillance 3T SRS Protocol. Follow-up of treated metastatic lung cancer EXAM: MRI HEAD WITHOUT AND WITH CONTRAST TECHNIQUE: Multiplanar, multiecho pulse sequences of the brain and surrounding structures were obtained without and with intravenous contrast. CONTRAST:  56mL GADAVIST GADOBUTROL 1 MMOL/ML IV SOLN COMPARISON:  04/09/2021 FINDINGS: Brain: Evolution of postoperative changes since the prior study. A small remaining heterogeneous resection cavity is present in the left frontal lobe involving the precentral gyrus just deep to the craniotomy. There is surrounding enhancement. Decreased adjacent T2 FLAIR hyperintensity. Linear encephalomalacia deep to the resection cavity reflecting sequelae infarct. No acute infarction. No new mass or abnormal enhancement. No hydrocephalus. Vascular: Major vessel flow voids at the skull base are preserved. Skull and upper cervical spine: Normal marrow signal is preserved. Left parietal craniotomy. Sinuses/Orbits: Sphenoid sinus lobular mucosal thickening. Bilateral lens replacements. Other: Sella is unremarkable.  Mastoid air cells are clear. IMPRESSION: Enhancement about the left frontal resection cavity is likely postoperative and treatment related. Decreased adjacent T2 FLAIR hyperintensity probably primarily represents gliosis or less likely edema. No evidence of new metastatic disease. Electronically Signed   By: Macy Mis M.D.   On: 07/09/2021 08:32     Assessment/Plan Brain metastasis ([place])  Focal seizure ([place])  Marie Hensley presents with clinical syndrome of expressive dysphasia, likely exacerbated by hypokalemia today.  Her brain MRI is improved with regards to T2 signal burden.  Enhancement is consistent with post-SRS changes.  She will visit symptom management clinic today for electrolyte repletion.    We recommended continuing Keppra 1000mg  BID, and discontinuing decadron.  Will discuss with Dr. Julien Nordmann utility of resuming Tagrisso at a lower dose level given side effects.  We ask that Marie Hensley return to clinic in 3 months following next brain MRI, or sooner as needed.  All questions were answered. The patient knows to call the clinic with any problems, questions or concerns. No barriers to learning were detected.  The total time spent in the encounter was 30 minutes and more than 50% was on counseling and review of test results   Ventura Sellers, MD Medical Director of Neuro-Oncology [facility] at [place] 07/13/21 4:51 PM

## 2021-07-14 ENCOUNTER — Ambulatory Visit
Admission: RE | Admit: 2021-07-14 | Discharge: 2021-07-14 | Disposition: A | Discharge status: Home / Self Care | Payer: Medicare HMO | Source: Ambulatory Visit | Attending: Urology | Admitting: Urology

## 2021-07-14 ENCOUNTER — Other Ambulatory Visit: Payer: Self-pay | Admitting: Pharmacist

## 2021-07-14 DIAGNOSIS — C7931 Secondary malignant neoplasm of brain: Secondary | ICD-10-CM

## 2021-07-14 DIAGNOSIS — C3412 Malignant neoplasm of upper lobe, left bronchus or lung: Secondary | ICD-10-CM

## 2021-07-14 MED ORDER — OSIMERTINIB MESYLATE 40 MG PO TABS: 40.0000 mg | ORAL_TABLET | Freq: Every day | ORAL | 2 refills | Status: DC

## 2021-07-14 NOTE — Progress Notes (Signed)
Radiation Oncology         [phone] ________________________________  Name: Marie Hensley MRN: [id number]  Date: 07/14/2021  DOB: 22-Jun-1946  Post Treatment Note  CC: Marie Noe, MD  Marie Noe, MD  Diagnosis:   75 y.o. female with a 1.5 cm solitary brain metastasis in the left frontal lobe, secondary to stage IVA, NSCLC, adenocarcinoma with EGFR mutation.  Interval Since Last Radiation:  3 months and 2 weeks  04/06/2021//Pre-Op SRS: PTV1: The solitary brain metastasis in the left frontal lobe was treated to 20 Gray in a single fraction. Site Technique Total Dose (Gy) Dose per Fx (Gy) Completed Fx Beam Energies  Brain: Brain_SRS PTV_1 Lt Frontal 80m IMRT 20/20 20 1/1 6XFFF   Narrative: I attempted to call the patient to conduct her routine scheduled follow up visit via telephone to spare the patient unnecessary potential exposure in the healthcare setting during the current COVID-19 pandemic.  The patient was notified in advance and gave permission to proceed with this visit format but unfortunately, was unavailable at the time of my call so I had to leave a detailed message on her voicemail.  She tolerated radiation therapy relatively well without any acute ill side effects. This was followed by craniotomy on 04/08/21 under the care of Dr. OZada Findersand she has also recovered well from this procedure.                              On review of systems, obtained from her daughter, Marie Hensley the patient states that she has no complaints associated with the radiation but has had some insomnia, diarrhea, nausea, vomiting, and fatigue.  Unfortunately, she had a breakthrough seizure on 05/09/2021 despite taking Keppra 500 mg p.o. twice daily.  She was evaluated with Dr. VMickeal Skinnerwho increased her Keppra to 1000 mg p.o. twice daily and added Decadron 4 mg p.o. twice daily for 3 days and then daily thereafter after for several weeks. Her speech improved significantly with the Decadron.   She has just recently weaned off the Decadron completely, in hopes that this will help improve her insomnia.  She has not had any further seizure activity since increasing the dose of Keppra.  She has continued making good progress with speech therapy and her strength on the right side remains stable.  She was started on targeted therapy with Tagrisso on 05/27/2021 but was unable to tolerate the full dose at 80 mg due to severe diarrhea and dehydration.  She has been off this medication since 07/09/2021 with significant improvement in the diarrhea so the current plan is to resume the Tagrisso at a reduced dose of 40 mg daily beginning 07/16/2021.  She had a recent posttreatment MRI brain on 07/08/2021 and this appears stable with postoperative changes only but no new or progressive disease.  She is scheduled for a repeat CT chest for disease restaging on 07/19/2021 and a follow-up visit with Dr. MEarlie Serverthereafter, on 07/21/2021, to review results and recommendations.  ALLERGIES:  has No Known Allergies.  Meds: Current Outpatient Medications  Medication Sig Dispense Refill   acetaminophen (TYLENOL) 500 MG tablet Take 1,000 mg by mouth every 6 (six) hours as needed for mild pain (or arthritis).     atorvastatin (LIPITOR) 40 MG tablet Take 1 tablet (40 mg total) by mouth at bedtime. 90 tablet 3   cetirizine (ZYRTEC) 10 MG tablet Take 10 mg by mouth daily as needed  for allergies or rhinitis.     citalopram (CELEXA) 20 MG tablet Take 1 tablet (20 mg total) by mouth at bedtime. 90 tablet 1   dexamethasone (DECADRON) 1 MG tablet Take 1 tablet (1 mg total) by mouth daily. 30 tablet 3   diphenoxylate-atropine (LOMOTIL) 2.5-0.025 MG tablet Take 1 tablet by mouth 4 (four) times daily as needed for diarrhea or loose stools. 30 tablet 0   fluticasone (FLONASE) 50 MCG/ACT nasal spray Place 1 spray into both nostrils daily as needed for allergies.     HYDROcodone-acetaminophen (NORCO/VICODIN) 5-325 MG tablet Take 1  tablet by mouth every 4 (four) hours as needed for moderate pain. 30 tablet 0   levETIRAcetam (KEPPRA) 500 MG tablet Take 2 tablets (1,000 mg total) by mouth 2 (two) times daily. 120 tablet 3   lisinopril-hydrochlorothiazide (ZESTORETIC) 10-12.5 MG tablet Take 1 tablet by mouth daily. 90 tablet 3   loperamide (IMODIUM A-D) 2 MG tablet Take 2 mg by mouth 2 (two) times daily as needed for diarrhea or loose stools.     osimertinib mesylate (TAGRISSO) 40 MG tablet Take 1 tablet (40 mg total) by mouth daily. 30 tablet 2   prochlorperazine (COMPAZINE) 10 MG tablet Take 1 tablet (10 mg total) by mouth every 6 (six) hours as needed for nausea or vomiting. 30 tablet 0   propranolol ER (INDERAL LA) 60 MG 24 hr capsule Take 1 capsule (60 mg total) by mouth at bedtime. 90 capsule 3   No current facility-administered medications for this encounter.    Physical Findings:  vitals were not taken for this visit.  Pain Assessment Pain Score: 0-No pain/10 Unable to assess due to telephone follow-up visit format.  Lab Findings: Lab Results  Component Value Date   WBC 6.7 07/13/2021   HGB 11.3 (L) 07/13/2021   HCT 33.4 (L) 07/13/2021   MCV 89.8 07/13/2021   PLT 225 07/13/2021     Radiographic Findings: MR Brain W Wo Contrast  Result Date: 07/09/2021 CLINICAL DATA:  Brain/CNS neoplasm, surveillance 3T SRS Protocol. Follow-up of treated metastatic lung cancer EXAM: MRI HEAD WITHOUT AND WITH CONTRAST TECHNIQUE: Multiplanar, multiecho pulse sequences of the brain and surrounding structures were obtained without and with intravenous contrast. CONTRAST:  38m GADAVIST GADOBUTROL 1 MMOL/ML IV SOLN COMPARISON:  04/09/2021 FINDINGS: Brain: Evolution of postoperative changes since the prior study. A small remaining heterogeneous resection cavity is present in the left frontal lobe involving the precentral gyrus just deep to the craniotomy. There is surrounding enhancement. Decreased adjacent T2 FLAIR hyperintensity.  Linear encephalomalacia deep to the resection cavity reflecting sequelae infarct. No acute infarction. No new mass or abnormal enhancement. No hydrocephalus. Vascular: Major vessel flow voids at the skull base are preserved. Skull and upper cervical spine: Normal marrow signal is preserved. Left parietal craniotomy. Sinuses/Orbits: Sphenoid sinus lobular mucosal thickening. Bilateral lens replacements. Other: Sella is unremarkable.  Mastoid air cells are clear. IMPRESSION: Enhancement about the left frontal resection cavity is likely postoperative and treatment related. Decreased adjacent T2 FLAIR hyperintensity probably primarily represents gliosis or less likely edema. No evidence of new metastatic disease. Electronically Signed   By: PMacy MisM.D.   On: 07/09/2021 08:32    Impression/Plan: 1. 75y.o. female with a 1.5 cm solitary brain metastasis in the left frontal lobe, secondary to stage IVA, NSCLC, adenocarcinoma with EGFR mutation. She appears to have recovered well from the effects of her recent pre-op SRS treatment.  I left a detailed message on her voicemail  sharing all of this information and advising that we are happy to continue to participate in her care if clinically indicated, however, at this point, we will plan to see her back on an as-needed basis.  We will continue to be involved in the review of her posttreatment MRI brain scans and will continue to follow her progress via discussion during multidisciplinary brain conferences but her follow-up visits going forward will be with Dr. Mickeal Skinner who will also be coordinating her MRI brain scans.  She will also continue in routine follow-up under the care and direction of Dr. Earlie Server for continued management of her systemic disease.  I advised that she is welcome to call at anytime with any questions or concerns related to her previous radiotherapy.    Nicholos Johns, PA-C

## 2021-07-14 NOTE — Progress Notes (Signed)
Oral Oncology Pharmacist Encounter  Prescription for Tagrisso dose reduction sent to [facility] in error. Patient enrolled in manufacturer assistance and receives medication through [employer] patient assistance program. Prescription redirected to [facility] for dispensing.  Leron Croak, PharmD, BCPS Hematology/Oncology Clinical Pharmacist [facility] [phone] 07/14/2021 8:33 AM

## 2021-07-15 ENCOUNTER — Telehealth: Payer: Self-pay | Admitting: Internal Medicine

## 2021-07-15 NOTE — Telephone Encounter (Signed)
Scheduled follow-up appointment per 11/1 los. Patient's daughter is aware.

## 2021-07-19 ENCOUNTER — Ambulatory Visit (HOSPITAL_COMMUNITY): Payer: Medicare HMO

## 2021-07-21 ENCOUNTER — Ambulatory Visit: Payer: Medicare HMO | Admitting: Physician Assistant

## 2021-07-21 ENCOUNTER — Other Ambulatory Visit: Payer: Medicare HMO

## 2021-07-21 ENCOUNTER — Other Ambulatory Visit: Payer: Self-pay

## 2021-07-21 MED ORDER — POTASSIUM CHLORIDE CRYS ER 20 MEQ PO TBCR: 40.0000 meq | EXTENDED_RELEASE_TABLET | Freq: Every day | ORAL | 0 refills | Status: DC

## 2021-07-23 ENCOUNTER — Ambulatory Visit (HOSPITAL_COMMUNITY): Payer: Medicare HMO

## 2021-07-26 NOTE — Progress Notes (Signed)
[place] OFFICE PROGRESS NOTE  Marie Noe, MD [place] [id number]  DIAGNOSIS: Stage IV (T1c, N1, M1c) non-small cell lung cancer, adenocarcinoma presented with locally advanced disease in the left lung  with a left upper lobe lung nodule in addition to left hilar adenopathy and a solitary left frontal brain metastasis s/p resection of the brain tumor with SRS.  This was diagnosed in July 2022.   Biomarker Findings Tumor Mutational Burden - 43 Muts/Mb Microsatellite status - MS-Stable Genomic Findings For a complete list of the genes assayed, please refer to the Appendix. EGFR L861Q MTAP loss CDKN2A/B CDKN2A loss, CDKN2B loss CUL4A amplification - equivocal? IRS2 amplification SF3B1 K666N TERT promoter -124C>T TP53 N247I 7 Disease relevant genes with no reportable alterations: ALK, BRAF, ERBB2, KRAS, MET, RET, ROS1   PDL1 Expression 95%  PRIOR THERAPY: Resection followed by [facility] of the solitary frontal lobe brain metastasis on July 28th, 2022   CURRENT THERAPY: Targeted treatment with Tagrisso 80 mg p.o. daily. First dose 05/27/21. On hold since 07/09/21.  Reduced to 40 mg p.o. daily due to side effects on 07/16/21, however, has not been able to start it yet.     INTERVAL HISTORY: Marie Hensley 75 y.o. female returns to the clinic today for follow-up visit accompanied by her daughter.  The patient is on targeted treatment with Tagrisso.  She started this in September 2022.  She was tolerating this fairly well the beginning but developed profuse uncontrollable diarrhea and consequently some electrolyte derangements.  Her Tagrisso was on hold for a few days starting on 07/09/21 and she was supposed to resumed this on 07/16/2021 with a lower dose. There has been some issues shipping the lower dose of Tagrisso and she still has not received it yet. Her daughter spoke to the company a few days ago who stated they were going to overnight it; however,  they have not received it yet.   Otherwise, she denies any new concerning complaints today.  She denies any fevers, chills, night sweats, or significant weight loss. Denies any chest pain, cough, or hemoptysis.  She reports very mild dyspnea on exertion only with strenuous activities.  She denies any nausea, vomiting, or constipation. Her diarrhea has resolved. She follows up with radiation oncology and neuro-oncology for history of metastatic disease to the brain for which she previously underwent SRS and resection of the solitary brain metastasis. Underwent speech therapy due to her trouble with word finding.  The patient recently had a restaging CT scan performed.  She is here today for evaluation and repeat blood work.    MEDICAL HISTORY: Past Medical History:  Diagnosis Date   Arthritis    Brain tumor ([place])    Depression    Dyspnea    WITH EXERTION-SEEING DR [name]   HTN (hypertension)    Hyperlipemia    Seizures (H[facility])     ALLERGIES:  has No Known Allergies.  MEDICATIONS:  Current Outpatient Medications  Medication Sig Dispense Refill   acetaminophen (TYLENOL) 500 MG tablet Take 1,000 mg by mouth every 6 (six) hours as needed for mild pain (or arthritis).     atorvastatin (LIPITOR) 40 MG tablet Take 1 tablet (40 mg total) by mouth at bedtime. 90 tablet 3   cetirizine (ZYRTEC) 10 MG tablet Take 10 mg by mouth daily as needed for allergies or rhinitis.     citalopram (CELEXA) 20 MG tablet Take 1 tablet (20 mg total) by mouth  at bedtime. 90 tablet 1   dexamethasone (DECADRON) 1 MG tablet Take 1 tablet (1 mg total) by mouth daily. 30 tablet 3   fluticasone (FLONASE) 50 MCG/ACT nasal spray Place 1 spray into both nostrils daily as needed for allergies.     levETIRAcetam (KEPPRA) 500 MG tablet Take 2 tablets (1,000 mg total) by mouth 2 (two) times daily. 120 tablet 3   loperamide (IMODIUM A-D) 2 MG tablet Take 2 mg by mouth 2 (two) times daily as needed for diarrhea or loose stools.      osimertinib mesylate (TAGRISSO) 40 MG tablet Take 1 tablet (40 mg total) by mouth daily. 30 tablet 2   potassium chloride SA (KLOR-CON) 20 MEQ tablet Take 2 tablets (40 mEq total) by mouth daily. 7 tablet 0   diphenoxylate-atropine (LOMOTIL) 2.5-0.025 MG tablet Take 1 tablet by mouth 4 (four) times daily as needed for diarrhea or loose stools. (Patient not taking: Reported on 07/29/2021) 30 tablet 0   HYDROcodone-acetaminophen (NORCO/VICODIN) 5-325 MG tablet Take 1 tablet by mouth every 4 (four) hours as needed for moderate pain. (Patient not taking: Reported on 07/29/2021) 30 tablet 0   lisinopril-hydrochlorothiazide (ZESTORETIC) 10-12.5 MG tablet Take 1 tablet by mouth daily. (Patient not taking: Reported on 07/29/2021) 90 tablet 3   prochlorperazine (COMPAZINE) 10 MG tablet Take 1 tablet (10 mg total) by mouth every 6 (six) hours as needed for nausea or vomiting. (Patient not taking: Reported on 07/29/2021) 30 tablet 0   propranolol ER (INDERAL LA) 60 MG 24 hr capsule Take 1 capsule (60 mg total) by mouth at bedtime. (Patient not taking: Reported on 07/29/2021) 90 capsule 3   No current facility-administered medications for this visit.    SURGICAL HISTORY:  Past Surgical History:  Procedure Laterality Date   APPENDECTOMY  2542   APPLICATION OF CRANIAL NAVIGATION N/A 04/08/2021   Procedure: APPLICATION OF CRANIAL NAVIGATION;  Surgeon: Judith Part, MD;  Location: [facility];  Service: Neurosurgery;  Laterality: N/A;  RM 21   CRANIOTOMY Left 04/08/2021   Procedure: Left Craniotomy for tumor resection with brainlab;  Surgeon: Judith Part, MD;  Location: [facility];  Service: Neurosurgery;  Laterality: Left;   EYE SURGERY Bilateral    CATARACTS   OOPHORECTOMY Right    VIDEO BRONCHOSCOPY WITH ENDOBRONCHIAL NAVIGATION Left 12/23/2020   Procedure: VIDEO BRONCHOSCOPY WITH ENDOBRONCHIAL NAVIGATION;  Surgeon: Tyler Pita, MD;  Location: [facility];  Service: Pulmonary;  Laterality: Left;    VIDEO BRONCHOSCOPY WITH ENDOBRONCHIAL NAVIGATION Left 02/01/2021   Procedure: ROBOTIC ASSISTED VIDEO BRONCHOSCOPY WITH ENDOBRONCHIAL NAVIGATION;  Surgeon: Tyler Pita, MD;  Location: [facility];  Service: Pulmonary;  Laterality: Left;   VIDEO BRONCHOSCOPY WITH ENDOBRONCHIAL ULTRASOUND Left 12/23/2020   Procedure: VIDEO BRONCHOSCOPY WITH ENDOBRONCHIAL ULTRASOUND;  Surgeon: Tyler Pita, MD;  Location: [facility];  Service: Pulmonary;  Laterality: Left;   VIDEO BRONCHOSCOPY WITH ENDOBRONCHIAL ULTRASOUND Left 02/01/2021   Procedure: ROBOTIC ASSITSTED VIDEO BRONCHOSCOPY WITH ENDOBRONCHIAL ULTRASOUND;  Surgeon: Tyler Pita, MD;  Location: [facility];  Service: Pulmonary;  Laterality: Left;    REVIEW OF SYSTEMS:   Review of Systems  Constitutional: Negative for appetite change, chills, fatigue, fever and unexpected weight change.  HENT: Negative for mouth sores, nosebleeds, sore throat and trouble swallowing.   Eyes: Negative for eye problems and icterus.  Respiratory: Positive for mild dyspnea with certain activities. Negative for cough, hemoptysis, and wheezing. Cardiovascular: Negative for chest pain and leg swelling.  Gastrointestinal: Negative for abdominal pain,  constipation, diarrhea, nausea and vomiting.  Genitourinary: Negative for bladder incontinence, difficulty urinating, dysuria, frequency and hematuria.   Musculoskeletal: Negative for back pain, gait problem, neck pain and neck stiffness.  Skin: Negative for itching and rash.  Neurological: Positive for trouble with word finding. Negative for dizziness, extremity weakness, gait problem, headaches, light-headedness and seizures.  Hematological: Negative for adenopathy. Does not bruise/bleed easily.  Psychiatric/Behavioral: Negative for confusion, depression and sleep disturbance. The patient is not nervous/anxious.     PHYSICAL EXAMINATION:  Blood pressure 125/74, pulse 69, temperature 97.8 F (36.6 C), temperature source  Tympanic, resp. rate 17, weight 156 lb 1.6 oz (70.8 kg), SpO2 95 %.  ECOG PERFORMANCE STATUS: 1  Physical Exam  Constitutional: Oriented to person, place, and time and well-developed, well-nourished, and in no distress.  HENT:  Head: Normocephalic and atraumatic.  Mouth/Throat: Oropharynx is clear and moist. No oropharyngeal exudate.  Eyes: Conjunctivae are normal. Right eye exhibits no discharge. Left eye exhibits no discharge. No scleral icterus.  Neck: Normal range of motion. Neck supple.  Cardiovascular: Normal rate, regular rhythm, normal heart sounds and intact distal pulses.   Pulmonary/Chest: Effort normal and breath sounds normal. No respiratory distress. No wheezes. No rales.  Abdominal: Soft. Bowel sounds are normal. Exhibits no distension and no mass. There is no tenderness.  Musculoskeletal: Normal range of motion. Exhibits no edema.  Lymphadenopathy:    No cervical adenopathy.  Neurological: Alert and oriented to person, place, and time. Exhibits normal muscle tone. Gait normal. Coordination normal. Has some trouble with word finding.  Skin: Skin is warm and dry. No rash noted. Not diaphoretic. No erythema. No pallor.  Psychiatric: Mood, memory and judgment normal.  Vitals reviewed.  LABORATORY DATA: Lab Results  Component Value Date   WBC 9.1 07/29/2021   HGB 12.4 07/29/2021   HCT 40.2 07/29/2021   MCV 98.3 07/29/2021   PLT 220 07/29/2021      Chemistry      Component Value Date/Time   NA 140 07/29/2021 1103   K 4.2 07/29/2021 1103   CL 103 07/29/2021 1103   CO2 27 07/29/2021 1103   BUN 19 07/29/2021 1103   CREATININE 1.27 (H) 07/29/2021 1103      Component Value Date/Time   CALCIUM 9.4 07/29/2021 1103   ALKPHOS 56 07/29/2021 1103   AST 24 07/29/2021 1103   ALT 32 07/29/2021 1103   BILITOT 0.4 07/29/2021 1103       RADIOGRAPHIC STUDIES:  CT Abdomen Pelvis Wo Contrast  Result Date: 07/29/2021 CLINICAL DATA:  Primary Cancer Type: Lung Imaging  Indication: Assess response to therapy Interval therapy since last imaging? Yes Initial Cancer Diagnosis Date: 12/23/2020; Established by: Presumed Detailed Pathology: Stage IV non-small cell lung cancer, adenocarcinoma. Primary Tumor location: Left upper lobe. Surgeries: Craniotomy.  Appendectomy.  Right oophorectomy. Chemotherapy: Yes; Ongoing?  Yes; Tagrisso daily Immunotherapy? No Radiation therapy? Yes; Date Range: 04/06/2021; Target: Brain, left frontal lobe EXAM: CT CHEST, ABDOMEN AND PELVIS WITHOUT CONTRAST TECHNIQUE: Multidetector CT imaging of the chest, abdomen and pelvis was performed following the standard protocol without IV contrast. COMPARISON:  Most recent CT chest 01/13/2021.  12/22/2020 PET-CT. FINDINGS: CT CHEST FINDINGS Cardiovascular: Heart size appears within normal limits. Aortic atherosclerosis. No pericardial effusion. Mediastinum/Nodes: Normal appearance of the thyroid gland. The trachea appears patent and is midline. Normal appearance of the esophagus. No enlarged supraclavicular, axillary, mediastinal or hilar lymph nodes. Lungs/Pleura: No pleural effusion. Nodule within the left upper lobe measures 1.6 x 1.6 by  1.6 cm (volume = 2.1 cm^3), image 45/4. Formally this measured 1.8 x 1.6 1.3 cm (volume = 2 cm^3) tiny subpleural nodule within the lingula is stable measuring 3 mm, image 102/4. No new lung nodules identified. Musculoskeletal: No acute or suspicious osseous findings. CT ABDOMEN PELVIS FINDINGS Hepatobiliary: Within the periphery of the right hepatic lobe there is a low-attenuation structure measuring 1.8 x 1.0 cm, image 60/2. Not confidently identified on previous imaging Pancreas: Unremarkable. No pancreatic ductal dilatation or surrounding inflammatory changes. Spleen: Normal in size without focal abnormality. Adrenals/Urinary Tract: Normal adrenal glands. No kidney mass, nephrolithiasis, or hydronephrosis. Urinary bladder is unremarkable. Stomach/Bowel: Stomach appears  normal. No bowel wall thickening, inflammation, or distension. Distal colonic diverticula noted without signs of acute inflammation. Vascular/Lymphatic: No significant vascular findings are present. No enlarged abdominal or pelvic lymph nodes. Reproductive: Uterus and bilateral adnexa are unremarkable. Other: No abdominal wall hernia or abnormality. No abdominopelvic ascites. Musculoskeletal: No acute or significant osseous findings. IMPRESSION: 1. No significant change in the appearance of left upper lobe lung nodule. 2. There is a new, indeterminate low-attenuation structure within the periphery of the right hepatic lobe measuring 1.8 x 1.0 cm. Suspicious for new site of disease. More definitive characterization could be obtained with contrast enhanced MRI of the liver. 3. Aortic Atherosclerosis (ICD10-I70.0). Electronically Signed   By: Kerby Moors M.D.   On: 07/29/2021 10:34   CT Chest Wo Contrast  Result Date: 07/29/2021 CLINICAL DATA:  Primary Cancer Type: Lung Imaging Indication: Assess response to therapy Interval therapy since last imaging? Yes Initial Cancer Diagnosis Date: 12/23/2020; Established by: Presumed Detailed Pathology: Stage IV non-small cell lung cancer, adenocarcinoma. Primary Tumor location: Left upper lobe. Surgeries: Craniotomy.  Appendectomy.  Right oophorectomy. Chemotherapy: Yes; Ongoing?  Yes; Tagrisso daily Immunotherapy? No Radiation therapy? Yes; Date Range: 04/06/2021; Target: Brain, left frontal lobe EXAM: CT CHEST, ABDOMEN AND PELVIS WITHOUT CONTRAST TECHNIQUE: Multidetector CT imaging of the chest, abdomen and pelvis was performed following the standard protocol without IV contrast. COMPARISON:  Most recent CT chest 01/13/2021.  12/22/2020 PET-CT. FINDINGS: CT CHEST FINDINGS Cardiovascular: Heart size appears within normal limits. Aortic atherosclerosis. No pericardial effusion. Mediastinum/Nodes: Normal appearance of the thyroid gland. The trachea appears patent and is  midline. Normal appearance of the esophagus. No enlarged supraclavicular, axillary, mediastinal or hilar lymph nodes. Lungs/Pleura: No pleural effusion. Nodule within the left upper lobe measures 1.6 x 1.6 by 1.6 cm (volume = 2.1 cm^3), image 45/4. Formally this measured 1.8 x 1.6 1.3 cm (volume = 2 cm^3) tiny subpleural nodule within the lingula is stable measuring 3 mm, image 102/4. No new lung nodules identified. Musculoskeletal: No acute or suspicious osseous findings. CT ABDOMEN PELVIS FINDINGS Hepatobiliary: Within the periphery of the right hepatic lobe there is a low-attenuation structure measuring 1.8 x 1.0 cm, image 60/2. Not confidently identified on previous imaging Pancreas: Unremarkable. No pancreatic ductal dilatation or surrounding inflammatory changes. Spleen: Normal in size without focal abnormality. Adrenals/Urinary Tract: Normal adrenal glands. No kidney mass, nephrolithiasis, or hydronephrosis. Urinary bladder is unremarkable. Stomach/Bowel: Stomach appears normal. No bowel wall thickening, inflammation, or distension. Distal colonic diverticula noted without signs of acute inflammation. Vascular/Lymphatic: No significant vascular findings are present. No enlarged abdominal or pelvic lymph nodes. Reproductive: Uterus and bilateral adnexa are unremarkable. Other: No abdominal wall hernia or abnormality. No abdominopelvic ascites. Musculoskeletal: No acute or significant osseous findings. IMPRESSION: 1. No significant change in the appearance of left upper lobe lung nodule. 2. There is a new,  indeterminate low-attenuation structure within the periphery of the right hepatic lobe measuring 1.8 x 1.0 cm. Suspicious for new site of disease. More definitive characterization could be obtained with contrast enhanced MRI of the liver. 3. Aortic Atherosclerosis (ICD10-I70.0). Electronically Signed   By: Kerby Moors M.D.   On: 07/29/2021 10:34   MR Brain W Wo Contrast  Result Date:  07/09/2021 CLINICAL DATA:  Brain/CNS neoplasm, surveillance 3T SRS Protocol. Follow-up of treated metastatic lung cancer EXAM: MRI HEAD WITHOUT AND WITH CONTRAST TECHNIQUE: Multiplanar, multiecho pulse sequences of the brain and surrounding structures were obtained without and with intravenous contrast. CONTRAST:  64m GADAVIST GADOBUTROL 1 MMOL/ML IV SOLN COMPARISON:  04/09/2021 FINDINGS: Brain: Evolution of postoperative changes since the prior study. A small remaining heterogeneous resection cavity is present in the left frontal lobe involving the precentral gyrus just deep to the craniotomy. There is surrounding enhancement. Decreased adjacent T2 FLAIR hyperintensity. Linear encephalomalacia deep to the resection cavity reflecting sequelae infarct. No acute infarction. No new mass or abnormal enhancement. No hydrocephalus. Vascular: Major vessel flow voids at the skull base are preserved. Skull and upper cervical spine: Normal marrow signal is preserved. Left parietal craniotomy. Sinuses/Orbits: Sphenoid sinus lobular mucosal thickening. Bilateral lens replacements. Other: Sella is unremarkable.  Mastoid air cells are clear. IMPRESSION: Enhancement about the left frontal resection cavity is likely postoperative and treatment related. Decreased adjacent T2 FLAIR hyperintensity probably primarily represents gliosis or less likely edema. No evidence of new metastatic disease. Electronically Signed   By: PMacy MisM.D.   On: 07/09/2021 08:32     ASSESSMENT/PLAN:  This is a very pleasant 75years old white female diagnosed with stage IV (T1c, N1, M1c) non-small cell lung cancer, adenocarcinoma presented with locally advanced disease in the left lung with a left upper lobe lung nodule in addition to left hilar adenopathy and solitary left frontal brain metastasis s/p resection of the brain tumor with SRS in July 2022.  This was diagnosed in July 2022. Her molecular studies show she is positive for EGFR  mutation L861Q.   The patient is currently undergoing targeted treatment with Tagrisso 80 mg p.o. daily.  She started this on 05/27/21.  Due to uncontrollable diarrhea, the patient's treatment was on hold starting on 07/09/21. She was supposed to resume this on 07/16/2021 at reduced dose 40 mg p.o. daily but they have not received the new prescription at this time.    The patient was seen with Dr. MJulien Nordmann Labs are reviewed. She carries the diagnosis of CKD. Her creatinine is improved today compared to prior.   The patient recently had a restaging CT scan performed. Dr. MJulien Nordmannpersonally and independently reviewed the scan and discussed the results with the patient.  The lungs showed stable disease in the left upper lobe however there is a new indeterminate lesion in the right lobe of the liver measuring 1.8 x 1.0 cm which is suspicious for new site of disease.  Unclear if this disease progression is secondary to being off treatment for several weeks versus Tagrisso being ineffective.  We will arrange for a MRI of the liver to further characterize the liver lesion.  Additionally, Dr. MJulien Nordmannrecommends that the patient continue on Tagrisso with a short-term interval follow-up with restaging CT scan in 6 weeks.  Until the patient receives her new dose of Tagrisso in the mail, Dr. MJulien Nordmannrecommended that the patient take her 80 mg dose every other day and take Imodium every day to prevent diarrhea.  We will see her back for follow-up visit in 3 weeks for evaluation and repeat blood work and at that time we will arrange for her next restaging scan.  The patient was advised to call immediately if she has any concerning symptoms in the interval. The patient voices understanding of current disease status and treatment options and is in agreement with the current care plan. All questions were answered. The patient knows to call the clinic with any problems, questions or concerns. We can certainly see the  patient much sooner if necessary   Orders Placed This Encounter  Procedures   MR LIVER W WO CONTRAST    Standing Status:   Future    Standing Expiration Date:   07/29/2022    Order Specific Question:   If indicated for the ordered procedure, I authorize the administration of contrast media per Radiology protocol    Answer:   Yes    Order Specific Question:   What is the patient's sedation requirement?    Answer:   No Sedation    Order Specific Question:   Does the patient have a pacemaker or implanted devices?    Answer:   No    Order Specific Question:   Preferred imaging location?    Answer:   [facility] (table limit - 550 lbs)   CBC with Differential ([place] Only)    Standing Status:   Future    Standing Expiration Date:   07/29/2022   CMP ([place] only)    Standing Status:   Future    Standing Expiration Date:   07/29/2022      Tobe Sos Harshini Trent, PA-C 07/29/21  ADDENDUM: Hematology/Oncology Attending: I had a face-to-face encounter with the patient today.  I reviewed her record, lab, scan and recommended her care plan.  This is a very pleasant 75 years old white female diagnosed with a stage IV (T1c, N1, M1 C) non-small cell lung cancer, adenocarcinoma presented with left upper lobe lung nodule in addition to left hilar adenopathy and solitary left frontal brain metastasis s/p resection followed by [facility] in July 2022.  Molecular studies showed positive EGFR mutation in exon 21 (L861Q). The patient started treatment with Tagrisso 80 mg p.o. daily on 05/27/2021 but because of uncontrolled diarrhea her treatment was on hold on July 09, 2021 and new prescription for 40 mg tablet was sent to her pharmacy but unfortunately the patient has not received the new medication yet. She had repeat CT scan of the chest, abdomen pelvis performed recently.  I personally and independently reviewed the scan images and discussed the results with the patient and her  daughter. Her scan showed no significant change in the left upper lobe lung nodule but unfortunately there is concerning finding of new lesion in the liver. This could be disease progression on her treatment with Tagrisso but also lack of treatment for more than 3 weeks could be a factor. I recommended for the patient to have MRI of the liver for further evaluation of this lesion. I also advised her to resume her treatment with Tagrisso 80 mg every other day until she receives the new prescription of 40 mg tablets then she will switch immediately to the new dose. We will consider repeating CT scan of the chest, abdomen and pelvis in around 6 weeks from now to see if the patient develops any other disease progression. She will come back for follow-up visit in 3 weeks for evaluation and repeat blood work. She  was advised to call immediately if she has any other concerning symptoms in the interval.  The total time spent in the appointment was 30 minutes. Disclaimer: This note was dictated with voice recognition software. Similar sounding words can inadvertently be transcribed and may be missed upon review. Eilleen Kempf, MD 07/29/21

## 2021-07-28 ENCOUNTER — Ambulatory Visit (HOSPITAL_COMMUNITY)
Admission: RE | Admit: 2021-07-28 | Discharge: 2021-07-28 | Disposition: A | Discharge status: Home / Self Care | Payer: Medicare HMO | Source: Ambulatory Visit | Attending: Physician Assistant | Admitting: Physician Assistant

## 2021-07-28 ENCOUNTER — Ambulatory Visit (HOSPITAL_COMMUNITY): Payer: Medicare HMO

## 2021-07-28 ENCOUNTER — Encounter (HOSPITAL_COMMUNITY): Payer: Self-pay

## 2021-07-28 ENCOUNTER — Other Ambulatory Visit: Payer: Self-pay

## 2021-07-28 DIAGNOSIS — C3490 Malignant neoplasm of unspecified part of unspecified bronchus or lung: Secondary | ICD-10-CM | POA: Diagnosis not present

## 2021-07-28 DIAGNOSIS — C3412 Malignant neoplasm of upper lobe, left bronchus or lung: Secondary | ICD-10-CM | POA: Insufficient documentation

## 2021-07-28 DIAGNOSIS — K7689 Other specified diseases of liver: Secondary | ICD-10-CM | POA: Diagnosis not present

## 2021-07-28 DIAGNOSIS — R911 Solitary pulmonary nodule: Secondary | ICD-10-CM | POA: Diagnosis not present

## 2021-07-28 DIAGNOSIS — I700 Atherosclerosis of aorta: Secondary | ICD-10-CM | POA: Diagnosis not present

## 2021-07-28 IMAGING — CT CT CHEST W/O CM
2 of 4 series · 12 of 36 positions shown, 15 images · non-contrast
Comparison: Most recent CT chest [DATE].  [DATE] PET-CT.

CLINICAL DATA: Primary Cancer Type: Lung
TECHNIQUE: Multidetector CT imaging of the chest, abdomen and pelvis was
performed following the standard protocol without IV contrast.

[Series 2: cap w/o · axial · non-contrast · 0.80mm/px · z∈[-270,+245]mm · 9 of 125 slices shown, 12 images]
[im 11/125  mediastinal]
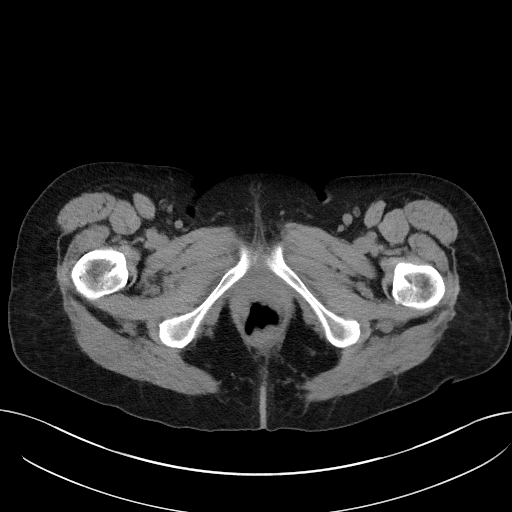
[im 11/125  lung]
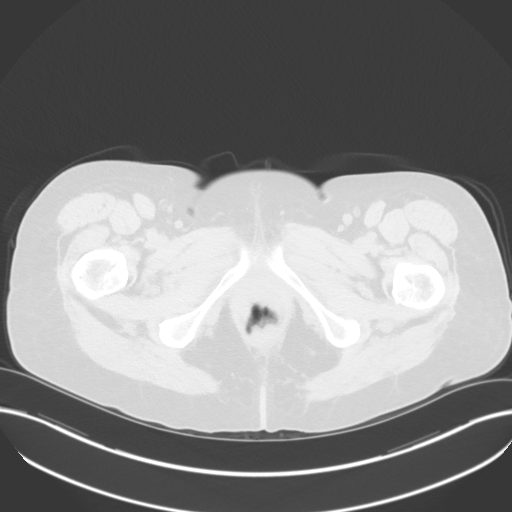
[im 21/125  lung]
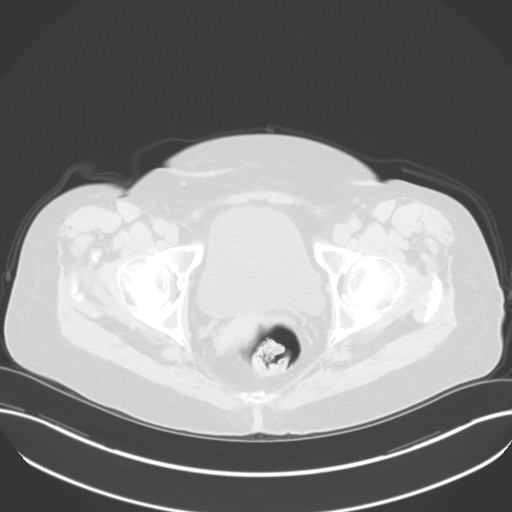
[im 42/125  lung]
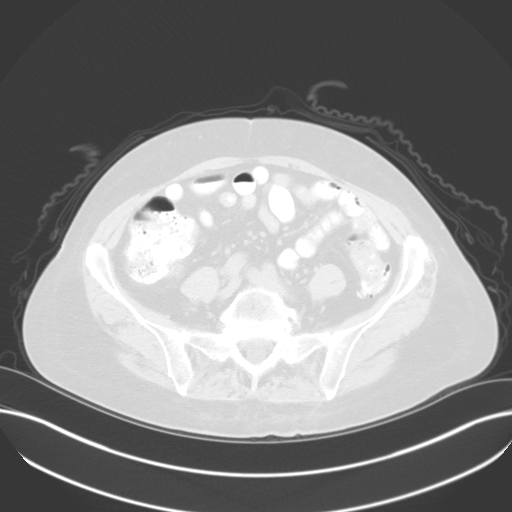
[im 52/125  lung]
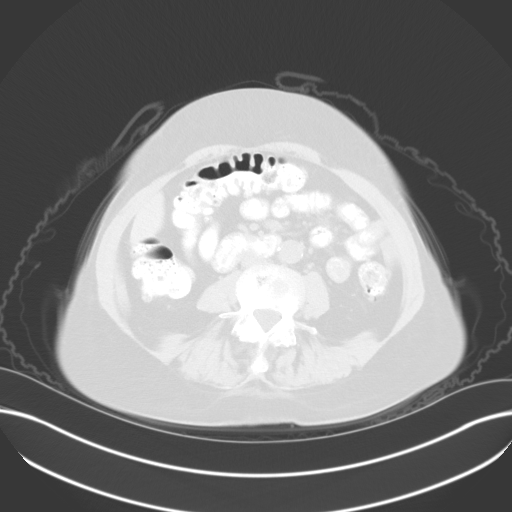
[im 63/125  mediastinal]
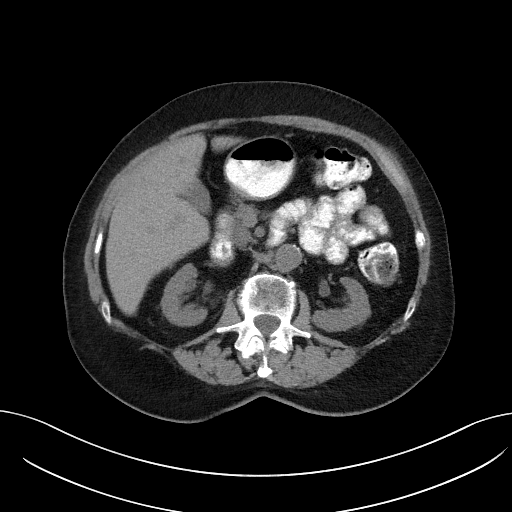
[im 63/125  lung]
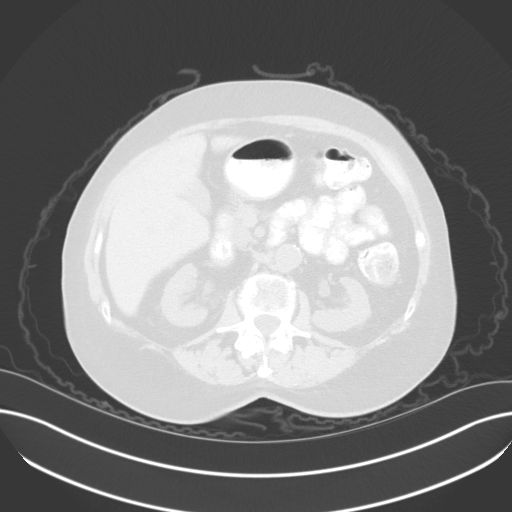
[im 73/125  lung]
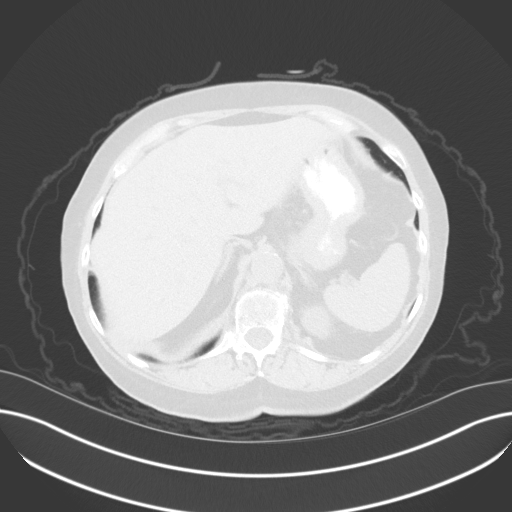
[im 83/125  lung]
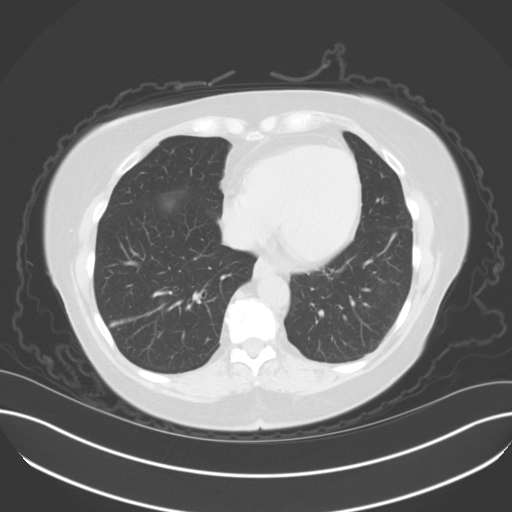
[im 104/125  lung]
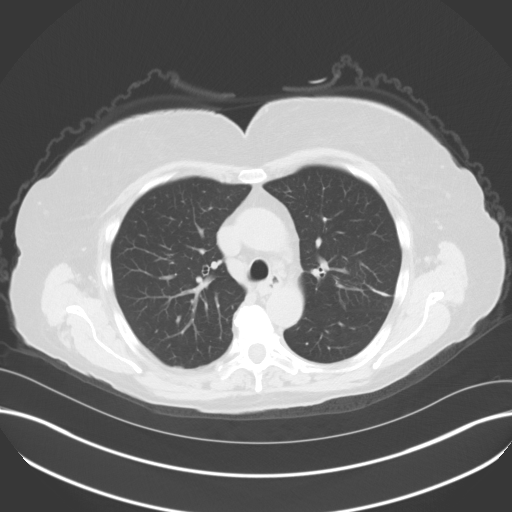
[im 114/125  mediastinal]
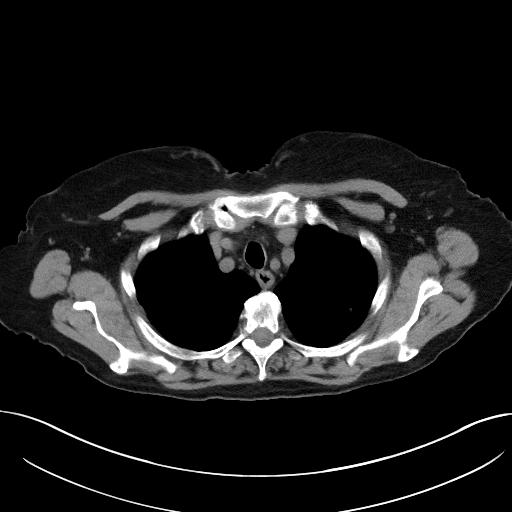
[im 114/125  lung]
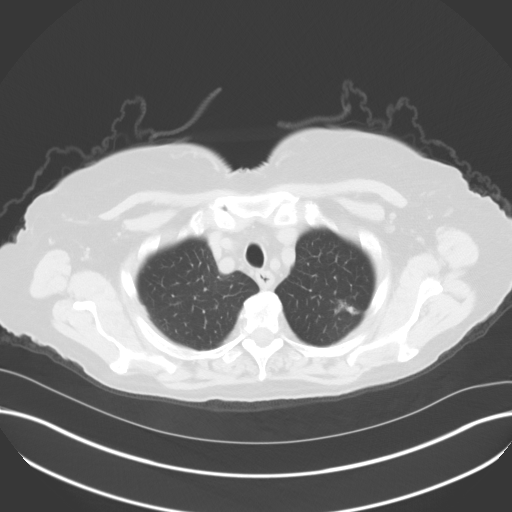

[Series 5: coronals · coronal · 0.68mm/px · 3 of 137 slices shown]
[im 28/137  lung]
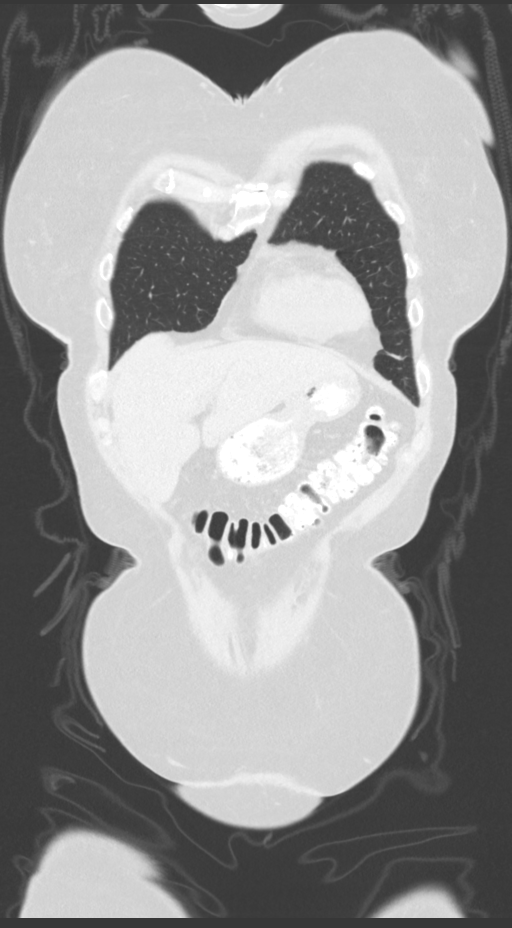
[im 55/137  lung]
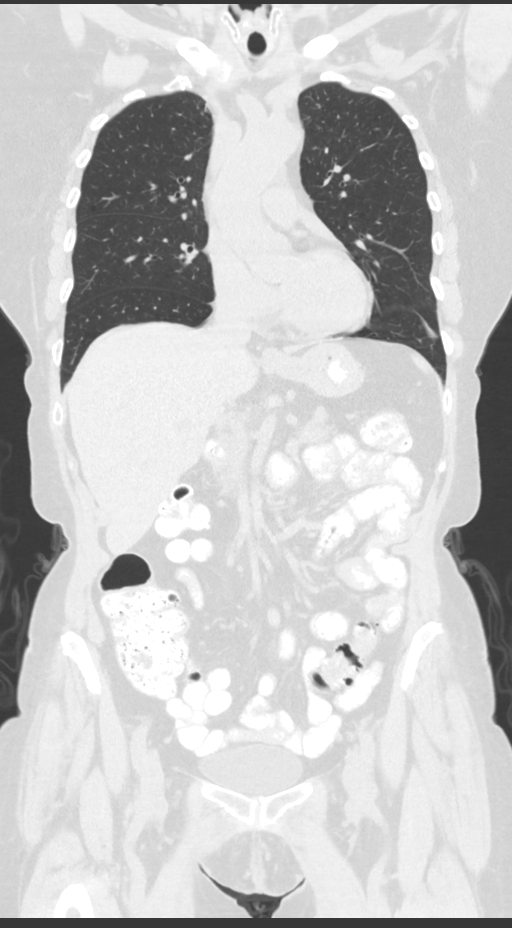
[im 82/137  lung]
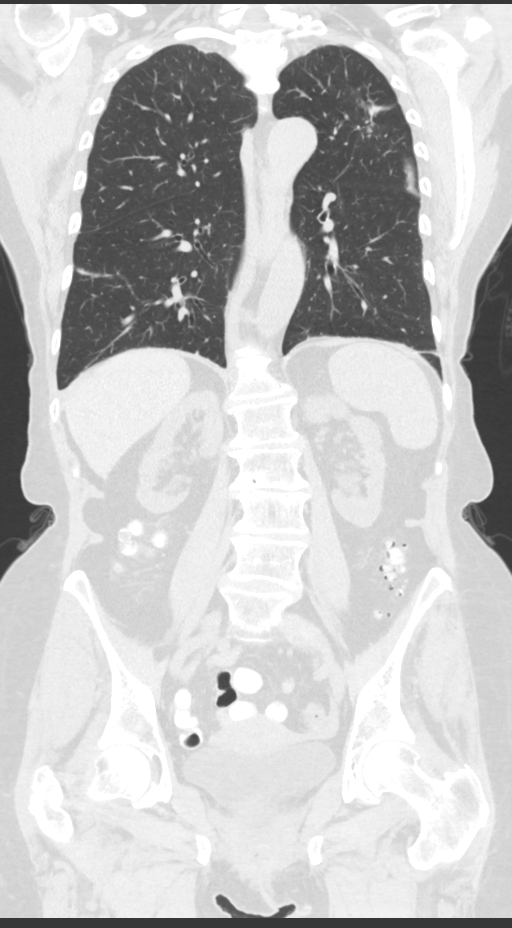

[12 of 36 positions shown; findings below may reference images not displayed]

Imaging Indication: Assess response to therapy

Interval therapy since last imaging? Yes

Initial Cancer Diagnosis

Date: [DATE]; Established by: Presumed

Detailed Pathology: Stage IV non-small cell lung cancer,
adenocarcinoma.

Primary Tumor location: Left upper lobe.

Surgeries: Craniotomy.  Appendectomy.  Right oophorectomy.

Chemotherapy: Yes; Ongoing?  Yes; Tagrisso daily

Immunotherapy? No

Radiation therapy? Yes; Date Range: [DATE]; Target: Brain, left
frontal lobe

EXAM:
CT CHEST, ABDOMEN AND PELVIS WITHOUT CONTRAST
FINDINGS: CT CHEST FINDINGS

Cardiovascular: Heart size appears within normal limits. Aortic
atherosclerosis. No pericardial effusion.

Mediastinum/Nodes: Normal appearance of the thyroid gland. The
trachea appears patent and is midline. Normal appearance of the
esophagus. No enlarged supraclavicular, axillary, mediastinal or
hilar lymph nodes.

Lungs/Pleura: No pleural effusion. Nodule within the left upper lobe
measures 1.6 x 1.6 by 1.6 cm (volume = 2.1 cm^3), image 45/4.
Formally this measured 1.8 x 1.6 1.3 cm (volume = 2 cm^3) tiny
subpleural nodule within the lingula is stable measuring 3 mm, image
102/4. No new lung nodules identified.

Musculoskeletal: No acute or suspicious osseous findings.

CT ABDOMEN PELVIS FINDINGS

Hepatobiliary: Within the periphery of the right hepatic lobe there
is a low-attenuation structure measuring 1.8 x 1.0 cm, image 60/2.
Not confidently identified on previous imaging

Pancreas: Unremarkable. No pancreatic ductal dilatation or
surrounding inflammatory changes.

Spleen: Normal in size without focal abnormality.

Adrenals/Urinary Tract: Normal adrenal glands. No kidney mass,
nephrolithiasis, or hydronephrosis. Urinary bladder is unremarkable.

Stomach/Bowel: Stomach appears normal. No bowel wall thickening,
inflammation, or distension. Distal colonic diverticula noted
without signs of acute inflammation.

Vascular/Lymphatic: No significant vascular findings are present. No
enlarged abdominal or pelvic lymph nodes.

Reproductive: Uterus and bilateral adnexa are unremarkable.

Other: No abdominal wall hernia or abnormality. No abdominopelvic
ascites.

Musculoskeletal: No acute or significant osseous findings.
IMPRESSION: 1. No significant change in the appearance of left upper lobe lung
nodule.
2. There is a new, indeterminate low-attenuation structure within
the periphery of the right hepatic lobe measuring 1.8 x 1.0 cm.
Suspicious for new site of disease. More definitive characterization
could be obtained with contrast enhanced MRI of the liver.
3. Aortic Atherosclerosis ([WW]-[WW]).

## 2021-07-29 ENCOUNTER — Inpatient Hospital Stay: Payer: Medicare HMO | Admitting: Physician Assistant

## 2021-07-29 ENCOUNTER — Inpatient Hospital Stay: Payer: Medicare HMO

## 2021-07-29 VITALS — BP 125/74 | HR 69 | Temp 97.8°F | Resp 17 | Wt 156.1 lb

## 2021-07-29 DIAGNOSIS — C771 Secondary and unspecified malignant neoplasm of intrathoracic lymph nodes: Secondary | ICD-10-CM | POA: Diagnosis not present

## 2021-07-29 DIAGNOSIS — E876 Hypokalemia: Secondary | ICD-10-CM | POA: Diagnosis not present

## 2021-07-29 DIAGNOSIS — E860 Dehydration: Secondary | ICD-10-CM | POA: Diagnosis not present

## 2021-07-29 DIAGNOSIS — R197 Diarrhea, unspecified: Secondary | ICD-10-CM | POA: Diagnosis not present

## 2021-07-29 DIAGNOSIS — C7931 Secondary malignant neoplasm of brain: Secondary | ICD-10-CM | POA: Diagnosis not present

## 2021-07-29 DIAGNOSIS — C3412 Malignant neoplasm of upper lobe, left bronchus or lung: Secondary | ICD-10-CM

## 2021-07-29 DIAGNOSIS — R69 Illness, unspecified: Secondary | ICD-10-CM | POA: Diagnosis not present

## 2021-07-29 DIAGNOSIS — K769 Liver disease, unspecified: Secondary | ICD-10-CM | POA: Diagnosis not present

## 2021-07-29 DIAGNOSIS — I10 Essential (primary) hypertension: Secondary | ICD-10-CM | POA: Diagnosis not present

## 2021-07-29 DIAGNOSIS — R5383 Other fatigue: Secondary | ICD-10-CM | POA: Diagnosis not present

## 2021-07-29 DIAGNOSIS — Z923 Personal history of irradiation: Secondary | ICD-10-CM | POA: Diagnosis not present

## 2021-07-29 LAB — CBC WITH DIFFERENTIAL (CANCER CENTER ONLY)
Abs Immature Granulocytes: 0.06 10*3/uL (ref 0.00–0.07)
Basophils Absolute: 0 10*3/uL (ref 0.0–0.1)
Basophils Relative: 0 %
Eosinophils Absolute: 0.1 10*3/uL (ref 0.0–0.5)
Eosinophils Relative: 2 %
HCT: 40.2 % (ref 36.0–46.0)
Hemoglobin: 12.4 g/dL (ref 12.0–15.0)
Immature Granulocytes: 1 %
Lymphocytes Relative: 32 %
Lymphs Abs: 2.9 10*3/uL (ref 0.7–4.0)
MCH: 30.3 pg (ref 26.0–34.0)
MCHC: 30.8 g/dL (ref 30.0–36.0)
MCV: 98.3 fL (ref 80.0–100.0)
Monocytes Absolute: 1 10*3/uL (ref 0.1–1.0)
Monocytes Relative: 10 %
Neutro Abs: 5.1 10*3/uL (ref 1.7–7.7)
Neutrophils Relative %: 55 %
Platelet Count: 220 10*3/uL (ref 150–400)
RBC: 4.09 MIL/uL (ref 3.87–5.11)
RDW: 13.6 % (ref 11.5–15.5)
WBC Count: 9.1 10*3/uL (ref 4.0–10.5)
nRBC: 0 % (ref 0.0–0.2)

## 2021-07-29 LAB — CMP (CANCER CENTER ONLY)
ALT: 32 U/L (ref 0–44)
AST: 24 U/L (ref 15–41)
Albumin: 3.7 g/dL (ref 3.5–5.0)
Alkaline Phosphatase: 56 U/L (ref 38–126)
Anion gap: 10 (ref 5–15)
BUN: 19 mg/dL (ref 8–23)
CO2: 27 mmol/L (ref 22–32)
Calcium: 9.4 mg/dL (ref 8.9–10.3)
Chloride: 103 mmol/L (ref 98–111)
Creatinine: 1.27 mg/dL — ABNORMAL HIGH (ref 0.44–1.00)
GFR, Estimated: 44 mL/min — ABNORMAL LOW (ref >60)
Glucose, Bld: 96 mg/dL (ref 70–99)
Potassium: 4.2 mmol/L (ref 3.5–5.1)
Sodium: 140 mmol/L (ref 135–145)
Total Bilirubin: 0.4 mg/dL (ref 0.3–1.2)
Total Protein: 7.1 g/dL (ref 6.5–8.1)

## 2021-08-10 ENCOUNTER — Telehealth: Payer: Self-pay | Admitting: Family Medicine

## 2021-08-10 DIAGNOSIS — F32A Depression, unspecified: Secondary | ICD-10-CM

## 2021-08-10 NOTE — Telephone Encounter (Signed)
It looks like her AWV/CPE is overdue from August 2021.  She has seen Dr. Einar Pheasant a few times in 2022, so okay to schedule her for AWV/CPE for March 2023.  Will send refills.

## 2021-08-10 NOTE — Telephone Encounter (Signed)
No future appointments made. When should patient follow up?

## 2021-08-11 ENCOUNTER — Encounter: Payer: Self-pay | Admitting: Family Medicine

## 2021-08-11 NOTE — Telephone Encounter (Signed)
Called patient and lvmtcb and sent letter

## 2021-08-11 NOTE — Telephone Encounter (Signed)
Please call and schedule her for AWV/CPE for March 2023.

## 2021-08-12 ENCOUNTER — Other Ambulatory Visit: Payer: Self-pay

## 2021-08-12 ENCOUNTER — Ambulatory Visit (HOSPITAL_COMMUNITY)
Admission: RE | Admit: 2021-08-12 | Discharge: 2021-08-12 | Disposition: A | Discharge status: Home / Self Care | Payer: Medicare HMO | Source: Ambulatory Visit | Attending: Physician Assistant | Admitting: Physician Assistant

## 2021-08-12 DIAGNOSIS — R1900 Intra-abdominal and pelvic swelling, mass and lump, unspecified site: Secondary | ICD-10-CM | POA: Diagnosis not present

## 2021-08-12 DIAGNOSIS — K769 Liver disease, unspecified: Secondary | ICD-10-CM | POA: Insufficient documentation

## 2021-08-12 DIAGNOSIS — R160 Hepatomegaly, not elsewhere classified: Secondary | ICD-10-CM | POA: Diagnosis not present

## 2021-08-12 DIAGNOSIS — C3490 Malignant neoplasm of unspecified part of unspecified bronchus or lung: Secondary | ICD-10-CM | POA: Diagnosis not present

## 2021-08-12 DIAGNOSIS — K7689 Other specified diseases of liver: Secondary | ICD-10-CM | POA: Diagnosis not present

## 2021-08-12 IMAGING — MR MR ABDOMEN WO/W CM
18 series · 48 of 48 positions shown · IV contrast (7ml GADAVIST)
Comparison: Noncontrast CT on [DATE]

CLINICAL DATA: Possible hepatic mass on recent noncontrast CT. Lung
carcinoma.

EXAM:
MRI ABDOMEN WITHOUT AND WITH CONTRAST
TECHNIQUE: Multiplanar multisequence MR imaging of the abdomen was performed
both before and after the administration of intravenous contrast.
CONTRAST:  7mL GADAVIST GADOBUTROL 1 MMOL/ML IV SOLN

[Series 2: DWI · axial · 6.0mm · 1.49mm/px · z∈[-137,+101]mm · 4 of 68 slices shown (1 of 2)]
[im 1/68]
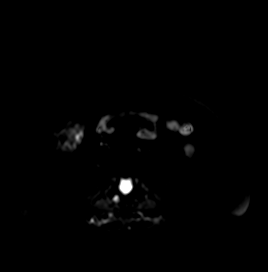
[im 23/68]
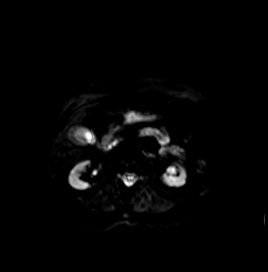
[im 45/68]
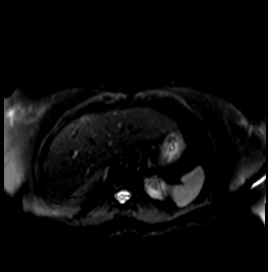
[im 68/68]
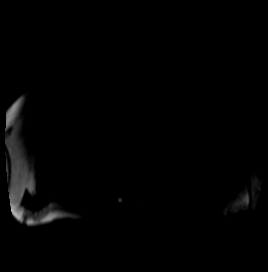

[Series 3: DWI · axial · 6.0mm · 1.49mm/px · z∈[-137,+101]mm · 2 of 34 slices shown (2 of 2)]
[im 1/34]
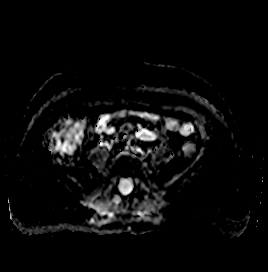
[im 34/34]
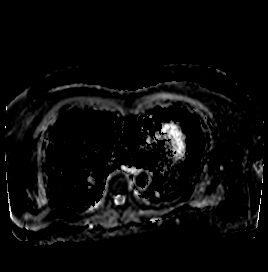

[Series 4: T2 fat-sat · axial · 6.0mm · 1.25mm/px · z∈[-123,+100]mm · 2 of 32 slices shown]
[im 1/32]
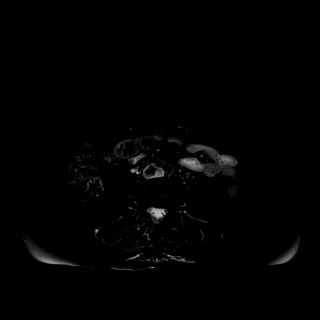
[im 32/32]
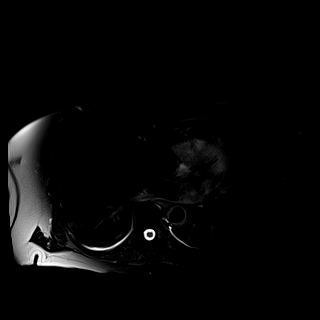

[Series 7: T2 · coronal · 6.0mm · 1.56mm/px · 2 of 35 slices shown (1 of 2)]
[im 1/35]
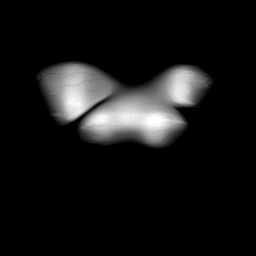
[im 35/35]
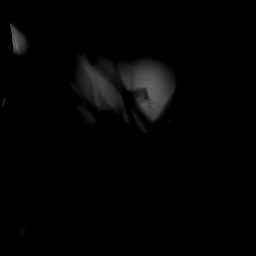

[Series 8: T1 · axial · 3.4mm · 1.25mm/px · z∈[-153,+88]mm · 3 of 72 slices shown (1 of 2)]
[im 1/72]
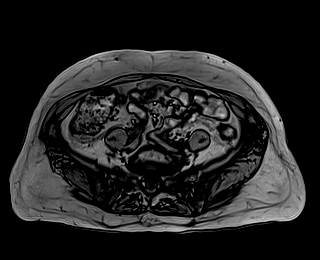
[im 36/72]
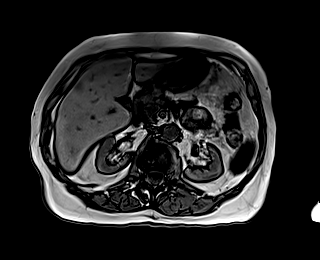
[im 72/72]
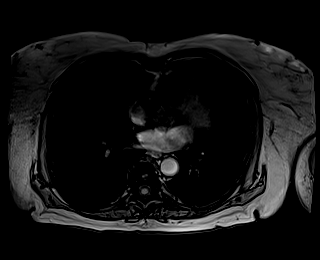

[Series 9: T1 · axial · 3.4mm · 1.25mm/px · z∈[-153,+88]mm · 3 of 72 slices shown (2 of 2)]
[im 1/72]
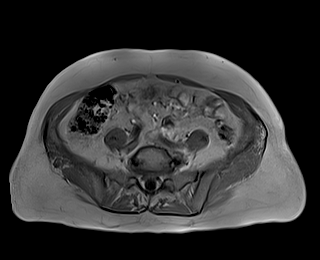
[im 36/72]
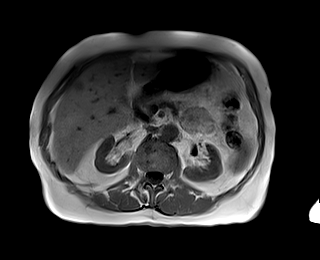
[im 72/72]
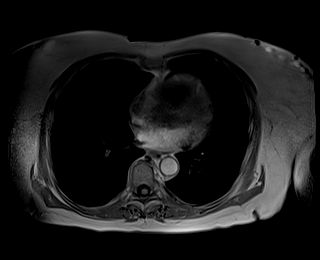

[Series 10: bSSFP · axial · 4.0mm · 0.84mm/px · z∈[-140,+96]mm · 2 of 60 slices shown]
[im 1/60]
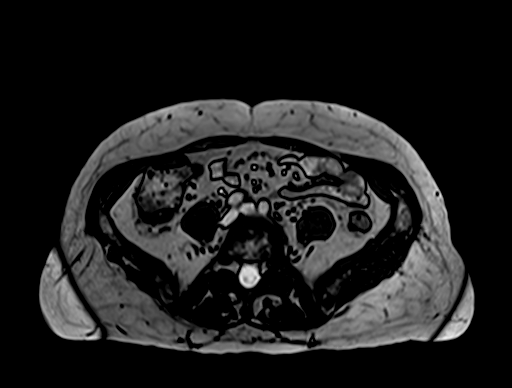
[im 60/60]
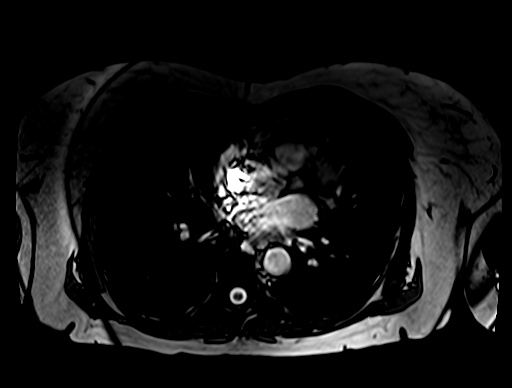

[Series 12: T1 dynamic · axial · 3.0mm · 1.25mm/px · z∈[-147,+90]mm · 3 of 80 slices shown (1 of 10)]
[im 1/80]
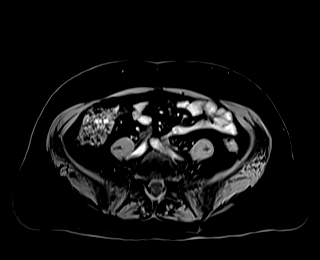
[im 40/80]
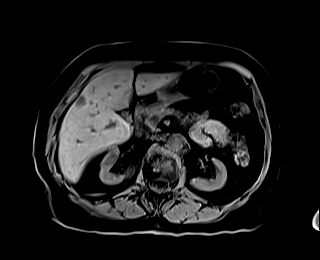
[im 80/80]
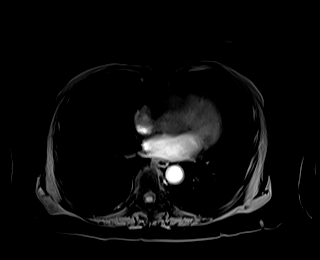

[Series 16: T1 dynamic · axial · 3.0mm · 1.25mm/px · z∈[-147,+90]mm · 3 of 80 slices shown (2 of 10)]
[im 1/80]
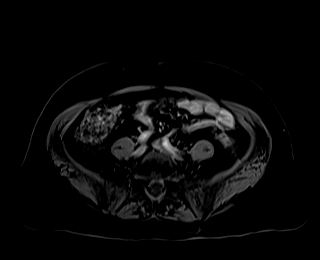
[im 40/80]
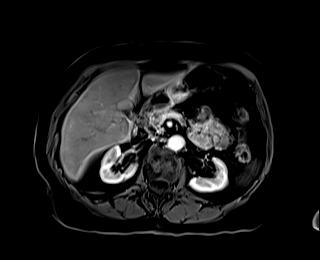
[im 80/80]
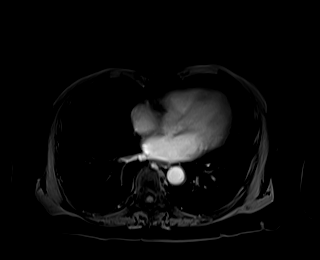

[Series 17: T1 dynamic · axial · 3.0mm · 1.25mm/px · z∈[-147,+90]mm · 3 of 80 slices shown (3 of 10)]
[im 1/80]
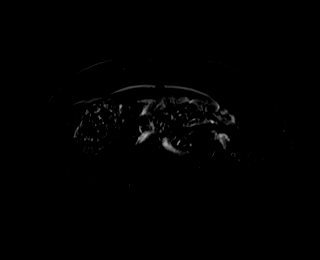
[im 40/80]
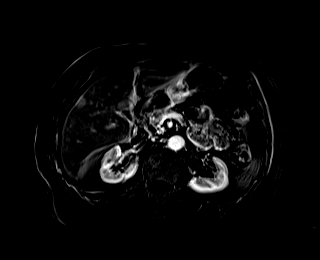
[im 80/80]
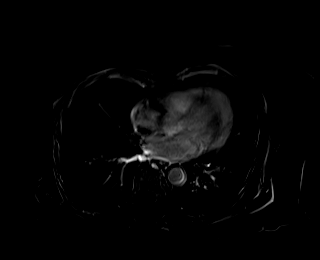

[Series 20: T1 dynamic · axial · 3.0mm · 1.25mm/px · z∈[-147,+90]mm · 3 of 80 slices shown (4 of 10)]
[im 1/80]
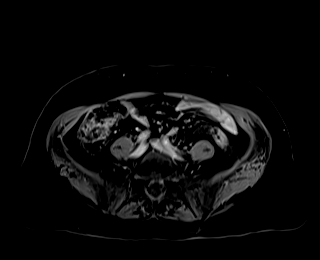
[im 40/80]
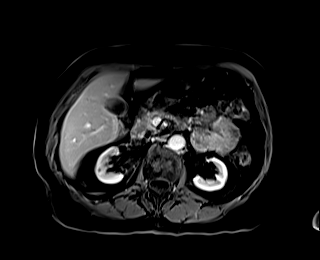
[im 80/80]
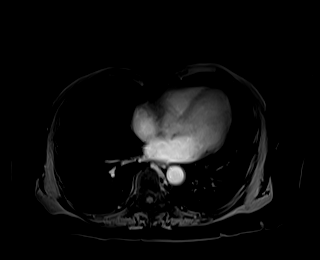

[Series 21: T1 dynamic · axial · 3.0mm · 1.25mm/px · z∈[-147,+90]mm · 3 of 80 slices shown (5 of 10)]
[im 1/80]
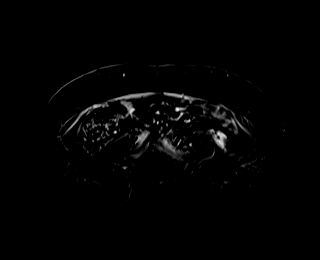
[im 40/80]
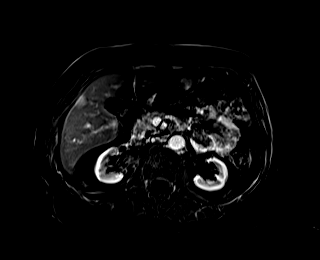
[im 80/80]
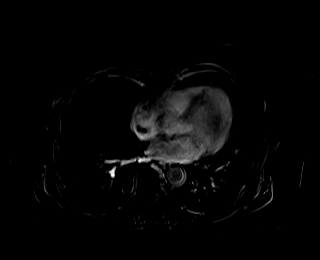

[Series 24: T1 dynamic · axial · 3.0mm · 1.25mm/px · z∈[-147,+90]mm · 3 of 80 slices shown (6 of 10)]
[im 1/80]
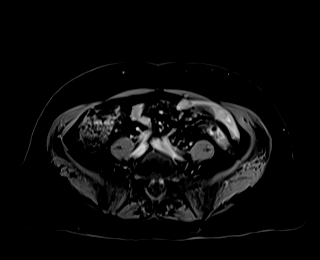
[im 40/80]
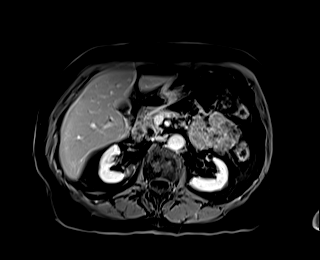
[im 80/80]
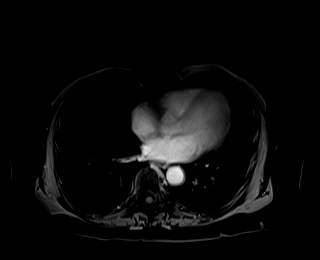

[Series 25: T1 dynamic · axial · 3.0mm · 1.25mm/px · z∈[-147,+90]mm · 3 of 80 slices shown (7 of 10)]
[im 1/80]
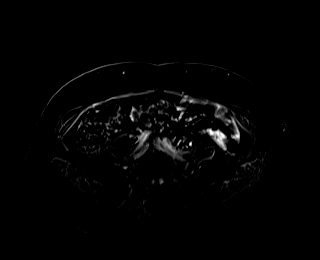
[im 40/80]
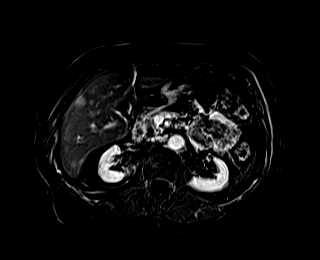
[im 80/80]
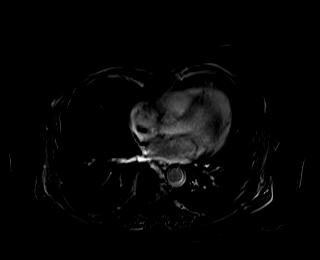

[Series 27: T1 dynamic · coronal · 5.0mm · 1.41mm/px · 2 of 52 slices shown (8 of 10)]
[im 1/52]
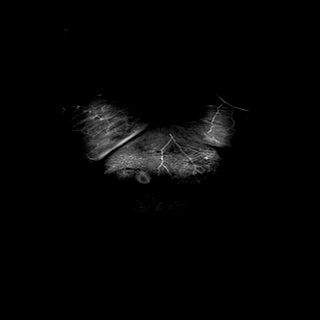
[im 52/52]
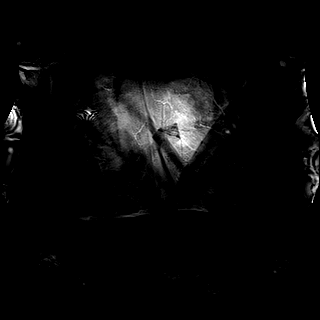

[Series 28: T2 · axial · 6.0mm · 1.56mm/px · 1 of 36 slices shown (2 of 2)]
[im 1/36]
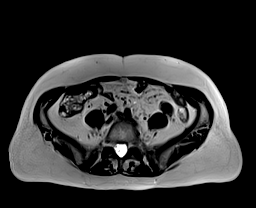

[Series 31: T1 dynamic · axial · 3.0mm · 1.25mm/px · z∈[-147,+90]mm · 3 of 80 slices shown (9 of 10)]
[im 1/80]
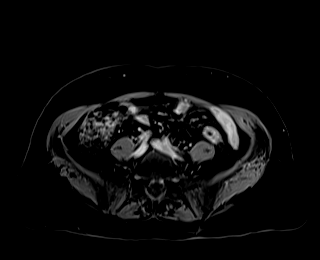
[im 40/80]
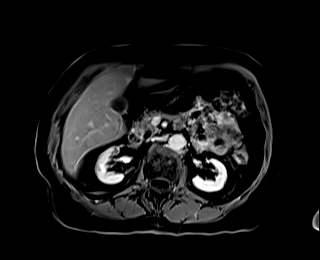
[im 80/80]
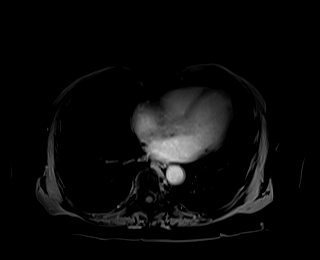

[Series 32: T1 dynamic · axial · 3.0mm · 1.25mm/px · z∈[-147,+90]mm · 3 of 80 slices shown (10 of 10)]
[im 1/80]
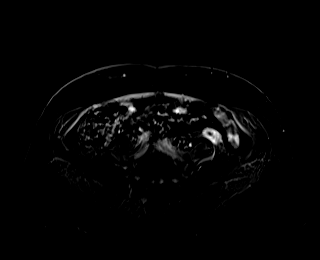
[im 40/80]
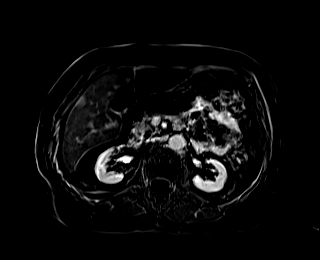
[im 80/80]
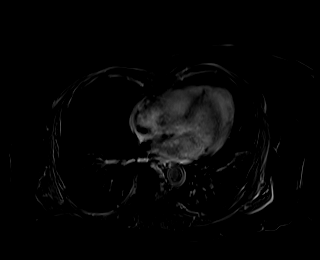

[48 of 48 positions shown; findings below may reference images not displayed]

FINDINGS: Lower chest: No acute findings.

Hepatobiliary: A hypovascular mass with thin peripheral contrast
enhancement, restricted diffusion, and moderate T2 hyperintensity is
seen in segment 5 of the right hepatic lobe. This measures 1.8 x
cm on image 38/24. A tiny sub-cm cyst or hemangioma is seen in
segment 2 of the left lobe (image [DATE]). Gallbladder is unremarkable.
No evidence of biliary ductal dilatation.

Pancreas:  No mass or inflammatory changes.

Spleen:  Within normal limits in size and appearance.

Adrenals/Urinary Tract: No masses identified. No evidence of
hydronephrosis.

Stomach/Bowel: Visualized portion unremarkable.

Vascular/Lymphatic: No pathologically enlarged lymph nodes
identified. No acute vascular findings.

Other:  None.

Musculoskeletal:  No suspicious bone lesions identified.
IMPRESSION: 1.8 cm hypovascular mass in the anterior right hepatic lobe.
Although characteristics are nonspecific, this raises suspicion for
liver metastasis.

No other evidence of metastatic disease within the abdomen.

## 2021-08-12 MED ORDER — GADOBUTROL 1 MMOL/ML IV SOLN
7.0000 mL | Freq: Once | INTRAVENOUS | Status: AC | PRN
  Administered 2021-08-12: 7 mL via INTRAVENOUS

## 2021-08-16 ENCOUNTER — Telehealth: Payer: Self-pay | Admitting: Physician Assistant

## 2021-08-16 NOTE — Telephone Encounter (Signed)
Called the patient to let her know I reviewed her MRI with Dr. Julien Nordmann. While the findings remain suspicious for the new liver lesion to be related to metastatic disease, the characteristics are not specific. Therefore, Dr. Julien Nordmann is planning on monitoring this area closely on future imaging studies. He will review it in more detail when he sees her on 08/23/21.

## 2021-08-23 ENCOUNTER — Inpatient Hospital Stay: Payer: Medicare HMO | Admitting: Internal Medicine

## 2021-08-23 ENCOUNTER — Encounter: Payer: Self-pay | Admitting: Internal Medicine

## 2021-08-23 ENCOUNTER — Other Ambulatory Visit: Payer: Self-pay

## 2021-08-23 ENCOUNTER — Inpatient Hospital Stay: Payer: Medicare HMO | Attending: Internal Medicine

## 2021-08-23 VITALS — BP 139/75 | HR 78 | Temp 97.3°F | Resp 18 | Ht 64.0 in | Wt 158.6 lb

## 2021-08-23 DIAGNOSIS — R69 Illness, unspecified: Secondary | ICD-10-CM | POA: Diagnosis not present

## 2021-08-23 DIAGNOSIS — Z79899 Other long term (current) drug therapy: Secondary | ICD-10-CM | POA: Insufficient documentation

## 2021-08-23 DIAGNOSIS — I10 Essential (primary) hypertension: Secondary | ICD-10-CM | POA: Diagnosis not present

## 2021-08-23 DIAGNOSIS — E785 Hyperlipidemia, unspecified: Secondary | ICD-10-CM | POA: Insufficient documentation

## 2021-08-23 DIAGNOSIS — Z5111 Encounter for antineoplastic chemotherapy: Secondary | ICD-10-CM

## 2021-08-23 DIAGNOSIS — D376 Neoplasm of uncertain behavior of liver, gallbladder and bile ducts: Secondary | ICD-10-CM | POA: Diagnosis not present

## 2021-08-23 DIAGNOSIS — Z7952 Long term (current) use of systemic steroids: Secondary | ICD-10-CM | POA: Insufficient documentation

## 2021-08-23 DIAGNOSIS — C3412 Malignant neoplasm of upper lobe, left bronchus or lung: Secondary | ICD-10-CM | POA: Insufficient documentation

## 2021-08-23 DIAGNOSIS — F32A Depression, unspecified: Secondary | ICD-10-CM | POA: Diagnosis not present

## 2021-08-23 DIAGNOSIS — C7931 Secondary malignant neoplasm of brain: Secondary | ICD-10-CM | POA: Diagnosis not present

## 2021-08-23 DIAGNOSIS — C3490 Malignant neoplasm of unspecified part of unspecified bronchus or lung: Secondary | ICD-10-CM

## 2021-08-23 LAB — CBC WITH DIFFERENTIAL (CANCER CENTER ONLY)
Abs Immature Granulocytes: 0.03 10*3/uL (ref 0.00–0.07)
Basophils Absolute: 0 10*3/uL (ref 0.0–0.1)
Basophils Relative: 0 %
Eosinophils Absolute: 0.1 10*3/uL (ref 0.0–0.5)
Eosinophils Relative: 1 %
HCT: 38.4 % (ref 36.0–46.0)
Hemoglobin: 12.1 g/dL (ref 12.0–15.0)
Immature Granulocytes: 0 %
Lymphocytes Relative: 27 %
Lymphs Abs: 2 10*3/uL (ref 0.7–4.0)
MCH: 30.1 pg (ref 26.0–34.0)
MCHC: 31.5 g/dL (ref 30.0–36.0)
MCV: 95.5 fL (ref 80.0–100.0)
Monocytes Absolute: 0.6 10*3/uL (ref 0.1–1.0)
Monocytes Relative: 8 %
Neutro Abs: 4.6 10*3/uL (ref 1.7–7.7)
Neutrophils Relative %: 64 %
Platelet Count: 163 10*3/uL (ref 150–400)
RBC: 4.02 MIL/uL (ref 3.87–5.11)
RDW: 13.1 % (ref 11.5–15.5)
WBC Count: 7.2 10*3/uL (ref 4.0–10.5)
nRBC: 0 % (ref 0.0–0.2)

## 2021-08-23 LAB — CMP (CANCER CENTER ONLY)
ALT: 23 U/L (ref 0–44)
AST: 18 U/L (ref 15–41)
Albumin: 3.6 g/dL (ref 3.5–5.0)
Alkaline Phosphatase: 48 U/L (ref 38–126)
Anion gap: 8 (ref 5–15)
BUN: 24 mg/dL — ABNORMAL HIGH (ref 8–23)
CO2: 25 mmol/L (ref 22–32)
Calcium: 8.9 mg/dL (ref 8.9–10.3)
Chloride: 106 mmol/L (ref 98–111)
Creatinine: 1.29 mg/dL — ABNORMAL HIGH (ref 0.44–1.00)
GFR, Estimated: 43 mL/min — ABNORMAL LOW (ref >60)
Glucose, Bld: 106 mg/dL — ABNORMAL HIGH (ref 70–99)
Potassium: 3.8 mmol/L (ref 3.5–5.1)
Sodium: 139 mmol/L (ref 135–145)
Total Bilirubin: 0.4 mg/dL (ref 0.3–1.2)
Total Protein: 6.6 g/dL (ref 6.5–8.1)

## 2021-08-23 NOTE — Progress Notes (Signed)
[place] [phone]   [phone]  OFFICE PROGRESS NOTE  [name], MD [place] [address]  DIAGNOSIS: Stage IV (T1c, N1, M1c) non-small cell lung cancer, adenocarcinoma presented with locally advanced disease in the left lung  with a left upper lobe lung nodule in addition to left hilar adenopathy and a solitary left frontal brain metastasis s/p resection of the brain tumor with SRS.  This was diagnosed in July 2022.   Biomarker Findings Tumor Mutational Burden - 43 Muts/Mb Microsatellite status - MS-Stable Genomic Findings For a complete list of the genes assayed, please refer to the Appendix. EGFR L861Q MTAP loss CDKN2A/B CDKN2A loss, CDKN2B loss CUL4A amplification - equivocal? IRS2 amplification SF3B1 K666N TERT promoter -124C>T TP53 N247I 7 Disease relevant genes with no reportable alterations: ALK, BRAF, ERBB2, KRAS, MET, RET, ROS1   PDL1 Expression 95%   PRIOR THERAPY: Resection followed by [facility] of the solitary frontal lobe brain metastasis on July 28th, 2022    CURRENT THERAPY: Targeted treatment with Tagrisso 80 mg p.o. daily. First dose 05/27/21.  This was reduced to 40 mg p.o. daily on July 14, 2021 status post 3 months of treatment  INTERVAL HISTORY: Marie Hensley 75 y.o. female returns to the clinic today for follow-up visit accompanied by her daughter.  The patient is feeling fine today with no concerning complaints except for intermittent dizziness as well as feeling shaky probably from the treatment with Keppra.  She denied having any current chest pain, shortness of breath, cough or hemoptysis.  She has no nausea, vomiting, diarrhea or constipation.  She has no headache or visual changes.  She denied having any significant weight loss or night sweats.  She continues to tolerate her treatment with Tagrisso fairly well.  The patient is here today for evaluation and repeat blood work.  MEDICAL  HISTORY: Past Medical History:  Diagnosis Date   Arthritis    Brain tumor ([place])    Depression    Dyspnea    WITH EXERTION-SEEING DR [name]   HTN (hypertension)    Hyperlipemia    Seizures ([facility])     ALLERGIES:  has No Known Allergies.  MEDICATIONS:  Current Outpatient Medications  Medication Sig Dispense Refill   acetaminophen (TYLENOL) 500 MG tablet Take 1,000 mg by mouth every 6 (six) hours as needed for mild pain (or arthritis).     atorvastatin (LIPITOR) 40 MG tablet Take 1 tablet (40 mg total) by mouth at bedtime. 90 tablet 3   cetirizine (ZYRTEC) 10 MG tablet Take 10 mg by mouth daily as needed for allergies or rhinitis.     citalopram (CELEXA) 20 MG tablet Take 1 tablet (20 mg total) by mouth at bedtime. For depression 90 tablet 0   dexamethasone (DECADRON) 1 MG tablet Take 1 tablet (1 mg total) by mouth daily. 30 tablet 3   diphenoxylate-atropine (LOMOTIL) 2.5-0.025 MG tablet Take 1 tablet by mouth 4 (four) times daily as needed for diarrhea or loose stools. 30 tablet 0   fluticasone (FLONASE) 50 MCG/ACT nasal spray Place 1 spray into both nostrils daily as needed for allergies.     HYDROcodone-acetaminophen (NORCO/VICODIN) 5-325 MG tablet Take 1 tablet by mouth every 4 (four) hours as needed for moderate pain. 30 tablet 0   levETIRAcetam (KEPPRA) 500 MG tablet Take 2 tablets (1,000 mg total) by mouth 2 (two) times daily. 120 tablet 3   lisinopril-hydrochlorothiazide (ZESTORETIC) 10-12.5 MG tablet  Take 1 tablet by mouth daily. 90 tablet 3   loperamide (IMODIUM A-D) 2 MG tablet Take 2 mg by mouth 2 (two) times daily as needed for diarrhea or loose stools.     osimertinib mesylate (TAGRISSO) 40 MG tablet Take 1 tablet (40 mg total) by mouth daily. 30 tablet 2   potassium chloride SA (KLOR-CON) 20 MEQ tablet Take 2 tablets (40 mEq total) by mouth daily. 7 tablet 0   prochlorperazine (COMPAZINE) 10 MG tablet Take 1 tablet (10 mg total) by mouth every 6 (six) hours as needed for  nausea or vomiting. 30 tablet 0   propranolol ER (INDERAL LA) 60 MG 24 hr capsule Take 1 capsule (60 mg total) by mouth at bedtime. 90 capsule 3   No current facility-administered medications for this visit.    SURGICAL HISTORY:  Past Surgical History:  Procedure Laterality Date   APPENDECTOMY  8916   APPLICATION OF CRANIAL NAVIGATION N/A 04/08/2021   Procedure: APPLICATION OF CRANIAL NAVIGATION;  Surgeon: Judith Part, MD;  Location: [facility];  Service: Neurosurgery;  Laterality: N/A;  RM 21   CRANIOTOMY Left 04/08/2021   Procedure: Left Craniotomy for tumor resection with brainlab;  Surgeon: Judith Part, MD;  Location: [facility];  Service: Neurosurgery;  Laterality: Left;   EYE SURGERY Bilateral    CATARACTS   OOPHORECTOMY Right    VIDEO BRONCHOSCOPY WITH ENDOBRONCHIAL NAVIGATION Left 12/23/2020   Procedure: VIDEO BRONCHOSCOPY WITH ENDOBRONCHIAL NAVIGATION;  Surgeon: Tyler Pita, MD;  Location: [facility];  Service: Pulmonary;  Laterality: Left;   VIDEO BRONCHOSCOPY WITH ENDOBRONCHIAL NAVIGATION Left 02/01/2021   Procedure: ROBOTIC ASSISTED VIDEO BRONCHOSCOPY WITH ENDOBRONCHIAL NAVIGATION;  Surgeon: Tyler Pita, MD;  Location: [facility];  Service: Pulmonary;  Laterality: Left;   VIDEO BRONCHOSCOPY WITH ENDOBRONCHIAL ULTRASOUND Left 12/23/2020   Procedure: VIDEO BRONCHOSCOPY WITH ENDOBRONCHIAL ULTRASOUND;  Surgeon: Tyler Pita, MD;  Location: [facility];  Service: Pulmonary;  Laterality: Left;   VIDEO BRONCHOSCOPY WITH ENDOBRONCHIAL ULTRASOUND Left 02/01/2021   Procedure: ROBOTIC ASSITSTED VIDEO BRONCHOSCOPY WITH ENDOBRONCHIAL ULTRASOUND;  Surgeon: Tyler Pita, MD;  Location: [facility];  Service: Pulmonary;  Laterality: Left;    REVIEW OF SYSTEMS:  Constitutional: positive for fatigue Eyes: negative Ears, nose, mouth, throat, and face: negative Respiratory: negative Cardiovascular: negative Gastrointestinal: negative Genitourinary:negative Integument/breast:  negative Hematologic/lymphatic: negative Musculoskeletal:negative Neurological: positive for dizziness Behavioral/Psych: negative Endocrine: negative Allergic/Immunologic: negative   PHYSICAL EXAMINATION: General appearance: alert, cooperative, fatigued, and no distress Head: Normocephalic, without obvious abnormality, atraumatic Neck: no adenopathy, no JVD, supple, symmetrical, trachea midline, and thyroid not enlarged, symmetric, no tenderness/mass/nodules Lymph nodes: Cervical, supraclavicular, and axillary nodes normal. Resp: clear to auscultation bilaterally Back: symmetric, no curvature. ROM normal. No CVA tenderness. Cardio: regular rate and rhythm, S1, S2 normal, no murmur, click, rub or gallop GI: soft, non-tender; bowel sounds normal; no masses,  no organomegaly Extremities: extremities normal, atraumatic, no cyanosis or edema Neurologic: Alert and oriented X 3, normal strength and tone. Normal symmetric reflexes. Normal coordination and gait  ECOG PERFORMANCE STATUS: 1 - Symptomatic but completely ambulatory  Blood pressure 139/75, pulse 78, temperature (!) 97.3 F (36.3 C), temperature source Tympanic, resp. rate 18, height '5\' 4"'  (1.626 m), weight 158 lb 9.6 oz (71.9 kg), SpO2 97 %.  LABORATORY DATA: Lab Results  Component Value Date   WBC 7.2 08/23/2021   HGB 12.1 08/23/2021   HCT 38.4 08/23/2021   MCV 95.5 08/23/2021   PLT 163 08/23/2021  Chemistry      Component Value Date/Time   NA 139 08/23/2021 0956   K 3.8 08/23/2021 0956   CL 106 08/23/2021 0956   CO2 25 08/23/2021 0956   BUN 24 (H) 08/23/2021 0956   CREATININE 1.29 (H) 08/23/2021 0956      Component Value Date/Time   CALCIUM 8.9 08/23/2021 0956   ALKPHOS 48 08/23/2021 0956   AST 18 08/23/2021 0956   ALT 23 08/23/2021 0956   BILITOT 0.4 08/23/2021 0956       RADIOGRAPHIC STUDIES: CT Abdomen Pelvis Wo Contrast  Result Date: 07/29/2021 CLINICAL DATA:  Primary Cancer Type: Lung Imaging  Indication: Assess response to therapy Interval therapy since last imaging? Yes Initial Cancer Diagnosis Date: 12/23/2020; Established by: Presumed Detailed Pathology: Stage IV non-small cell lung cancer, adenocarcinoma. Primary Tumor location: Left upper lobe. Surgeries: Craniotomy.  Appendectomy.  Right oophorectomy. Chemotherapy: Yes; Ongoing?  Yes; Tagrisso daily Immunotherapy? No Radiation therapy? Yes; Date Range: 04/06/2021; Target: Brain, left frontal lobe EXAM: CT CHEST, ABDOMEN AND PELVIS WITHOUT CONTRAST TECHNIQUE: Multidetector CT imaging of the chest, abdomen and pelvis was performed following the standard protocol without IV contrast. COMPARISON:  Most recent CT chest 01/13/2021.  12/22/2020 PET-CT. FINDINGS: CT CHEST FINDINGS Cardiovascular: Heart size appears within normal limits. Aortic atherosclerosis. No pericardial effusion. Mediastinum/Nodes: Normal appearance of the thyroid gland. The trachea appears patent and is midline. Normal appearance of the esophagus. No enlarged supraclavicular, axillary, mediastinal or hilar lymph nodes. Lungs/Pleura: No pleural effusion. Nodule within the left upper lobe measures 1.6 x 1.6 by 1.6 cm (volume = 2.1 cm^3), image 45/4. Formally this measured 1.8 x 1.6 1.3 cm (volume = 2 cm^3) tiny subpleural nodule within the lingula is stable measuring 3 mm, image 102/4. No new lung nodules identified. Musculoskeletal: No acute or suspicious osseous findings. CT ABDOMEN PELVIS FINDINGS Hepatobiliary: Within the periphery of the right hepatic lobe there is a low-attenuation structure measuring 1.8 x 1.0 cm, image 60/2. Not confidently identified on previous imaging Pancreas: Unremarkable. No pancreatic ductal dilatation or surrounding inflammatory changes. Spleen: Normal in size without focal abnormality. Adrenals/Urinary Tract: Normal adrenal glands. No kidney mass, nephrolithiasis, or hydronephrosis. Urinary bladder is unremarkable. Stomach/Bowel: Stomach appears  normal. No bowel wall thickening, inflammation, or distension. Distal colonic diverticula noted without signs of acute inflammation. Vascular/Lymphatic: No significant vascular findings are present. No enlarged abdominal or pelvic lymph nodes. Reproductive: Uterus and bilateral adnexa are unremarkable. Other: No abdominal wall hernia or abnormality. No abdominopelvic ascites. Musculoskeletal: No acute or significant osseous findings. IMPRESSION: 1. No significant change in the appearance of left upper lobe lung nodule. 2. There is a new, indeterminate low-attenuation structure within the periphery of the right hepatic lobe measuring 1.8 x 1.0 cm. Suspicious for new site of disease. More definitive characterization could be obtained with contrast enhanced MRI of the liver. 3. Aortic Atherosclerosis (ICD10-I70.0). Electronically Signed   By: Kerby Moors M.D.   On: 07/29/2021 10:34   CT Chest Wo Contrast  Result Date: 07/29/2021 CLINICAL DATA:  Primary Cancer Type: Lung Imaging Indication: Assess response to therapy Interval therapy since last imaging? Yes Initial Cancer Diagnosis Date: 12/23/2020; Established by: Presumed Detailed Pathology: Stage IV non-small cell lung cancer, adenocarcinoma. Primary Tumor location: Left upper lobe. Surgeries: Craniotomy.  Appendectomy.  Right oophorectomy. Chemotherapy: Yes; Ongoing?  Yes; Tagrisso daily Immunotherapy? No Radiation therapy? Yes; Date Range: 04/06/2021; Target: Brain, left frontal lobe EXAM: CT CHEST, ABDOMEN AND PELVIS WITHOUT CONTRAST TECHNIQUE: Multidetector CT imaging of the  chest, abdomen and pelvis was performed following the standard protocol without IV contrast. COMPARISON:  Most recent CT chest 01/13/2021.  12/22/2020 PET-CT. FINDINGS: CT CHEST FINDINGS Cardiovascular: Heart size appears within normal limits. Aortic atherosclerosis. No pericardial effusion. Mediastinum/Nodes: Normal appearance of the thyroid gland. The trachea appears patent and is  midline. Normal appearance of the esophagus. No enlarged supraclavicular, axillary, mediastinal or hilar lymph nodes. Lungs/Pleura: No pleural effusion. Nodule within the left upper lobe measures 1.6 x 1.6 by 1.6 cm (volume = 2.1 cm^3), image 45/4. Formally this measured 1.8 x 1.6 1.3 cm (volume = 2 cm^3) tiny subpleural nodule within the lingula is stable measuring 3 mm, image 102/4. No new lung nodules identified. Musculoskeletal: No acute or suspicious osseous findings. CT ABDOMEN PELVIS FINDINGS Hepatobiliary: Within the periphery of the right hepatic lobe there is a low-attenuation structure measuring 1.8 x 1.0 cm, image 60/2. Not confidently identified on previous imaging Pancreas: Unremarkable. No pancreatic ductal dilatation or surrounding inflammatory changes. Spleen: Normal in size without focal abnormality. Adrenals/Urinary Tract: Normal adrenal glands. No kidney mass, nephrolithiasis, or hydronephrosis. Urinary bladder is unremarkable. Stomach/Bowel: Stomach appears normal. No bowel wall thickening, inflammation, or distension. Distal colonic diverticula noted without signs of acute inflammation. Vascular/Lymphatic: No significant vascular findings are present. No enlarged abdominal or pelvic lymph nodes. Reproductive: Uterus and bilateral adnexa are unremarkable. Other: No abdominal wall hernia or abnormality. No abdominopelvic ascites. Musculoskeletal: No acute or significant osseous findings. IMPRESSION: 1. No significant change in the appearance of left upper lobe lung nodule. 2. There is a new, indeterminate low-attenuation structure within the periphery of the right hepatic lobe measuring 1.8 x 1.0 cm. Suspicious for new site of disease. More definitive characterization could be obtained with contrast enhanced MRI of the liver. 3. Aortic Atherosclerosis (ICD10-I70.0). Electronically Signed   By: Kerby Moors M.D.   On: 07/29/2021 10:34   MR LIVER W WO CONTRAST  Result Date:  08/13/2021 CLINICAL DATA:  Possible hepatic mass on recent noncontrast CT. Lung carcinoma. EXAM: MRI ABDOMEN WITHOUT AND WITH CONTRAST TECHNIQUE: Multiplanar multisequence MR imaging of the abdomen was performed both before and after the administration of intravenous contrast. CONTRAST:  46m GADAVIST GADOBUTROL 1 MMOL/ML IV SOLN COMPARISON:  Noncontrast CT on 07/28/2021 FINDINGS: Lower chest: No acute findings. Hepatobiliary: A hypovascular mass with thin peripheral contrast enhancement, restricted diffusion, and moderate T2 hyperintensity is seen in segment 5 of the right hepatic lobe. This measures 1.8 x 1.4 cm on image 38/24. A tiny sub-cm cyst or hemangioma is seen in segment 2 of the left lobe (image 9/4). Gallbladder is unremarkable. No evidence of biliary ductal dilatation. Pancreas:  No mass or inflammatory changes. Spleen:  Within normal limits in size and appearance. Adrenals/Urinary Tract: No masses identified. No evidence of hydronephrosis. Stomach/Bowel: Visualized portion unremarkable. Vascular/Lymphatic: No pathologically enlarged lymph nodes identified. No acute vascular findings. Other:  None. Musculoskeletal:  No suspicious bone lesions identified. IMPRESSION: 1.8 cm hypovascular mass in the anterior right hepatic lobe. Although characteristics are nonspecific, this raises suspicion for liver metastasis. No other evidence of metastatic disease within the abdomen. Electronically Signed   By: JMarlaine HindM.D.   On: 08/13/2021 12:40    ASSESSMENT AND PLAN: This is a very pleasant 75years old white female diagnosed with a stage IV (T1c, N1, M1 C) non-small cell lung cancer, adenocarcinoma with positive EGFR mutation (([id number] presented with locally advanced disease in the left lung including left upper lobe lung nodule in addition to left  hilar adenopathy and solitary left frontal brain metastasis s/p resection followed by SRS diagnosed in July 2022. The patient is currently treatment with Tagrisso  80 mg p.o. daily and has been tolerating the treatment well except for significant diarrhea the last 7-10 days not responding to treatment with Imodium and Lomotil.  Her dose of Tagrisso was reduced to 40 mg p.o. daily on July 14, 2021.  The patient is tolerating the reduced dose much better with no concerning complaints. She had MRI of the liver performed recently that showed a suspicious 1.8 cm hypervascular mass in the anterior right hepatic lobe that nonspecific but again raises concern for liver metastasis.  The scan showed no other metastatic disease within the abdomen or pelvis. I recommended for the patient to continue her current treatment with Tagrisso at 40 mg p.o. daily for now. I will see her back for follow-up visit in around 5 weeks with repeat CT scan of the chest, abdomen and pelvis for restaging of her disease and close monitoring of the suspicious liver lesion. The patient was advised to call immediately if she has any other concerning symptoms in the interval. The patient voices understanding of current disease status and treatment options and is in agreement with the current care plan.  All questions were answered. The patient knows to call the clinic with any problems, questions or concerns. We can certainly see the patient much sooner if necessary.  The total time spent in the appointment was 35 minutes.  Disclaimer: This note was dictated with voice recognition software. Similar sounding words can inadvertently be transcribed and may not be corrected upon review.

## 2021-09-14 ENCOUNTER — Other Ambulatory Visit: Payer: Self-pay | Admitting: Internal Medicine

## 2021-09-15 ENCOUNTER — Telehealth: Payer: Self-pay

## 2021-09-15 NOTE — Telephone Encounter (Signed)
Oral Oncology Patient Advocate Encounter  Prior Authorization for [name] has been approved.    PA# [id number] Effective dates: 09/15/21 through 09/11/22  Oral Oncology Clinic will continue to follow.   [place] Patient [place] Phone [phone] Fax [phone] 09/15/2021 7:48 AM

## 2021-09-15 NOTE — Telephone Encounter (Signed)
Oral Oncology Patient Advocate Encounter   Received notification from [employer] that prior authorization for Tagrisso is required.   PA submitted on CoverMyMeds Key [id number] Status is pending   Oral Oncology Clinic will continue to follow.  [place] Patient [place] Phone [phone] Fax [phone] 09/15/2021 7:43 AM

## 2021-09-21 ENCOUNTER — Other Ambulatory Visit: Payer: Self-pay

## 2021-09-21 ENCOUNTER — Other Ambulatory Visit: Payer: Self-pay | Admitting: Internal Medicine

## 2021-09-21 ENCOUNTER — Ambulatory Visit
Admission: RE | Admit: 2021-09-21 | Discharge: 2021-09-21 | Disposition: A | Discharge status: Home / Self Care | Payer: Medicare HMO | Source: Ambulatory Visit | Attending: Internal Medicine | Admitting: Internal Medicine

## 2021-09-21 DIAGNOSIS — J984 Other disorders of lung: Secondary | ICD-10-CM | POA: Diagnosis not present

## 2021-09-21 DIAGNOSIS — C3490 Malignant neoplasm of unspecified part of unspecified bronchus or lung: Secondary | ICD-10-CM

## 2021-09-21 DIAGNOSIS — I700 Atherosclerosis of aorta: Secondary | ICD-10-CM | POA: Diagnosis not present

## 2021-09-21 DIAGNOSIS — C7931 Secondary malignant neoplasm of brain: Secondary | ICD-10-CM | POA: Diagnosis not present

## 2021-09-21 DIAGNOSIS — K5730 Diverticulosis of large intestine without perforation or abscess without bleeding: Secondary | ICD-10-CM | POA: Diagnosis not present

## 2021-09-21 IMAGING — CT CT CHEST-ABD-PELV W/ CM
2 of 5 series · 13 of 36 positions shown, 15 images · IV contrast (agent unspecified)
Comparison: MR abdomen, [DATE], CT chest abdomen pelvis,
[DATE]

CLINICAL DATA: Non-small cell lung cancer restaging, metastatic to
brain

EXAM:
CT CHEST, ABDOMEN, AND PELVIS WITH CONTRAST
TECHNIQUE: Multidetector CT imaging of the chest, abdomen and pelvis was
performed following the standard protocol during bolus
administration of intravenous contrast.

[Series 2: cap with · axial · 0.69mm/px · z∈[-870,-346]mm · 10 of 129 slices shown, 12 images]
[im 12/129  mediastinal]
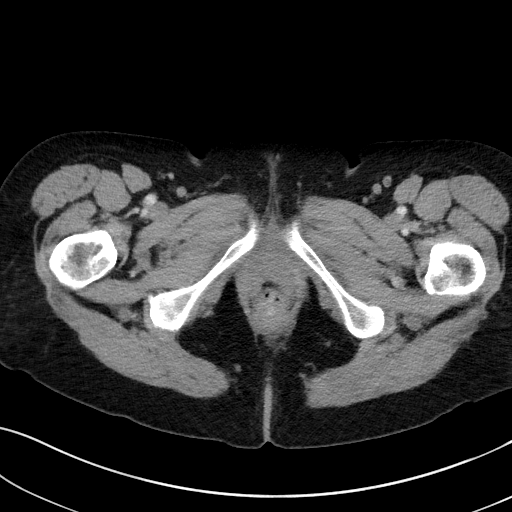
[im 12/129  bone]
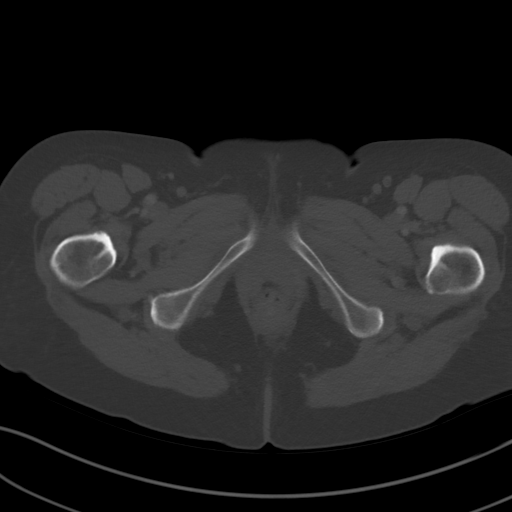
[im 24/129  mediastinal]
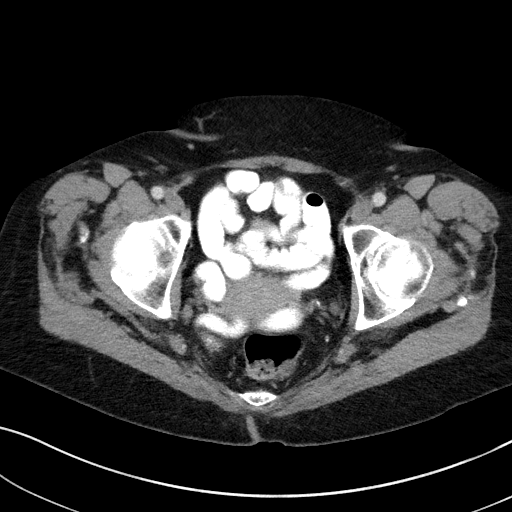
[im 35/129  mediastinal]
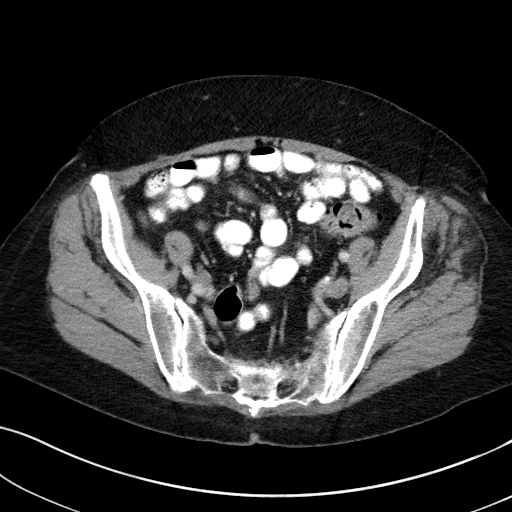
[im 47/129  mediastinal]
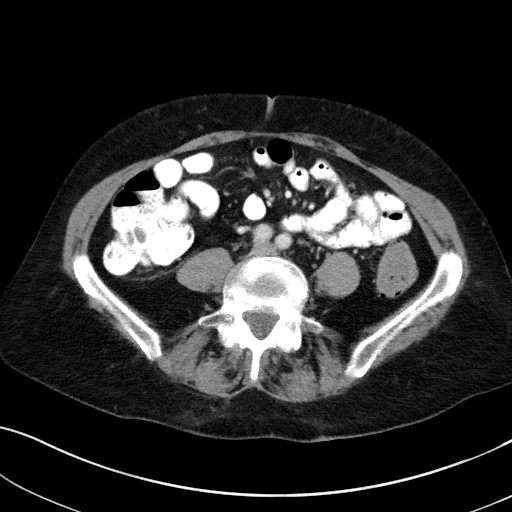
[im 59/129  mediastinal]
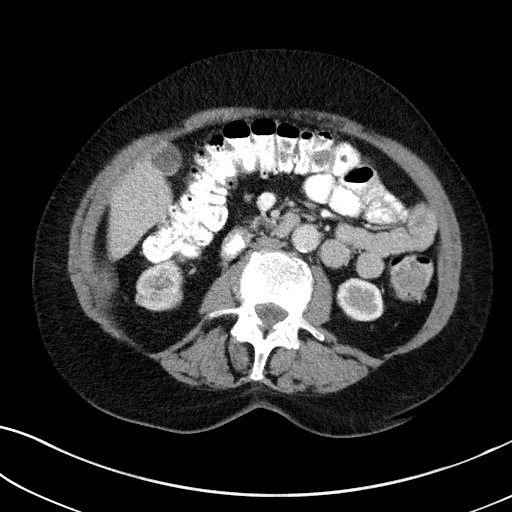
[im 70/129  mediastinal]
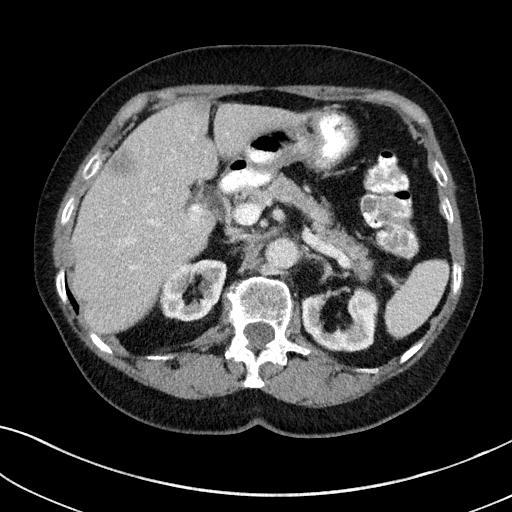
[im 82/129  mediastinal]
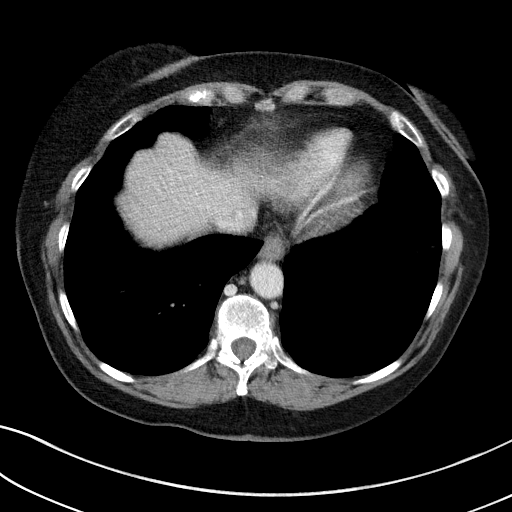
[im 94/129  mediastinal]
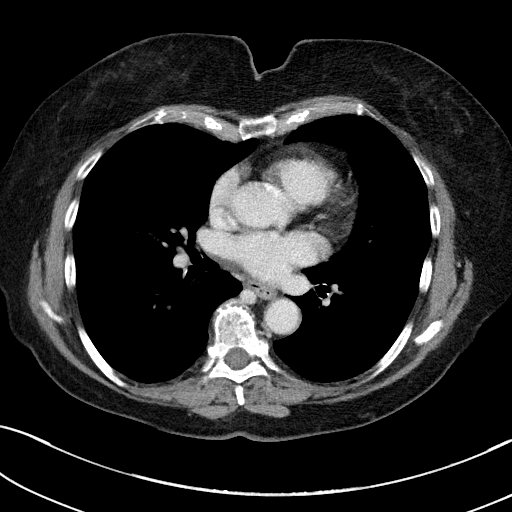
[im 105/129  mediastinal]
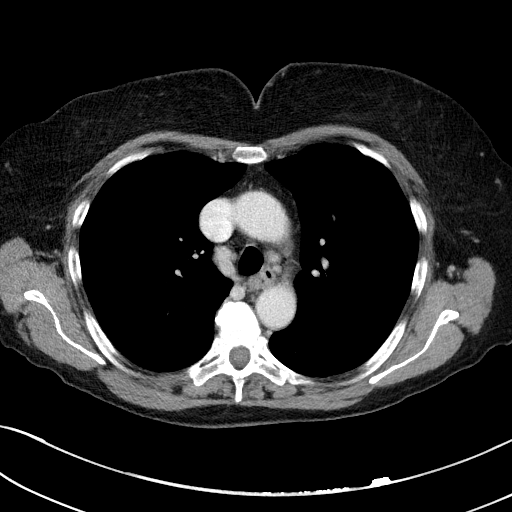
[im 105/129  bone]
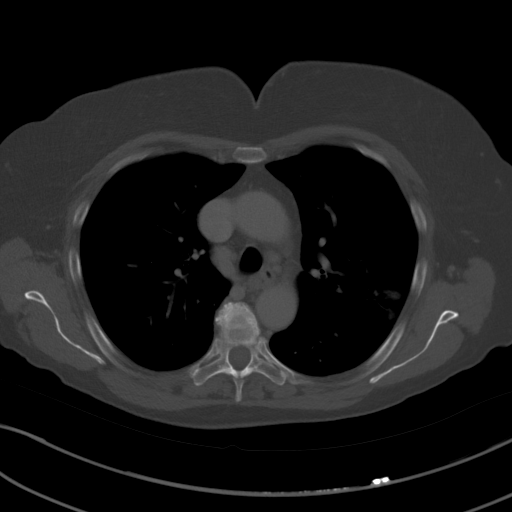
[im 117/129  mediastinal]
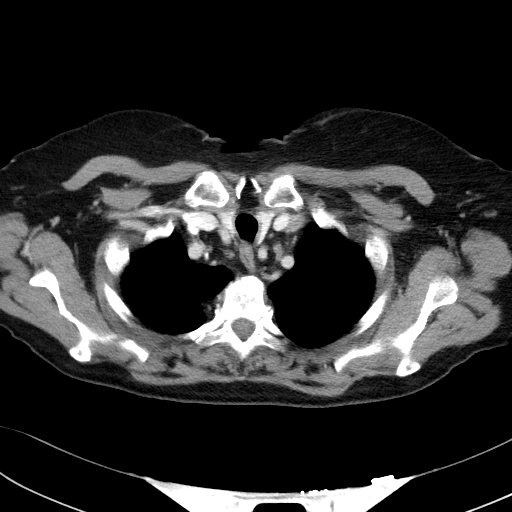

[Series 4: coronals · coronal · 0.83mm/px · 3 of 136 slices shown]
[im 28/136  mediastinal]
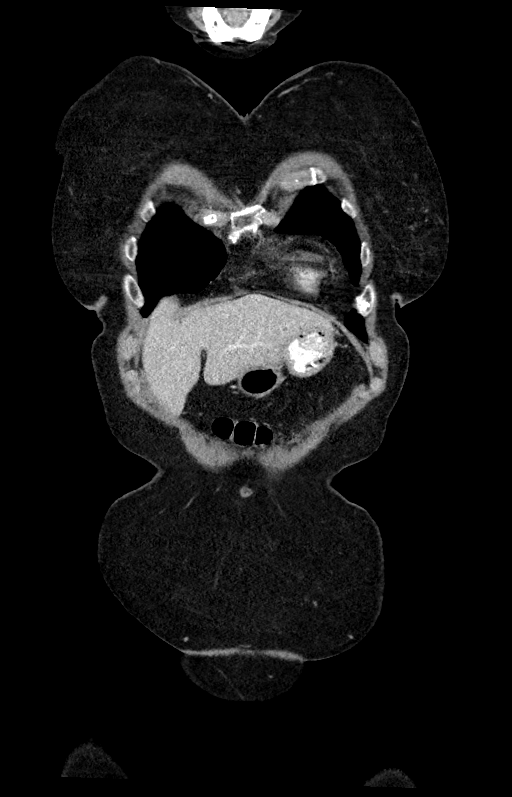
[im 55/136  mediastinal]
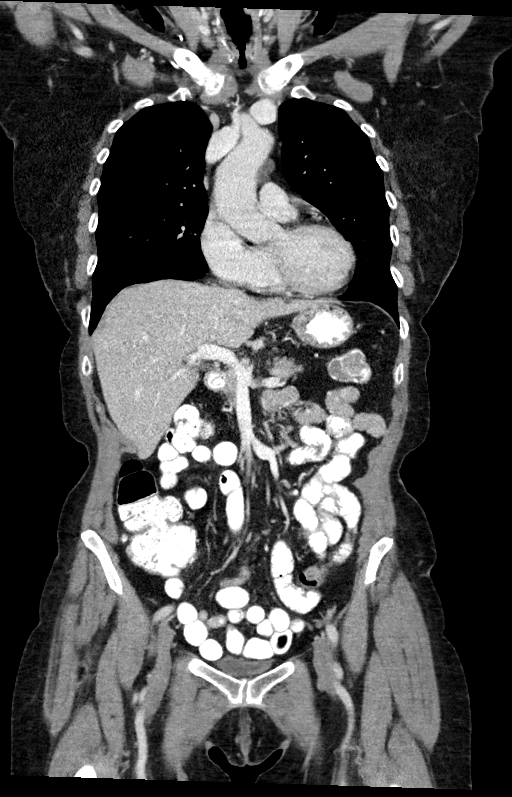
[im 82/136  mediastinal]
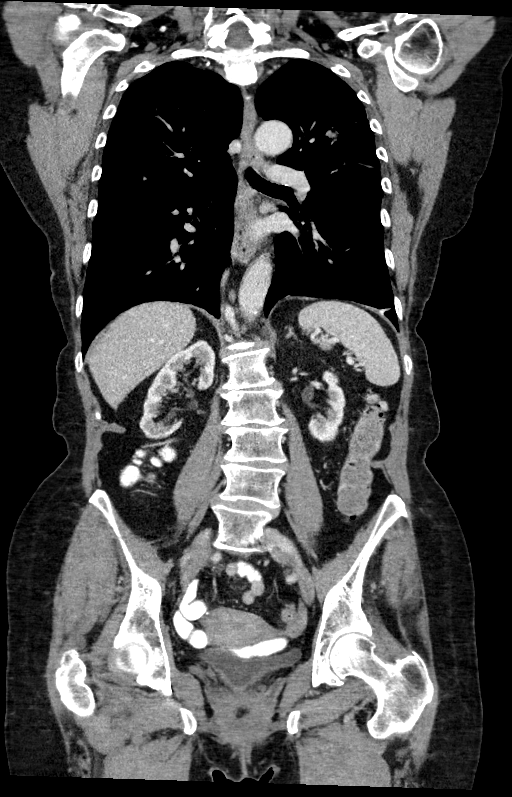

[13 of 36 positions shown; findings below may reference images not displayed]

RADIATION DOSE REDUCTION: This exam was performed according to the
departmental dose-optimization program which includes automated
exposure control, adjustment of the mA and/or kV according to
patient size and/or use of iterative reconstruction technique.

CONTRAST:  80mL OMNIPAQUE IOHEXOL 300 MG/ML SOLN, additional oral
enteric contrast
FINDINGS: CT CHEST FINDINGS

Cardiovascular: Aortic atherosclerosis. Normal heart size. No
pericardial effusion.

Mediastinum/Nodes: No enlarged mediastinal, hilar, or axillary lymph
nodes. Thyroid gland, trachea, and esophagus demonstrate no
significant findings.

Lungs/Pleura: Unchanged, spiculated mass of the posterior left upper
lobe measuring 1.6 x 1.5 cm (series 3, image 52). Bandlike scarring
the bilateral lung bases. No pleural effusion or pneumothorax.

Musculoskeletal: No chest wall mass or suspicious osseous lesions
identified.

CT ABDOMEN PELVIS FINDINGS

Hepatobiliary: Significant interval enlargement of a hypoenhancing
mass of the anterior right lobe of the liver, hepatic segment V,
measuring 2.2 x 2.2 cm, previously 1.8 x 1.4 cm by MR (series 2,
image 61). No gallstones, gallbladder wall thickening, or biliary
dilatation.

Pancreas: Unremarkable. No pancreatic ductal dilatation or
surrounding inflammatory changes.

Spleen: Normal in size without significant abnormality.

Adrenals/Urinary Tract: Adrenal glands are unremarkable. Kidneys are
normal, without renal calculi, solid lesion, or hydronephrosis.
Bladder is unremarkable.

Stomach/Bowel: Stomach is within normal limits. Appendix is
surgically absent. No evidence of bowel wall thickening, distention,
or inflammatory changes. Descending and sigmoid diverticulosis.

Vascular/Lymphatic: Aortic atherosclerosis. No enlarged abdominal or
pelvic lymph nodes.

Reproductive: No mass or other abnormality.

Other: No abdominal wall hernia or abnormality. No ascites.

Musculoskeletal: No acute osseous findings.
IMPRESSION: 1. Unchanged, spiculated mass of the posterior left upper lobe
measuring 1.6 x 1.5 cm.
2. Significant interval enlargement of a hypoenhancing mass of the
anterior right lobe of the liver, consistent with an enlarging
hepatic metastasis.
3. Diverticulosis without evidence of acute diverticulitis.

Aortic Atherosclerosis ([5B]-[5B]).

## 2021-09-21 MED ORDER — IOHEXOL 300 MG/ML  SOLN
80.0000 mL | Freq: Once | INTRAMUSCULAR | Status: AC | PRN
  Administered 2021-09-21: 80 mL via INTRAVENOUS

## 2021-09-27 ENCOUNTER — Inpatient Hospital Stay: Payer: Medicare HMO

## 2021-09-30 ENCOUNTER — Inpatient Hospital Stay: Payer: Medicare HMO | Admitting: Internal Medicine

## 2021-09-30 ENCOUNTER — Other Ambulatory Visit: Payer: Medicare HMO

## 2021-10-01 NOTE — Progress Notes (Signed)
[facility] OFFICE PROGRESS NOTE  Marie Noe, MD [place] [address]  DIAGNOSIS: Stage IV (T1c, N1, M1c) non-small cell lung cancer, adenocarcinoma presented with locally advanced disease in the left lung  with a left upper lobe lung nodule in addition to left hilar adenopathy and a solitary left frontal brain metastasis s/p resection of the brain tumor with SRS.  This was diagnosed in July 2022.   Biomarker Findings Tumor Mutational Burden - 43 Muts/Mb Microsatellite status - MS-Stable Genomic Findings For a complete list of the genes assayed, please refer to the Appendix. EGFR L861Q MTAP loss CDKN2A/B CDKN2A loss, CDKN2B loss CUL4A amplification - equivocal IRS2 amplification SF3B1 K666N TERT promoter -124C>T TP53 N247I 7 Disease relevant genes with no reportable alterations: ALK, BRAF, ERBB2, KRAS, MET, RET, ROS1   PDL1 Expression 95%  PRIOR THERAPY: Resection followed by [facility] of the solitary frontal lobe brain metastasis on July 28th, 2022   CURRENT THERAPY: Targeted treatment with Tagrisso 80 mg p.o. daily. First dose 05/27/21.  This was reduced to 40 mg p.o. daily on July 14, 2021 status post 4 months of treatment.   INTERVAL HISTORY: Marie Hensley 76 y.o. female returns to the clinic today for a follow-up visit accompanied by her daughter. The patient is feeling fine today with no concerning complaints.     The patient is on targeted treatment with Tagrisso.  She started this in September 2022.  She was tolerating this fairly well at the beginning but developed profuse uncontrollable diarrhea and consequently some electrolyte derangements. Therefore, her dose had to be reduced in November 2022. She is currently on 40 mg p.o. daily of Tagrisso. She is tolerating this much better.   Otherwise, she denies any new concerning complaints today.  She denies any fevers, chills, or significant weight loss. She sometimes gets intermittent  night sweats due to a warm sleeping environment with a lot of blankets. Denies any chest pain, cough, or hemoptysis.  She reports dyspnea on exertion only with strenuous activities if she is "overdoing it".  She denies any nausea, vomiting, or constipation. She denies any unusual diarrhea. She follows up with neuro-oncology for history of metastatic disease to the brain for which she previously underwent SRS and resection of the solitary brain metastasis. Underwent speech therapy due to her trouble with word finding. She is scheduled for a repeat brain MRI later this week on 10/08/21 and is expecting a follow up with Dr. Mickeal Skinner next week. She also is scheduled to see her eye doctor for her visual changes on 10/07/21. The patient recently had a restaging CT scan performed.  She is here today for evaluation and repeat blood work.    MEDICAL HISTORY: Past Medical History:  Diagnosis Date   Arthritis    Brain tumor ([facility])    Depression    Dyspnea    WITH EXERTION-SEEING DR [name]   HTN (hypertension)    Hyperlipemia    Seizures ([facility])     ALLERGIES:  has No Known Allergies.  MEDICATIONS:  Current Outpatient Medications  Medication Sig Dispense Refill   acetaminophen (TYLENOL) 500 MG tablet Take 1,000 mg by mouth every 6 (six) hours as needed for mild pain (or arthritis).     atorvastatin (LIPITOR) 40 MG tablet Take 1 tablet (40 mg total) by mouth at bedtime. 90 tablet 3   cetirizine (ZYRTEC) 10 MG tablet Take 10 mg by mouth daily as needed for allergies or rhinitis.  citalopram (CELEXA) 20 MG tablet Take 1 tablet (20 mg total) by mouth at bedtime. For depression 90 tablet 0   dexamethasone (DECADRON) 1 MG tablet Take 1 tablet (1 mg total) by mouth daily. 30 tablet 3   diphenoxylate-atropine (LOMOTIL) 2.5-0.025 MG tablet Take 1 tablet by mouth 4 (four) times daily as needed for diarrhea or loose stools. 30 tablet 0   fluticasone (FLONASE) 50 MCG/ACT nasal spray Place 1 spray into both  nostrils daily as needed for allergies.     HYDROcodone-acetaminophen (NORCO/VICODIN) 5-325 MG tablet Take 1 tablet by mouth every 4 (four) hours as needed for moderate pain. 30 tablet 0   levETIRAcetam (KEPPRA) 500 MG tablet TAKE TWO TABLETS BY MOUTH TWICE A DAY 120 tablet 3   lisinopril-hydrochlorothiazide (ZESTORETIC) 10-12.5 MG tablet Take 1 tablet by mouth daily. 90 tablet 3   loperamide (IMODIUM A-D) 2 MG tablet Take 2 mg by mouth 2 (two) times daily as needed for diarrhea or loose stools.     osimertinib mesylate (TAGRISSO) 40 MG tablet Take 1 tablet (40 mg total) by mouth daily. 30 tablet 2   potassium chloride SA (KLOR-CON) 20 MEQ tablet Take 2 tablets (40 mEq total) by mouth daily. 7 tablet 0   prochlorperazine (COMPAZINE) 10 MG tablet Take 1 tablet (10 mg total) by mouth every 6 (six) hours as needed for nausea or vomiting. 30 tablet 0   propranolol ER (INDERAL LA) 60 MG 24 hr capsule Take 1 capsule (60 mg total) by mouth at bedtime. 90 capsule 3   No current facility-administered medications for this visit.    SURGICAL HISTORY:  Past Surgical History:  Procedure Laterality Date   APPENDECTOMY  1610   APPLICATION OF CRANIAL NAVIGATION N/A 04/08/2021   Procedure: APPLICATION OF CRANIAL NAVIGATION;  Surgeon: Judith Part, MD;  Location: [facility];  Service: Neurosurgery;  Laterality: N/A;  RM 21   CRANIOTOMY Left 04/08/2021   Procedure: Left Craniotomy for tumor resection with brainlab;  Surgeon: Judith Part, MD;  Location: [facility];  Service: Neurosurgery;  Laterality: Left;   EYE SURGERY Bilateral    CATARACTS   OOPHORECTOMY Right    VIDEO BRONCHOSCOPY WITH ENDOBRONCHIAL NAVIGATION Left 12/23/2020   Procedure: VIDEO BRONCHOSCOPY WITH ENDOBRONCHIAL NAVIGATION;  Surgeon: Tyler Pita, MD;  Location: [facility];  Service: Pulmonary;  Laterality: Left;   VIDEO BRONCHOSCOPY WITH ENDOBRONCHIAL NAVIGATION Left 02/01/2021   Procedure: ROBOTIC ASSISTED VIDEO BRONCHOSCOPY WITH  ENDOBRONCHIAL NAVIGATION;  Surgeon: Tyler Pita, MD;  Location: [facility];  Service: Pulmonary;  Laterality: Left;   VIDEO BRONCHOSCOPY WITH ENDOBRONCHIAL ULTRASOUND Left 12/23/2020   Procedure: VIDEO BRONCHOSCOPY WITH ENDOBRONCHIAL ULTRASOUND;  Surgeon: Tyler Pita, MD;  Location: [facility];  Service: Pulmonary;  Laterality: Left;   VIDEO BRONCHOSCOPY WITH ENDOBRONCHIAL ULTRASOUND Left 02/01/2021   Procedure: ROBOTIC ASSITSTED VIDEO BRONCHOSCOPY WITH ENDOBRONCHIAL ULTRASOUND;  Surgeon: Tyler Pita, MD;  Location: [facility];  Service: Pulmonary;  Laterality: Left;    REVIEW OF SYSTEMS:   Review of Systems  Constitutional: Negative for appetite change, chills, fatigue, fever and unexpected weight change.  HENT: Negative for mouth sores, nosebleeds, sore throat and trouble swallowing.   Eyes: Negative for eye problems and icterus.  Respiratory: Positive for mild dyspnea with certain activities. Negative for cough, hemoptysis, and wheezing. Cardiovascular: Negative for chest pain and leg swelling.  Gastrointestinal: Negative for abdominal pain, constipation, diarrhea, nausea and vomiting.  Genitourinary: Negative for bladder incontinence, difficulty urinating, dysuria, frequency and hematuria.  Musculoskeletal: Negative for back pain, gait problem, neck pain and neck stiffness.  Skin: Negative for itching and rash.  Neurological: Positive for trouble with word finding (improved compared to prior). Negative for dizziness, extremity weakness, gait problem, headaches, light-headedness and seizures.  Hematological: Negative for adenopathy. Does not bruise/bleed easily.  Psychiatric/Behavioral: Negative for confusion, depression and sleep disturbance. The patient is not nervous/anxious.     PHYSICAL EXAMINATION:  Blood pressure 120/76, pulse 84, temperature (!) 97.2 F (36.2 C), temperature source Temporal, resp. rate 17, weight 163 lb 1 oz (74 kg), SpO2 97 %.  ECOG PERFORMANCE  STATUS: 1  Physical Exam  Constitutional: Oriented to person, place, and time and well-developed, well-nourished, and in no distress.  HENT:  Head: Normocephalic and atraumatic.  Mouth/Throat: Oropharynx is clear and moist. No oropharyngeal exudate.  Eyes: Conjunctivae are normal. Right eye exhibits no discharge. Left eye exhibits no discharge. No scleral icterus.  Neck: Normal range of motion. Neck supple.  Cardiovascular: Normal rate, regular rhythm, normal heart sounds and intact distal pulses.   Pulmonary/Chest: Effort normal and breath sounds normal. No respiratory distress. No wheezes. No rales.  Abdominal: Soft. Bowel sounds are normal. Exhibits no distension and no mass. There is no tenderness.  Musculoskeletal: Normal range of motion. Exhibits no edema.  Lymphadenopathy:    No cervical adenopathy.  Neurological: Alert and oriented to person, place, and time. Exhibits normal muscle tone. Gait normal. Coordination normal. Trouble with word finding but improved compared to prior.  Skin: Skin is warm and dry. No rash noted. Not diaphoretic. No erythema. No pallor.  Psychiatric: Mood, memory and judgment normal.  Vitals reviewed.  LABORATORY DATA: Lab Results  Component Value Date   WBC 6.5 10/04/2021   HGB 12.3 10/04/2021   HCT 39.1 10/04/2021   MCV 94.2 10/04/2021   PLT 163 10/04/2021      Chemistry      Component Value Date/Time   NA 140 10/04/2021 1113   K 3.7 10/04/2021 1113   CL 106 10/04/2021 1113   CO2 27 10/04/2021 1113   BUN 24 (H) 10/04/2021 1113   CREATININE 1.46 (H) 10/04/2021 1113      Component Value Date/Time   CALCIUM 9.5 10/04/2021 1113   ALKPHOS 47 10/04/2021 1113   AST 21 10/04/2021 1113   ALT 27 10/04/2021 1113   BILITOT 0.4 10/04/2021 1113       RADIOGRAPHIC STUDIES:  CT CHEST ABDOMEN PELVIS W CONTRAST  Result Date: 09/21/2021 CLINICAL DATA:  Non-small cell lung cancer restaging, metastatic to brain EXAM: CT CHEST, ABDOMEN, AND PELVIS  WITH CONTRAST TECHNIQUE: Multidetector CT imaging of the chest, abdomen and pelvis was performed following the standard protocol during bolus administration of intravenous contrast. RADIATION DOSE REDUCTION: This exam was performed according to the departmental dose-optimization program which includes automated exposure control, adjustment of the mA and/or kV according to patient size and/or use of iterative reconstruction technique. CONTRAST:  25m OMNIPAQUE IOHEXOL 300 MG/ML SOLN, additional oral enteric contrast COMPARISON:  MR abdomen, 08/12/2021, CT chest abdomen pelvis, 07/29/2021 FINDINGS: CT CHEST FINDINGS Cardiovascular: Aortic atherosclerosis. Normal heart size. No pericardial effusion. Mediastinum/Nodes: No enlarged mediastinal, hilar, or axillary lymph nodes. Thyroid gland, trachea, and esophagus demonstrate no significant findings. Lungs/Pleura: Unchanged, spiculated mass of the posterior left upper lobe measuring 1.6 x 1.5 cm (series 3, image 52). Bandlike scarring the bilateral lung bases. No pleural effusion or pneumothorax. Musculoskeletal: No chest wall mass or suspicious osseous lesions identified. CT ABDOMEN PELVIS FINDINGS Hepatobiliary: Significant  interval enlargement of a hypoenhancing mass of the anterior right lobe of the liver, hepatic segment V, measuring 2.2 x 2.2 cm, previously 1.8 x 1.4 cm by MR (series 2, image 61). No gallstones, gallbladder wall thickening, or biliary dilatation. Pancreas: Unremarkable. No pancreatic ductal dilatation or surrounding inflammatory changes. Spleen: Normal in size without significant abnormality. Adrenals/Urinary Tract: Adrenal glands are unremarkable. Kidneys are normal, without renal calculi, solid lesion, or hydronephrosis. Bladder is unremarkable. Stomach/Bowel: Stomach is within normal limits. Appendix is surgically absent. No evidence of bowel wall thickening, distention, or inflammatory changes. Descending and sigmoid diverticulosis.  Vascular/Lymphatic: Aortic atherosclerosis. No enlarged abdominal or pelvic lymph nodes. Reproductive: No mass or other abnormality. Other: No abdominal wall hernia or abnormality. No ascites. Musculoskeletal: No acute osseous findings. IMPRESSION: 1. Unchanged, spiculated mass of the posterior left upper lobe measuring 1.6 x 1.5 cm. 2. Significant interval enlargement of a hypoenhancing mass of the anterior right lobe of the liver, consistent with an enlarging hepatic metastasis. 3. Diverticulosis without evidence of acute diverticulitis. Aortic Atherosclerosis (ICD10-I70.0). Electronically Signed   By: Delanna Ahmadi M.D.   On: 09/21/2021 16:38     ASSESSMENT/PLAN:  This is a very pleasant 76 years old white female diagnosed with stage IV (T1c, N1, M1c) non-small cell lung cancer, adenocarcinoma presented with locally advanced disease in the left lung with a left upper lobe lung nodule in addition to left hilar adenopathy and solitary left frontal brain metastasis s/p resection of the brain tumor with SRS in July 2022.  This was diagnosed in July 2022. Her molecular studies show she is positive for EGFR mutation L861Q.  The patient is currently undergoing targeted treatment with Tagrisso 80 mg p.o. daily.  She started this on 05/27/21.  Due to uncontrollable diarrhea, the patient's treatment was on hold starting on 07/09/21. She was supposed to resume this on 07/16/2021 at reduced dose 40 mg p.o daily. She has been tolerating reduced dose well without any adverse side effects.    The patient was seen with Dr. Julien Nordmann. Labs are reviewed.   The patient recently had a restaging CT scan performed. Dr. Julien Nordmann personally and independently reviewed the scan and discussed the results with the patient. The scan was stable except it noted further enlargement in the liver mass.   Dr. Julien Nordmann recommends re-referring the patient to Dr. Tammi Klippel for consideration of SBRT to this lesion. Dr. Julien Nordmann would like to keep  her on systemic treatment with Tagrisso at 40 mg p.o. daily at this time. However, he discussed if she continues to have other sites of disease progression on future imaging studies, he likely would need to change her treatment to chemotherapy.   I have placed a referral to radiation oncology.   She will proceed with the brain MRI on 10/08/21 as scheduled and see Dr. Mickeal Skinner early next week to review the results.   We will see her back for a follow up visit in 4 weeks for evaluation and repeat labs.   She will keep her appointment with her eye doctor on Thursday 10/07/21 as scheduled.   The patient was advised to call immediately if she has any concerning symptoms in the interval. The patient voices understanding of current disease status and treatment options and is in agreement with the current care plan. All questions were answered. The patient knows to call the clinic with any problems, questions or concerns. We can certainly see the patient much sooner if necessary  Orders Placed This Encounter  Procedures   CBC with Differential ([facility] Only)    Standing Status:   Future    Standing Expiration Date:   10/04/2022   CMP ([facility] only)    Standing Status:   Future    Standing Expiration Date:   10/04/2022   Ambulatory referral to Radiation Oncology    Referral Priority:   Routine    Referral Type:   Consultation    Referral Reason:   Specialty Services Required    Requested Specialty:   Radiation Oncology    Number of Visits Requested:   [place], PA-C 10/04/21  ADDENDUM: Hematology/Oncology Attending: I had a face-to-face encounter with the patient today.  I reviewed her record, lab, scan and recommended her care plan.  This is a very pleasant 76 years old white female diagnosed with a stage IV (T1c, N1, M1 C) non-small cell lung cancer, adenocarcinoma with positive EGFR mutation in exon 21 ([id number]) presented with locally advanced  disease in the left lung as well as left upper lobe nodule and left hilar adenopathy.  She also has solitary brain metastasis and solitary liver metastasis.  The patient underwent SRS to the brain metastasis. The patient started treatment with targeted therapy with Tagrisso initially at a dose of 80 mg p.o. daily but this was reduced to 40 mg p.o. daily secondary to intolerable diarrhea that is not responding to treatment.  She has been tolerating the reduced dose of Tagrisso fairly well. The patient had repeat CT scan of the chest, abdomen pelvis performed recently.  Her scan showed no concerning findings for disease progression except for the enlarging solitary liver metastasis. I had a lengthy discussion with the patient and her daughter today about her current condition and treatment options. I recommended for the patient to see Dr. Tammi Klippel for evaluation and discussion of possible SBRT to the progressive solitary liver metastasis. We will continue her current treatment with Tagrisso with the same dose for now. The patient will come back for follow-up visit in 1 months for evaluation and repeat blood work. If she started evolving multifocal disease progression, will consider discontinuing her treatment with Tagrisso and looking for any resistant actionable mutations or switching her to systemic chemotherapy. She was advised to call immediately if she has any other concerning symptoms in the interval. The total time spent in the appointment was 35 minutes.  Eilleen Kempf, MD 10/04/21

## 2021-10-04 ENCOUNTER — Inpatient Hospital Stay: Payer: Medicare HMO

## 2021-10-04 ENCOUNTER — Telehealth: Payer: Self-pay | Admitting: Radiation Oncology

## 2021-10-04 ENCOUNTER — Other Ambulatory Visit: Payer: Self-pay

## 2021-10-04 ENCOUNTER — Inpatient Hospital Stay: Payer: Medicare HMO | Attending: Internal Medicine | Admitting: Physician Assistant

## 2021-10-04 VITALS — BP 120/76 | HR 84 | Temp 97.2°F | Resp 17 | Wt 163.1 lb

## 2021-10-04 DIAGNOSIS — I10 Essential (primary) hypertension: Secondary | ICD-10-CM | POA: Diagnosis not present

## 2021-10-04 DIAGNOSIS — Z79899 Other long term (current) drug therapy: Secondary | ICD-10-CM | POA: Insufficient documentation

## 2021-10-04 DIAGNOSIS — C787 Secondary malignant neoplasm of liver and intrahepatic bile duct: Secondary | ICD-10-CM | POA: Diagnosis not present

## 2021-10-04 DIAGNOSIS — C3412 Malignant neoplasm of upper lobe, left bronchus or lung: Secondary | ICD-10-CM | POA: Diagnosis not present

## 2021-10-04 DIAGNOSIS — Z87891 Personal history of nicotine dependence: Secondary | ICD-10-CM | POA: Diagnosis not present

## 2021-10-04 DIAGNOSIS — C3490 Malignant neoplasm of unspecified part of unspecified bronchus or lung: Secondary | ICD-10-CM

## 2021-10-04 DIAGNOSIS — C7931 Secondary malignant neoplasm of brain: Secondary | ICD-10-CM | POA: Insufficient documentation

## 2021-10-04 LAB — CMP (CANCER CENTER ONLY)
ALT: 27 U/L (ref 0–44)
AST: 21 U/L (ref 15–41)
Albumin: 4 g/dL (ref 3.5–5.0)
Alkaline Phosphatase: 47 U/L (ref 38–126)
Anion gap: 7 (ref 5–15)
BUN: 24 mg/dL — ABNORMAL HIGH (ref 8–23)
CO2: 27 mmol/L (ref 22–32)
Calcium: 9.5 mg/dL (ref 8.9–10.3)
Chloride: 106 mmol/L (ref 98–111)
Creatinine: 1.46 mg/dL — ABNORMAL HIGH (ref 0.44–1.00)
GFR, Estimated: 37 mL/min — ABNORMAL LOW (ref >60)
Glucose, Bld: 75 mg/dL (ref 70–99)
Potassium: 3.7 mmol/L (ref 3.5–5.1)
Sodium: 140 mmol/L (ref 135–145)
Total Bilirubin: 0.4 mg/dL (ref 0.3–1.2)
Total Protein: 6.8 g/dL (ref 6.5–8.1)

## 2021-10-04 LAB — CBC WITH DIFFERENTIAL (CANCER CENTER ONLY)
Abs Immature Granulocytes: 0.04 10*3/uL (ref 0.00–0.07)
Basophils Absolute: 0 10*3/uL (ref 0.0–0.1)
Basophils Relative: 1 %
Eosinophils Absolute: 0.1 10*3/uL (ref 0.0–0.5)
Eosinophils Relative: 1 %
HCT: 39.1 % (ref 36.0–46.0)
Hemoglobin: 12.3 g/dL (ref 12.0–15.0)
Immature Granulocytes: 1 %
Lymphocytes Relative: 29 %
Lymphs Abs: 1.9 10*3/uL (ref 0.7–4.0)
MCH: 29.6 pg (ref 26.0–34.0)
MCHC: 31.5 g/dL (ref 30.0–36.0)
MCV: 94.2 fL (ref 80.0–100.0)
Monocytes Absolute: 0.6 10*3/uL (ref 0.1–1.0)
Monocytes Relative: 9 %
Neutro Abs: 3.9 10*3/uL (ref 1.7–7.7)
Neutrophils Relative %: 59 %
Platelet Count: 163 10*3/uL (ref 150–400)
RBC: 4.15 MIL/uL (ref 3.87–5.11)
RDW: 13.1 % (ref 11.5–15.5)
WBC Count: 6.5 10*3/uL (ref 4.0–10.5)
nRBC: 0 % (ref 0.0–0.2)

## 2021-10-05 ENCOUNTER — Other Ambulatory Visit: Payer: Self-pay | Admitting: Radiation Therapy

## 2021-10-05 ENCOUNTER — Telehealth: Payer: Self-pay | Admitting: Internal Medicine

## 2021-10-05 ENCOUNTER — Telehealth: Payer: Self-pay | Admitting: Radiation Oncology

## 2021-10-05 NOTE — Telephone Encounter (Signed)
Sch per 1/23 inbasket, left msg with daughter

## 2021-10-06 ENCOUNTER — Encounter: Payer: Self-pay | Admitting: Internal Medicine

## 2021-10-07 DIAGNOSIS — H353132 Nonexudative age-related macular degeneration, bilateral, intermediate dry stage: Secondary | ICD-10-CM | POA: Diagnosis not present

## 2021-10-07 DIAGNOSIS — Z9842 Cataract extraction status, left eye: Secondary | ICD-10-CM | POA: Diagnosis not present

## 2021-10-07 DIAGNOSIS — H52223 Regular astigmatism, bilateral: Secondary | ICD-10-CM | POA: Diagnosis not present

## 2021-10-07 DIAGNOSIS — Z9841 Cataract extraction status, right eye: Secondary | ICD-10-CM | POA: Diagnosis not present

## 2021-10-08 ENCOUNTER — Other Ambulatory Visit: Payer: Self-pay

## 2021-10-08 ENCOUNTER — Ambulatory Visit
Admission: RE | Admit: 2021-10-08 | Discharge: 2021-10-08 | Disposition: A | Discharge status: Home / Self Care | Payer: Medicare HMO | Source: Ambulatory Visit | Attending: Internal Medicine | Admitting: Internal Medicine

## 2021-10-08 DIAGNOSIS — C7931 Secondary malignant neoplasm of brain: Secondary | ICD-10-CM | POA: Diagnosis not present

## 2021-10-08 DIAGNOSIS — G9389 Other specified disorders of brain: Secondary | ICD-10-CM | POA: Diagnosis not present

## 2021-10-08 DIAGNOSIS — R519 Headache, unspecified: Secondary | ICD-10-CM | POA: Diagnosis not present

## 2021-10-08 IMAGING — MR MR HEAD WO/W CM
9 of 16 series · 30 of 48 positions shown · IV contrast (7ml Gadavist)
Comparison: [DATE]

CLINICAL DATA: Metastatic lung cancer.  Headaches.

EXAM:
MRI HEAD WITHOUT AND WITH CONTRAST
TECHNIQUE: Multiplanar, multiecho pulse sequences of the brain and surrounding
structures were obtained without and with intravenous contrast.
CONTRAST:  7mL GADAVIST GADOBUTROL 1 MMOL/ML IV SOLN

[Series 9: T2 · axial · 5.0mm · 0.53mm/px · 1 of 25 slices shown (1 of 4)]
[im 1/25]
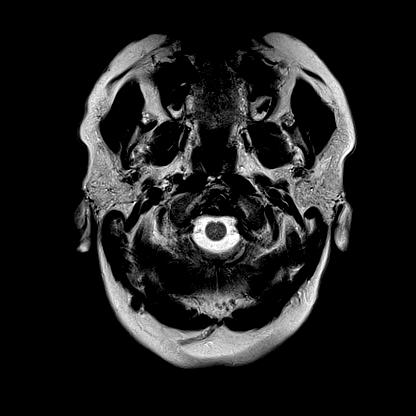

[Series 14: FLAIR · axial · 3.0mm · 0.53mm/px · z∈[-110,+52]mm · 2 of 55 slices shown]
[im 1/55]
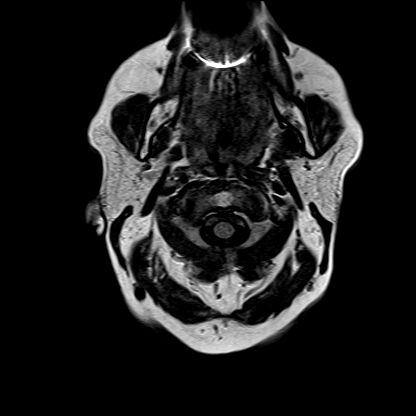
[im 55/55]
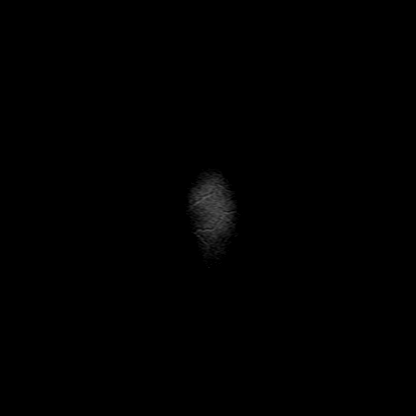

[Series 15: T2 · sagittal · non-contrast · 1.0mm · 0.98mm/px · 7 of 176 slices shown (2 of 4)]
[im 1/176]
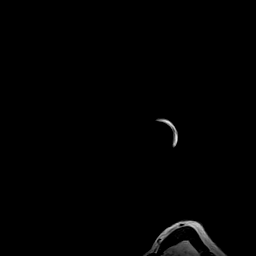
[im 30/176]
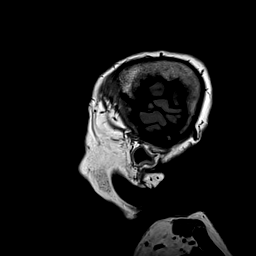
[im 59/176]
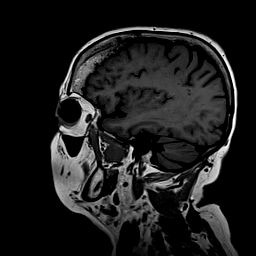
[im 88/176]
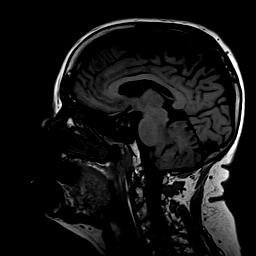
[im 117/176]
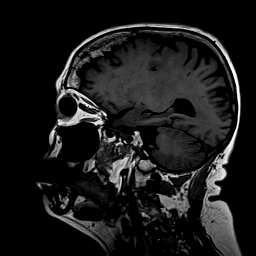
[im 146/176]
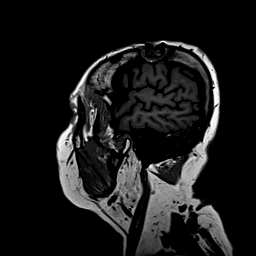
[im 176/176]
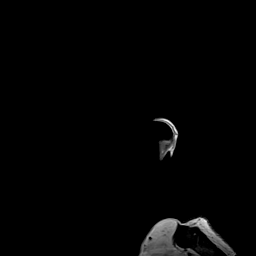

[Series 16: T2 post-contrast · coronal · 3.0mm · 0.57mm/px · 2 of 43 slices shown (1 of 2)]
[im 1/43]
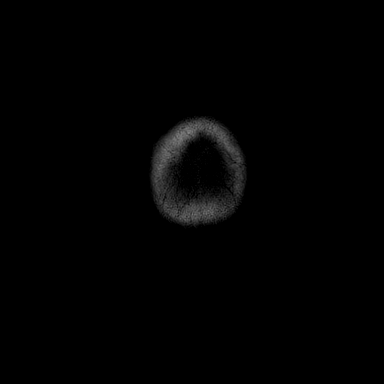
[im 43/43]
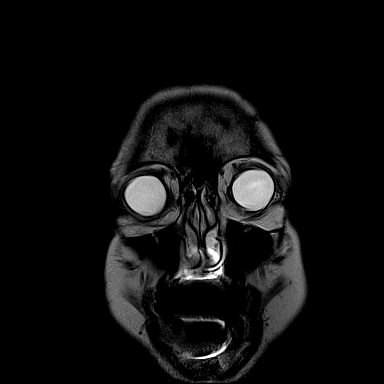

[Series 17: T2 post-contrast · sagittal · 1.0mm · 0.98mm/px · 7 of 176 slices shown (2 of 2)]
[im 1/176]
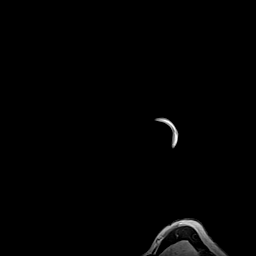
[im 30/176]
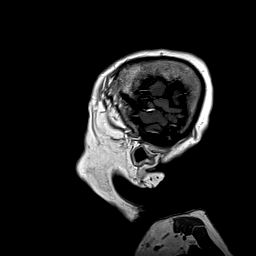
[im 59/176]
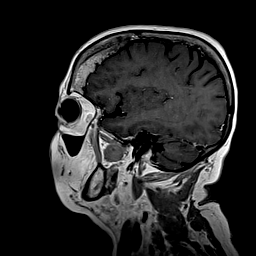
[im 88/176]
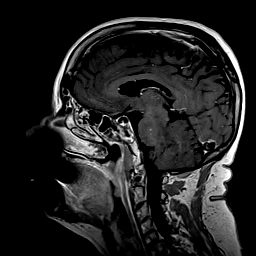
[im 117/176]
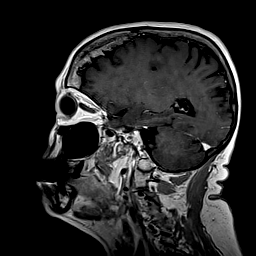
[im 146/176]
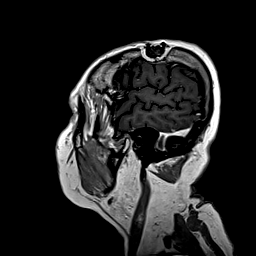
[im 176/176]
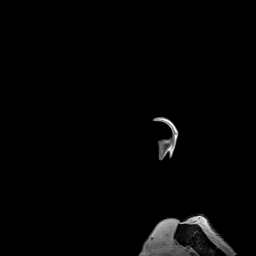

[Series 18: T1 post-contrast · coronal · 3.0mm · 0.29mm/px · 2 of 43 slices shown (1 of 2)]
[im 1/43]
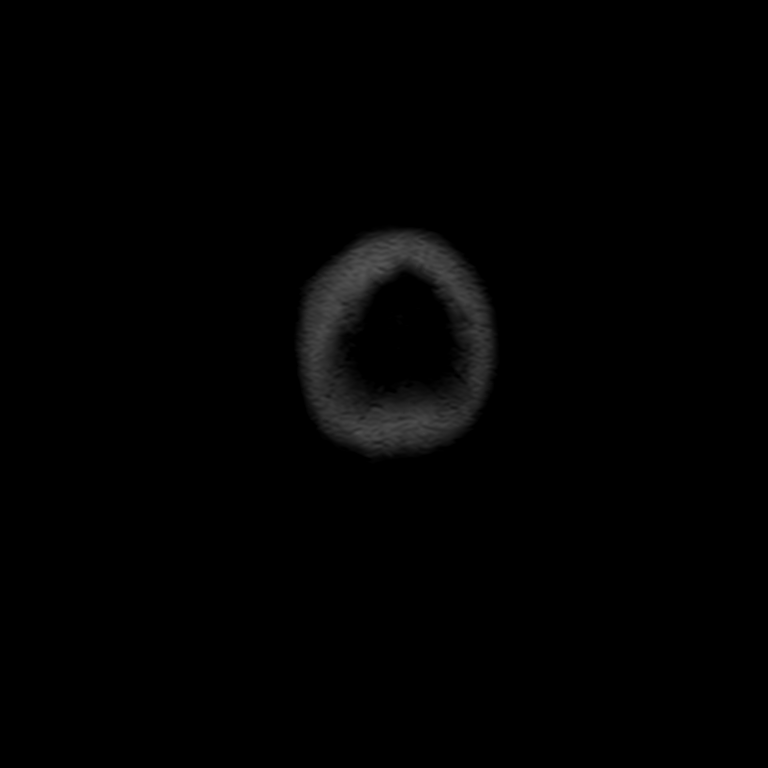
[im 43/43]
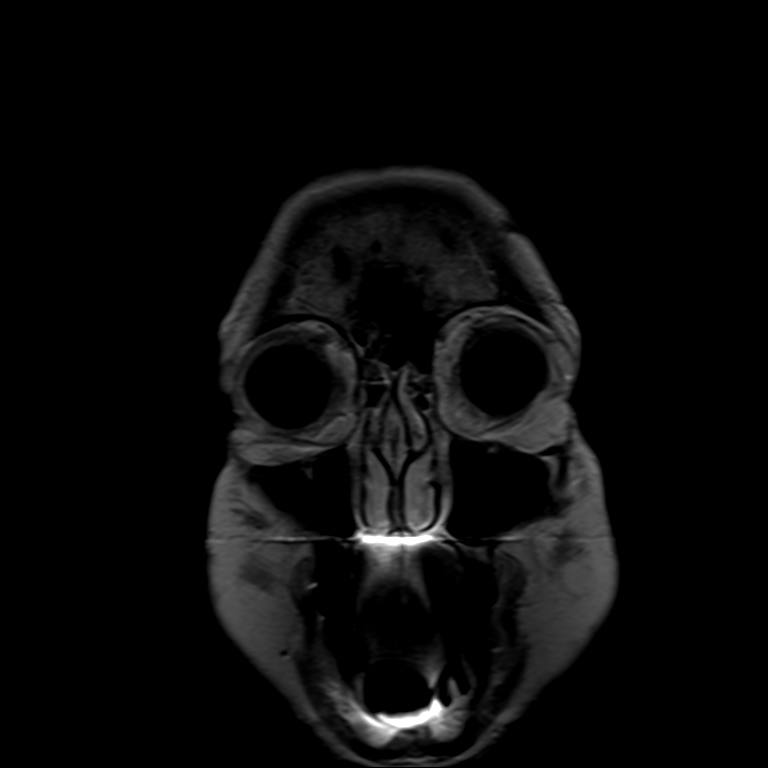

[Series 19: T1 post-contrast · sagittal · 3.0mm · 0.38mm/px · 1 of 35 slices shown (2 of 2)]
[im 1/35]
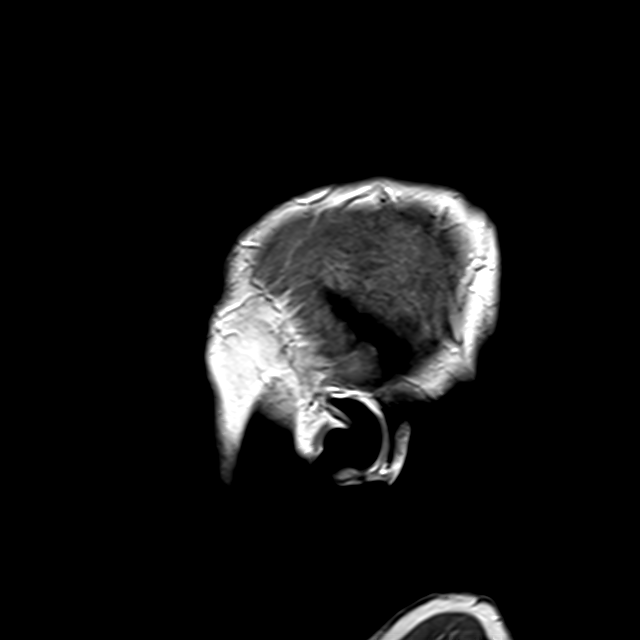

[Series 1034: T2 · axial · 1.0mm · 0.49mm/px · z∈[-103,+73]mm · 7 of 181 slices shown (3 of 4)]
[im 1/181]
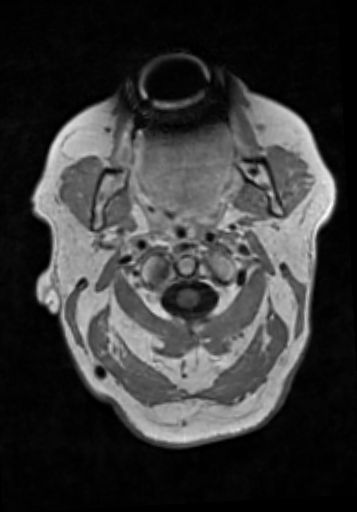
[im 31/181]
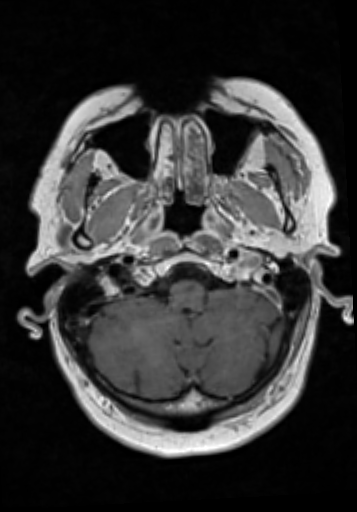
[im 61/181]
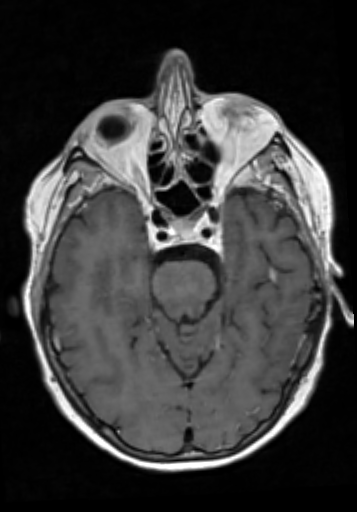
[im 91/181]
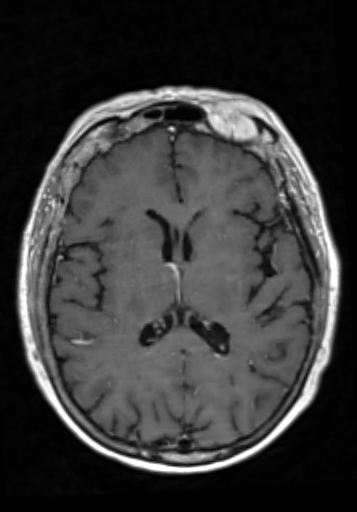
[im 121/181]
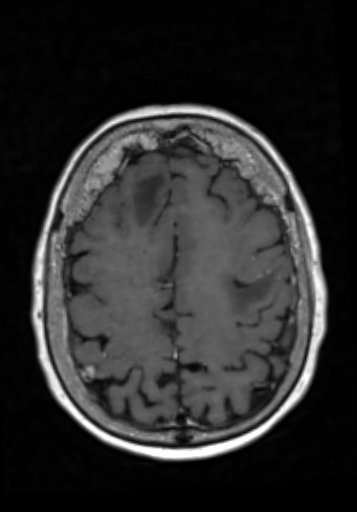
[im 151/181]
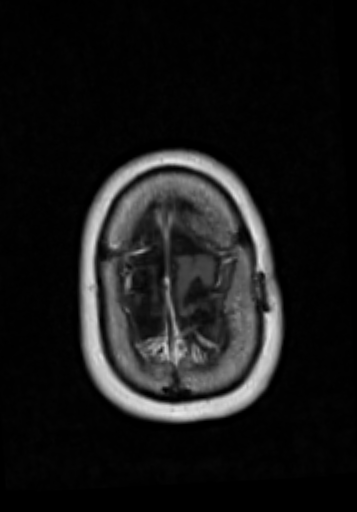
[im 181/181]
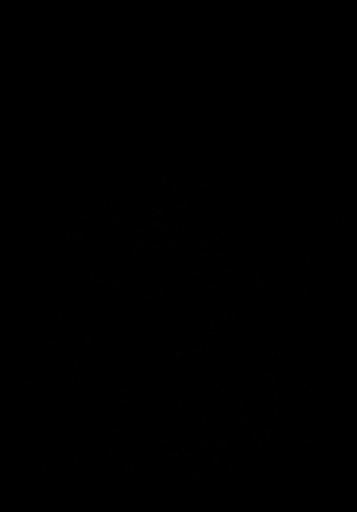

[Series 1040: T2 · axial · 1.0mm · 0.49mm/px · 1 of 181 slices shown (4 of 4)]
[im 1/181]
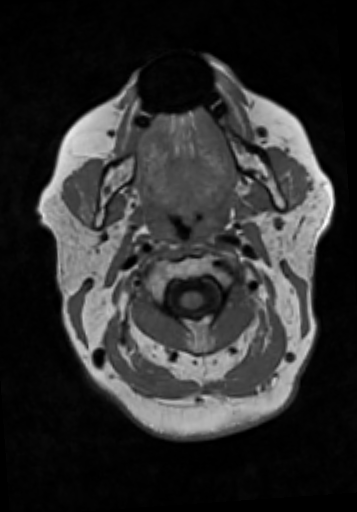

[30 of 48 positions shown; findings below may reference images not displayed]

FINDINGS: Brain:

New lesions:

1. 6 mm ring-enhancing lesion anteriorly in the right cerebellum
(series [3I], image 36).
2. 5 mm enhancing lesion in the right parietal lobe (series [3I],
image 120).
3. 12 mm enhancing lesion in the right frontal lobe (series [3I],
image 128).
4. 4 mm enhancing lesion in the left occipital lobe (series [3I],
image 65).
5. 1 mm enhancing lesion in the superior aspect of the left
occipital lobe (series [3I], image 83).
6. 5 mm enhancing lesion in the left precentral gyrus (series [3I],
image 135).

Larger lesions: None.

Stable or smaller lesions: Decreased enhancement and edema the left
frontal resection site.

Other brain findings: There is mild edema associated with some of
the new brain metastases, most notably the 12 mm right frontal
metastasis. There is no associated mass effect. No acute infarct,
midline shift, or extra-axial fluid collection is evident. The
ventricles are normal in size.

Vascular: Major intracranial vascular flow voids are preserved.

Skull and upper cervical spine: Unremarkable bone marrow signal.

Sinuses/Orbits: Persistent prominent mucosal thickening in the right
sphenoid sinus. Clear mastoid air cells. Bilateral cataract
extraction.

Other: None.
IMPRESSION: 1. 6 new brain metastases with up to mild edema.
2. Decreased enhancement and edema at the left frontal resection
site.

## 2021-10-08 MED ORDER — GADOBUTROL 1 MMOL/ML IV SOLN
7.0000 mL | Freq: Once | INTRAVENOUS | Status: AC | PRN
  Administered 2021-10-08: 7 mL via INTRAVENOUS

## 2021-10-11 ENCOUNTER — Other Ambulatory Visit: Payer: Self-pay

## 2021-10-11 ENCOUNTER — Inpatient Hospital Stay: Payer: Medicare HMO | Admitting: Internal Medicine

## 2021-10-11 ENCOUNTER — Inpatient Hospital Stay: Payer: Medicare HMO

## 2021-10-11 VITALS — BP 127/72 | HR 84 | Temp 97.7°F | Resp 16 | Wt 164.3 lb

## 2021-10-11 DIAGNOSIS — C7931 Secondary malignant neoplasm of brain: Secondary | ICD-10-CM

## 2021-10-11 DIAGNOSIS — R569 Unspecified convulsions: Secondary | ICD-10-CM

## 2021-10-11 DIAGNOSIS — I10 Essential (primary) hypertension: Secondary | ICD-10-CM | POA: Diagnosis not present

## 2021-10-11 DIAGNOSIS — Z87891 Personal history of nicotine dependence: Secondary | ICD-10-CM | POA: Diagnosis not present

## 2021-10-11 DIAGNOSIS — C3412 Malignant neoplasm of upper lobe, left bronchus or lung: Secondary | ICD-10-CM | POA: Diagnosis not present

## 2021-10-11 DIAGNOSIS — C787 Secondary malignant neoplasm of liver and intrahepatic bile duct: Secondary | ICD-10-CM | POA: Diagnosis not present

## 2021-10-11 DIAGNOSIS — Z79899 Other long term (current) drug therapy: Secondary | ICD-10-CM | POA: Diagnosis not present

## 2021-10-11 NOTE — Progress Notes (Signed)
[facility] [place] [address], [place] [address] [phone]   Interval Evaluation  Date of Service: 10/11/21 Patient Name: Marie Hensley Patient MRN: [id number] Patient DOB: [birth year] Provider: Ventura Sellers, MD  Identifying Statement:  Marie Hensley is a 76 y.o. female with Brain metastasis ([place])  Focal seizure ([place])   Primary Cancer:  Oncology History  Primary adenocarcinoma of left upper lobe of lung ([place])  02/23/2021 Initial Diagnosis   Primary adenocarcinoma of left upper lobe of lung ([place])   05/06/2021 -  Chemotherapy    Patient is on Treatment Plan: LUNG NSCLC OSIMERTINIB Q28D       05/10/2021 - 05/10/2021 Chemotherapy            CNS Oncologic History: 03/04/21: Presents with seizure, MRI demonstrates likely L frontal metastasis 04/06/21: L frontal SRS Tammi Klippel)  Interval History: Marie Hensley presents for follow up today following recent MRI brain.  Fortunately no further seizures, on Keppra 1000mg  twice per day.  Continues on [place] with Dr. Julien Nordmann, but has been referred to radiation oncology for radiation to progression of liver metastasis.  Speech output is still impaired but unchanged from prior.  Her right side strength remains intact. Continues on 1mg  daily decadron.  H+P (03/09/21) Patient presented to medical attention with sudden onset loss of speech output.  This was noticed by family on 03/04/21, she had been in normal state of health at home immediately prior.  She returned to baseline after several hours and ED visit, CNS imaging demonstrated an enhancing mass in the left frontal lobe.  She was started on Keppra 500mg  twice per day, no recurrence of seizure activity since that time.  She does acknowledge "some difficulty" with getting words out at times.  Has known lung mass, underwent bronchoscopy x2 without formal diagnostic result.  Is considering IR biopsy next month given very high  likelihood of primary lung malignancy.     Medications: Current Outpatient Medications on File Prior to Visit  Medication Sig Dispense Refill   acetaminophen (TYLENOL) 500 MG tablet Take 1,000 mg by mouth every 6 (six) hours as needed for mild pain (or arthritis).     atorvastatin (LIPITOR) 40 MG tablet Take 1 tablet (40 mg total) by mouth at bedtime. 90 tablet 3   cetirizine (ZYRTEC) 10 MG tablet Take 10 mg by mouth daily as needed for allergies or rhinitis.     citalopram (CELEXA) 20 MG tablet Take 1 tablet (20 mg total) by mouth at bedtime. For depression 90 tablet 0   dexamethasone (DECADRON) 1 MG tablet Take 1 tablet (1 mg total) by mouth daily. 30 tablet 3   diphenoxylate-atropine (LOMOTIL) 2.5-0.025 MG tablet Take 1 tablet by mouth 4 (four) times daily as needed for diarrhea or loose stools. 30 tablet 0   fluticasone (FLONASE) 50 MCG/ACT nasal spray Place 1 spray into both nostrils daily as needed for allergies.     HYDROcodone-acetaminophen (NORCO/VICODIN) 5-325 MG tablet Take 1 tablet by mouth every 4 (four) hours as needed for moderate pain. 30 tablet 0   levETIRAcetam (KEPPRA) 500 MG tablet TAKE TWO TABLETS BY MOUTH TWICE A DAY 120 tablet 3   lisinopril-hydrochlorothiazide (ZESTORETIC) 10-12.5 MG tablet Take 1 tablet by mouth daily. 90 tablet 3   loperamide (IMODIUM A-D) 2 MG tablet Take 2 mg by mouth 2 (two) times daily as needed for diarrhea or loose stools.     osimertinib mesylate (TAGRISSO) 40 MG tablet  Take 1 tablet (40 mg total) by mouth daily. 30 tablet 2   prochlorperazine (COMPAZINE) 10 MG tablet Take 1 tablet (10 mg total) by mouth every 6 (six) hours as needed for nausea or vomiting. 30 tablet 0   propranolol ER (INDERAL LA) 60 MG 24 hr capsule Take 1 capsule (60 mg total) by mouth at bedtime. 90 capsule 3   No current facility-administered medications on file prior to visit.    Allergies: No Known Allergies Past Medical History:  Past Medical History:  Diagnosis Date    Arthritis    Brain tumor ([facility])    Depression    Dyspnea    WITH EXERTION-SEEING DR [name]   HTN (hypertension)    Hyperlipemia    Seizures (HCC)    Past Surgical History:  Past Surgical History:  Procedure Laterality Date   APPENDECTOMY  [id number]   APPLICATION OF CRANIAL NAVIGATION N/A 04/08/2021   Procedure: APPLICATION OF CRANIAL NAVIGATION;  Surgeon: Judith Part, MD;  Location: [facility];  Service: Neurosurgery;  Laterality: N/A;  RM 21   CRANIOTOMY Left 04/08/2021   Procedure: Left Craniotomy for tumor resection with brainlab;  Surgeon: Judith Part, MD;  Location: [facility];  Service: Neurosurgery;  Laterality: Left;   EYE SURGERY Bilateral    CATARACTS   OOPHORECTOMY Right    VIDEO BRONCHOSCOPY WITH ENDOBRONCHIAL NAVIGATION Left 12/23/2020   Procedure: VIDEO BRONCHOSCOPY WITH ENDOBRONCHIAL NAVIGATION;  Surgeon: Tyler Pita, MD;  Location: [facility];  Service: Pulmonary;  Laterality: Left;   VIDEO BRONCHOSCOPY WITH ENDOBRONCHIAL NAVIGATION Left 02/01/2021   Procedure: ROBOTIC ASSISTED VIDEO BRONCHOSCOPY WITH ENDOBRONCHIAL NAVIGATION;  Surgeon: Tyler Pita, MD;  Location: [facility];  Service: Pulmonary;  Laterality: Left;   VIDEO BRONCHOSCOPY WITH ENDOBRONCHIAL ULTRASOUND Left 12/23/2020   Procedure: VIDEO BRONCHOSCOPY WITH ENDOBRONCHIAL ULTRASOUND;  Surgeon: Tyler Pita, MD;  Location: [facility];  Service: Pulmonary;  Laterality: Left;   VIDEO BRONCHOSCOPY WITH ENDOBRONCHIAL ULTRASOUND Left 02/01/2021   Procedure: ROBOTIC ASSITSTED VIDEO BRONCHOSCOPY WITH ENDOBRONCHIAL ULTRASOUND;  Surgeon: Tyler Pita, MD;  Location: [facility];  Service: Pulmonary;  Laterality: Left;   Social History:  Social History   Socioeconomic History   Marital status: Single    Spouse name: Not on file   Number of children: 3   Years of education: high school    Highest education level: Not on file  Occupational History   Occupation: retired  Tobacco Use   Smoking  status: Former    Packs/day: 0.50    Years: 20.00    Pack years: 10.00    Types: Cigarettes    Quit date: 10/27/1989    Years since quitting: 31.9   Smokeless tobacco: Never   Tobacco comments:    Smoked off and on in 20 year period  Vaping Use   Vaping Use: Never used  Substance and Sexual Activity   Alcohol use: Yes    Comment: 3-4 times a week, 2oz liquor    Drug use: Never   Sexual activity: Not Currently    Birth control/protection: Post-menopausal  Other Topics Concern   Not on file  Social History Narrative   She has discussed advanced directives with her.  She would want CPR and intubation if chance of recovery.   11/19/20   From: [place], moved for work 1998   Living: alone   Work: retired Energy manager of [employer]: 3 children - [name], Tomasa Blase - 5  grandchildren, 1 great-grandchild      Enjoys: shop, casino, cruise travel      Exercise: not as much due to the fatigue   Diet: tries to eat healthy, but eats what she wants      Safety   Seat belts: Yes    Guns: No   Safe in relationships: Yes    Social Determinants of Health   Financial Resource Strain: Not on file  Food Insecurity: Not on file  Transportation Needs: Not on file  Physical Activity: Not on file  Stress: Not on file  Social Connections: Not on file  Intimate Partner Violence: Not on file   Family History:  Family History  Problem Relation Age of Onset   Addison's disease Daughter    Hypertension Mother        died at 47   Alcohol abuse Father     Review of Systems: Constitutional: Doesn't report fevers, chills or abnormal weight loss Eyes: Doesn't report blurriness of vision Ears, nose, mouth, throat, and face: Doesn't report sore throat Respiratory: Doesn't report cough, dyspnea or wheezes Cardiovascular: Doesn't report palpitation, chest discomfort  Gastrointestinal:  Doesn't report nausea, constipation, diarrhea GU: Doesn't report incontinence Skin: Doesn't report  skin rashes Neurological: Per HPI Musculoskeletal: Doesn't report joint pain Behavioral/Psych: Doesn't report anxiety  Physical Exam: Wt Readings from Last 3 Encounters:  10/11/21 164 lb 5 oz (74.5 kg)  10/04/21 163 lb 1 oz (74 kg)  08/23/21 158 lb 9.6 oz (71.9 kg)   Temp Readings from Last 3 Encounters:  10/11/21 97.7 F (36.5 C) (Tympanic)  10/04/21 (!) 97.2 F (36.2 C) (Temporal)  08/23/21 (!) 97.3 F (36.3 C) (Tympanic)   BP Readings from Last 3 Encounters:  10/11/21 127/72  10/04/21 120/76  08/23/21 139/75   Pulse Readings from Last 3 Encounters:  10/11/21 84  10/04/21 84  08/23/21 78    KPS: 80. General: Alert, cooperative, pleasant, in no acute distress Head: Normal EENT: No conjunctival injection or scleral icterus.  Lungs: Resp effort normal Cardiac: Regular rate Abdomen: Non-distended abdomen Skin: No rashes cyanosis or petechiae. Extremities: No clubbing or edema  Neurologic Exam: Mental Status: Awake, alert, attentive to examiner. Oriented to self and environment. Mild expressive aphasia.  Cranial Nerves: Visual acuity is grossly normal. Visual fields are full. Extra-ocular movements intact. No ptosis. Face is symmetric Motor: Tone and bulk are normal. Power is full in both arms and legs. Reflexes are symmetric, no pathologic reflexes present.  Sensory: Intact to light touch Gait: Normal.   Labs: I have reviewed the data as listed    Component Value Date/Time   NA 140 10/04/2021 1113   K 3.7 10/04/2021 1113   CL 106 10/04/2021 1113   CO2 27 10/04/2021 1113   GLUCOSE 75 10/04/2021 1113   BUN 24 (H) 10/04/2021 1113   CREATININE 1.46 (H) 10/04/2021 1113   CALCIUM 9.5 10/04/2021 1113   PROT 6.8 10/04/2021 1113   ALBUMIN 4.0 10/04/2021 1113   AST 21 10/04/2021 1113   ALT 27 10/04/2021 1113   ALKPHOS 47 10/04/2021 1113   BILITOT 0.4 10/04/2021 1113   GFRNONAA 37 (L) 10/04/2021 1113   Lab Results  Component Value Date   WBC 6.5 10/04/2021    NEUTROABS 3.9 10/04/2021   HGB 12.3 10/04/2021   HCT 39.1 10/04/2021   MCV 94.2 10/04/2021   PLT 163 10/04/2021    Imaging:  [facility] Clinician Interpretation: I have personally reviewed the CNS images as listed.  My  interpretation, in the context of the patient's clinical presentation, is progressive disease  MR BRAIN W WO CONTRAST  Result Date: 10/08/2021 CLINICAL DATA:  Metastatic lung cancer.  Headaches. EXAM: MRI HEAD WITHOUT AND WITH CONTRAST TECHNIQUE: Multiplanar, multiecho pulse sequences of the brain and surrounding structures were obtained without and with intravenous contrast. CONTRAST:  43mL GADAVIST GADOBUTROL 1 MMOL/ML IV SOLN COMPARISON:  07/08/2021 FINDINGS: Brain: New lesions: 1. 6 mm ring-enhancing lesion anteriorly in the right cerebellum (series 1034, image 36). 2. 5 mm enhancing lesion in the right parietal lobe (series 1034, image 120). 3. 12 mm enhancing lesion in the right frontal lobe (series 1034, image 128). 4. 4 mm enhancing lesion in the left occipital lobe (series 1034, image 65). 5. 1 mm enhancing lesion in the superior aspect of the left occipital lobe (series 1034, image 83). 6. 5 mm enhancing lesion in the left precentral gyrus (series 1034, image 135). Larger lesions: None. Stable or smaller lesions: Decreased enhancement and edema the left frontal resection site. Other brain findings: There is mild edema associated with some of the new brain metastases, most notably the 12 mm right frontal metastasis. There is no associated mass effect. No acute infarct, midline shift, or extra-axial fluid collection is evident. The ventricles are normal in size. Vascular: Major intracranial vascular flow voids are preserved. Skull and upper cervical spine: Unremarkable bone marrow signal. Sinuses/Orbits: Persistent prominent mucosal thickening in the right sphenoid sinus. Clear mastoid air cells. Bilateral cataract extraction. Other: None. IMPRESSION: 1. 6 new brain metastases with up to  mild edema. 2. Decreased enhancement and edema at the left frontal resection site. Electronically Signed   By: Logan Bores M.D.   On: 10/08/2021 20:48   CT CHEST ABDOMEN PELVIS W CONTRAST  Result Date: 09/21/2021 CLINICAL DATA:  Non-small cell lung cancer restaging, metastatic to brain EXAM: CT CHEST, ABDOMEN, AND PELVIS WITH CONTRAST TECHNIQUE: Multidetector CT imaging of the chest, abdomen and pelvis was performed following the standard protocol during bolus administration of intravenous contrast. RADIATION DOSE REDUCTION: This exam was performed according to the departmental dose-optimization program which includes automated exposure control, adjustment of the mA and/or kV according to patient size and/or use of iterative reconstruction technique. CONTRAST:  70mL OMNIPAQUE IOHEXOL 300 MG/ML SOLN, additional oral enteric contrast COMPARISON:  MR abdomen, 08/12/2021, CT chest abdomen pelvis, 07/29/2021 FINDINGS: CT CHEST FINDINGS Cardiovascular: Aortic atherosclerosis. Normal heart size. No pericardial effusion. Mediastinum/Nodes: No enlarged mediastinal, hilar, or axillary lymph nodes. Thyroid gland, trachea, and esophagus demonstrate no significant findings. Lungs/Pleura: Unchanged, spiculated mass of the posterior left upper lobe measuring 1.6 x 1.5 cm (series 3, image 52). Bandlike scarring the bilateral lung bases. No pleural effusion or pneumothorax. Musculoskeletal: No chest wall mass or suspicious osseous lesions identified. CT ABDOMEN PELVIS FINDINGS Hepatobiliary: Significant interval enlargement of a hypoenhancing mass of the anterior right lobe of the liver, hepatic segment V, measuring 2.2 x 2.2 cm, previously 1.8 x 1.4 cm by MR (series 2, image 61). No gallstones, gallbladder wall thickening, or biliary dilatation. Pancreas: Unremarkable. No pancreatic ductal dilatation or surrounding inflammatory changes. Spleen: Normal in size without significant abnormality. Adrenals/Urinary Tract: Adrenal  glands are unremarkable. Kidneys are normal, without renal calculi, solid lesion, or hydronephrosis. Bladder is unremarkable. Stomach/Bowel: Stomach is within normal limits. Appendix is surgically absent. No evidence of bowel wall thickening, distention, or inflammatory changes. Descending and sigmoid diverticulosis. Vascular/Lymphatic: Aortic atherosclerosis. No enlarged abdominal or pelvic lymph nodes. Reproductive: No mass or other abnormality. Other: No  abdominal wall hernia or abnormality. No ascites. Musculoskeletal: No acute osseous findings. IMPRESSION: 1. Unchanged, spiculated mass of the posterior left upper lobe measuring 1.6 x 1.5 cm. 2. Significant interval enlargement of a hypoenhancing mass of the anterior right lobe of the liver, consistent with an enlarging hepatic metastasis. 3. Diverticulosis without evidence of acute diverticulitis. Aortic Atherosclerosis (ICD10-I70.0). Electronically Signed   By: Delanna Ahmadi M.D.   On: 09/21/2021 16:38    Assessment/Plan Brain metastasis ([place])  Focal seizure ([place])  Marie Hensley is clinically stable today.  Brain MRI unfortunately demonstrates progression of disease, with 6 new lesions identified.    We discussed recommendation to treat with salvage radiosurgery, she is agreeable with this plan.  We will communicate consent to rad-onc team.  We recommended continuing Keppra 1000mg  BID, ok to con't decadron 1mg  daily as prior.  She will continue Tagrisso for now with lung team.  We ask that [name] return to clinic in 3 months following post-SRS brain MRI, or sooner as needed.  All questions were answered. The patient knows to call the clinic with any problems, questions or concerns. No barriers to learning were detected.  The total time spent in the encounter was 30 minutes and more than 50% was on counseling and review of test results   Ventura Sellers, MD Medical Director of Neuro-Oncology [facility] at  [place] 10/11/21 12:06 PM

## 2021-10-12 ENCOUNTER — Other Ambulatory Visit: Payer: Self-pay | Admitting: Urology

## 2021-10-12 ENCOUNTER — Encounter: Payer: Self-pay | Admitting: Radiation Oncology

## 2021-10-12 ENCOUNTER — Encounter: Payer: Self-pay | Admitting: Urology

## 2021-10-12 DIAGNOSIS — C787 Secondary malignant neoplasm of liver and intrahepatic bile duct: Secondary | ICD-10-CM | POA: Insufficient documentation

## 2021-10-12 DIAGNOSIS — C3490 Malignant neoplasm of unspecified part of unspecified bronchus or lung: Secondary | ICD-10-CM

## 2021-10-12 NOTE — Progress Notes (Signed)
Spoke w/ patient, verified identity, and begin nursing interview. Patient states "She's doing great. No symptoms reported at  this time."  Meaningful use complete. Postmenopausal-No chances of pregnancy.  Patient notified of her 11:30am-10/13/21 telephone appointment w/ Ashlyn Bruning PA-C. I left my extension [phone] in case patient needs to call. Patient verbalized understanding of information.  Patient contact # [phone]

## 2021-10-12 NOTE — Progress Notes (Incomplete)
A hypovascular mass measures 1.8 x 1.4 cm    Significant interval enlargement of a hypoenhancing mass of the anterior right lobe of the liver, hepatic segment V, measuring 2.2 x 2.2 cm     6 mm ring-enhancing lesion anteriorly in the right cerebellum (series 1034, image 36).   5 mm enhancing lesion in the right parietal lobe (series 1034, image 120).    12 mm enhancing lesion in the right frontal lobe (series 1034, image 128).     4 mm enhancing lesion in the left occipital lobe (series 1034, image 65).     1 mm enhancing lesion in the superior aspect of the left occipital lobe (series 1034, image 83).    5 mm enhancing lesion in the left precentral gyrus (series 1034, image 135).

## 2021-10-13 ENCOUNTER — Ambulatory Visit
Admission: RE | Admit: 2021-10-13 | Discharge: 2021-10-13 | Disposition: A | Discharge status: Home / Self Care | Payer: Medicare HMO | Source: Ambulatory Visit | Attending: Radiation Oncology | Admitting: Radiation Oncology

## 2021-10-13 ENCOUNTER — Encounter: Payer: Self-pay | Admitting: Urology

## 2021-10-13 ENCOUNTER — Telehealth: Payer: Self-pay | Admitting: Internal Medicine

## 2021-10-13 ENCOUNTER — Ambulatory Visit
Admission: RE | Admit: 2021-10-13 | Discharge: 2021-10-13 | Disposition: A | Discharge status: Home / Self Care | Payer: Medicare HMO | Source: Ambulatory Visit | Attending: Urology | Admitting: Urology

## 2021-10-13 DIAGNOSIS — C787 Secondary malignant neoplasm of liver and intrahepatic bile duct: Secondary | ICD-10-CM

## 2021-10-13 DIAGNOSIS — C7931 Secondary malignant neoplasm of brain: Secondary | ICD-10-CM | POA: Diagnosis not present

## 2021-10-13 DIAGNOSIS — C3492 Malignant neoplasm of unspecified part of left bronchus or lung: Secondary | ICD-10-CM | POA: Diagnosis not present

## 2021-10-13 NOTE — Telephone Encounter (Signed)
Scheduled per 01/23 los, patient has been called and voicemail was left.

## 2021-10-14 ENCOUNTER — Encounter (HOSPITAL_COMMUNITY): Payer: Self-pay

## 2021-10-14 ENCOUNTER — Ambulatory Visit: Payer: Medicare HMO

## 2021-10-14 ENCOUNTER — Ambulatory Visit: Payer: Medicare HMO | Admitting: Radiation Oncology

## 2021-10-14 ENCOUNTER — Ambulatory Visit
Admission: RE | Admit: 2021-10-14 | Discharge: 2021-10-14 | Disposition: A | Discharge status: Home / Self Care | Payer: Medicare HMO | Source: Ambulatory Visit | Attending: Radiation Oncology | Admitting: Radiation Oncology

## 2021-10-14 ENCOUNTER — Ambulatory Visit
Admission: RE | Admit: 2021-10-14 | Discharge: 2021-10-14 | Disposition: A | Discharge status: Home / Self Care | Payer: Medicare HMO | Source: Ambulatory Visit | Attending: Urology | Admitting: Urology

## 2021-10-14 ENCOUNTER — Other Ambulatory Visit: Payer: Self-pay

## 2021-10-14 ENCOUNTER — Other Ambulatory Visit: Payer: Self-pay | Admitting: Urology

## 2021-10-14 VITALS — BP 116/72 | HR 68 | Temp 97.6°F | Resp 20 | Ht 64.0 in | Wt 165.4 lb

## 2021-10-14 DIAGNOSIS — C787 Secondary malignant neoplasm of liver and intrahepatic bile duct: Secondary | ICD-10-CM

## 2021-10-14 DIAGNOSIS — C3490 Malignant neoplasm of unspecified part of unspecified bronchus or lung: Secondary | ICD-10-CM | POA: Diagnosis present

## 2021-10-14 DIAGNOSIS — C7931 Secondary malignant neoplasm of brain: Secondary | ICD-10-CM | POA: Insufficient documentation

## 2021-10-14 DIAGNOSIS — C3412 Malignant neoplasm of upper lobe, left bronchus or lung: Secondary | ICD-10-CM | POA: Insufficient documentation

## 2021-10-14 DIAGNOSIS — C3492 Malignant neoplasm of unspecified part of left bronchus or lung: Secondary | ICD-10-CM | POA: Diagnosis not present

## 2021-10-14 MED ORDER — SODIUM CHLORIDE 0.9% FLUSH
10.0000 mL | Freq: Once | INTRAVENOUS | Status: AC
  Administered 2021-10-14: 10 mL via INTRAVENOUS

## 2021-10-14 NOTE — Progress Notes (Signed)
Has armband been applied?  Yes.    Does patient have an allergy to IV contrast dye?: No.   Has patient ever received premedication for IV contrast dye?: No.   Does patient take metformin?: No.  If patient does take metformin when was the last dose: N/A  Date of lab work: October 04, 2021 BUN: 24 CR: 1.46  IV site: antecubital right, condition patent and no redness  Has IV site been added to flowsheet?  Yes.     BP 116/72 (BP Location: Right Arm, Patient Position: Sitting, Cuff Size: Normal)    Pulse 68    Temp 97.6 F (36.4 C) (Oral)    Resp 20    Ht 5\' 4"  (1.626 m)    Wt 165 lb 6.4 oz (75 kg)    SpO2 98%    BMI 28.39 kg/m

## 2021-10-14 NOTE — Progress Notes (Signed)
[name] Legal Sex  Female DOB  [birth year] SSN  [id number] Address  [address]  [place] [address] Phone  [phone] (Home) *Preferred*  [phone] (Mobile)    RE: CT BIOPSY Received: Today Arne Cleveland, MD  [place], Emin Foree M Ok   [name] core biopsy liver met   DDH        Previous Messages   ----- Message -----  From: Valli Glance  Sent: 10/13/2021   1:13 PM EST  To: Ir Procedure Requests  Subject: CT BIOPSY                                       CT BIOPSY        Reason:Non-small cell lung cancer metastatic to liver         History: CT and MRI  in computer          Provider: Freeman Caldron         [phone]

## 2021-10-14 NOTE — Progress Notes (Signed)
Radiation Oncology         [phone] ________________________________  Outpatient Re-Consultation- Conducted via telephone due to current COVID-19 concerns for limiting patient exposure  Name: Marie Hensley MRN: [id number]  Date of Service: 10/13/2021 DOB: 1946/05/11  WL:NLGX, Jobe Marker, MD  Curt Bears, MD   REFERRING PHYSICIAN: Curt Bears, MD  DIAGNOSIS: 76 y/o female with progressive brain metastasis and new, enlarging liver metastasis secondary to Stage IV (T1c, N1, M1c) non-small cell lung cancer, adenocarcinoma of the left lung.    ICD-10-CM   1. Non-small cell lung cancer metastatic to liver [facility])  C34.90    C78.7       HISTORY OF PRESENT ILLNESS: Marie Hensley is a 76 y.o. female seen at the request of Dr. Julien Nordmann.  She is well-known to our service, having deviously completed preop SRS prior to resection of a solitary brain met in July 2022.  She tolerated her treatment well but did have a breakthrough seizure on 05/09/2021, despite taking Keppra 500 mg p.o. twice daily. She was evaluated with Dr. Mickeal Skinner who increased her Keppra to 1000 mg p.o. twice daily and added Decadron 4 mg p.o. twice daily for 3 days and then daily thereafter. Her speech improved significantly with the Decadron and she has continued on the low-dose 1 mg daily. She has not had any further seizure activity since increasing the dose of Keppra and has continued making good progress with speech therapy and her strength on the right side remains stable.  She was started on targeted therapy with Tagrisso on 05/27/2021 but was unable to tolerate the full dose at 80 mg due to severe diarrhea and dehydration.  She was on the medication for a month or more with significant improvement in the diarrhea.  She was recently able to resume the Tagrisso at a reduced dose of 40 mg daily beginning and has tolerated the reduced dose well.  Her posttreatment MRI brain on 07/08/2021 appeared stable with  postoperative changes only and no new or progressive disease. Her CT C/A/P for disease restaging on 07/19/2021 showed no significant change in the appearance of the left upper lobe lung nodule but there was a new, indeterminate, low-attenuation structure within the periphery of the right hepatic lobe liver measuring 1.8 x 1.0 cm, suspicious for a new site of disease.  A liver MRI was performed on 08/12/2021 for further evaluation and demonstrated a 1.8 cm hypervascular mass in the anterior right hepatic lobe of the liver with mistakes that were nonspecific but still remains suspicious for possible early liver metastasis.  No other evidence of metastatic disease within the abdomen.  Her most recent disease restaging CT C/A/P on 09/29/2021 showed unchanged appearance of left posterior upper lobe lesion but significant enlargement of the mass in the anterior right lobe of the liver, now measuring 2.2 x 2.2 cm as compared to 1.8 x 1.4 cm previously.    Additionally, her most recent MRI brain scan also showed progressive disease with 6 new lesions with only mild associated edema. New lesions:   Her scans were reviewed in the recent multidisciplinary brain conference and consensus recommendation is to treat the new brain lesions with a single fraction of SRS.  She has kindly been referred back to [name] today to discuss the [facility] brain treatment as well as the potential role of radiation in the management of the enlarging liver lesion.  PREVIOUS RADIATION THERAPY: Yes  04/06/2021//Pre-Op SRS: PTV1: The solitary brain metastasis in the left frontal  lobe was treated to 20 Gray in a single fraction. Site Technique Total Dose (Gy) Dose per Fx (Gy) Completed Fx Beam Energies  Brain: Brain_SRS PTV_1 Lt Frontal 47m IMRT 20/20 20 1/1 6XFFF    PAST MEDICAL HISTORY:  Past Medical History:  Diagnosis Date   Arthritis    [place]rain tumor ([place]    Depression    Dyspnea    WITH EXERTION-SEEING DR GPatsey Berthold  HTN  (hypertension)    Hyperlipemia    [place]       PAST SURGICAL HISTORY: Past Surgical History:  Procedure Laterality Date   APPENDECTOMY  [id number]  APPLICATION OF CRANIAL NAVIGATION N/A 04/08/2021   Procedure: APPLICATION OF CRANIAL NAVIGATION;  Surgeon: OJudith Part MD;  [place]  Service: Neurosurgery;  Laterality: N/A;  RM 21   CRANIOTOMY Left 04/08/2021   Procedure: Left Craniotomy for tumor resection with brainlab;  Surgeon: OJudith Part MD;  [place]  Service: Neurosurgery;  Laterality: Left;   EYE SURGERY Bilateral    CATARACTS   OOPHORECTOMY Right    VIDEO BRONCHOSCOPY WITH ENDOBRONCHIAL NAVIGATION Left 12/23/2020   Procedure: VIDEO BRONCHOSCOPY WITH ENDOBRONCHIAL NAVIGATION;  Surgeon: GTyler Pita MD;  Location: [facility];  Service: Pulmonary;  Laterality: Left;   VIDEO BRONCHOSCOPY WITH ENDOBRONCHIAL NAVIGATION Left 02/01/2021   Procedure: ROBOTIC ASSISTED VIDEO BRONCHOSCOPY WITH ENDOBRONCHIAL NAVIGATION;  Surgeon: GTyler Pita MD;  Location: [facility];  Service: Pulmonary;  Laterality: Left;   VIDEO BRONCHOSCOPY WITH ENDOBRONCHIAL ULTRASOUND Left 12/23/2020   Procedure: VIDEO BRONCHOSCOPY WITH ENDOBRONCHIAL ULTRASOUND;  Surgeon: GTyler Pita MD;  Location: [facility];  Service: Pulmonary;  Laterality: Left;   VIDEO BRONCHOSCOPY WITH ENDOBRONCHIAL ULTRASOUND Left 02/01/2021   Procedure: ROBOTIC ASSITSTED VIDEO BRONCHOSCOPY WITH ENDOBRONCHIAL ULTRASOUND;  Surgeon: GTyler Pita MD;  Location: [facility];  Service: Pulmonary;  Laterality: Left;    FAMILY HISTORY:  Family History  Problem Relation Age of Onset   Addison's disease Daughter    Hypertension Mother        died at 127  Alcohol abuse Father     SOCIAL HISTORY:  Social History   Socioeconomic History   Marital status: Single    Spouse name: Not on file   Number of children: 3   Years of education: high school    Highest education level: Not on file   Occupational History   Occupation: retired  Tobacco Use   Smoking status: Former    Packs/day: 0.50    Years: 20.00    Pack years: 10.00    Types: Cigarettes    Quit date: 10/27/1989    Years since quitting: 31.9   Smokeless tobacco: Never   Tobacco comments:    Smoked off and on in 20 year period  Vaping Use   Vaping Use: Never used  Substance and Sexual Activity   Alcohol use: Yes    Comment: 3-4 times a week, 2oz liquor    Drug use: Never   Sexual activity: Not Currently    Birth control/protection: Post-menopausal  Other Topics Concern   Not on file  Social History Narrative   She has discussed advanced directives with her.  She would want CPR and intubation if chance of recovery.   11/19/20   From: [place], moved for work 1998   Living: alone   Work: retired -Energy managerof [employer] 3 children - [name] DTomasa Blase- 5 grandchildren,  1 great-grandchild      Enjoys: shop, casino, cruise travel      Exercise: not as much due to the fatigue   Diet: tries to eat healthy, but eats what she wants      Safety   Seat belts: Yes    Guns: No   Safe in relationships: Yes    Social Determinants of Radio broadcast assistant Strain: Not on file  Food Insecurity: Not on file  Transportation Needs: Not on file  Physical Activity: Not on file  Stress: Not on file  Social Connections: Not on file  Intimate Partner Violence: Not on file    ALLERGIES: Patient has no known allergies.  MEDICATIONS:  Current Outpatient Medications  Medication Sig Dispense Refill   acetaminophen (TYLENOL) 500 MG tablet Take 1,000 mg by mouth every 6 (six) hours as needed for mild pain (or arthritis).     atorvastatin (LIPITOR) 40 MG tablet Take 1 tablet (40 mg total) by mouth at bedtime. 90 tablet 3   cetirizine (ZYRTEC) 10 MG tablet Take 10 mg by mouth daily as needed for allergies or rhinitis.     citalopram (CELEXA) 20 MG tablet Take 1 tablet (20 mg total) by mouth at  bedtime. For depression 90 tablet 0   dexamethasone (DECADRON) 1 MG tablet Take 1 tablet (1 mg total) by mouth daily. 30 tablet 3   diphenoxylate-atropine (LOMOTIL) 2.5-0.025 MG tablet Take 1 tablet by mouth 4 (four) times daily as needed for diarrhea or loose stools. 30 tablet 0   fluticasone (FLONASE) 50 MCG/ACT nasal spray Place 1 spray into both nostrils daily as needed for allergies.     HYDROcodone-acetaminophen (NORCO/VICODIN) 5-325 MG tablet Take 1 tablet by mouth every 4 (four) hours as needed for moderate pain. 30 tablet 0   levETIRAcetam (KEPPRA) 500 MG tablet TAKE TWO TABLETS BY MOUTH TWICE A DAY 120 tablet 3   lisinopril-hydrochlorothiazide (ZESTORETIC) 10-12.5 MG tablet Take 1 tablet by mouth daily. 90 tablet 3   loperamide (IMODIUM A-D) 2 MG tablet Take 2 mg by mouth 2 (two) times daily as needed for diarrhea or loose stools.     osimertinib mesylate (TAGRISSO) 40 MG tablet Take 1 tablet (40 mg total) by mouth daily. 30 tablet 2   prochlorperazine (COMPAZINE) 10 MG tablet Take 1 tablet (10 mg total) by mouth every 6 (six) hours as needed for nausea or vomiting. 30 tablet 0   propranolol ER (INDERAL LA) 60 MG 24 hr capsule Take 1 capsule (60 mg total) by mouth at bedtime. 90 capsule 3   No current facility-administered medications for this encounter.    REVIEW OF SYSTEMS:  On review of systems, the patient reports that she is doing well overall. She continues with mild expressive aphasia but reports that it is not progressively worsening. She denies headaches, visual changes, tremor or focal weakness. She denies any chest pain, shortness of breath, cough, fevers, chills, night sweats, or recent unintended weight changes. She denies any bowel or bladder disturbances, and denies abdominal pain, nausea or vomiting. She denies any new musculoskeletal or joint aches or pains. A complete review of systems is obtained and is otherwise negative.    PHYSICAL EXAM:  Wt Readings from Last 3  Encounters:  10/14/21 165 lb 6.4 oz (75 kg)  10/14/21 165 lb 6.4 oz (75 kg)  10/11/21 164 lb 5 oz (74.5 kg)   Temp Readings from Last 3 Encounters:  10/14/21 97.6 F (36.4 C)  10/14/21 97.6  F (36.4 C) (Oral)  10/11/21 97.7 F (36.5 C) (Tympanic)   BP Readings from Last 3 Encounters:  10/14/21 116/72  10/14/21 116/72  10/11/21 127/72   Pulse Readings from Last 3 Encounters:  10/14/21 68  10/14/21 68  10/11/21 84   Pain Assessment Pain Score: 0-No pain/10  Unable to assess due to telephone follow up visit format.  KPS = 90  100 - Normal; no complaints; no evidence of disease. 90   - Able to carry on normal activity; minor signs or symptoms of disease. 80   - Normal activity with effort; some signs or symptoms of disease. 47   - Cares for self; unable to carry on normal activity or to do active work. 60   - Requires occasional assistance, but is able to care for most of his personal needs. 50   - Requires considerable assistance and frequent medical care. 11   - Disabled; requires special care and assistance. 73   - Severely disabled; hospital admission is indicated although death not imminent. 77   - Very sick; hospital admission necessary; active supportive treatment necessary. 10   - Moribund; fatal processes progressing rapidly. 0     - Dead  Karnofsky DA, Abelmann [place], Craver LS and Burchenal [facility] [phone]) The use of the nitrogen mustards in the palliative treatment of carcinoma: with particular reference to bronchogenic carcinoma Cancer 1 634-56  LABORATORY DATA:  Lab Results  Component Value Date   WBC 6.5 10/04/2021   HGB 12.3 10/04/2021   HCT 39.1 10/04/2021   MCV 94.2 10/04/2021   PLT 163 10/04/2021   Lab Results  Component Value Date   NA 140 10/04/2021   K 3.7 10/04/2021   CL 106 10/04/2021   CO2 27 10/04/2021   Lab Results  Component Value Date   ALT 27 10/04/2021   AST 21 10/04/2021   ALKPHOS 47 10/04/2021   BILITOT 0.4 10/04/2021      RADIOGRAPHY: MR BRAIN W WO CONTRAST  Result Date: 10/08/2021 CLINICAL DATA:  Metastatic lung cancer.  Headaches. EXAM: MRI HEAD WITHOUT AND WITH CONTRAST TECHNIQUE: Multiplanar, multiecho pulse sequences of the brain and surrounding structures were obtained without and with intravenous contrast. CONTRAST:  26m GADAVIST GADOBUTROL 1 MMOL/ML IV SOLN COMPARISON:  07/08/2021 FINDINGS: Brain: New lesions: 1. 6 mm ring-enhancing lesion anteriorly in the right cerebellum (series 1034, image 36). 2. 5 mm enhancing lesion in the right parietal lobe (series 1034, image 120). 3. 12 mm enhancing lesion in the right frontal lobe (series 1034, image 128). 4. 4 mm enhancing lesion in the left occipital lobe (series 1034, image 65). 5. 1 mm enhancing lesion in the superior aspect of the left occipital lobe (series 1034, image 83). 6. 5 mm enhancing lesion in the left precentral gyrus (series 1034, image 135). Larger lesions: None. Stable or smaller lesions: Decreased enhancement and edema the left frontal resection site. Other brain findings: There is mild edema associated with some of the new brain metastases, most notably the 12 mm right frontal metastasis. There is no associated mass effect. No acute infarct, midline shift, or extra-axial fluid collection is evident. The ventricles are normal in size. Vascular: Major intracranial vascular flow voids are preserved. Skull and upper cervical spine: Unremarkable bone marrow signal. Sinuses/Orbits: Persistent prominent mucosal thickening in the right sphenoid sinus. Clear mastoid air cells. Bilateral cataract extraction. Other: None. IMPRESSION: 1. 6 new brain metastases with up to mild edema. 2. Decreased enhancement and edema at the left  frontal resection site. Electronically Signed   By: Logan Bores M.D.   On: 10/08/2021 20:48   CT CHEST ABDOMEN PELVIS W CONTRAST  Result Date: 09/21/2021 CLINICAL DATA:  Non-small cell lung cancer restaging, metastatic to brain EXAM: CT  CHEST, ABDOMEN, AND PELVIS WITH CONTRAST TECHNIQUE: Multidetector CT imaging of the chest, abdomen and pelvis was performed following the standard protocol during bolus administration of intravenous contrast. RADIATION DOSE REDUCTION: This exam was performed according to the departmental dose-optimization program which includes automated exposure control, adjustment of the mA and/or kV according to patient size and/or use of iterative reconstruction technique. CONTRAST:  18m OMNIPAQUE IOHEXOL 300 MG/ML SOLN, additional oral enteric contrast COMPARISON:  MR abdomen, 08/12/2021, CT chest abdomen pelvis, 07/29/2021 FINDINGS: CT CHEST FINDINGS Cardiovascular: Aortic atherosclerosis. Normal heart size. No pericardial effusion. Mediastinum/Nodes: No enlarged mediastinal, hilar, or axillary lymph nodes. Thyroid gland, trachea, and esophagus demonstrate no significant findings. Lungs/Pleura: Unchanged, spiculated mass of the posterior left upper lobe measuring 1.6 x 1.5 cm (series 3, image 52). Bandlike scarring the bilateral lung bases. No pleural effusion or pneumothorax. Musculoskeletal: No chest wall mass or suspicious osseous lesions identified. CT ABDOMEN PELVIS FINDINGS Hepatobiliary: Significant interval enlargement of a hypoenhancing mass of the anterior right lobe of the liver, hepatic segment V, measuring 2.2 x 2.2 cm, previously 1.8 x 1.4 cm by MR (series 2, image 61). No gallstones, gallbladder wall thickening, or biliary dilatation. Pancreas: Unremarkable. No pancreatic ductal dilatation or surrounding inflammatory changes. Spleen: Normal in size without significant abnormality. Adrenals/Urinary Tract: Adrenal glands are unremarkable. Kidneys are normal, without renal calculi, solid lesion, or hydronephrosis. Bladder is unremarkable. Stomach/Bowel: Stomach is within normal limits. Appendix is surgically absent. No evidence of bowel wall thickening, distention, or inflammatory changes. Descending and sigmoid  diverticulosis. Vascular/Lymphatic: Aortic atherosclerosis. No enlarged abdominal or pelvic lymph nodes. Reproductive: No mass or other abnormality. Other: No abdominal wall hernia or abnormality. No ascites. Musculoskeletal: No acute osseous findings. IMPRESSION: 1. Unchanged, spiculated mass of the posterior left upper lobe measuring 1.6 x 1.5 cm. 2. Significant interval enlargement of a hypoenhancing mass of the anterior right lobe of the liver, consistent with an enlarging hepatic metastasis. 3. Diverticulosis without evidence of acute diverticulitis. Aortic Atherosclerosis (ICD10-I70.0). Electronically Signed   By: ADelanna AhmadiM.D.   On: 09/21/2021 16:38      IMPRESSION/PLAN:  This visit was conducted via telephone to spare the patient unnecessary potential exposure in the healthcare setting during the current COVID-19 pandemic.  1. 76y.o. with progressive brain metastasis and a new, enlarging liver metastasis secondary to Stage IV (T1c, N1, M1c) non-small cell lung cancer, adenocarcinoma of the left lung.  Today, I talked to the patient about the findings and workup thus far. We discussed the natural history of metastatic NSCLC and general treatment, highlighting the role of radiotherapy in the management. We discussed the available radiation techniques, and focused on the details and logistics of delivery. The recommendation is for a single fraction SRS treatment to the 6 new brain lesions. Additionally, we would recommend a 5 fraction course of stereotactic body radiotherapy (SBRT) to the enlarging liver lesion. We reviewed the anticipated acute and late sequelae associated with radiation in this setting. The patient was encouraged to ask questions that were answered to her stated satisfaction.  At the conclusion of our conversation, she is in agreement to proceed with the single fraction SRS treatment to the 6 new brain lesions. She is tentatively scheduled for CT SIM on 10/14/21  and will sign  written consent to proceed at that time and a copy of this document will be placed in her medical record. Her treatment will be coordinated to include Dr. Zada Finders, her neurosurgeon, in the planning and delivery of treatment. She is also in agreement to proceed with the 5 fraction SBRT to the liver lesion so we will share our discussion with Dr. Julien Nordmann and move forward with coordinating for fiducial marker placement, prior to CT SIM, in anticipation of beginning the treatments in the very near future. She appears to have a good understanding of her disease and our recommendations for treatment which are of palliative intent and is comfortable and in agreement with the stated plan. I enjoyed meeting with her again today and look forward to continuing to participate in her care.  Given current concerns for patient exposure during the COVID-19 pandemic, this encounter was conducted via telephone. The patient was notified in advance and was offered a [place] meeting to allow for face to face communication but unfortunately reported that he/she did not have the appropriate resources/technology to support such a visit and instead preferred to proceed with telephone consult. The patient has given verbal consent for this type of encounter. The attendants for this meeting include Jerran Tappan PA-C, and patient, Maeci Kalbfleisch. During the encounter, Kushal Saunders PA-C, was located at [facility] Radiation Oncology Department.  Patient, Gerda Yin was located at home.   I personally spent 30 minutes in this encounter including chart review, reviewing radiological studies, meeting face-to-face with the patient, entering orders and completing documentation.    Nicholos Johns, PA-C    Tyler Pita, MD  [facility] Direct Dial: [phone]   Fax: [phone] [email or web address]   Skype   LinkedIn

## 2021-10-14 NOTE — Progress Notes (Signed)
°  Radiation Oncology         [phone] ________________________________  Name: Marie Hensley MRN: [id number]  Date: 10/14/2021  DOB: 1946/03/05  SIMULATION AND TREATMENT PLANNING NOTE    ICD-10-CM   1. Non-small cell lung cancer metastatic to liver (HCC)  C34.90 sodium chloride flush (NS) 0.9 % injection 10 mL   C78.7     2. Brain metastasis ([place])  C79.31       DIAGNOSIS:  76 yo woman with 6 brain metastases from cancer of the left upper lung  6 mm ring-enhancing lesion anteriorly in the right cerebellum (series 1034, image 36).   5 mm enhancing lesion in the right parietal lobe (series 1034, image 120).    12 mm enhancing lesion in the right frontal lobe (series 1034, image 128).     4 mm enhancing lesion in the left occipital lobe (series 1034, image 65).     1 mm enhancing lesion in the superior aspect of the left occipital lobe (series 1034, image 83).    5 mm enhancing lesion in the left precentral gyrus (series 1034, image 135).               NARRATIVE:  The patient was brought to the [facility].  Identity was confirmed.  All relevant records and images related to the planned course of therapy were reviewed.  The patient freely provided informed written consent to proceed with treatment after reviewing the details related to the planned course of therapy. The consent form was witnessed and verified by the simulation staff. Intravenous access was established for contrast administration. Then, the patient was set-up in a stable reproducible supine position for radiation therapy.  A relocatable thermoplastic stereotactic head frame was fabricated for precise immobilization.  CT images were obtained.  Surface markings were placed.  The CT images were loaded into the planning software and fused with the patient's targeting MRI scan.  Then the target and avoidance structures were contoured.  Treatment planning then occurred.  The  radiation prescription was entered and confirmed.  I have requested 3D planning  I have requested a DVH of the following structures: Brain stem, brain, left eye, right eye, lenses, optic chiasm, target volumes, uninvolved brain, and normal tissue.    SPECIAL TREATMENT PROCEDURE:  The planned course of therapy using radiation constitutes a special treatment procedure. Special care is required in the management of this patient for the following reasons. This treatment constitutes a Special Treatment Procedure for the following reason: High dose per fraction requiring special monitoring for increased toxicities of treatment including daily imaging.  The special nature of the planned course of radiotherapy will require increased physician supervision and oversight to ensure patient's safety with optimal treatment outcomes.  This requires extended time and effort.  PLAN:  The patient will receive 20 Gy in 1 fraction.  ________________________________  Sheral Apley Tammi Klippel, M.D.

## 2021-10-15 ENCOUNTER — Ambulatory Visit: Payer: Medicare HMO

## 2021-10-15 ENCOUNTER — Ambulatory Visit: Payer: Medicare HMO | Admitting: Urology

## 2021-10-15 ENCOUNTER — Telehealth: Payer: Self-pay | Admitting: *Deleted

## 2021-10-15 ENCOUNTER — Ambulatory Visit: Payer: Medicare HMO | Admitting: Radiation Oncology

## 2021-10-15 DIAGNOSIS — C3490 Malignant neoplasm of unspecified part of unspecified bronchus or lung: Secondary | ICD-10-CM | POA: Diagnosis not present

## 2021-10-15 DIAGNOSIS — C3492 Malignant neoplasm of unspecified part of left bronchus or lung: Secondary | ICD-10-CM | POA: Diagnosis not present

## 2021-10-15 DIAGNOSIS — C787 Secondary malignant neoplasm of liver and intrahepatic bile duct: Secondary | ICD-10-CM | POA: Diagnosis not present

## 2021-10-15 DIAGNOSIS — C3412 Malignant neoplasm of upper lobe, left bronchus or lung: Secondary | ICD-10-CM | POA: Diagnosis not present

## 2021-10-15 DIAGNOSIS — C7931 Secondary malignant neoplasm of brain: Secondary | ICD-10-CM | POA: Diagnosis not present

## 2021-10-15 NOTE — Telephone Encounter (Signed)
Returned patient's phone call, spoke with patient 

## 2021-10-17 ENCOUNTER — Encounter: Payer: Self-pay | Admitting: Internal Medicine

## 2021-10-19 ENCOUNTER — Ambulatory Visit: Payer: Medicare HMO | Admitting: Radiation Oncology

## 2021-10-20 ENCOUNTER — Telehealth: Payer: Self-pay

## 2021-10-20 ENCOUNTER — Encounter: Payer: Self-pay | Admitting: Radiation Oncology

## 2021-10-20 ENCOUNTER — Encounter: Payer: Medicare HMO | Admitting: Family Medicine

## 2021-10-20 ENCOUNTER — Encounter (HOSPITAL_COMMUNITY): Payer: Self-pay | Admitting: Radiology

## 2021-10-20 ENCOUNTER — Other Ambulatory Visit: Payer: Self-pay | Admitting: Physician Assistant

## 2021-10-20 ENCOUNTER — Other Ambulatory Visit: Payer: Self-pay | Admitting: Radiation Therapy

## 2021-10-20 ENCOUNTER — Other Ambulatory Visit: Payer: Self-pay

## 2021-10-20 ENCOUNTER — Ambulatory Visit
Admission: RE | Admit: 2021-10-20 | Discharge: 2021-10-20 | Disposition: A | Discharge status: Home / Self Care | Payer: Medicare HMO | Source: Ambulatory Visit | Attending: Radiation Oncology | Admitting: Radiation Oncology

## 2021-10-20 VITALS — BP 117/64 | HR 93 | Temp 97.6°F | Resp 20

## 2021-10-20 DIAGNOSIS — C3490 Malignant neoplasm of unspecified part of unspecified bronchus or lung: Secondary | ICD-10-CM | POA: Diagnosis not present

## 2021-10-20 DIAGNOSIS — Z85118 Personal history of other malignant neoplasm of bronchus and lung: Secondary | ICD-10-CM | POA: Diagnosis not present

## 2021-10-20 DIAGNOSIS — C7931 Secondary malignant neoplasm of brain: Secondary | ICD-10-CM | POA: Diagnosis not present

## 2021-10-20 DIAGNOSIS — C3412 Malignant neoplasm of upper lobe, left bronchus or lung: Secondary | ICD-10-CM

## 2021-10-20 DIAGNOSIS — C3492 Malignant neoplasm of unspecified part of left bronchus or lung: Secondary | ICD-10-CM | POA: Diagnosis not present

## 2021-10-20 DIAGNOSIS — C787 Secondary malignant neoplasm of liver and intrahepatic bile duct: Secondary | ICD-10-CM | POA: Diagnosis not present

## 2021-10-20 NOTE — Telephone Encounter (Signed)
Spoke with Dr. Johny Shears nurse, Brayton Layman, She will see pt prior to her radiation tx to assess symptom management needs voiced by daughter prior to radiation tx today at 12:45.

## 2021-10-20 NOTE — Telephone Encounter (Signed)
Spoke with patient's daughter, [name], in person while pt in rad onc. Daughter is concerned because patient has been experiencing muscle aches "all over but mostly in her shoulders for the last two days" and they are concerned that it might be due to [place]. "We waited too long in dealing with diarrhea in the past and I want to make sure that it's not the medicine."  Informed patient that I would inform Dr. Mickeal Skinner and Maudie Mercury RN for follow-up.  Routed to Shelle Iron RN and Dr. Mickeal Skinner

## 2021-10-20 NOTE — Telephone Encounter (Signed)
Opened in error

## 2021-10-20 NOTE — Telephone Encounter (Signed)
Pt's daughter, [name], called and left message on Dr. Worthy Flank VM which was forwarded to Dr. Renda Rolls desk regarding pt having increased muscle aches and feeling "blah". Pt has radiation tx today and wanted to speak with nurse prior to apt. Returned call and left VM requesting call back to Dr. Renda Rolls desk to discuss.

## 2021-10-20 NOTE — Progress Notes (Signed)
Patient Name  Marie Hensley, Marie Hensley Legal Sex  Female DOB  1946-06-28 SSN  [id number] Address  [address]  [place] [address] Phone  [phone] (Home) *Preferred*  [phone] (Mobile)    RE: Marker Placement Received: Today Marie Mckusick, DO  Marie Hensley; P Ir Procedure Requests Cc: Marie Hensley OK for US guided biopsy and fiducial marker placement, liver lesion.   Will need [facility] to order gold markers for procedure.     Marie Hensley        Previous Messages   ----- Message -----  From: Marie Hensley D  Sent: 10/20/2021  10:15 AM EST  To: Ir Procedure Requests  Subject: FW: Marker Placement                           Question about Marker Placement and I see Marie Hensley is on Vacation this week.  ----- Message -----  From: Marie Hensley  Sent: 10/20/2021  10:04 AM EST  To: Marie Cleveland, MD  Subject: Marker Placement                               Hi it appears that office placed an order for a biopsy on this patient but that is not what they want done. After speaking with Marie Hensley at office, provider wants Markers Placed in Liver.  Please place 3-4 fiducial markers around periphery of liver metastasis for treatment planning/targeting. I have not scheduled one of these before and not sure where to start and what order to place. Can you help with this one or is this something that is done through IR.     Marie Hensley Legal Sex  Female DOB  15-May-1946 SSN  [id number] Address  [place]  [place] [address] Phone  [phone] (Home) *Preferred*  [phone] (Mobile)      RE: CT BIOPSY  Received: Today  Marie Cleveland, MD  Marie Hensley   [name] core biopsy liver met   DDH           Previous Messages     ----- Message -----  From: Marie Hensley  Sent: 10/13/2021   1:13 PM EST  To: Ir Procedure Requests  Subject: CT BIOPSY                                       Procedure:  CT BIOPSY   Reason:   Non-small  cell lung cancer metastatic to liver    History: CT and MRI  in computer     Provider: Freeman Hensley    Contact:    [phone]     Electronically signed by Marie Hensley at 10/14/2021  2:13 PM  Documentation on 10/14/2021   Documentation on 10/14/2021    Detailed Report   Note shared with patient  Additional Documentation   Encounter Info: Billing Info,  History,  Allergies,  Detailed Report

## 2021-10-20 NOTE — Progress Notes (Signed)
°  Name: Marie Hensley  MRN: [id number]  Date: 10/20/2021   DOB: 05-06-1946  Stereotactic Radiosurgery Operative Note  PRE-OPERATIVE DIAGNOSIS:  Lung adenocarcinoma with brain metastases  POST-OPERATIVE DIAGNOSIS:  Same  PROCEDURE:  Stereotactic Radiosurgery  SURGEON:  Judith Part, MD  NARRATIVE: The patient underwent a radiation treatment planning session in the radiation oncology simulation suite under the care of the radiation oncology physician and physicist.  I participated closely in the radiation treatment planning afterwards. The patient underwent planning CT which was fused to 3T high resolution MRI with 1 mm axial slices.  These images were fused on the planning system.  We contoured the gross target volumes and subsequently expanded this to yield the Planning Target Volume. I actively participated in the planning process.  I helped to define and review the target contours and also the contours of the optic pathway, eyes, brainstem and selected nearby organs at risk.  All the dose constraints for critical structures were reviewed and compared to AAPM Task Group 101.  The prescription dose conformity was reviewed.  I approved the plan electronically.    Accordingly, Marie Hensley was brought to the TrueBeam stereotactic radiation treatment linac and placed in the custom immobilization mask.  The patient was aligned according to the IR fiducial markers with BrainLab Exactrac, then orthogonal x-rays were used in ExacTrac with the 6DOF robotic table and the shifts were made to align the patient  Marie Hensley received stereotactic radiosurgery uneventfully.    Lesions treated:  6   Complex lesions treated:  0 (>3.5 cm, <39mm of optic path, or within the brainstem)   The detailed description of the procedure is recorded in the radiation oncology procedure note.  I was present for the duration of the procedure.  DISPOSITION:  Following delivery, the patient was  transported to nursing in stable condition and monitored for possible acute effects to be discharged to home in stable condition with follow-up in one month.  Judith Part, MD 10/20/2021 12:51 PM

## 2021-10-20 NOTE — Progress Notes (Signed)
Patient in for [facility] in nursing for 30 minute observation this afternoon denies vision changes, headache, nausea, and gait/balance is good.  Had some complaints this morning about possible side effects from chemotherapy medication it's being addressed by Dr. Mohamed/Dr. Renda Rolls office.  It was explained that this being her first radiation treatment today side effects may not be related to radiation.  She understood and agrees that it might be her chemotherapy medication.  She had past issues with side effect from the medication.  Daughter was concerned that if she didn't get in front of the cause of side effect it could cause more problems later.  During our visit both mother and daughter appear to be at ease that Dr. Worthy Flank office will be addressing concerns.  No further issues to address at this time.

## 2021-10-20 NOTE — Progress Notes (Signed)
°  Radiation Oncology         [phone] ________________________________  Stereotactic Treatment Procedure Note  Name: Marie Hensley MRN: [id number]  Date: 10/20/2021  DOB: 1946/05/19  SPECIAL TREATMENT PROCEDURE    ICD-10-CM   1. Brain metastasis ([place])  C79.31     2. Primary adenocarcinoma of left upper lobe of lung ([place])  C34.12       3D TREATMENT PLANNING AND DOSIMETRY:  The patient's radiation plan was reviewed and approved by neurosurgery and radiation oncology prior to treatment.  It showed 3-dimensional radiation distributions overlaid onto the planning CT/MRI image set.  The [facility] for the target structures as well as the organs at risk were reviewed. The documentation of the 3D plan and dosimetry are filed in the radiation oncology EMR.  NARRATIVE:  Marie Hensley was brought to the TrueBeam stereotactic radiation treatment machine and placed supine on the CT couch. The head frame was applied, and the patient was set up for stereotactic radiosurgery.  Neurosurgery was present for the set-up and delivery  SIMULATION VERIFICATION:  In the couch zero-angle position, the patient underwent Exactrac imaging using the Brainlab system with orthogonal KV images.  These were carefully aligned and repeated to confirm treatment position for each of the isocenters.  The Exactrac snap film verification was repeated at each couch angle.  PROCEDURE: Marie Hensley received stereotactic radiosurgery to the following targets:  These 6 targets were treated with single isocenter using 7 Rapid Arc VMAT Beams to a prescription dose of 20 Gy.  ExacTrac registration was performed for each couch angle.  The 100% isodose line was prescribed.  6 MV X-rays were delivered in the flattening filter free beam mode.  STEREOTACTIC TREATMENT MANAGEMENT:  Following delivery, the patient was transported to nursing in stable condition and monitored for possible acute effects.  Vital signs were recorded BP  117/64 (BP Location: Right Arm, Patient Position: Sitting, Cuff Size: Normal)    Pulse 93    Temp 97.6 F (36.4 C)    Resp 20    SpO2 96% . The patient tolerated treatment without significant acute effects, and was discharged to home in stable condition.    PLAN: Follow-up in one month.  ________________________________  Sheral Apley. Tammi Klippel, M.D.

## 2021-10-21 ENCOUNTER — Other Ambulatory Visit: Payer: Self-pay | Admitting: Internal Medicine

## 2021-10-21 ENCOUNTER — Telehealth: Payer: Self-pay

## 2021-10-21 MED ORDER — OXYCODONE HCL 5 MG PO TABS
5.0000 mg | ORAL_TABLET | Freq: Three times a day (TID) | ORAL | 0 refills | Status: DC | PRN
Start: 2021-10-21 — End: 2021-11-02

## 2021-10-21 MED ORDER — METHYLPREDNISOLONE 4 MG PO TBPK: ORAL_TABLET | ORAL | 0 refills | Status: DC

## 2021-10-21 NOTE — Telephone Encounter (Signed)
Pts daughter called back and I advised of her current plan for Radiation to the liver lesion and she is aware of this as she was just contacted to schedule this.   She wants to know what the pt can take when the pain arises again? Also, can be given anything to stimulate her appetite?  Discussed this with Dr. Julien Nordmann who will send a few tablets of Oxycodone for the pain and a Medrol dosepak for her appetite.  I have called pts daughter back and advised of this. She expressed understanding of this information.

## 2021-10-21 NOTE — Telephone Encounter (Signed)
Pts daughter LM stating the pt experienced "stabbing pain" in her liver yesterday between 6:30-8 (it is unclear if this was in the morning of  afternoon).  I reviewed pts records and found that  OV NOTE 10/04/21: "The patient recently had a restaging CT scan performed. Dr. Julien Nordmann personally and independently reviewed the scan and discussed the results with the patient. The scan was stable except it noted further enlargement in the liver mass.    Dr. Julien Nordmann recommends re-referring the patient to Dr. Tammi Klippel for consideration of SBRT to this lesion."

## 2021-10-21 NOTE — Telephone Encounter (Signed)
Per in basket message from Dr. Julien Nordmann, this writer called and left VM for daughter, [name], letting her know that per Dr. Julien Nordmann, muscle ache and fatigue can also happen with Tagrisso.  It is okay for her to hold it for several days and see if she is feeling better then she can resume it. Encouraged [name] to reach out for further questions.

## 2021-10-25 ENCOUNTER — Telehealth: Payer: Self-pay | Admitting: Internal Medicine

## 2021-10-25 NOTE — Telephone Encounter (Signed)
Rescheduled 02/22 appointment to 03/02 due patient's request.

## 2021-10-26 ENCOUNTER — Ambulatory Visit: Payer: Medicare HMO | Admitting: Radiation Oncology

## 2021-10-27 ENCOUNTER — Ambulatory Visit: Payer: Medicare HMO | Admitting: Radiation Oncology

## 2021-10-27 DIAGNOSIS — Z0100 Encounter for examination of eyes and vision without abnormal findings: Secondary | ICD-10-CM | POA: Diagnosis not present

## 2021-10-27 NOTE — Progress Notes (Signed)
Subjective:   Marie Hensley is a 76 y.o. female who presents for Medicare Annual (Subsequent) preventive examination.  I connected with Prudy Feeler today by telephone and verified that I am speaking with the correct person using two identifiers. Location patient: home Location provider: work Persons participating in the virtual visit: patient, Marine scientist.    I discussed the limitations, risks, security and privacy concerns of performing an evaluation and management service by telephone and the availability of in person appointments. I also discussed with the patient that there may be a patient responsible charge related to this service. The patient expressed understanding and verbally consented to this telephonic visit.    Interactive audio and video telecommunications were attempted between this provider and patient, however failed, due to patient having technical difficulties OR patient did not have access to video capability.  We continued and completed visit with audio only.  Some vital signs may be absent or patient reported.   Time Spent with patient on telephone encounter: 25 minutes  Review of Systems     Cardiac Risk Factors include: advanced age (>57men, >72 women);hypertension;dyslipidemia     Objective:    Today's Vitals   10/28/21 0944  Weight: 165 lb (74.8 kg)  Height: 5\' 4"  (1.626 m)   Body mass index is 28.32 kg/m.  Advanced Directives 10/28/2021 10/12/2021 08/23/2021 07/13/2021 07/13/2021 07/13/2021 07/10/2021  Does Patient Have a Medical Advance Directive? Yes Yes Yes Yes Yes Yes Yes  Type of Paramedic of [place];Living will - [place];Living will [place];Living will Living will;Healthcare Power of Attorney Living will;Healthcare Power of [place];Living will  Does patient want to make changes to medical advance directive? Yes (MAU/Ambulatory/Procedural Areas -  Information given) - No - Patient declined - - No - Patient declined No - Patient declined  Copy of [place] in Chart? Yes - validated most recent copy scanned in chart (See row information) - No - copy requested - - - No - copy requested  Would patient like information on creating a medical advance directive? - - - No - Patient declined - - No - Patient declined    Current Medications (verified) Outpatient Encounter Medications as of 10/28/2021  Medication Sig   acetaminophen (TYLENOL) 500 MG tablet Take 1,000 mg by mouth every 6 (six) hours as needed for mild pain (or arthritis).   atorvastatin (LIPITOR) 40 MG tablet Take 1 tablet (40 mg total) by mouth at bedtime.   cetirizine (ZYRTEC) 10 MG tablet Take 10 mg by mouth daily as needed for allergies or rhinitis.   citalopram (CELEXA) 20 MG tablet Take 1 tablet (20 mg total) by mouth at bedtime. For depression   dexamethasone (DECADRON) 1 MG tablet Take 1 tablet (1 mg total) by mouth daily.   diphenoxylate-atropine (LOMOTIL) 2.5-0.025 MG tablet Take 1 tablet by mouth 4 (four) times daily as needed for diarrhea or loose stools.   fluticasone (FLONASE) 50 MCG/ACT nasal spray Place 1 spray into both nostrils daily as needed for allergies.   levETIRAcetam (KEPPRA) 500 MG tablet TAKE TWO TABLETS BY MOUTH TWICE A DAY   lisinopril-hydrochlorothiazide (ZESTORETIC) 10-12.5 MG tablet Take 1 tablet by mouth daily.   loperamide (IMODIUM A-D) 2 MG tablet Take 2 mg by mouth 2 (two) times daily as needed for diarrhea or loose stools.   methylPREDNISolone (MEDROL DOSEPAK) 4 MG TBPK tablet Use as instructed   osimertinib mesylate (TAGRISSO) 40 MG tablet  Take 1 tablet (40 mg total) by mouth daily.   oxyCODONE (OXY IR/ROXICODONE) 5 MG immediate release tablet Take 1 tablet (5 mg total) by mouth 3 (three) times daily as needed for severe pain.   prochlorperazine (COMPAZINE) 10 MG tablet Take 1 tablet (10 mg total) by mouth every 6 (six) hours as  needed for nausea or vomiting.   propranolol ER (INDERAL LA) 60 MG 24 hr capsule Take 1 capsule (60 mg total) by mouth at bedtime.   No facility-administered encounter medications on file as of 10/28/2021.    Allergies (verified) Patient has no known allergies.   History: Past Medical History:  Diagnosis Date   Arthritis    Brain tumor ([place])    Depression    Dyspnea    WITH EXERTION-SEEING DR [name]   HTN (hypertension)    Hyperlipemia    Seizures (HCC)    Past Surgical History:  Procedure Laterality Date   APPENDECTOMY  3546   APPLICATION OF CRANIAL NAVIGATION N/A 04/08/2021   Procedure: APPLICATION OF CRANIAL NAVIGATION;  Surgeon: Judith Part, MD;  Location: [facility];  Service: Neurosurgery;  Laterality: N/A;  RM 21   CRANIOTOMY Left 04/08/2021   Procedure: Left Craniotomy for tumor resection with brainlab;  Surgeon: Judith Part, MD;  Location: [facility];  Service: Neurosurgery;  Laterality: Left;   EYE SURGERY Bilateral    CATARACTS   OOPHORECTOMY Right    VIDEO BRONCHOSCOPY WITH ENDOBRONCHIAL NAVIGATION Left 12/23/2020   Procedure: VIDEO BRONCHOSCOPY WITH ENDOBRONCHIAL NAVIGATION;  Surgeon: Tyler Pita, MD;  Location: [facility];  Service: Pulmonary;  Laterality: Left;   VIDEO BRONCHOSCOPY WITH ENDOBRONCHIAL NAVIGATION Left 02/01/2021   Procedure: ROBOTIC ASSISTED VIDEO BRONCHOSCOPY WITH ENDOBRONCHIAL NAVIGATION;  Surgeon: Tyler Pita, MD;  Location: [facility];  Service: Pulmonary;  Laterality: Left;   VIDEO BRONCHOSCOPY WITH ENDOBRONCHIAL ULTRASOUND Left 12/23/2020   Procedure: VIDEO BRONCHOSCOPY WITH ENDOBRONCHIAL ULTRASOUND;  Surgeon: Tyler Pita, MD;  Location: [facility];  Service: Pulmonary;  Laterality: Left;   VIDEO BRONCHOSCOPY WITH ENDOBRONCHIAL ULTRASOUND Left 02/01/2021   Procedure: ROBOTIC ASSITSTED VIDEO BRONCHOSCOPY WITH ENDOBRONCHIAL ULTRASOUND;  Surgeon: Tyler Pita, MD;  Location: [facility];  Service: Pulmonary;  Laterality: Left;    Family History  Problem Relation Age of Onset   Addison's disease Daughter    Hypertension Mother        died at 11   Alcohol abuse Father    Social History   Socioeconomic History   Marital status: Single    Spouse name: Not on file   Number of children: 3   Years of education: high school    Highest education level: Not on file  Occupational History   Occupation: retired  Tobacco Use   Smoking status: Former    Packs/day: 0.50    Years: 20.00    Pack years: 10.00    Types: Cigarettes    Quit date: 10/27/1989    Years since quitting: 32.0   Smokeless tobacco: Never   Tobacco comments:    Smoked off and on in 20 year period  Vaping Use   Vaping Use: Never used  Substance and Sexual Activity   Alcohol use: Not Currently    Comment: 3-4 times a week, 2oz liquor    Drug use: Never   Sexual activity: Not Currently    Birth control/protection: Post-menopausal  Other Topics Concern   Not on file  Social History Narrative   She has discussed advanced directives with her.  She would want CPR and intubation if chance of recovery.   11/19/20   From: [place], moved for work 1998   Living: alone   Work: retired Energy manager of [employer]: 3 children - [name], [name], [name] - 5 grandchildren, 1 great-grandchild      Enjoys: shop, casino, cruise travel      Exercise: not as much due to the fatigue   Diet: tries to eat healthy, but eats what she wants      Safety   Seat belts: Yes    Guns: No   Safe in relationships: Yes    Social Determinants of Radio broadcast assistant Strain: Low Risk    Difficulty of Paying Living Expenses: Not hard at all  Food Insecurity: No Food Insecurity   Worried About Charity fundraiser in the Last Year: Never true   Arboriculturist in the Last Year: Never true  Transportation Needs: No Transportation Needs   Lack of Transportation (Medical): No   Lack of Transportation (Non-Medical): No  Physical Activity: Insufficiently  Active   Days of Exercise per Week: 3 days   Minutes of Exercise per Session: 20 min  Stress: No Stress Concern Present   Feeling of Stress : Not at all  Social Connections: Socially Isolated   Frequency of Communication with Friends and Family: More than three times a week   Frequency of Social Gatherings with Friends and Family: Three times a week   Attends Religious Services: Never   Active Member of Clubs or Organizations: No   Attends Archivist Meetings: Never   Marital Status: Never married    Tobacco Counseling Counseling given: Not Answered Tobacco comments: Smoked off and on in 20 year period   Clinical Intake:  Pre-visit preparation completed: Yes  Pain : No/denies pain     BMI - recorded: 28.32 Nutritional Status: BMI 25 -29 Overweight Nutritional Risks: None Diabetes: No  How often do you need to have someone help you when you read instructions, pamphlets, or other written materials from your doctor or pharmacy?: 1 - Never  Diabetic?No  Interpreter Needed?: No  Information entered by :: Orrin Brigham LPN   Activities of Daily Living In your present state of health, do you have any difficulty performing the following activities: 10/28/2021  Hearing? N  Vision? N  Difficulty concentrating or making decisions? N  Walking or climbing stairs? N  Dressing or bathing? N  Doing errands, shopping? N  Preparing Food and eating ? N  Using the Toilet? N  In the past six months, have you accidently leaked urine? N  Do you have problems with loss of bowel control? N  Managing your Medications? N  Managing your Finances? N  Housekeeping or managing your Housekeeping? N  Some recent data might be hidden    Patient Care Team: [name], MD as PCP - General (Family Medicine) Valrie Hart, RN as Oncology Nurse Navigator (Medical Oncology)  Indicate any recent Medical Services you may have received from other than [facility] providers in the past  year (date may be approximate).     Assessment:   This is a routine wellness examination for [facility].  Hearing/Vision screen Hearing Screening - Comments:: No issues  Vision Screening - Comments:: Last exam 10/2021, [facility], wears glasses as needed   Dietary issues and exercise activities discussed: Current Exercise Habits: Home exercise routine, Type of exercise:  walking, Time (Minutes): 20, Frequency (Times/Week): 3, Weekly Exercise (Minutes/Week): 60, Intensity: Mild   Goals Addressed             This Visit's Progress    Patient Stated       Would like to eat healthier and maintain current routine.        Depression Screen PHQ 2/9 Scores 10/28/2021 03/03/2021 11/19/2020 10/28/2019  PHQ - 2 Score 0 0 0 2  PHQ- 9 Score - 1 - 4    Fall Risk Fall Risk  10/28/2021 11/19/2020 10/28/2019  Falls in the past year? 1 0 1  Number falls in past yr: 0 0 0  Comment tripped - tripped in yard, fell on knee  Injury with Fall? 0 - 0  Risk for fall due to : No Fall Risks - -  Follow up Falls prevention discussed - -    FALL RISK PREVENTION PERTAINING TO THE HOME:  Any stairs in or around the home? Yes  If so, are there any without handrails? No  Home free of loose throw rugs in walkways, pet beds, electrical cords, etc? Yes  Adequate lighting in your home to reduce risk of falls? No   ASSISTIVE DEVICES UTILIZED TO PREVENT FALLS:  Life alert? No  Use of a cane, walker or w/c? No  Grab bars in the bathroom? Yes  Shower chair or bench in shower? Yes  Elevated toilet seat or a handicapped toilet? Yes   TIMED UP AND GO:  Was the test performed? No .    Cognitive Function: Normal cognitive status assessed by  this Nurse Health Advisor. No abnormalities found.          Immunizations Immunization History  Administered Date(s) Administered   Fluad Quad(high Dose 65+) 05/29/2020, 05/24/2021   Influenza,inj,Quad PF,6+ Mos 07/14/2019   Influenza-Unspecified  06/23/2021   PFIZER Comirnaty(Gray Top)Covid-19 Tri-Sucrose Vaccine 09/30/2020   PFIZER(Purple Top)SARS-COV-2 Vaccination 12/27/2019, 01/17/2020   Pneumococcal Conjugate-13 11/12/2015   Pneumococcal Polysaccharide-23 07/20/2017    TDAP status: Due, Education has been provided regarding the importance of this vaccine. Advised may receive this vaccine at local pharmacy or Health Dept. Aware to provide a copy of the vaccination record if obtained from local pharmacy or Health Dept. Verbalized acceptance and understanding.  Flu Vaccine status: Up to date  Pneumococcal vaccine status: Up to date  Covid-19 vaccine status: Information provided on how to obtain vaccines.   Qualifies for Shingles Vaccine? Yes   Zostavax completed No   Shingrix Completed?: No.    Education has been provided regarding the importance of this vaccine. Patient has been advised to call insurance company to determine out of pocket expense if they have not yet received this vaccine. Advised may also receive vaccine at local pharmacy or Health Dept. Verbalized acceptance and understanding.  Screening Tests Health Maintenance  Topic Date Due   Zoster Vaccines- Shingrix (1 of 2) Never done   DEXA SCAN  Never done   COVID-19 Vaccine (4 - Booster for Pfizer series) 11/25/2020   TETANUS/TDAP  03/03/2022 (Originally 09/10/1965)   Pneumonia Vaccine 39+ Years old  Completed   INFLUENZA VACCINE  Completed   Hepatitis C Screening  Completed   HPV VACCINES  Aged Out    Health Maintenance  Health Maintenance Due  Topic Date Due   Zoster Vaccines- Shingrix (1 of 2) Never done   DEXA SCAN  Never done   COVID-19 Vaccine (4 - Booster for Pfizer series) 11/25/2020    Colorectal  cancer screening: No longer required.   Mammogram status: due, patient plans to complete screening after caner treatment  Bone Density status: due, patient plans to complete screening after caner treatment  Lung Cancer Screening: (Low Dose CT  Chest recommended if Age 69-80 years, 30 pack-year currently smoking OR have quit w/in 15years.) does not qualify.     Additional Screening:  Hepatitis C Screening: does qualify; Completed 07/10/20  Vision Screening: Recommended annual ophthalmology exams for early detection of glaucoma and other disorders of the eye. Is the patient up to date with their annual eye exam?  Yes  Who is the provider or what is the name of the office in which the patient attends annual eye exams? [facility]    Dental Screening: Recommended annual dental exams for proper oral hygiene  Community Resource Referral / Chronic Care Management: CRR required this visit?  No   CCM required this visit?  No      Plan:     I have personally reviewed and noted the following in the patients chart:   Medical and social history Use of alcohol, tobacco or illicit drugs  Current medications and supplements including opioid prescriptions.  Functional ability and status Nutritional status Physical activity Advanced directives List of other physicians Hospitalizations, surgeries, and ER visits in previous 12 months Vitals Screenings to include cognitive, depression, and falls Referrals and appointments  In addition, I have reviewed and discussed with patient certain preventive protocols, quality metrics, and best practice recommendations. A written personalized care plan for preventive services as well as general preventive health recommendations were provided to patient.   Due to this being a telephonic visit, the after visit summary with patients personalized plan was offered to patient via mail or my-chart. Patient would like to access on my-chart.    Loma Messing, LPN   8/83/2549   Nurse Health Advisor  Nurse Notes: none

## 2021-10-28 ENCOUNTER — Ambulatory Visit: Payer: Medicare HMO | Admitting: Radiation Oncology

## 2021-10-28 ENCOUNTER — Ambulatory Visit (INDEPENDENT_AMBULATORY_CARE_PROVIDER_SITE_OTHER): Payer: Medicare HMO

## 2021-10-28 ENCOUNTER — Other Ambulatory Visit: Payer: Self-pay | Admitting: Student

## 2021-10-28 ENCOUNTER — Other Ambulatory Visit (HOSPITAL_COMMUNITY): Payer: Self-pay | Admitting: Physician Assistant

## 2021-10-28 ENCOUNTER — Other Ambulatory Visit: Payer: Self-pay | Admitting: Radiology

## 2021-10-28 VITALS — Ht 64.0 in | Wt 165.0 lb

## 2021-10-28 DIAGNOSIS — Z0000 Encounter for general adult medical examination without abnormal findings: Secondary | ICD-10-CM | POA: Diagnosis not present

## 2021-10-28 NOTE — Patient Instructions (Signed)
Marie Hensley , Thank you for taking time to complete your Medicare Wellness Visit. I appreciate your ongoing commitment to your health goals. Please review the following plan we discussed and let me know if I can assist you in the future.   Screening recommendations/referrals: Colonoscopy: no longer required  Mammogram: due, per our conversation you are holding off until cancer treatments are completed  Bone Density: due, per our conversation you are holding off until cancer treatments are completed Recommended yearly ophthalmology/optometry visit for glaucoma screening and checkup Recommended yearly dental visit for hygiene and checkup  Vaccinations: Influenza vaccine: up to date Pneumococcal vaccine: up to date  Tdap vaccine: due, per our conversation you are holding off until cancer treatments are completed Shingles vaccine: due, per our conversation you are holding off until cancer treatments are completed   Covid-19: newest booster available at your local pharmacy    Advanced directives: copy on file   Conditions/risks identified: see problem list  Next appointment: Follow up in one year for your annual wellness visit    Preventive Care 37 Years and Older, Female Preventive care refers to lifestyle choices and visits with your health care provider that can promote health and wellness. What does preventive care include? A yearly physical exam. This is also called an annual well check. Dental exams once or twice a year. Routine eye exams. Ask your health care provider how often you should have your eyes checked. Personal lifestyle choices, including: Daily care of your teeth and gums. Regular physical activity. Eating a healthy diet. Avoiding tobacco and drug use. Limiting alcohol use. Practicing safe sex. Taking low-dose aspirin every day. Taking vitamin and mineral supplements as recommended by your health care provider. What happens during an annual well check? The  services and screenings done by your health care provider during your annual well check will depend on your age, overall health, lifestyle risk factors, and family history of disease. Counseling  Your health care provider may ask you questions about your: Alcohol use. Tobacco use. Drug use. Emotional well-being. Home and relationship well-being. Sexual activity. Eating habits. History of falls. Memory and ability to understand (cognition). Work and work Statistician. Reproductive health. Screening  You may have the following tests or measurements: Height, weight, and BMI. Blood pressure. Lipid and cholesterol levels. These may be checked every 5 years, or more frequently if you are over 49 years old. Skin check. Lung cancer screening. You may have this screening every year starting at age 82 if you have a 30-pack-year history of smoking and currently smoke or have quit within the past 15 years. Fecal occult blood test (FOBT) of the stool. You may have this test every year starting at age 38. Flexible sigmoidoscopy or colonoscopy. You may have a sigmoidoscopy every 5 years or a colonoscopy every 10 years starting at age 45. Hepatitis C blood test. Hepatitis B blood test. Sexually transmitted disease (STD) testing. Diabetes screening. This is done by checking your blood sugar (glucose) after you have not eaten for a while (fasting). You may have this done every 1-3 years. Bone density scan. This is done to screen for osteoporosis. You may have this done starting at age 39. Mammogram. This may be done every 1-2 years. Talk to your health care provider about how often you should have regular mammograms. Talk with your health care provider about your test results, treatment options, and if necessary, the need for more tests. Vaccines  Your health care provider may recommend certain vaccines,  such as: Influenza vaccine. This is recommended every year. Tetanus, diphtheria, and acellular  pertussis (Tdap, Td) vaccine. You may need a Td booster every 10 years. Zoster vaccine. You may need this after age 75. Pneumococcal 13-valent conjugate (PCV13) vaccine. One dose is recommended after age 70. Pneumococcal polysaccharide (PPSV23) vaccine. One dose is recommended after age 31. Talk to your health care provider about which screenings and vaccines you need and how often you need them. This information is not intended to replace advice given to you by your health care provider. Make sure you discuss any questions you have with your health care provider. Document Released: 09/25/2015 Document Revised: 05/18/2016 Document Reviewed: 06/30/2015 Elsevier Interactive Patient Education  2017 [place] Prevention in the Home Falls can cause injuries. They can happen to people of all ages. There are many things you can do to make your home safe and to help prevent falls. What can I do on the outside of my home? Regularly fix the edges of walkways and driveways and fix any cracks. Remove anything that might make you trip as you walk through a door, such as a raised step or threshold. Trim any bushes or trees on the path to your home. Use bright outdoor lighting. Clear any walking paths of anything that might make someone trip, such as rocks or tools. Regularly check to see if handrails are loose or broken. Make sure that both sides of any steps have handrails. Any raised decks and porches should have guardrails on the edges. Have any leaves, snow, or ice cleared regularly. Use sand or salt on walking paths during winter. Clean up any spills in your garage right away. This includes oil or grease spills. What can I do in the bathroom? Use night lights. Install grab bars by the toilet and in the tub and shower. Do not use towel bars as grab bars. Use non-skid mats or decals in the tub or shower. If you need to sit down in the shower, use a plastic, non-slip stool. Keep the floor  dry. Clean up any water that spills on the floor as soon as it happens. Remove soap buildup in the tub or shower regularly. Attach bath mats securely with double-sided non-slip rug tape. Do not have throw rugs and other things on the floor that can make you trip. What can I do in the bedroom? Use night lights. Make sure that you have a light by your bed that is easy to reach. Do not use any sheets or blankets that are too big for your bed. They should not hang down onto the floor. Have a firm chair that has side arms. You can use this for support while you get dressed. Do not have throw rugs and other things on the floor that can make you trip. What can I do in the kitchen? Clean up any spills right away. Avoid walking on wet floors. Keep items that you use a lot in easy-to-reach places. If you need to reach something above you, use a strong step stool that has a grab bar. Keep electrical cords out of the way. Do not use floor polish or wax that makes floors slippery. If you must use wax, use non-skid floor wax. Do not have throw rugs and other things on the floor that can make you trip. What can I do with my stairs? Do not leave any items on the stairs. Make sure that there are handrails on both sides of the stairs and  use them. Fix handrails that are broken or loose. Make sure that handrails are as long as the stairways. Check any carpeting to make sure that it is firmly attached to the stairs. Fix any carpet that is loose or worn. Avoid having throw rugs at the top or bottom of the stairs. If you do have throw rugs, attach them to the floor with carpet tape. Make sure that you have a light switch at the top of the stairs and the bottom of the stairs. If you do not have them, ask someone to add them for you. What else can I do to help prevent falls? Wear shoes that: Do not have high heels. Have rubber bottoms. Are comfortable and fit you well. Are closed at the toe. Do not wear  sandals. If you use a stepladder: Make sure that it is fully opened. Do not climb a closed stepladder. Make sure that both sides of the stepladder are locked into place. Ask someone to hold it for you, if possible. Clearly mark and make sure that you can see: Any grab bars or handrails. First and last steps. Where the edge of each step is. Use tools that help you move around (mobility aids) if they are needed. These include: Canes. Walkers. Scooters. Crutches. Turn on the lights when you go into a dark area. Replace any light bulbs as soon as they burn out. Set up your furniture so you have a clear path. Avoid moving your furniture around. If any of your floors are uneven, fix them. If there are any pets around you, be aware of where they are. Review your medicines with your doctor. Some medicines can make you feel dizzy. This can increase your chance of falling. Ask your doctor what other things that you can do to help prevent falls. This information is not intended to replace advice given to you by your health care provider. Make sure you discuss any questions you have with your health care provider. Document Released: 06/25/2009 Document Revised: 02/04/2016 Document Reviewed: 10/03/2014 Elsevier Interactive Patient Education  2017 [employer].

## 2021-10-29 ENCOUNTER — Encounter (HOSPITAL_COMMUNITY): Payer: Self-pay

## 2021-10-29 ENCOUNTER — Ambulatory Visit: Payer: Medicare HMO | Admitting: Radiation Oncology

## 2021-10-29 ENCOUNTER — Ambulatory Visit (HOSPITAL_COMMUNITY)
Admission: RE | Admit: 2021-10-29 | Discharge: 2021-10-29 | Disposition: A | Discharge status: Home / Self Care | Payer: Medicare HMO | Source: Ambulatory Visit | Attending: Urology | Admitting: Urology

## 2021-10-29 ENCOUNTER — Other Ambulatory Visit: Payer: Self-pay

## 2021-10-29 DIAGNOSIS — Z87891 Personal history of nicotine dependence: Secondary | ICD-10-CM | POA: Diagnosis not present

## 2021-10-29 DIAGNOSIS — R69 Illness, unspecified: Secondary | ICD-10-CM | POA: Diagnosis not present

## 2021-10-29 DIAGNOSIS — E785 Hyperlipidemia, unspecified: Secondary | ICD-10-CM | POA: Insufficient documentation

## 2021-10-29 DIAGNOSIS — R569 Unspecified convulsions: Secondary | ICD-10-CM | POA: Insufficient documentation

## 2021-10-29 DIAGNOSIS — C787 Secondary malignant neoplasm of liver and intrahepatic bile duct: Secondary | ICD-10-CM | POA: Insufficient documentation

## 2021-10-29 DIAGNOSIS — F32A Depression, unspecified: Secondary | ICD-10-CM | POA: Diagnosis not present

## 2021-10-29 DIAGNOSIS — C3490 Malignant neoplasm of unspecified part of unspecified bronchus or lung: Secondary | ICD-10-CM | POA: Diagnosis not present

## 2021-10-29 DIAGNOSIS — M1990 Unspecified osteoarthritis, unspecified site: Secondary | ICD-10-CM | POA: Insufficient documentation

## 2021-10-29 DIAGNOSIS — K7689 Other specified diseases of liver: Secondary | ICD-10-CM | POA: Diagnosis not present

## 2021-10-29 DIAGNOSIS — I10 Essential (primary) hypertension: Secondary | ICD-10-CM | POA: Insufficient documentation

## 2021-10-29 DIAGNOSIS — K5790 Diverticulosis of intestine, part unspecified, without perforation or abscess without bleeding: Secondary | ICD-10-CM | POA: Diagnosis not present

## 2021-10-29 DIAGNOSIS — C3492 Malignant neoplasm of unspecified part of left bronchus or lung: Secondary | ICD-10-CM | POA: Diagnosis not present

## 2021-10-29 DIAGNOSIS — G9389 Other specified disorders of brain: Secondary | ICD-10-CM | POA: Diagnosis not present

## 2021-10-29 LAB — CBC
HCT: 42 % (ref 36.0–46.0)
Hemoglobin: 13.8 g/dL (ref 12.0–15.0)
MCH: 30.3 pg (ref 26.0–34.0)
MCHC: 32.9 g/dL (ref 30.0–36.0)
MCV: 92.3 fL (ref 80.0–100.0)
Platelets: 177 10*3/uL (ref 150–400)
RBC: 4.55 MIL/uL (ref 3.87–5.11)
RDW: 13.1 % (ref 11.5–15.5)
WBC: 8.3 10*3/uL (ref 4.0–10.5)
nRBC: 0 % (ref 0.0–0.2)

## 2021-10-29 LAB — PROTIME-INR
INR: 1 (ref 0.8–1.2)
Prothrombin Time: 13.4 seconds (ref 11.4–15.2)

## 2021-10-29 IMAGING — US US BIOPSY CORE LIVER
1 series · 11 of 11 positions shown · non-contrast
Comparison: None.

INDICATION: Concern for metastatic lung cancer. Please perform ultrasound-guided
fiducial marking of solitary lesion within the subcapsular aspect of
the anterior segment of the right lobe of the liver prior to
external beam radiation.

EXAM:
ULTRASOUND-GUIDED LIVER LESION FIDUCIAL MARKING.

[Series 1: us core biopsy (liver) mc & wl · 11 of 11 slices shown]
[im 1/11]
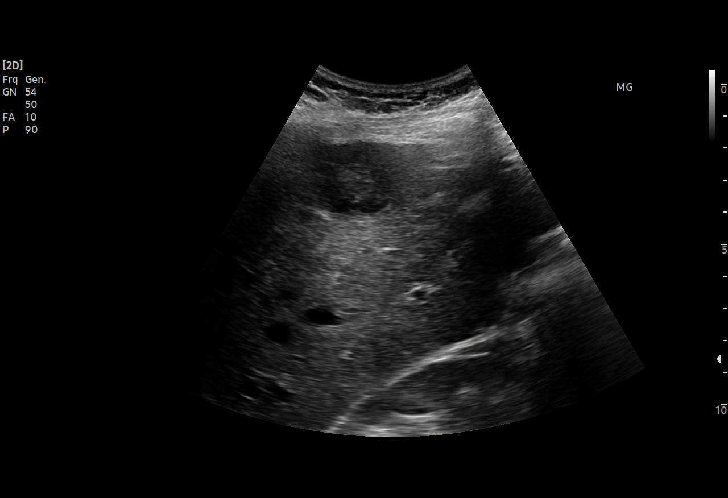
[im 2/11]
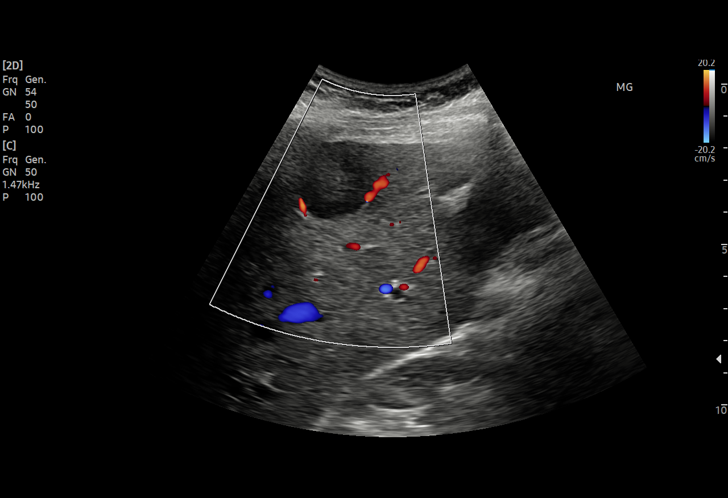
[im 3/11]
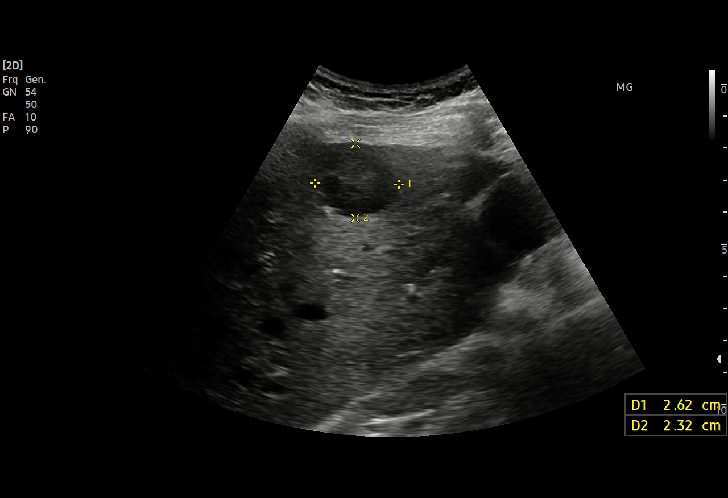
[im 4/11]
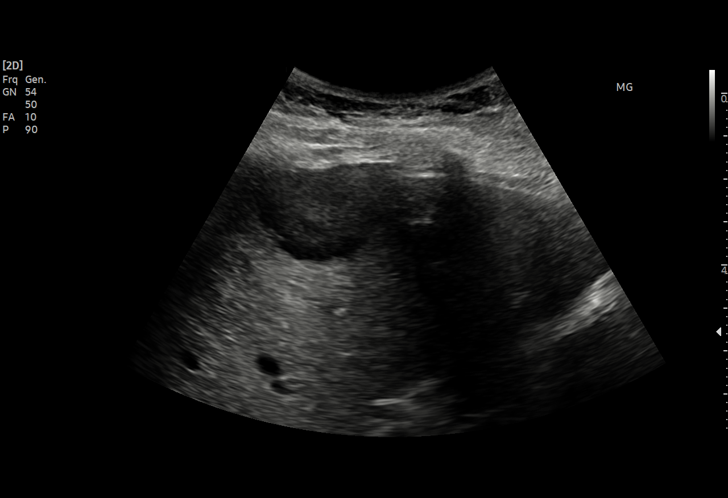
[im 5/11]
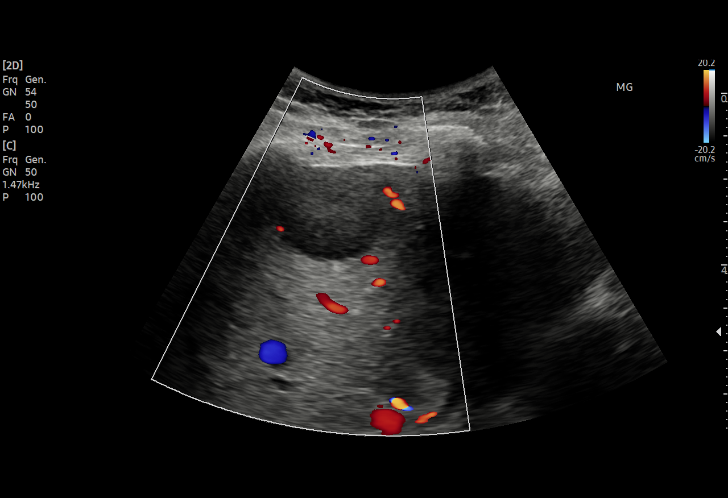
[im 6/11]
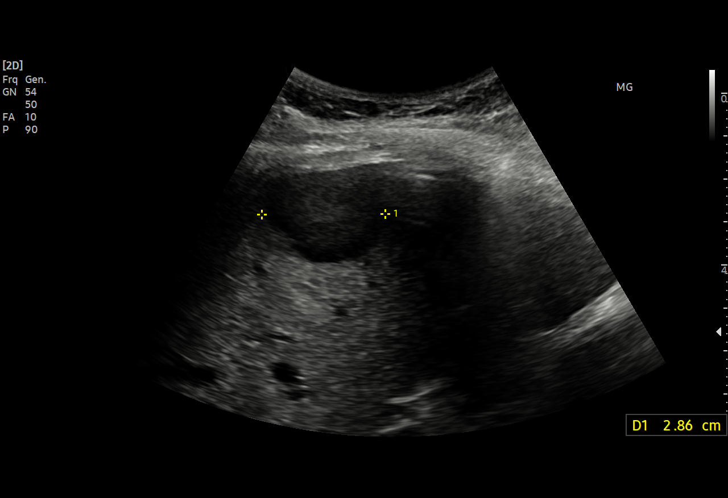
[im 7/11]
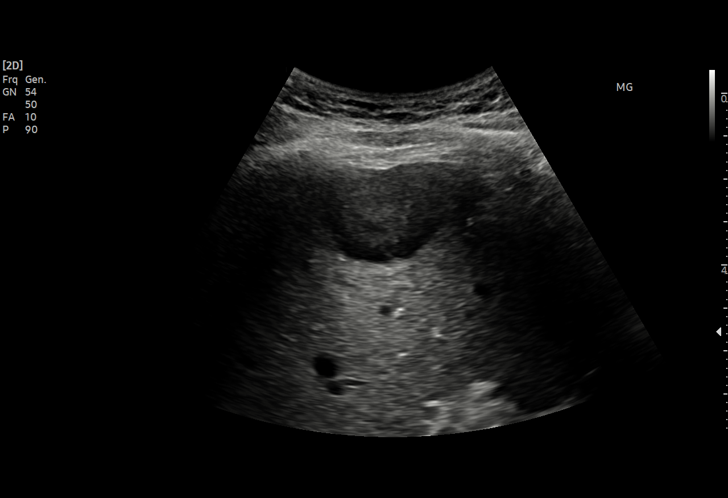
[im 8/11]
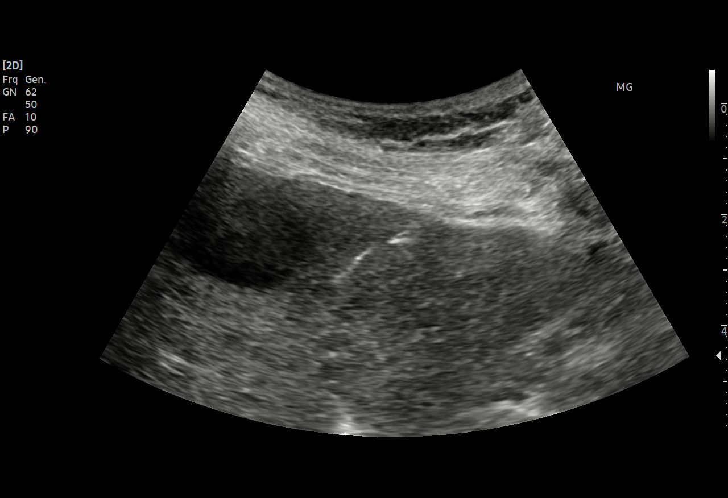
[im 9/11]
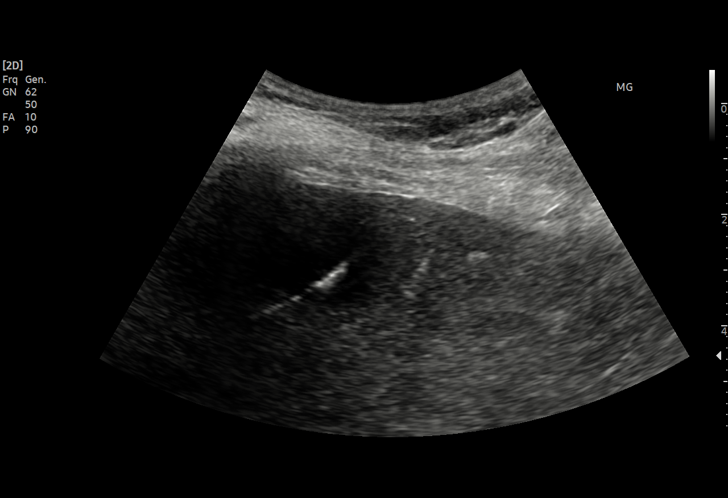
[im 10/11]
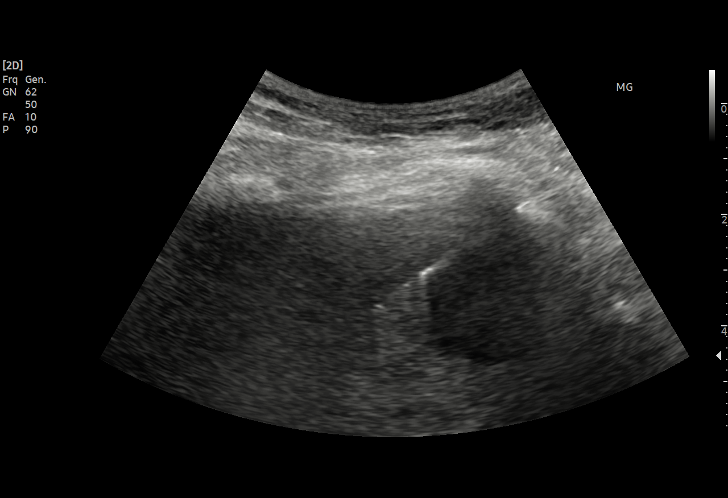
[im 11/11]
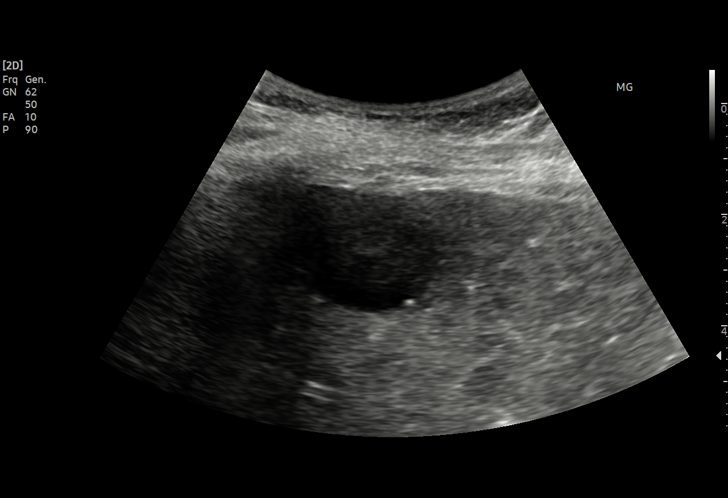

[11 of 11 positions shown; findings below may reference images not displayed]

MEDICATIONS:
None

ANESTHESIA/SEDATION:
Moderate (conscious) sedation was employed during this procedure as
administered by the [REDACTED].

A total of Versed 1 mg and Fentanyl 50 mcg was administered
intravenously.

Moderate Sedation Time: 10 minutes. The patient's level of
consciousness and vital signs were monitored continuously by
radiology nursing throughout the procedure under my direct
supervision.

COMPLICATIONS:
None immediate.

PROCEDURE:
Informed written consent was obtained from the patient after a
discussion of the risks, benefits and alternatives to treatment. The
patient understands and consents the procedure. A timeout was
performed prior to the initiation of the procedure.

Ultrasound scanning was performed of the right upper abdominal
quadrant demonstrates an approximately 2.6 x 2.3 x 1.9 cm hypoechoic
lesion within the subcapsular aspect of the right lobe of the liver
(images 2 - 8), likely increased in size compared to the CT abdomen
and pelvis performed [DATE], previously, 2.2 x 2.2 cm. The
procedure was planned.

The right upper abdominal quadrant was prepped and draped in the
usual sterile fashion. The overlying soft tissues were anesthetized
with 1% lidocaine with epinephrine.

Next, utilizing a 20 gauge needle, echogenic and radiopaque fiducial
markers were placed under direct ultrasound guidance marking the
inferior (marker #1, image 9), superomedial (marker #2, image 10),
superior (marker #3, image 11) and inferomedial (marker #4, image
12) aspects of the lesion. Multiple ultrasound images were saved
procedural documentation purposes.

Post procedural scanning was negative for definitive area of
hemorrhage or additional complication. A dressing was applied. The
patient tolerated the procedure well without immediate post
procedural complication.
IMPRESSION: Technically successful ultrasound guided core fiducial marking of
hypoechoic lesion within the subcapsular aspect of the right lobe of
the liver.

## 2021-10-29 MED ORDER — LIDOCAINE HCL 1 % IJ SOLN
INTRAMUSCULAR | Status: AC
  Filled 2021-10-29: qty 20

## 2021-10-29 MED ORDER — SODIUM CHLORIDE 0.9 % IV SOLN: INTRAVENOUS | Status: DC

## 2021-10-29 MED ORDER — FENTANYL CITRATE (PF) 100 MCG/2ML IJ SOLN
INTRAMUSCULAR | Status: AC | PRN
Start: 2021-10-29 — End: 2021-10-29
  Administered 2021-10-29: 50 ug via INTRAVENOUS

## 2021-10-29 MED ORDER — FENTANYL CITRATE (PF) 100 MCG/2ML IJ SOLN
INTRAMUSCULAR | Status: AC
  Filled 2021-10-29: qty 2

## 2021-10-29 MED ORDER — MIDAZOLAM HCL 2 MG/2ML IJ SOLN
INTRAMUSCULAR | Status: AC | PRN
  Administered 2021-10-29: 1 mg via INTRAVENOUS

## 2021-10-29 MED ORDER — MIDAZOLAM HCL 2 MG/2ML IJ SOLN
INTRAMUSCULAR | Status: AC
  Filled 2021-10-29: qty 2

## 2021-10-29 NOTE — Consult Note (Addendum)
Chief Complaint: Patient was seen in consultation today for image guided liver lesion fiducial marker placement   Referring Physician(s): Bruning,Ashlyn,PA-C/Vaslow,Z  Supervising Physician: Sandi Mariscal  Patient Status: [facility] OP  History of Present Illness: Marie Hensley is a 76 y.o. 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  Now with new brain lesions on recent imaging along with significant interval enlargement of mass within the anterior right lobe of the liver consistent with enlarging hepatic metastasis.  She presents today for image guided liver lesion  fiducial marker placement prior to planned SBRT.  Past Medical History:  Diagnosis Date   Arthritis    Brain tumor ([place])    Depression    Dyspnea    WITH EXERTION-SEEING DR [name]   HTN (hypertension)    Hyperlipemia    Seizures (HCC)     Past Surgical History:  Procedure Laterality Date   APPENDECTOMY  6073   APPLICATION OF CRANIAL NAVIGATION N/A 04/08/2021   Procedure: APPLICATION OF CRANIAL NAVIGATION;  Surgeon: Judith Part, MD;  Location: [facility];  Service: Neurosurgery;  Laterality: N/A;  RM 21   CRANIOTOMY Left 04/08/2021   Procedure: Left Craniotomy for tumor resection with brainlab;  Surgeon: Judith Part, MD;  Location: [facility];  Service: Neurosurgery;  Laterality: Left;   EYE SURGERY Bilateral    CATARACTS   OOPHORECTOMY Right    VIDEO BRONCHOSCOPY WITH ENDOBRONCHIAL NAVIGATION Left 12/23/2020   Procedure: VIDEO BRONCHOSCOPY WITH ENDOBRONCHIAL NAVIGATION;  Surgeon: Tyler Pita, MD;  Location: [facility];  Service: Pulmonary;  Laterality: Left;   VIDEO BRONCHOSCOPY WITH ENDOBRONCHIAL NAVIGATION Left 02/01/2021   Procedure: ROBOTIC ASSISTED VIDEO BRONCHOSCOPY WITH ENDOBRONCHIAL  NAVIGATION;  Surgeon: Tyler Pita, MD;  Location: [facility];  Service: Pulmonary;  Laterality: Left;   VIDEO BRONCHOSCOPY WITH ENDOBRONCHIAL ULTRASOUND Left 12/23/2020   Procedure: VIDEO BRONCHOSCOPY WITH ENDOBRONCHIAL ULTRASOUND;  Surgeon: Tyler Pita, MD;  Location: [facility];  Service: Pulmonary;  Laterality: Left;   VIDEO BRONCHOSCOPY WITH ENDOBRONCHIAL ULTRASOUND Left 02/01/2021   Procedure: ROBOTIC ASSITSTED VIDEO BRONCHOSCOPY WITH ENDOBRONCHIAL ULTRASOUND;  Surgeon: Tyler Pita, MD;  Location: [facility];  Service: Pulmonary;  Laterality: Left;    Allergies: Patient has no known allergies.  Medications: Prior to Admission medications   Medication Sig Start Date End Date Taking? Authorizing Provider  acetaminophen (TYLENOL) 500 MG tablet Take 1,000 mg by mouth every 6 (six) hours as needed for mild pain (or arthritis).   Yes [provider]  atorvastatin (LIPITOR) 40 MG tablet Take 1 tablet (40 mg total) by mouth at bedtime. 03/03/21  Yes [name], MD  cetirizine (ZYRTEC) 10 MG tablet Take 10 mg by mouth daily as needed for allergies or rhinitis.   Yes [provider]  citalopram (CELEXA) 20 MG tablet Take 1 tablet (20 mg total) by mouth at bedtime. For depression 08/10/21  Yes Pleas Koch, NP  dexamethasone (DECADRON) 1 MG tablet Take 1 tablet (1 mg total) by mouth daily. 07/02/21  Yes Vaslow, Acey Lav, MD  diphenoxylate-atropine (LOMOTIL) 2.5-0.025 MG tablet Take 1 tablet by mouth 4 (four) times daily as needed for diarrhea or loose stools. 07/03/21  Yes Curt Bears, MD  levETIRAcetam (KEPPRA) 500 MG tablet TAKE TWO TABLETS BY MOUTH TWICE A DAY 09/14/21  Yes Vaslow, Acey Lav, MD  loperamide (IMODIUM  A-D) 2 MG tablet Take 2 mg by mouth 2 (two) times daily as needed for diarrhea or loose stools.   Yes [provider]  fluticasone (FLONASE) 50 MCG/ACT nasal spray Place 1 spray into both nostrils daily as needed for allergies.     [provider]  lisinopril-hydrochlorothiazide (ZESTORETIC) 10-12.5 MG tablet Take 1 tablet by mouth daily. 11/19/20   [name], MD  methylPREDNISolone (MEDROL DOSEPAK) 4 MG TBPK tablet Use as instructed 10/21/21   Curt Bears, MD  osimertinib mesylate (TAGRISSO) 40 MG tablet Take 1 tablet (40 mg total) by mouth daily. 07/14/21   Curt Bears, MD  oxyCODONE (OXY IR/ROXICODONE) 5 MG immediate release tablet Take 1 tablet (5 mg total) by mouth 3 (three) times daily as needed for severe pain. 10/21/21   Curt Bears, MD  prochlorperazine (COMPAZINE) 10 MG tablet Take 1 tablet (10 mg total) by mouth every 6 (six) hours as needed for nausea or vomiting. 04/22/21   Curt Bears, MD  propranolol ER (INDERAL LA) 60 MG 24 hr capsule Take 1 capsule (60 mg total) by mouth at bedtime. 03/03/21   [name], MD     Family History  Problem Relation Age of Onset   Addison's disease Daughter    Hypertension Mother        died at 14   Alcohol abuse Father     Social History   Socioeconomic History   Marital status: Single    Spouse name: Not on file   Number of children: 3   Years of education: high school    Highest education level: Not on file  Occupational History   Occupation: retired  Tobacco Use   Smoking status: Former    Packs/day: 0.50    Years: 20.00    Pack years: 10.00    Types: Cigarettes    Quit date: 10/27/1989    Years since quitting: 32.0   Smokeless tobacco: Never   Tobacco comments:    Smoked off and on in 20 year period  Vaping Use   Vaping Use: Never used  Substance and Sexual Activity   Alcohol use: Not Currently    Comment: 3-4 times a week, 2oz liquor    Drug use: Never   Sexual activity: Not Currently    Birth control/protection: Post-menopausal  Other Topics Concern   Not on file  Social History Narrative   She has discussed advanced directives with her.  She would want CPR and intubation if chance of recovery.   11/19/20    From: [place], moved for work 1998   Living: alone   Work: retired Energy manager of [employer]: 3 children - [name], [name], [name] - 5 grandchildren, 1 great-grandchild      Enjoys: shop, casino, cruise travel      Exercise: not as much due to the fatigue   Diet: tries to eat healthy, but eats what she wants      Safety   Seat belts: Yes    Guns: No   Safe in relationships: Yes    Social Determinants of Radio broadcast assistant Strain: Low Risk    Difficulty of Paying Living Expenses: Not hard at all  Food Insecurity: No Food Insecurity   Worried About Charity fundraiser in the Last Year: Never true   Arboriculturist in the Last Year: Never true  Transportation Needs: No Transportation Needs   Lack of Transportation (  Medical): No   Lack of Transportation (Non-Medical): No  Physical Activity: Insufficiently Active   Days of Exercise per Week: 3 days   Minutes of Exercise per Session: 20 min  Stress: No Stress Concern Present   Feeling of Stress : Not at all  Social Connections: Socially Isolated   Frequency of Communication with Friends and Family: More than three times a week   Frequency of Social Gatherings with Friends and Family: Three times a week   Attends Religious Services: Never   Active Member of Clubs or Organizations: No   Attends Archivist Meetings: Never   Marital Status: Never married      Review of Systems currently denies fever, headache, chest pain, dyspnea, cough, abdominal/back pain, nausea, vomiting or bleeding  Vital Signs: BP (!) 149/84    Pulse 87    Temp 98.3 F (36.8 C) (Oral)    Resp 16    SpO2 95%   Physical Exam awake, alert.  Chest clear to auscultation bilaterally.  Heart with regular rate and rhythm.  Abdomen soft, positive bowel sounds, nontender.  No lower extremity edema  Imaging: MR BRAIN W WO CONTRAST  Result Date: 10/08/2021 CLINICAL DATA:  Metastatic lung cancer.  Headaches. EXAM: MRI HEAD WITHOUT AND WITH  CONTRAST TECHNIQUE: Multiplanar, multiecho pulse sequences of the brain and surrounding structures were obtained without and with intravenous contrast. CONTRAST:  68mL GADAVIST GADOBUTROL 1 MMOL/ML IV SOLN COMPARISON:  07/08/2021 FINDINGS: Brain: New lesions: 1. 6 mm ring-enhancing lesion anteriorly in the right cerebellum (series 1034, image 36). 2. 5 mm enhancing lesion in the right parietal lobe (series 1034, image 120). 3. 12 mm enhancing lesion in the right frontal lobe (series 1034, image 128). 4. 4 mm enhancing lesion in the left occipital lobe (series 1034, image 65). 5. 1 mm enhancing lesion in the superior aspect of the left occipital lobe (series 1034, image 83). 6. 5 mm enhancing lesion in the left precentral gyrus (series 1034, image 135). Larger lesions: None. Stable or smaller lesions: Decreased enhancement and edema the left frontal resection site. Other brain findings: There is mild edema associated with some of the new brain metastases, most notably the 12 mm right frontal metastasis. There is no associated mass effect. No acute infarct, midline shift, or extra-axial fluid collection is evident. The ventricles are normal in size. Vascular: Major intracranial vascular flow voids are preserved. Skull and upper cervical spine: Unremarkable bone marrow signal. Sinuses/Orbits: Persistent prominent mucosal thickening in the right sphenoid sinus. Clear mastoid air cells. Bilateral cataract extraction. Other: None. IMPRESSION: 1. 6 new brain metastases with up to mild edema. 2. Decreased enhancement and edema at the left frontal resection site. Electronically Signed   By: Logan Bores M.D.   On: 10/08/2021 20:48    Labs:  CBC: Recent Labs    07/13/21 0934 07/29/21 1103 08/23/21 0956 10/04/21 1113  WBC 6.7 9.1 7.2 6.5  HGB 11.3* 12.4 12.1 12.3  HCT 33.4* 40.2 38.4 39.1  PLT 225 220 163 163    COAGS: Recent Labs    02/01/21 1119 03/04/21 1320  INR 1.0 1.0  APTT 27 24    BMP: Recent  Labs    07/13/21 0934 07/29/21 1103 08/23/21 0956 10/04/21 1113  NA 137 140 139 140  K 2.2* 4.2 3.8 3.7  CL 110 103 106 106  CO2 20* 27 25 27   GLUCOSE 120* 96 106* 75  BUN 26* 19 24* 24*  CALCIUM 9.0 9.4 8.9 9.5  CREATININE 1.93* 1.27* 1.29* 1.46*  GFRNONAA 27* 44* 43* 37*    LIVER FUNCTION TESTS: Recent Labs    07/13/21 0934 07/29/21 1103 08/23/21 0956 10/04/21 1113  BILITOT 0.4 0.4 0.4 0.4  AST 36 24 18 21   ALT 48* 32 23 27  ALKPHOS 49 56 48 47  PROT 6.5 7.1 6.6 6.8  ALBUMIN 3.4* 3.7 3.6 4.0    TUMOR MARKERS: No results for input(s): AFPTM, CEA, CA199, CHROMGRNA in the last 8760 hours.  Assessment and Plan: 76 y.o. 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  Now with new brain lesions on recent imaging along with significant interval enlargement of mass within the anterior right lobe of the liver consistent with enlarging hepatic metastasis.  She presents today for image guided liver lesion  fiducial marker placement prior to planned SBRT.Risks and benefits of procedure was discussed with the patient  including, but not limited to bleeding, infection, damage to adjacent structures or low yield requiring additional tests.  All of the questions were answered and there is agreement to proceed.  Consent signed and in chart.    Thank you for this interesting consult.  I greatly enjoyed meeting TERRIONA HORLACHER and look forward to participating in their care.  A copy of this report was sent to the requesting provider on this date.  Electronically Signed: D. Rowe Robert, PA-C 10/29/2021, 12:07 PM   I spent a total of  25 minutes   in face to face in clinical consultation, greater than 50% of which was counseling/coordinating care for image guided liver lesion  fiducial marker placement

## 2021-10-29 NOTE — Procedures (Signed)
Pre Procedure Dx: Liver lesion Post Procedural Dx: Same  Technically successful US guided fiducial marking of lesion within the subcapsular aspect of the right lobe of the liver.   EBL: None  No immediate complications.   Ronny Bacon, MD Pager #: [phone]

## 2021-11-01 ENCOUNTER — Telehealth: Payer: Self-pay

## 2021-11-01 ENCOUNTER — Other Ambulatory Visit: Payer: Medicare HMO

## 2021-11-01 ENCOUNTER — Other Ambulatory Visit: Payer: Self-pay

## 2021-11-01 ENCOUNTER — Ambulatory Visit
Admission: RE | Admit: 2021-11-01 | Discharge: 2021-11-01 | Disposition: A | Discharge status: Home / Self Care | Payer: Medicare HMO | Source: Ambulatory Visit | Attending: Radiation Oncology | Admitting: Radiation Oncology

## 2021-11-01 ENCOUNTER — Ambulatory Visit: Payer: Medicare HMO | Admitting: Internal Medicine

## 2021-11-01 ENCOUNTER — Ambulatory Visit: Payer: Medicare HMO | Admitting: Radiation Oncology

## 2021-11-01 DIAGNOSIS — C3490 Malignant neoplasm of unspecified part of unspecified bronchus or lung: Secondary | ICD-10-CM | POA: Diagnosis not present

## 2021-11-01 DIAGNOSIS — C3492 Malignant neoplasm of unspecified part of left bronchus or lung: Secondary | ICD-10-CM | POA: Diagnosis not present

## 2021-11-01 DIAGNOSIS — C787 Secondary malignant neoplasm of liver and intrahepatic bile duct: Secondary | ICD-10-CM | POA: Diagnosis not present

## 2021-11-01 DIAGNOSIS — C7931 Secondary malignant neoplasm of brain: Secondary | ICD-10-CM | POA: Diagnosis not present

## 2021-11-01 DIAGNOSIS — C3412 Malignant neoplasm of upper lobe, left bronchus or lung: Secondary | ICD-10-CM | POA: Diagnosis not present

## 2021-11-01 NOTE — Progress Notes (Signed)
°  Radiation Oncology         [phone]) [phone] ________________________________  Name: Marie Hensley MRN: [id number]  Date: 11/01/2021  DOB: 05-Aug-1946  STEREOTACTIC BODY RADIOTHERAPY SIMULATION AND TREATMENT PLANNING NOTE    ICD-10-CM   1. Non-small cell lung cancer metastatic to liver ([place])  C34.90    C78.7       DIAGNOSIS:  76 yo woman with a solitary liver metastasis from cancer of the left upper lung  NARRATIVE:  The patient was brought to the [facility].  Identity was confirmed.  All relevant records and images related to the planned course of therapy were reviewed.  The patient freely provided informed written consent to proceed with treatment after reviewing the details related to the planned course of therapy. The consent form was witnessed and verified by the simulation staff.  Then, the patient was set-up in a stable reproducible  supine position for radiation therapy.  A BodyFix immobilization pillow was fabricated for reproducible positioning.  Surface markings were placed.  The CT images were loaded into the planning software.  The gross target volumes (GTV) and planning target volumes (PTV) were delinieated, and avoidance structures were contoured.  Treatment planning then occurred.  The radiation prescription was entered and confirmed.  A total of two complex treatment devices were fabricated in the form of the BodyFix immobilization pillow and a neck accuform cushion.  I have requested : 3D Simulation  I have requested a DVH of the following structures: targets and all normal structures near the target including right lung, spinal cord, right kidney, uninvolved liver and PTV as noted on the radiation plan to maintain doses in adherence with established limits  RESPIRATORY MOTION MANAGEMENT SIMULATION:  In order to account for effect of respiratory motion on target structures and other organs in the planning and delivery of radiotherapy, this patient underwent  respiratory motion management simulation.  To accomplish this, when the patient was brought to the CT simulation planning suite, 4D respiratoy motion management CT images were obtained.  The CT images were loaded into the planning software.  Then, using a variety of tools including Cine, MIP, and standard views, the target volume and planning target volumes (PTV) were delineated.  Avoidance structures were contoured.  Treatment planning then occurred.  Dose volume histograms were generated and reviewed for each of the requested structure.  The resulting plan was carefully reviewed and approved today.  SPECIAL TREATMENT PROCEDURE:  The planned course of therapy using radiation constitutes a special treatment procedure. Special care is required in the management of this patient for the following reasons. High dose per fraction requiring special monitoring for increased toxicities of treatment including daily imaging..  The special nature of the planned course of radiotherapy will require increased physician supervision and oversight to ensure patient's safety with optimal treatment outcomes.    This requires extended time and effort.    PLAN:  The patient will receive 50 Gy in 5 fractions  ________________________________  Sheral Apley. Tammi Klippel, M.D.

## 2021-11-01 NOTE — Telephone Encounter (Signed)
Pts daughter, [name], called requesting a refill of Oxycodone for the pt.

## 2021-11-02 ENCOUNTER — Other Ambulatory Visit: Payer: Self-pay | Admitting: Internal Medicine

## 2021-11-02 MED ORDER — OXYCODONE HCL 5 MG PO TABS: 5.0000 mg | ORAL_TABLET | Freq: Three times a day (TID) | ORAL | 0 refills | Status: AC | PRN

## 2021-11-03 ENCOUNTER — Inpatient Hospital Stay: Payer: Medicare HMO

## 2021-11-03 ENCOUNTER — Inpatient Hospital Stay: Payer: Medicare HMO | Admitting: Internal Medicine

## 2021-11-03 ENCOUNTER — Ambulatory Visit: Payer: Medicare HMO | Admitting: Radiation Oncology

## 2021-11-03 ENCOUNTER — Ambulatory Visit: Payer: Medicare HMO | Admitting: Internal Medicine

## 2021-11-03 ENCOUNTER — Other Ambulatory Visit: Payer: Medicare HMO

## 2021-11-04 ENCOUNTER — Encounter: Payer: Medicare HMO | Admitting: Family Medicine

## 2021-11-04 DIAGNOSIS — C787 Secondary malignant neoplasm of liver and intrahepatic bile duct: Secondary | ICD-10-CM | POA: Diagnosis not present

## 2021-11-04 DIAGNOSIS — C3492 Malignant neoplasm of unspecified part of left bronchus or lung: Secondary | ICD-10-CM | POA: Diagnosis not present

## 2021-11-05 ENCOUNTER — Ambulatory Visit: Payer: Medicare HMO | Admitting: Radiation Oncology

## 2021-11-10 ENCOUNTER — Other Ambulatory Visit: Payer: Self-pay

## 2021-11-10 ENCOUNTER — Ambulatory Visit
Admission: RE | Admit: 2021-11-10 | Discharge: 2021-11-10 | Disposition: A | Discharge status: Home / Self Care | Payer: Medicare HMO | Source: Ambulatory Visit | Attending: Urology | Admitting: Urology

## 2021-11-10 DIAGNOSIS — C3490 Malignant neoplasm of unspecified part of unspecified bronchus or lung: Secondary | ICD-10-CM | POA: Diagnosis present

## 2021-11-10 DIAGNOSIS — C787 Secondary malignant neoplasm of liver and intrahepatic bile duct: Secondary | ICD-10-CM | POA: Diagnosis present

## 2021-11-11 ENCOUNTER — Inpatient Hospital Stay: Payer: Medicare HMO | Admitting: Internal Medicine

## 2021-11-11 ENCOUNTER — Inpatient Hospital Stay: Payer: Medicare HMO | Attending: Internal Medicine

## 2021-11-11 ENCOUNTER — Ambulatory Visit: Payer: Medicare HMO

## 2021-11-11 VITALS — BP 121/75 | HR 84 | Temp 97.8°F | Resp 19 | Ht 64.0 in | Wt 161.8 lb

## 2021-11-11 DIAGNOSIS — C7931 Secondary malignant neoplasm of brain: Secondary | ICD-10-CM | POA: Insufficient documentation

## 2021-11-11 DIAGNOSIS — C787 Secondary malignant neoplasm of liver and intrahepatic bile duct: Secondary | ICD-10-CM | POA: Insufficient documentation

## 2021-11-11 DIAGNOSIS — E785 Hyperlipidemia, unspecified: Secondary | ICD-10-CM | POA: Diagnosis not present

## 2021-11-11 DIAGNOSIS — C3412 Malignant neoplasm of upper lobe, left bronchus or lung: Secondary | ICD-10-CM | POA: Diagnosis not present

## 2021-11-11 DIAGNOSIS — C3490 Malignant neoplasm of unspecified part of unspecified bronchus or lung: Secondary | ICD-10-CM

## 2021-11-11 DIAGNOSIS — Z79899 Other long term (current) drug therapy: Secondary | ICD-10-CM | POA: Diagnosis not present

## 2021-11-11 DIAGNOSIS — C771 Secondary and unspecified malignant neoplasm of intrathoracic lymph nodes: Secondary | ICD-10-CM | POA: Insufficient documentation

## 2021-11-11 DIAGNOSIS — Z923 Personal history of irradiation: Secondary | ICD-10-CM | POA: Insufficient documentation

## 2021-11-11 DIAGNOSIS — Z5111 Encounter for antineoplastic chemotherapy: Secondary | ICD-10-CM

## 2021-11-11 DIAGNOSIS — I10 Essential (primary) hypertension: Secondary | ICD-10-CM | POA: Insufficient documentation

## 2021-11-11 LAB — CBC WITH DIFFERENTIAL (CANCER CENTER ONLY)
Abs Immature Granulocytes: 0.07 10*3/uL (ref 0.00–0.07)
Basophils Absolute: 0 10*3/uL (ref 0.0–0.1)
Basophils Relative: 1 %
Eosinophils Absolute: 0.2 10*3/uL (ref 0.0–0.5)
Eosinophils Relative: 3 %
HCT: 38.9 % (ref 36.0–46.0)
Hemoglobin: 12.5 g/dL (ref 12.0–15.0)
Immature Granulocytes: 1 %
Lymphocytes Relative: 25 %
Lymphs Abs: 1.6 10*3/uL (ref 0.7–4.0)
MCH: 29.7 pg (ref 26.0–34.0)
MCHC: 32.1 g/dL (ref 30.0–36.0)
MCV: 92.4 fL (ref 80.0–100.0)
Monocytes Absolute: 0.6 10*3/uL (ref 0.1–1.0)
Monocytes Relative: 9 %
Neutro Abs: 3.8 10*3/uL (ref 1.7–7.7)
Neutrophils Relative %: 61 %
Platelet Count: 139 10*3/uL — ABNORMAL LOW (ref 150–400)
RBC: 4.21 MIL/uL (ref 3.87–5.11)
RDW: 13.5 % (ref 11.5–15.5)
WBC Count: 6.2 10*3/uL (ref 4.0–10.5)
nRBC: 0 % (ref 0.0–0.2)

## 2021-11-11 LAB — CMP (CANCER CENTER ONLY)
ALT: 27 U/L (ref 0–44)
AST: 21 U/L (ref 15–41)
Albumin: 3.8 g/dL (ref 3.5–5.0)
Alkaline Phosphatase: 55 U/L (ref 38–126)
Anion gap: 6 (ref 5–15)
BUN: 27 mg/dL — ABNORMAL HIGH (ref 8–23)
CO2: 28 mmol/L (ref 22–32)
Calcium: 9.4 mg/dL (ref 8.9–10.3)
Chloride: 104 mmol/L (ref 98–111)
Creatinine: 1.41 mg/dL — ABNORMAL HIGH (ref 0.44–1.00)
GFR, Estimated: 38 mL/min — ABNORMAL LOW (ref >60)
Glucose, Bld: 108 mg/dL — ABNORMAL HIGH (ref 70–99)
Potassium: 3.7 mmol/L (ref 3.5–5.1)
Sodium: 138 mmol/L (ref 135–145)
Total Bilirubin: 0.6 mg/dL (ref 0.3–1.2)
Total Protein: 6.7 g/dL (ref 6.5–8.1)

## 2021-11-11 NOTE — Progress Notes (Signed)
[place] [phone]   [phone]  OFFICE PROGRESS NOTE  [name], MD [place] [address]  DIAGNOSIS: Stage IV (T1c, N1, M1c) non-small cell lung cancer, adenocarcinoma presented with locally advanced disease in the left lung  with a left upper lobe lung nodule in addition to left hilar adenopathy and a solitary left frontal brain metastasis s/p resection of the brain tumor with SRS.  This was diagnosed in July 2022.   Biomarker Findings Tumor Mutational Burden - 43 Muts/Mb Microsatellite status - MS-Stable Genomic Findings For a complete list of the genes assayed, please refer to the Appendix. EGFR L861Q MTAP loss CDKN2A/B CDKN2A loss, CDKN2B loss CUL4A amplification - equivocal IRS2 amplification SF3B1 K666N TERT promoter -124C>T TP53 N247I 7 Disease relevant genes with no reportable alterations: ALK, BRAF, ERBB2, KRAS, MET, RET, ROS1   PDL1 Expression 95%   PRIOR THERAPY:  1) Resection followed by [facility] of the solitary frontal lobe brain metastasis on July 28th, 2022  2) SRS to new brain metastasis under the care of Dr. Tammi Klippel. 3) SBRT to enlarging right liver lesion under the care of Dr. Tammi Klippel   CURRENT THERAPY: Targeted treatment with Tagrisso 80 mg p.o. daily. First dose 05/27/21.  This was reduced to 40 mg p.o. daily on July 14, 2021 status post 4 months of treatment  INTERVAL HISTORY: Marie Hensley 76 y.o. female returns to the clinic today for follow-up visit accompanied by her daughter.  The patient is feeling fine today with no concerning complaints except for occasional pain in the right upper quadrant.  She is currently undergoing SRS to the enlarging right liver lesion.  She received 1 fraction of radiotherapy so far.  She denied having any current chest pain, shortness of breath, cough or hemoptysis.  She denied having any fever or chills.  She has no nausea, vomiting, diarrhea or  constipation.  She has no headache or visual changes.  She denied having any weight loss or night sweats.  She has some itching in the lower extremities especially the feet.  She is applying calamine lotion with some improvement.  The patient is here today for evaluation and repeat blood work.  MEDICAL HISTORY: Past Medical History:  Diagnosis Date   Arthritis    Brain tumor ([place])    Depression    Dyspnea    WITH EXERTION-SEEING DR [name]   HTN (hypertension)    Hyperlipemia    Seizures ([facility])     ALLERGIES:  has No Known Allergies.  MEDICATIONS:  Current Outpatient Medications  Medication Sig Dispense Refill   acetaminophen (TYLENOL) 500 MG tablet Take 1,000 mg by mouth every 6 (six) hours as needed for mild pain (or arthritis).     atorvastatin (LIPITOR) 40 MG tablet Take 1 tablet (40 mg total) by mouth at bedtime. 90 tablet 3   cetirizine (ZYRTEC) 10 MG tablet Take 10 mg by mouth daily as needed for allergies or rhinitis.     citalopram (CELEXA) 20 MG tablet Take 1 tablet (20 mg total) by mouth at bedtime. For depression 90 tablet 0   dexamethasone (DECADRON) 1 MG tablet Take 1 tablet (1 mg total) by mouth daily. 30 tablet 3   diphenoxylate-atropine (LOMOTIL) 2.5-0.025 MG tablet Take 1 tablet by mouth 4 (four) times daily as needed for diarrhea or loose stools. 30 tablet 0   fluticasone (FLONASE) 50 MCG/ACT nasal spray Place 1 spray into both nostrils daily  as needed for allergies.     levETIRAcetam (KEPPRA) 500 MG tablet TAKE TWO TABLETS BY MOUTH TWICE A DAY 120 tablet 3   lisinopril-hydrochlorothiazide (ZESTORETIC) 10-12.5 MG tablet Take 1 tablet by mouth daily. 90 tablet 3   loperamide (IMODIUM A-D) 2 MG tablet Take 2 mg by mouth 2 (two) times daily as needed for diarrhea or loose stools.     methylPREDNISolone (MEDROL DOSEPAK) 4 MG TBPK tablet Use as instructed 21 tablet 0   osimertinib mesylate (TAGRISSO) 40 MG tablet Take 1 tablet (40 mg total) by mouth daily. 30 tablet 2    oxyCODONE (OXY IR/ROXICODONE) 5 MG immediate release tablet Take 1 tablet (5 mg total) by mouth 3 (three) times daily as needed for severe pain. 20 tablet 0   prochlorperazine (COMPAZINE) 10 MG tablet Take 1 tablet (10 mg total) by mouth every 6 (six) hours as needed for nausea or vomiting. 30 tablet 0   propranolol ER (INDERAL LA) 60 MG 24 hr capsule Take 1 capsule (60 mg total) by mouth at bedtime. 90 capsule 3   No current facility-administered medications for this visit.    SURGICAL HISTORY:  Past Surgical History:  Procedure Laterality Date   APPENDECTOMY  1505   APPLICATION OF CRANIAL NAVIGATION N/A 04/08/2021   Procedure: APPLICATION OF CRANIAL NAVIGATION;  Surgeon: Judith Part, MD;  Location: [place];  Service: Neurosurgery;  Laterality: N/A;  RM 21   CRANIOTOMY Left 04/08/2021   Procedure: Left Craniotomy for tumor resection with brainlab;  Surgeon: Judith Part, MD;  Location: [place];  Service: Neurosurgery;  Laterality: Left;   EYE SURGERY Bilateral    CATARACTS   OOPHORECTOMY Right    VIDEO BRONCHOSCOPY WITH ENDOBRONCHIAL NAVIGATION Left 12/23/2020   Procedure: VIDEO BRONCHOSCOPY WITH ENDOBRONCHIAL NAVIGATION;  Surgeon: Tyler Pita, MD;  Location: [facility];  Service: Pulmonary;  Laterality: Left;   VIDEO BRONCHOSCOPY WITH ENDOBRONCHIAL NAVIGATION Left 02/01/2021   Procedure: ROBOTIC ASSISTED VIDEO BRONCHOSCOPY WITH ENDOBRONCHIAL NAVIGATION;  Surgeon: Tyler Pita, MD;  Location: [facility];  Service: Pulmonary;  Laterality: Left;   VIDEO BRONCHOSCOPY WITH ENDOBRONCHIAL ULTRASOUND Left 12/23/2020   Procedure: VIDEO BRONCHOSCOPY WITH ENDOBRONCHIAL ULTRASOUND;  Surgeon: Tyler Pita, MD;  Location: [facility];  Service: Pulmonary;  Laterality: Left;   VIDEO BRONCHOSCOPY WITH ENDOBRONCHIAL ULTRASOUND Left 02/01/2021   Procedure: ROBOTIC ASSITSTED VIDEO BRONCHOSCOPY WITH ENDOBRONCHIAL ULTRASOUND;  Surgeon: Tyler Pita, MD;  Location: [facility];  Service:  Pulmonary;  Laterality: Left;    REVIEW OF SYSTEMS:  A comprehensive review of systems was negative except for: Constitutional: positive for fatigue Gastrointestinal: positive for abdominal pain Integument/breast: positive for pruritus   PHYSICAL EXAMINATION: General appearance: alert, cooperative, fatigued, and no distress Head: Normocephalic, without obvious abnormality, atraumatic Neck: no adenopathy, no JVD, supple, symmetrical, trachea midline, and thyroid not enlarged, symmetric, no tenderness/mass/nodules Lymph nodes: Cervical, supraclavicular, and axillary nodes normal. Resp: clear to auscultation bilaterally Back: symmetric, no curvature. ROM normal. No CVA tenderness. Cardio: regular rate and rhythm, S1, S2 normal, no murmur, click, rub or gallop GI: soft, non-tender; bowel sounds normal; no masses,  no organomegaly Extremities: extremities normal, atraumatic, no cyanosis or edema  ECOG PERFORMANCE STATUS: 1 - Symptomatic but completely ambulatory  Blood pressure 121/75, pulse 84, temperature 97.8 F (36.6 C), temperature source Tympanic, resp. rate 19, height _0  (1.626 m), weight 161 lb 12.8 oz (73.4 kg), SpO2 96 %.  LABORATORY DATA: Lab Results  Component Value Date   WBC 6.2  11/11/2021   HGB 12.5 11/11/2021   HCT 38.9 11/11/2021   MCV 92.4 11/11/2021   PLT 139 (L) 11/11/2021      Chemistry      Component Value Date/Time   NA 138 11/11/2021 1244   K 3.7 11/11/2021 1244   CL 104 11/11/2021 1244   CO2 28 11/11/2021 1244   BUN 27 (H) 11/11/2021 1244   CREATININE 1.41 (H) 11/11/2021 1244      Component Value Date/Time   CALCIUM 9.4 11/11/2021 1244   ALKPHOS 55 11/11/2021 1244   AST 21 11/11/2021 1244   ALT 27 11/11/2021 1244   BILITOT 0.6 11/11/2021 1244       RADIOGRAPHIC STUDIES: [name] CORE BIOPSY (LIVER)  Result Date: 10/29/2021 INDICATION: Concern for metastatic lung cancer. Please perform ultrasound-guided fiducial marking of solitary lesion within  the subcapsular aspect of the anterior segment of the right lobe of the liver prior to external beam radiation. EXAM: ULTRASOUND-GUIDED LIVER LESION FIDUCIAL MARKING. COMPARISON:  None. MEDICATIONS: None ANESTHESIA/SEDATION: Moderate (conscious) sedation was employed during this procedure as administered by the Interventional Radiology RN. A total of Versed 1 mg and Fentanyl 50 mcg was administered intravenously. Moderate Sedation Time: 10 minutes. The patient's level of consciousness and vital signs were monitored continuously by radiology nursing throughout the procedure under my direct supervision. COMPLICATIONS: None immediate. PROCEDURE: Informed written consent was obtained from the patient after a discussion of the risks, benefits and alternatives to treatment. The patient understands and consents the procedure. A timeout was performed prior to the initiation of the procedure. Ultrasound scanning was performed of the right upper abdominal quadrant demonstrates an approximately 2.6 x 2.3 x 1.9 cm hypoechoic lesion within the subcapsular aspect of the right lobe of the liver (images 2 - 8), likely increased in size compared to the CT abdomen and pelvis performed 09/21/2021, previously, 2.2 x 2.2 cm. The procedure was planned. The right upper abdominal quadrant was prepped and draped in the usual sterile fashion. The overlying soft tissues were anesthetized with 1% lidocaine with epinephrine. Next, utilizing a 20 gauge needle, echogenic and radiopaque fiducial markers were placed under direct ultrasound guidance marking the inferior (marker #1, image 9), superomedial (marker #2, image 10), superior (marker #3, image 11) and inferomedial (marker #4, image 12) aspects of the lesion. Multiple ultrasound images were saved procedural documentation purposes. Post procedural scanning was negative for definitive area of hemorrhage or additional complication. A dressing was applied. The patient tolerated the procedure  well without immediate post procedural complication. IMPRESSION: Technically successful ultrasound guided core fiducial marking of hypoechoic lesion within the subcapsular aspect of the right lobe of the liver. Electronically Signed   By: Sandi Mariscal M.D.   On: 10/29/2021 16:18    ASSESSMENT AND PLAN: This is a very pleasant 76 years old white female diagnosed with a stage IV (T1c, N1, M1 C) non-small cell lung cancer, adenocarcinoma with positive EGFR mutation (N989Q) presented with locally advanced disease in the left lung including left upper lobe lung nodule in addition to left hilar adenopathy and solitary left frontal brain metastasis s/p resection followed by SRS diagnosed in July 2022. The patient is currently treatment with Tagrisso 80 mg p.o. daily.  Her dose of Tagrisso was reduced to 40 mg p.o. daily on July 14, 2021 secondary to severe diarrhea that was not responding to Imodium or Lomotil. The patient is status post 4 months of treatment with the reduced dose Tagrisso and tolerating it much better except  for some pruritus especially in the feet. I recommended for her to continue her current treatment with Tagrisso with the same dose. For the enlarging liver lesion, she is undergoing SBRT under the care of Dr. Tammi Klippel. I will see the patient back for follow-up visit in 1 months for evaluation with repeat CT scan of the chest, abdomen and pelvis for restaging of her disease.  The patient was advised to call immediately if she has any other concerning symptoms in the interval. The patient voices understanding of current disease status and treatment options and is in agreement with the current care plan.  All questions were answered. The patient knows to call the clinic with any problems, questions or concerns. We can certainly see the patient much sooner if necessary.  The total time spent in the appointment was 20 minutes.  Disclaimer: This note was dictated with voice recognition  software. Similar sounding words can inadvertently be transcribed and may not be corrected upon review.

## 2021-11-12 ENCOUNTER — Ambulatory Visit
Admission: RE | Admit: 2021-11-12 | Discharge: 2021-11-12 | Disposition: A | Discharge status: Home / Self Care | Payer: Medicare HMO | Source: Ambulatory Visit | Attending: Radiation Oncology | Admitting: Radiation Oncology

## 2021-11-12 ENCOUNTER — Other Ambulatory Visit: Payer: Self-pay

## 2021-11-12 DIAGNOSIS — C3490 Malignant neoplasm of unspecified part of unspecified bronchus or lung: Secondary | ICD-10-CM | POA: Diagnosis not present

## 2021-11-12 DIAGNOSIS — C787 Secondary malignant neoplasm of liver and intrahepatic bile duct: Secondary | ICD-10-CM | POA: Diagnosis not present

## 2021-11-15 ENCOUNTER — Other Ambulatory Visit: Payer: Self-pay

## 2021-11-15 ENCOUNTER — Ambulatory Visit
Admission: RE | Admit: 2021-11-15 | Discharge: 2021-11-15 | Disposition: A | Discharge status: Home / Self Care | Payer: Medicare HMO | Source: Ambulatory Visit | Attending: Radiation Oncology | Admitting: Radiation Oncology

## 2021-11-15 DIAGNOSIS — C787 Secondary malignant neoplasm of liver and intrahepatic bile duct: Secondary | ICD-10-CM

## 2021-11-15 DIAGNOSIS — C3490 Malignant neoplasm of unspecified part of unspecified bronchus or lung: Secondary | ICD-10-CM | POA: Diagnosis not present

## 2021-11-16 ENCOUNTER — Telehealth: Payer: Self-pay

## 2021-11-16 ENCOUNTER — Ambulatory Visit: Payer: Medicare HMO

## 2021-11-16 ENCOUNTER — Other Ambulatory Visit: Payer: Self-pay | Admitting: Primary Care

## 2021-11-16 DIAGNOSIS — F32A Depression, unspecified: Secondary | ICD-10-CM

## 2021-11-16 MED ORDER — CITALOPRAM HYDROBROMIDE 20 MG PO TABS: 20.0000 mg | ORAL_TABLET | Freq: Every day | ORAL | 0 refills | Status: DC

## 2021-11-16 NOTE — Telephone Encounter (Signed)
Pt needs a refill of citalopram sent to [facility]. Pt has appt 01-03-22. ?

## 2021-11-16 NOTE — Telephone Encounter (Signed)
Refill sent as requested. 

## 2021-11-17 ENCOUNTER — Other Ambulatory Visit: Payer: Self-pay

## 2021-11-17 ENCOUNTER — Ambulatory Visit
Admission: RE | Admit: 2021-11-17 | Discharge: 2021-11-17 | Disposition: A | Discharge status: Home / Self Care | Payer: Medicare HMO | Source: Ambulatory Visit | Attending: Radiation Oncology | Admitting: Radiation Oncology

## 2021-11-17 DIAGNOSIS — C3490 Malignant neoplasm of unspecified part of unspecified bronchus or lung: Secondary | ICD-10-CM | POA: Diagnosis not present

## 2021-11-17 DIAGNOSIS — C787 Secondary malignant neoplasm of liver and intrahepatic bile duct: Secondary | ICD-10-CM | POA: Diagnosis not present

## 2021-11-19 ENCOUNTER — Other Ambulatory Visit: Payer: Self-pay

## 2021-11-19 ENCOUNTER — Encounter: Payer: Self-pay | Admitting: Urology

## 2021-11-19 ENCOUNTER — Ambulatory Visit
Admission: RE | Admit: 2021-11-19 | Discharge: 2021-11-19 | Disposition: A | Discharge status: Home / Self Care | Payer: Medicare HMO | Source: Ambulatory Visit | Attending: Radiation Oncology | Admitting: Radiation Oncology

## 2021-11-19 DIAGNOSIS — C787 Secondary malignant neoplasm of liver and intrahepatic bile duct: Secondary | ICD-10-CM | POA: Diagnosis not present

## 2021-11-19 DIAGNOSIS — C3492 Malignant neoplasm of unspecified part of left bronchus or lung: Secondary | ICD-10-CM | POA: Diagnosis not present

## 2021-11-19 DIAGNOSIS — C3490 Malignant neoplasm of unspecified part of unspecified bronchus or lung: Secondary | ICD-10-CM | POA: Diagnosis not present

## 2021-11-23 ENCOUNTER — Other Ambulatory Visit: Payer: Self-pay | Admitting: Internal Medicine

## 2021-11-24 ENCOUNTER — Ambulatory Visit: Payer: Medicare HMO | Admitting: Urology

## 2021-11-26 ENCOUNTER — Telehealth: Payer: Self-pay | Admitting: Internal Medicine

## 2021-11-26 NOTE — Telephone Encounter (Signed)
Called patient regarding upcoming appointments, left a voicemail. 

## 2021-12-06 ENCOUNTER — Other Ambulatory Visit: Payer: Self-pay | Admitting: Medical Oncology

## 2021-12-06 ENCOUNTER — Other Ambulatory Visit: Payer: Self-pay | Admitting: *Deleted

## 2021-12-06 DIAGNOSIS — C3490 Malignant neoplasm of unspecified part of unspecified bronchus or lung: Secondary | ICD-10-CM

## 2021-12-06 DIAGNOSIS — C3412 Malignant neoplasm of upper lobe, left bronchus or lung: Secondary | ICD-10-CM

## 2021-12-06 MED ORDER — OSIMERTINIB MESYLATE 40 MG PO TABS: 40.0000 mg | ORAL_TABLET | Freq: Every day | ORAL | 2 refills | Status: DC

## 2021-12-07 ENCOUNTER — Telehealth: Payer: Self-pay | Admitting: Physician Assistant

## 2021-12-07 NOTE — Telephone Encounter (Signed)
Rescheduled 04/03 appointment to 04/05 per patient's request. ?

## 2021-12-09 ENCOUNTER — Inpatient Hospital Stay: Payer: Medicare HMO

## 2021-12-10 ENCOUNTER — Inpatient Hospital Stay: Payer: Medicare HMO

## 2021-12-10 ENCOUNTER — Other Ambulatory Visit: Payer: Self-pay

## 2021-12-10 ENCOUNTER — Ambulatory Visit (HOSPITAL_COMMUNITY)
Admission: RE | Admit: 2021-12-10 | Discharge: 2021-12-10 | Disposition: A | Discharge status: Home / Self Care | Payer: Medicare HMO | Source: Ambulatory Visit | Attending: Internal Medicine | Admitting: Internal Medicine

## 2021-12-10 DIAGNOSIS — R918 Other nonspecific abnormal finding of lung field: Secondary | ICD-10-CM | POA: Diagnosis not present

## 2021-12-10 DIAGNOSIS — C3490 Malignant neoplasm of unspecified part of unspecified bronchus or lung: Secondary | ICD-10-CM | POA: Diagnosis not present

## 2021-12-10 DIAGNOSIS — K769 Liver disease, unspecified: Secondary | ICD-10-CM | POA: Diagnosis not present

## 2021-12-10 LAB — CBC WITH DIFFERENTIAL (CANCER CENTER ONLY)
Abs Immature Granulocytes: 0.09 10*3/uL — ABNORMAL HIGH (ref 0.00–0.07)
Basophils Absolute: 0 10*3/uL (ref 0.0–0.1)
Basophils Relative: 1 %
Eosinophils Absolute: 0.2 10*3/uL (ref 0.0–0.5)
Eosinophils Relative: 3 %
HCT: 37.9 % (ref 36.0–46.0)
Hemoglobin: 12.3 g/dL (ref 12.0–15.0)
Immature Granulocytes: 1 %
Lymphocytes Relative: 24 %
Lymphs Abs: 1.5 10*3/uL (ref 0.7–4.0)
MCH: 29.9 pg (ref 26.0–34.0)
MCHC: 32.5 g/dL (ref 30.0–36.0)
MCV: 92 fL (ref 80.0–100.0)
Monocytes Absolute: 0.6 10*3/uL (ref 0.1–1.0)
Monocytes Relative: 10 %
Neutro Abs: 4 10*3/uL (ref 1.7–7.7)
Neutrophils Relative %: 61 %
Platelet Count: 132 10*3/uL — ABNORMAL LOW (ref 150–400)
RBC: 4.12 MIL/uL (ref 3.87–5.11)
RDW: 14.3 % (ref 11.5–15.5)
WBC Count: 6.4 10*3/uL (ref 4.0–10.5)
nRBC: 0 % (ref 0.0–0.2)

## 2021-12-10 LAB — CMP (CANCER CENTER ONLY)
ALT: 26 U/L (ref 0–44)
AST: 23 U/L (ref 15–41)
Albumin: 3.9 g/dL (ref 3.5–5.0)
Alkaline Phosphatase: 50 U/L (ref 38–126)
Anion gap: 8 (ref 5–15)
BUN: 27 mg/dL — ABNORMAL HIGH (ref 8–23)
CO2: 25 mmol/L (ref 22–32)
Calcium: 9.4 mg/dL (ref 8.9–10.3)
Chloride: 107 mmol/L (ref 98–111)
Creatinine: 1.3 mg/dL — ABNORMAL HIGH (ref 0.44–1.00)
GFR, Estimated: 42 mL/min — ABNORMAL LOW (ref >60)
Glucose, Bld: 95 mg/dL (ref 70–99)
Potassium: 3.6 mmol/L (ref 3.5–5.1)
Sodium: 140 mmol/L (ref 135–145)
Total Bilirubin: 0.7 mg/dL (ref 0.3–1.2)
Total Protein: 6.9 g/dL (ref 6.5–8.1)

## 2021-12-10 IMAGING — CT CT CHEST W/ CM
2 of 4 series · 13 of 36 positions shown, 16 images · IV contrast (agent unspecified)
Comparison: Ultrasound-guided liver biopsy, [DATE], CT chest
abdomen pelvis, [DATE]

CLINICAL DATA: Non-small cell lung cancer restaging * Tracking
Code: BO *

EXAM:
CT CHEST, ABDOMEN, AND PELVIS WITH CONTRAST
TECHNIQUE: Multidetector CT imaging of the chest, abdomen and pelvis was
performed following the standard protocol during bolus
administration of intravenous contrast.

[Series 2: cap with · axial · 0.98mm/px · z∈[-187,+328]mm · 10 of 125 slices shown, 13 images]
[im 11/125  mediastinal]
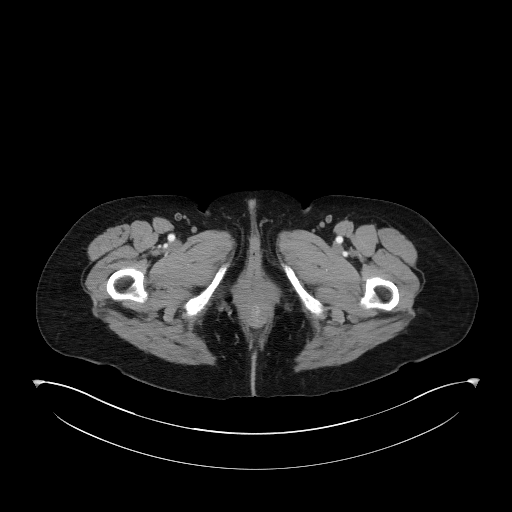
[im 11/125  lung]
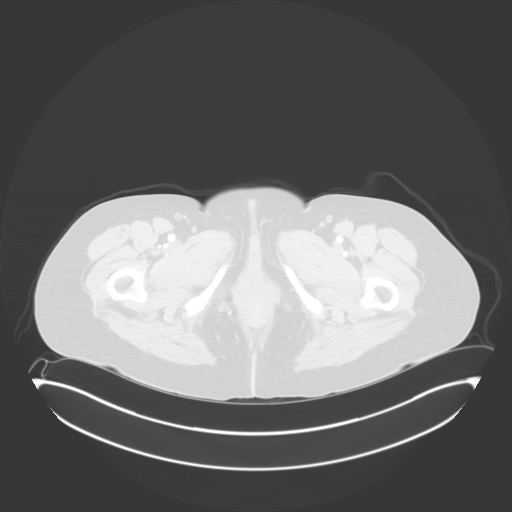
[im 21/125  lung]
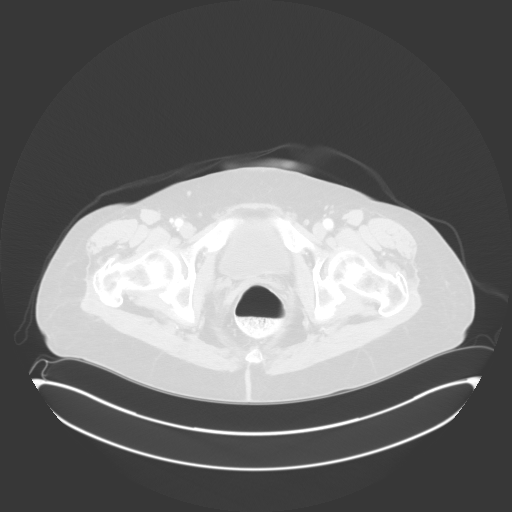
[im 32/125  lung]
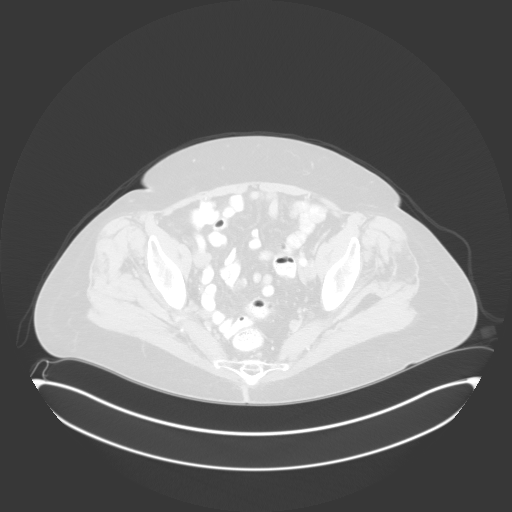
[im 42/125  lung]
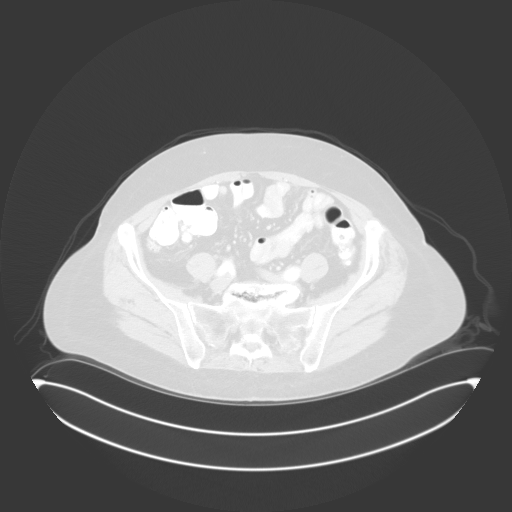
[im 52/125  mediastinal]
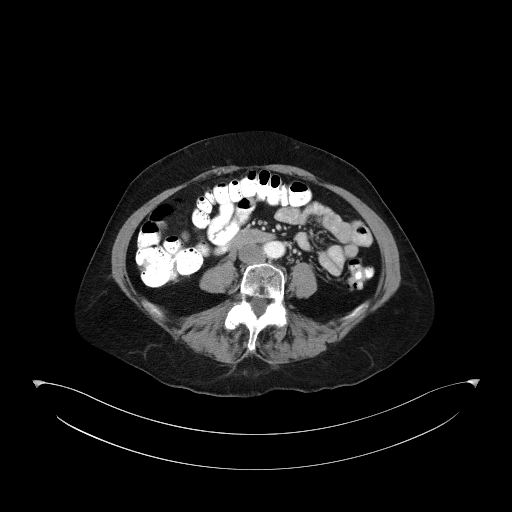
[im 52/125  lung]
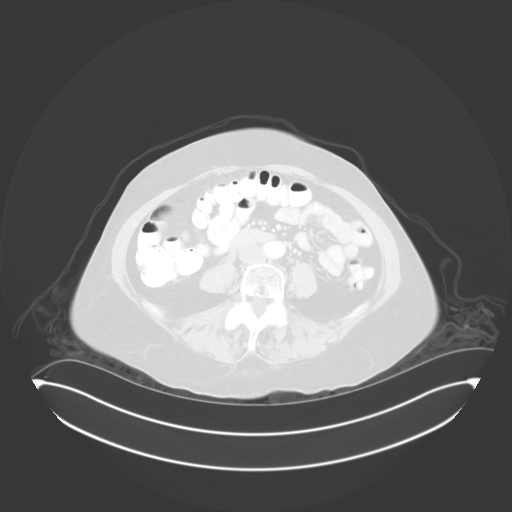
[im 73/125  lung]
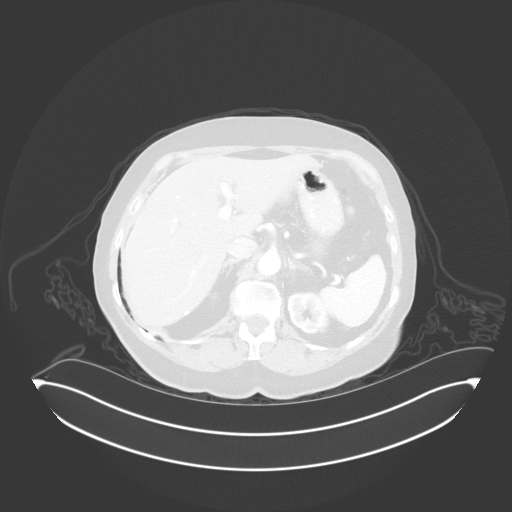
[im 83/125  lung]
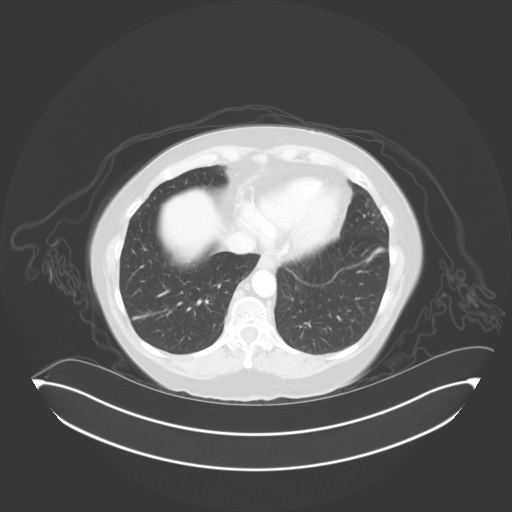
[im 94/125  lung]
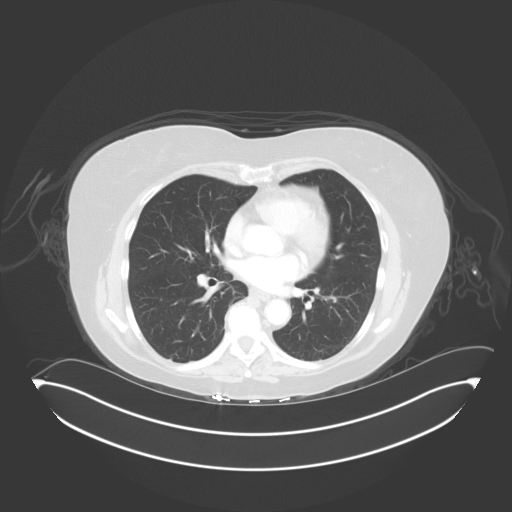
[im 104/125  mediastinal]
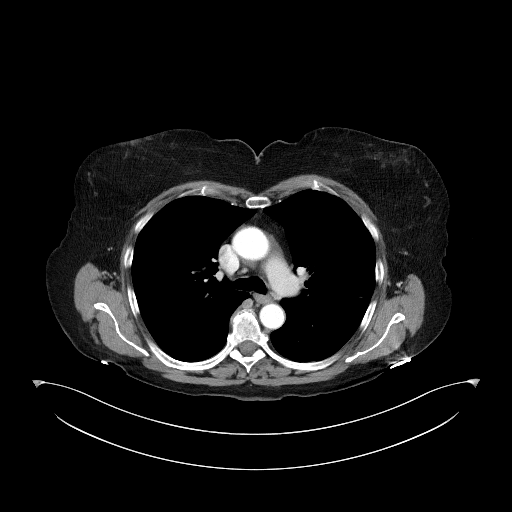
[im 104/125  lung]
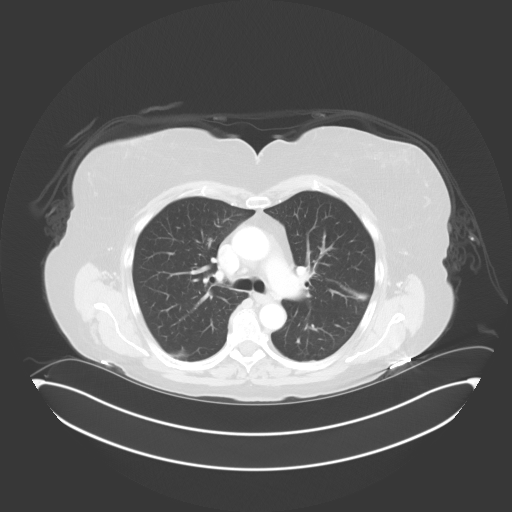
[im 114/125  lung]
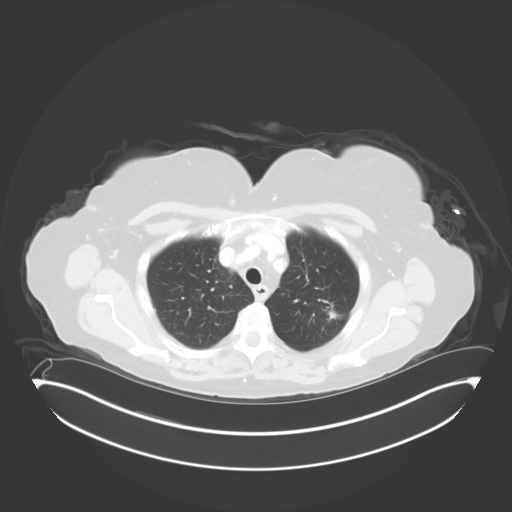

[Series 4: coronals · coronal · 0.87mm/px · 3 of 147 slices shown]
[im 30/147  lung]
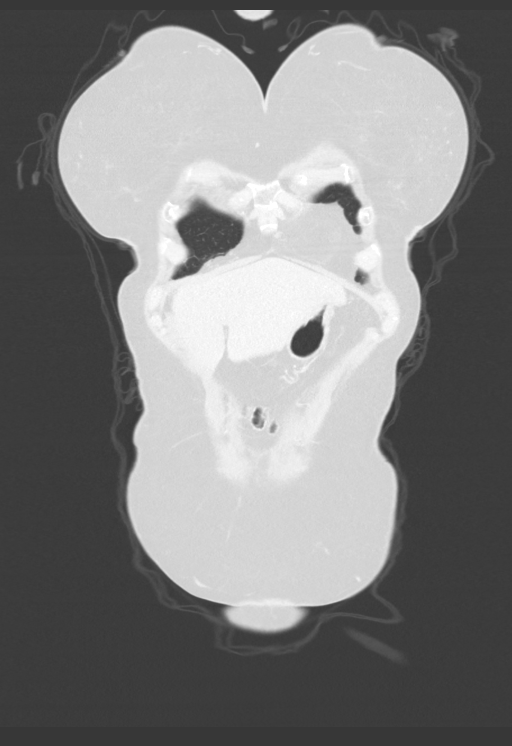
[im 59/147  lung]
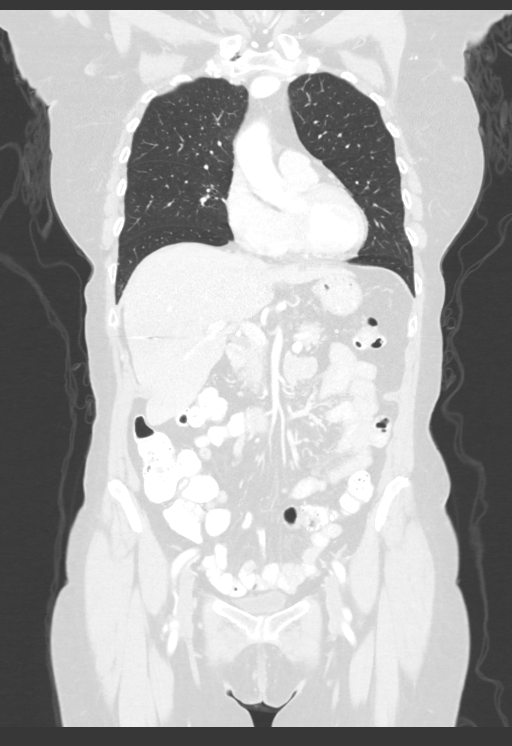
[im 88/147  lung]
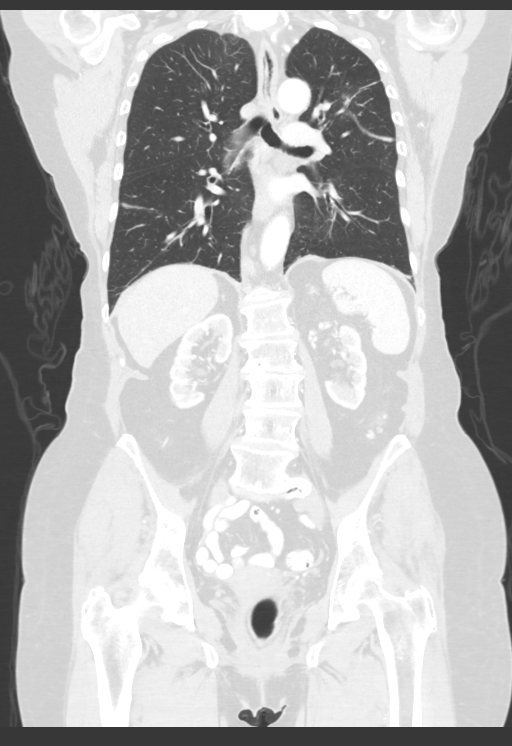

[13 of 36 positions shown; findings below may reference images not displayed]

RADIATION DOSE REDUCTION: This exam was performed according to the
departmental dose-optimization program which includes automated
exposure control, adjustment of the mA and/or kV according to
patient size and/or use of iterative reconstruction technique.

CONTRAST:  80mL OMNIPAQUE IOHEXOL 300 MG/ML SOLN, additional oral
enteric contrast
FINDINGS: CT CHEST FINDINGS

Cardiovascular: No significant vascular findings. Normal heart size.
No pericardial effusion.

Mediastinum/Nodes: No enlarged mediastinal, hilar, or axillary lymph
nodes. Thyroid gland, trachea, and esophagus demonstrate no
significant findings.

Lungs/Pleura: Interval enlargement of a spiculated mass of the
posterior left upper lobe, measuring 2.1 x 2.0 cm, previously 1.4 x
1.4 cm (series 6, image 39). Unchanged, bandlike scarring of the
bilateral lung bases. No pleural effusion or pneumothorax.

Musculoskeletal: No chest wall mass or suspicious osseous lesions
identified.

CT ABDOMEN PELVIS FINDINGS

Hepatobiliary: Subcapsular liver lesion of the anterior right lobe
of the liver, hepatic segment V is slightly diminished in size,
measuring 2.0 x 1.5 cm, previously 2.2 x 2.2 cm, now with a halo of
edema and adjacent biopsy/fiducial markers. No gallstones,
gallbladder wall thickening, or biliary dilatation.

Pancreas: Unremarkable. No pancreatic ductal dilatation or
surrounding inflammatory changes.

Spleen: Normal in size without significant abnormality.

Adrenals/Urinary Tract: Adrenal glands are unremarkable. Kidneys are
normal, without renal calculi, solid lesion, or hydronephrosis.
Bladder is unremarkable.

Stomach/Bowel: Stomach is within normal limits. Appendix is
surgically absent. No evidence of bowel wall thickening, distention,
or inflammatory changes. Descending and sigmoid diverticula.

Vascular/Lymphatic: Aortic atherosclerosis. No enlarged abdominal or
pelvic lymph nodes.

Reproductive: No mass or other abnormality.

Other: No abdominal wall hernia or abnormality. No ascites.

Musculoskeletal: No acute osseous findings.
IMPRESSION: 1. Interval enlargement of a spiculated mass of the posterior left
upper lobe, consistent with worsened malignancy.
2. Subcapsular liver lesion of the anterior right lobe of the liver
is slightly diminished in size, now with a halo of edema and
adjacent biopsy/fiducial markers. Findings are consistent with
treatment response to external beam radiation.
3. No other evidence of metastatic disease in the chest, abdomen, or
pelvis.

Aortic Atherosclerosis ([AA]-[AA]).

## 2021-12-10 MED ORDER — IOHEXOL 300 MG/ML  SOLN
100.0000 mL | Freq: Once | INTRAMUSCULAR | Status: AC | PRN
  Administered 2021-12-10: 80 mL via INTRAVENOUS

## 2021-12-13 ENCOUNTER — Inpatient Hospital Stay: Payer: Medicare HMO | Admitting: Internal Medicine

## 2021-12-13 NOTE — [facility] Notes (Signed)
[place] ?OFFICE PROGRESS NOTE ? ?Marie Noe, MD ?[place] [address] ? ?DIAGNOSIS: Stage IV (T1c, N1, M1c) non-small cell lung cancer, adenocarcinoma presented with locally advanced disease in the left lung  with a left upper lobe lung nodule in addition to left hilar adenopathy and a solitary left frontal brain metastasis s/p resection of the brain tumor with SRS.  This was diagnosed in July 2022. ?  ?Biomarker Findings ?Tumor Mutational Burden - 43 Muts/Mb ?Microsatellite status - MS-Stable ?Genomic Findings ?For a complete list of the genes assayed, please refer to the Appendix. ?EGFR L861Q ?MTAP loss ?CDKN2A/B CDKN2A loss, CDKN2B loss ?CUL4A amplification - equivocal? ?IRS2 amplification ?SF3B1 K666N ?TERT promoter -124C>T ?TP53 N247I ?7 Disease relevant genes with no reportable ?alterations: ALK, BRAF, ERBB2, KRAS, MET, RET, ROS1 ?  ?PDL1 Expression 95% ? ?PRIOR THERAPY: ?1)  Resection followed by [facility] of the solitary frontal lobe brain metastasis on July 28th, 2022  ?2) SRS to the new metastatic brain lesion performed on 10/20/21 ?3) SBRT to the liver lesion under the care of Dr. Tammi Klippel, completed on 11/19/21 ? ?CURRENT THERAPY: Targeted treatment with Tagrisso 80 mg p.o. daily. First dose 05/27/21.  This was reduced to 40 mg p.o. daily on July 14, 2021 status post 6 months of treatment.  ? ?INTERVAL HISTORY: ?Marie Hensley 76 y.o. female returns to the clinic today for a follow-up visit accompanied by her daughter. The patient is feeling fine today with no concerning complaints except she has been having blurry vision with distance as well as decreased peripheral vision. She saw her eye doctor a few weeks ago for this. She had previously seen her for cataracts. He gave her new glasses but continues to have visual changes. The patient is on targeted treatment with Tagrisso.  She started this in September 2022.  She was tolerating this fairly well at the beginning  but developed profuse uncontrollable diarrhea and consequently some electrolyte derangements. Therefore, her dose had to be reduced in November 2022. She is currently on 40 mg p.o. daily of Tagrisso. She is tolerating this much better.  ? ?She recently underwent SRS to a new metastatic brain lesion in February 2023.  She then underwent SBRT to an enlarging liver lesion which was completed on 11/19/2021.  ? ?The patient otherwise is feeling fairly well today without any new concerning complaints.  Denies any fever, chills, or significant weight loss. Denies any chest pain, cough, or hemoptysis.  She reports dyspnea on exertion only with strenuous activities. She has been more active recently and working in the yard more. Denies any nausea, vomiting, or constipation.  She denies any unusual diarrhea.  She follows closely with radiation oncology/neuro-oncology for history of metastatic disease to the brain.  Her next brain MRI is scheduled for 01/17/22. Denies rashes or itching. She has some bruising from working in the yard on her extremities. She had a restaging CT scan performed.  She is here today for evaluation and repeat blood work.  ? ? ?MEDICAL HISTORY: ?Past Medical History:  ?Diagnosis Date  ? Arthritis   ? Brain tumor ([place])   ? Depression   ? Dyspnea   ? WITH EXERTION-SEEING DR [name]  ? HTN (hypertension)   ? Hyperlipemia   ? Seizures ([place])   ? ? ?ALLERGIES:  has No Known Allergies. ? ?MEDICATIONS:  ?Current Outpatient Medications  ?Medication Sig Dispense Refill  ? acetaminophen (TYLENOL) 500 MG tablet Take 1,000 mg by  mouth every 6 (six) hours as needed for mild pain (or arthritis). (Patient not taking: Reported on 11/11/2021)    ? atorvastatin (LIPITOR) 40 MG tablet Take 1 tablet (40 mg total) by mouth at bedtime. 90 tablet 3  ? cetirizine (ZYRTEC) 10 MG tablet Take 10 mg by mouth daily as needed for allergies or rhinitis.    ? citalopram (CELEXA) 20 MG tablet Take 1 tablet (20 mg total) by mouth at  bedtime. For depression 90 tablet 0  ? dexamethasone (DECADRON) 1 MG tablet TAKE ONE TABLET BY MOUTH ONE TIME DAILY 30 tablet 3  ? diphenoxylate-atropine (LOMOTIL) 2.5-0.025 MG tablet Take 1 tablet by mouth 4 (four) times daily as needed for diarrhea or loose stools. 30 tablet 0  ? fluticasone (FLONASE) 50 MCG/ACT nasal spray Place 1 spray into both nostrils daily as needed for allergies.    ? levETIRAcetam (KEPPRA) 500 MG tablet TAKE TWO TABLETS BY MOUTH TWICE A DAY 120 tablet 3  ? lisinopril-hydrochlorothiazide (ZESTORETIC) 10-12.5 MG tablet Take 1 tablet by mouth daily. (Patient not taking: Reported on 11/11/2021) 90 tablet 3  ? loperamide (IMODIUM A-D) 2 MG tablet Take 2 mg by mouth 2 (two) times daily as needed for diarrhea or loose stools.    ? osimertinib mesylate (TAGRISSO) 40 MG tablet Take 1 tablet (40 mg total) by mouth daily. 30 tablet 2  ? oxyCODONE (OXY IR/ROXICODONE) 5 MG immediate release tablet Take 1 tablet (5 mg total) by mouth 3 (three) times daily as needed for severe pain. 20 tablet 0  ? prochlorperazine (COMPAZINE) 10 MG tablet Take 1 tablet (10 mg total) by mouth every 6 (six) hours as needed for nausea or vomiting. 30 tablet 0  ? propranolol ER (INDERAL LA) 60 MG 24 hr capsule Take 1 capsule (60 mg total) by mouth at bedtime. (Patient not taking: Reported on 11/11/2021) 90 capsule 3  ? ?No current facility-administered medications for this visit.  ? ? ?SURGICAL HISTORY:  ?Past Surgical History:  ?Procedure Laterality Date  ? APPENDECTOMY  1982  ? APPLICATION OF CRANIAL NAVIGATION N/A 04/08/2021  ? Procedure: APPLICATION OF CRANIAL NAVIGATION;  Surgeon: Judith Part, MD;  Location: [place];  Service: Neurosurgery;  Laterality: N/A;  RM 21  ? CRANIOTOMY Left 04/08/2021  ? Procedure: Left Craniotomy for tumor resection with brainlab;  Surgeon: Judith Part, MD;  Location: [place];  Service: Neurosurgery;  Laterality: Left;  ? EYE SURGERY Bilateral   ? CATARACTS  ? OOPHORECTOMY Right   ?  VIDEO BRONCHOSCOPY WITH ENDOBRONCHIAL NAVIGATION Left 12/23/2020  ? Procedure: VIDEO BRONCHOSCOPY WITH ENDOBRONCHIAL NAVIGATION;  Surgeon: Tyler Pita, MD;  Location: [facility];  Service: Pulmonary;  Laterality: Left;  ? VIDEO BRONCHOSCOPY WITH ENDOBRONCHIAL NAVIGATION Left 02/01/2021  ? Procedure: ROBOTIC ASSISTED VIDEO BRONCHOSCOPY WITH ENDOBRONCHIAL NAVIGATION;  Surgeon: Tyler Pita, MD;  Location: [facility];  Service: Pulmonary;  Laterality: Left;  ? VIDEO BRONCHOSCOPY WITH ENDOBRONCHIAL ULTRASOUND Left 12/23/2020  ? Procedure: VIDEO BRONCHOSCOPY WITH ENDOBRONCHIAL ULTRASOUND;  Surgeon: Tyler Pita, MD;  Location: [facility];  Service: Pulmonary;  Laterality: Left;  ? VIDEO BRONCHOSCOPY WITH ENDOBRONCHIAL ULTRASOUND Left 02/01/2021  ? Procedure: ROBOTIC ASSITSTED VIDEO BRONCHOSCOPY WITH ENDOBRONCHIAL ULTRASOUND;  Surgeon: Tyler Pita, MD;  Location: [facility];  Service: Pulmonary;  Laterality: Left;  ? ? ?REVIEW OF SYSTEMS:   ?Review of Systems  ?Constitutional: Negative for appetite change, chills, fatigue, fever and unexpected weight change.  ?HENT: Negative for mouth sores, nosebleeds, sore throat and  trouble swallowing.   ?Eyes: Positive peripheral field defects.  ?Respiratory: Positive for mild dyspnea with certain activities. Negative for cough, hemoptysis, and wheezing. ?Cardiovascular: Negative for chest pain and leg swelling.  ?Gastrointestinal: Negative for abdominal pain, constipation, diarrhea, nausea and vomiting.  ?Genitourinary: Negative for bladder incontinence, difficulty urinating, dysuria, frequency and hematuria.   ?Musculoskeletal: Negative for back pain, gait problem, neck pain and neck stiffness.  ?Skin: Negative for itching and rash.  ?Neurological: Positive for trouble with word finding. Negative for dizziness, extremity weakness, gait problem, headaches, light-headedness and seizures.  ?Hematological: Negative for adenopathy. Does not bruise/bleed easily.   ?Psychiatric/Behavioral: Negative for confusion, depression. Positive for some occasional insomnia. The patient is not nervous/anxious.   ? ? ?PHYSICAL EXAMINATION:  ?Blood pressure 139/87, pulse 92, temperature 98.5 ?F (3

## 2021-12-15 ENCOUNTER — Inpatient Hospital Stay: Payer: Medicare HMO | Attending: Internal Medicine | Admitting: Physician Assistant

## 2021-12-15 VITALS — BP 139/87 | HR 92 | Temp 98.5°F | Resp 18 | Ht 64.0 in | Wt 167.2 lb

## 2021-12-15 DIAGNOSIS — Z79899 Other long term (current) drug therapy: Secondary | ICD-10-CM | POA: Diagnosis not present

## 2021-12-15 DIAGNOSIS — Z923 Personal history of irradiation: Secondary | ICD-10-CM | POA: Insufficient documentation

## 2021-12-15 DIAGNOSIS — C7931 Secondary malignant neoplasm of brain: Secondary | ICD-10-CM | POA: Diagnosis not present

## 2021-12-15 DIAGNOSIS — C787 Secondary malignant neoplasm of liver and intrahepatic bile duct: Secondary | ICD-10-CM | POA: Insufficient documentation

## 2021-12-15 DIAGNOSIS — Z90721 Acquired absence of ovaries, unilateral: Secondary | ICD-10-CM | POA: Insufficient documentation

## 2021-12-15 DIAGNOSIS — C3412 Malignant neoplasm of upper lobe, left bronchus or lung: Secondary | ICD-10-CM | POA: Diagnosis not present

## 2021-12-25 ENCOUNTER — Other Ambulatory Visit: Payer: Self-pay | Admitting: Family Medicine

## 2021-12-25 DIAGNOSIS — I10 Essential (primary) hypertension: Secondary | ICD-10-CM

## 2021-12-26 ENCOUNTER — Encounter: Payer: Self-pay | Admitting: Internal Medicine

## 2021-12-28 ENCOUNTER — Other Ambulatory Visit: Payer: Self-pay | Admitting: Radiation Therapy

## 2021-12-28 NOTE — Addendum Note (Signed)
Addended by: Pilar Grammes on: 12/28/2021 11:20 AM ? ? Modules accepted: Orders ? ?

## 2021-12-28 NOTE — Telephone Encounter (Signed)
Called pt to find out if she was taking the medication as the note next to it in her med list stated not taking. SHe said she is not taking it. I have removed it from her list. ?

## 2021-12-29 ENCOUNTER — Ambulatory Visit: Payer: Medicare HMO | Admitting: Urology

## 2021-12-30 ENCOUNTER — Telehealth: Payer: Self-pay

## 2021-12-30 NOTE — Telephone Encounter (Signed)
Reconsult appointment reminder. I verified patient identity and reminded her of her 10:00am-12/31/21 in-person appointment w/ Ashlyn Bruning PA-C. I advised patient to arrive 73min early for check-in. I left my extension [phone] in case patient needs anything. Patient verbalized understanding of information. ?

## 2021-12-31 ENCOUNTER — Encounter: Payer: Self-pay | Admitting: Urology

## 2021-12-31 ENCOUNTER — Ambulatory Visit
Admission: RE | Admit: 2021-12-31 | Discharge: 2021-12-31 | Disposition: A | Discharge status: Home / Self Care | Payer: Medicare HMO | Source: Ambulatory Visit | Attending: Urology | Admitting: Urology

## 2021-12-31 ENCOUNTER — Other Ambulatory Visit: Payer: Self-pay

## 2021-12-31 VITALS — BP 142/73 | HR 95 | Temp 96.9°F | Resp 18 | Ht 64.0 in | Wt 167.2 lb

## 2021-12-31 DIAGNOSIS — I10 Essential (primary) hypertension: Secondary | ICD-10-CM | POA: Diagnosis not present

## 2021-12-31 DIAGNOSIS — C7931 Secondary malignant neoplasm of brain: Secondary | ICD-10-CM | POA: Diagnosis not present

## 2021-12-31 DIAGNOSIS — Z79899 Other long term (current) drug therapy: Secondary | ICD-10-CM | POA: Diagnosis not present

## 2021-12-31 DIAGNOSIS — Z510 Encounter for antineoplastic radiation therapy: Secondary | ICD-10-CM | POA: Diagnosis not present

## 2021-12-31 DIAGNOSIS — Z87891 Personal history of nicotine dependence: Secondary | ICD-10-CM | POA: Insufficient documentation

## 2021-12-31 DIAGNOSIS — I700 Atherosclerosis of aorta: Secondary | ICD-10-CM | POA: Insufficient documentation

## 2021-12-31 DIAGNOSIS — C3412 Malignant neoplasm of upper lobe, left bronchus or lung: Secondary | ICD-10-CM

## 2021-12-31 DIAGNOSIS — E785 Hyperlipidemia, unspecified: Secondary | ICD-10-CM | POA: Diagnosis not present

## 2021-12-31 NOTE — Progress Notes (Signed)
?  Radiation Oncology         [phone] ?________________________________ ? ?Name: Marie Hensley MRN: [id number]  ?Date: 11/19/2021  DOB: Apr 24, 1946 ? ?End of Treatment Note ? ?Diagnosis:   76 yo woman with a solitary liver metastasis from NSCLC, adenocarcinoma of the left upper lung    ? ?Indication for treatment:  Curative, Definitive SBRT      ? ?Radiation treatment dates:   11/10/21 - 11/19/21 ? ?Site/dose:   The target was treated to 50 Gy in 5 fractions of 10 Gy ? ?Beams/energy:   The patient was treated using stereotactic body radiotherapy according to a 3D conformal radiotherapy plan.  Volumetric arc fields were employed to deliver 6 MV X-rays.  Image guidance was performed with per fraction cone beam CT prior to treatment under personal MD supervision.  Immobilization was achieved using BodyFix Pillow. ? ?Narrative: The patient tolerated radiation treatment relatively well with only modest fatigue.    ? ?Plan: The patient has completed radiation treatment. The patient will return to radiation oncology clinic for routine followup in one month. I advised them to call or return sooner if they have any questions or concerns related to their recovery or treatment. ?________________________________ ? ?Sheral Apley Tammi Klippel, M.D. ? ?  ?

## 2021-12-31 NOTE — Progress Notes (Signed)
?Radiation Oncology         [phone] ?________________________________ ? ?Outpatient Re-Consultation ? ?Name: Marie Hensley MRN: [id number]  ?Date of Service: 12/31/2021 DOB: 22-May-1946 ? ?JS:EGBT, Jobe Marker, MD  Curt Bears, MD  ? ?REFERRING PHYSICIAN: Curt Bears, MD ? ?DIAGNOSIS: 76 y/o female with Stage IV (T1c, N1, M1c) non-small cell lung cancer, adenocarcinoma of the left lung. ? ?  ICD-10-CM   ?1. Primary adenocarcinoma of left upper lobe of lung ([place])  C34.12   ?  ? ? ?HISTORY OF PRESENT ILLNESS: Marie Hensley is a 76 y.o. female seen at the request of Dr. Julien Nordmann.  She is well-known to our service, having previously completed pre-op SRS prior to resection of a solitary brain met in July 2022.  She tolerated her treatment well but did have a breakthrough seizure on 05/09/2021, despite taking Keppra 500 mg p.o. twice daily. She was evaluated with Dr. Mickeal Skinner who increased her Keppra to 1000 mg p.o. twice daily and added Decadron 4 mg p.o. twice daily for 3 days and then daily thereafter. Her speech improved significantly with the Decadron and she has continued on the low-dose 1 mg daily. She has not had any further seizure activity since increasing the dose of Keppra and has continued making good progress with speech therapy and her strength on the right side remains stable.  She was started on targeted therapy with Tagrisso on 05/27/2021 but was unable to tolerate the full dose at 80 mg due to severe diarrhea and dehydration.  She was off the medication for a month or more with significant improvement in the diarrhea.  She was was able to resume the Tagrisso at a reduced dose of 40 mg daily and has tolerated the reduced dose well.  Her posttreatment MRI brain on 07/08/2021 appeared stable with postoperative changes only and no new or progressive disease. Her CT C/A/P for disease restaging on 07/19/2021 showed no significant change in the appearance of the left upper lobe lung nodule but  there was a new, indeterminate, low-attenuation structure within the periphery of the right hepatic lobe liver measuring 1.8 x 1.0 cm, suspicious for a new site of disease.  A liver MRI was performed on 08/12/2021 for further evaluation and demonstrated a 1.8 cm hypervascular mass in the anterior right hepatic lobe of the liver with mistakes that were nonspecific but suspicious for possible early liver metastasis.  No other evidence of metastatic disease within the abdomen. ? ?Her restaging CT C/A/P on 09/29/2021 showed an unchanged appearance of the left posterior upper lobe lesion but significant enlargement of the mass in the anterior right lobe of the liver, measuring 2.2 x 2.2 cm as compared to 1.8 x 1.4 cm previously. Additionally, her MRI brain scan on 10/08/2021 also showed progressive disease with 6 new lesions with only mild associated edema. We saw the patient back on 10/13/2021 to discuss treatment to the enlarging liver lesion and the new brain metastases. She ultimately proceeded with a single fraction of SRS to the new brain metastases and 5 fractions of SBRT to the liver lesion. ? ?Since her last visit, she underwent another restaging CT C/A/P on 12/10/2021 showing a decrease in size of the recently treated liver lesion but interval enlargement of a spiculated mass of the posterior left upper lobe lung, now measuring 2.1 x 2.0 cm as compared to 1.4 x 1.4 cm previously. She has been kindly referred back to [name] today for discussion of radiotherapy to the enlarging lesion. ? ?  PREVIOUS RADIATION THERAPY: Yes  ?04/06/2021//Pre-Op SRS: PTV1: The solitary brain metastasis in the left frontal lobe was treated to 20 Gray in a single fraction. ?Site Technique Total Dose (Gy) Dose per Fx (Gy) Completed Fx Beam Energies  ?Brain: Brain_SRS ?PTV_1 Lt Frontal 97m IMRT 20/20 20 1/1 6XFFF  ? ?10/20/21, SRS: These 6 targets were treated with single isocenter using 7 Rapid Arc VMAT Beams to a prescription dose of 20  Gy. ? ?11/10/21 - 11/19/21: The liver target was treated to 50 Gy in 5 fractions of 10 Gy ? ?PAST MEDICAL HISTORY:  ?Past Medical History:  ?Diagnosis Date  ? Arthritis   ? [place]   ? Depression   ? Dyspnea   ? WITH EXERTION-SEEING DR GPatsey Berthold ? HTN (hypertension)   ? Hyperlipemia   ? [place]   ?   ? ?PAST SURGICAL HISTORY: ?Past Surgical History:  ?Procedure Laterality Date  ? APPENDECTOMY  1982  ? APPLICATION OF CRANIAL NAVIGATION N/A 04/08/2021  ? Procedure: APPLICATION OF CRANIAL NAVIGATION;  Surgeon: OJudith Part MD;  [place]  Service: Neurosurgery;  Laterality: N/A;  RM 21  ? CRANIOTOMY Left 04/08/2021  ? Procedure: Left Craniotomy for tumor resection with brainlab;  Surgeon: OJudith Part MD;  [place] [facility]  Service: Neurosurgery;  Laterality: Left;  ? EYE SURGERY Bilateral   ? CATARACTS  ? OOPHORECTOMY Right   ? VIDEO BRONCHOSCOPY WITH ENDOBRONCHIAL NAVIGATION Left 12/23/2020  ? Procedure: VIDEO BRONCHOSCOPY WITH ENDOBRONCHIAL NAVIGATION;  Surgeon: GTyler Pita MD;  Location: [facility];  Service: Pulmonary;  Laterality: Left;  ? VIDEO BRONCHOSCOPY WITH ENDOBRONCHIAL NAVIGATION Left 02/01/2021  ? Procedure: ROBOTIC ASSISTED VIDEO BRONCHOSCOPY WITH ENDOBRONCHIAL NAVIGATION;  Surgeon: GTyler Pita MD;  Location: [facility];  Service: Pulmonary;  Laterality: Left;  ? VIDEO BRONCHOSCOPY WITH ENDOBRONCHIAL ULTRASOUND Left 12/23/2020  ? Procedure: VIDEO BRONCHOSCOPY WITH ENDOBRONCHIAL ULTRASOUND;  Surgeon: GTyler Pita MD;  Location: [facility];  Service: Pulmonary;  Laterality: Left;  ? VIDEO BRONCHOSCOPY WITH ENDOBRONCHIAL ULTRASOUND Left 02/01/2021  ? Procedure: ROBOTIC ASSITSTED VIDEO BRONCHOSCOPY WITH ENDOBRONCHIAL ULTRASOUND;  Surgeon: GTyler Pita MD;  Location: [facility];  Service: Pulmonary;  Laterality: Left;  ? ? ?FAMILY HISTORY:  ?Family History  ?Problem Relation Age of Onset  ? Addison's disease Daughter   ? Hypertension Mother   ?     died at  138 ? Alcohol abuse Father   ? ? ?SOCIAL HISTORY:  ?Social History  ? ?Socioeconomic History  ? Marital status: Single  ?  Spouse name: Not on file  ? Number of children: 3  ? Years of education: high school   ? Highest education level: Not on file  ?Occupational History  ? Occupation: retired  ?Tobacco Use  ? Smoking status: Former  ?  Packs/day: 0.50  ?  Years: 20.00  ?  Pack years: 10.00  ?  Types: Cigarettes  ?  Quit date: 10/27/1989  ?  Years since quitting: 32.2  ? Smokeless tobacco: Never  ? Tobacco comments:  ?  Smoked off and on in 20 year period  ?Vaping Use  ? Vaping Use: Never used  ?Substance and Sexual Activity  ? Alcohol use: Not Currently  ?  Comment: 3-4 times a week, 2oz liquor   ? Drug use: Never  ? Sexual activity: Not Currently  ?  Birth control/protection: Post-menopausal  ?Other Topics Concern  ? Not on file  ?Social History Narrative  ? She  has discussed advanced directives with her.  She would want CPR and intubation if chance of recovery.  ? 11/19/20  ? From: [place], moved for work 1998  ? Living: alone  ? Work: retired Energy manager of [employer]   ?   ? Family: 3 children - Aldean Jewett - 5 grandchildren, 1 great-grandchild  ?   ? Enjoys: shop, casino, cruise travel  ?   ? Exercise: not as much due to the fatigue  ? Diet: tries to eat healthy, but eats what she wants  ?   ? Safety  ? Seat belts: Yes   ? Guns: No  ? Safe in relationships: Yes   ? ?Social Determinants of Health  ? ?Financial Resource Strain: Low Risk   ? Difficulty of Paying Living Expenses: Not hard at all  ?Food Insecurity: No Food Insecurity  ? Worried About Charity fundraiser in the Last Year: Never true  ? Ran Out of Food in the Last Year: Never true  ?Transportation Needs: No Transportation Needs  ? Lack of Transportation (Medical): No  ? Lack of Transportation (Non-Medical): No  ?Physical Activity: Insufficiently Active  ? Days of Exercise per Week: 3 days  ? Minutes of Exercise per Session: 20 min  ?Stress: No  Stress Concern Present  ? Feeling of Stress : Not at all  ?Social Connections: Socially Isolated  ? Frequency of Communication with Friends and Family: More than three times a week  ? Frequency of Social Frederico Hamman

## 2021-12-31 NOTE — Progress Notes (Signed)
Reconsult appointment. I verified patient's identity and began nursing interview. Patient reports some shortness of breath w/ exertion. No other issues reported at this time. ? ?Meaningful use complete. ?Postmenopausal- NO chances of pregnancy. ? ?BP (!) 142/73 (BP Location: Left Arm, Patient Position: Sitting, Cuff Size: Normal)   Pulse 95   Temp (!) 96.9 ?F (36.1 ?C) (Temporal)   Resp 18   Ht 5\' 4"  (1.626 m)   Wt 167 lb 4 oz (75.9 kg)   SpO2 96%   BMI 28.71 kg/m?  ? ?

## 2021-12-31 NOTE — Progress Notes (Signed)
?  Radiation Oncology         [phone] ?________________________________ ? ?Name: FADUMO HENG MRN: [id number]  ?Date: 12/31/2021  DOB: [birth year] ? ?STEREOTACTIC BODY RADIOTHERAPY ?SIMULATION AND TREATMENT PLANNING NOTE ? ?  ICD-10-CM   ?1. Primary adenocarcinoma of left upper lobe of lung ([place])  C34.12   ?  ? ? ?DIAGNOSIS:  76 yo woman with NSCLC of the left upper lung ? ?NARRATIVE:  The patient was brought to the [place].  Identity was confirmed.  All relevant records and images related to the planned course of therapy were reviewed.  The patient freely provided informed written consent to proceed with treatment after reviewing the details related to the planned course of therapy. The consent form was witnessed and verified by the simulation staff.  Then, the patient was set-up in a stable reproducible  supine position for radiation therapy.  A BodyFix immobilization pillow was fabricated for reproducible positioning.  Then I personally applied the abdominal compression paddle to limit respiratory excursion.  4D respiratoy motion management CT images were obtained.  Surface markings were placed.  The CT images were loaded into the planning software.  Then, using Cine, MIP, and standard views, the internal target volume (ITV) and planning target volumes (PTV) were delinieated, and avoidance structures were contoured.  Treatment planning then occurred.  The radiation prescription was entered and confirmed.  A total of two complex treatment devices were fabricated in the form of the BodyFix immobilization pillow and a neck accuform cushion.  I have requested : 3D Simulation  I have requested a DVH of the following structures: Heart, Lungs, Esophagus, Chest Wall, Brachial Plexus, Major Blood Vessels, and targets. ? ?SPECIAL TREATMENT PROCEDURE:  The planned course of therapy using radiation constitutes a special treatment procedure. Special care is required in the management of this  patient for the following reasons. This treatment constitutes a Special Treatment Procedure for the following reason: [ High dose per fraction requiring special monitoring for increased toxicities of treatment including daily imaging..  The special nature of the planned course of radiotherapy will require increased physician supervision and oversight to ensure patient's safety with optimal treatment outcomes.  This requires extended time and effort.   ? ?RESPIRATORY MOTION MANAGEMENT SIMULATION:  In order to account for effect of respiratory motion on target structures and other organs in the planning and delivery of radiotherapy, this patient underwent respiratory motion management simulation.  To accomplish this, when the patient was brought to the CT simulation planning suite, 4D respiratoy motion management CT images were obtained.  The CT images were loaded into the planning software.  Then, using a variety of tools including Cine, MIP, and standard views, the target volume and planning target volumes (PTV) were delineated.  Avoidance structures were contoured.  Treatment planning then occurred.  Dose volume histograms were generated and reviewed for each of the requested structure.  The resulting plan was carefully reviewed and approved today. ? ?PLAN:  The patient will receive 54 Gy in 3 fractions. ? ?________________________________ ? ?Sheral Apley Tammi Klippel, M.D. ? ?

## 2022-01-03 ENCOUNTER — Encounter: Payer: Self-pay | Admitting: Family Medicine

## 2022-01-03 ENCOUNTER — Other Ambulatory Visit: Payer: Self-pay | Admitting: Internal Medicine

## 2022-01-03 ENCOUNTER — Ambulatory Visit (INDEPENDENT_AMBULATORY_CARE_PROVIDER_SITE_OTHER): Payer: Medicare HMO | Admitting: Family Medicine

## 2022-01-03 VITALS — BP 100/50 | HR 78 | Temp 98.0°F | Ht 63.5 in | Wt 168.1 lb

## 2022-01-03 DIAGNOSIS — E782 Mixed hyperlipidemia: Secondary | ICD-10-CM

## 2022-01-03 DIAGNOSIS — Z0000 Encounter for general adult medical examination without abnormal findings: Secondary | ICD-10-CM | POA: Diagnosis not present

## 2022-01-03 DIAGNOSIS — Z87891 Personal history of nicotine dependence: Secondary | ICD-10-CM | POA: Diagnosis not present

## 2022-01-03 DIAGNOSIS — R7303 Prediabetes: Secondary | ICD-10-CM | POA: Diagnosis not present

## 2022-01-03 DIAGNOSIS — Z510 Encounter for antineoplastic radiation therapy: Secondary | ICD-10-CM | POA: Diagnosis not present

## 2022-01-03 DIAGNOSIS — E785 Hyperlipidemia, unspecified: Secondary | ICD-10-CM

## 2022-01-03 DIAGNOSIS — E2839 Other primary ovarian failure: Secondary | ICD-10-CM | POA: Diagnosis not present

## 2022-01-03 DIAGNOSIS — C3412 Malignant neoplasm of upper lobe, left bronchus or lung: Secondary | ICD-10-CM | POA: Diagnosis not present

## 2022-01-03 MED ORDER — ATORVASTATIN CALCIUM 40 MG PO TABS: 40.0000 mg | ORAL_TABLET | Freq: Every day | ORAL | 3 refills | Status: AC

## 2022-01-03 NOTE — Progress Notes (Signed)
Annual Exam  ? ?Chief Complaint:  ?Chief Complaint  ?Patient presents with  ? Medicare Wellness  ?  No concerns  ? ? ?History of Present Illness:  ?Marie Hensley is a 76 y.o. No obstetric history on file. who LMP was No LMP recorded. Patient is postmenopausal., presents today for her annual examination.   ? ?Hip pain ?- right side ?- tylenol but only a few days ? ?Nutrition ?She does get adequate calcium and Vitamin D in her diet. ?Diet: not great, no taste with the cancer treatment ?Exercise: tries to be active, gardening, cutting the grass, playing with cats ? ? ? ?Social History  ? ?Tobacco Use  ?Smoking Status Former  ? Packs/day: 0.50  ? Years: 20.00  ? Pack years: 10.00  ? Types: Cigarettes  ? Quit date: 10/27/1989  ? Years since quitting: 32.2  ?Smokeless Tobacco Never  ?Tobacco Comments  ? Smoked off and on in 20 year period  ? ?Social History  ? ?Substance and Sexual Activity  ?Alcohol Use Not Currently  ? ?Social History  ? ?Substance and Sexual Activity  ?Drug Use Never  ? ? ? ?General Health ?Dentist in the last year: Yes ?Eye doctor: yes ? ?Safety ?The patient wears seatbelts: yes.     ?The patient feels safe at home and in their relationships: yes. ? ? ? ? ?GYN ?She is not sexually active.  ? ? ?Breast Cancer Screening (Age 74-74):  ?There is no FH of breast cancer. There is no FH of ovarian cancer. BRCA screening Not Indicated.  ?Last Mammogram: aged out ? ? ?Colon Cancer Screening:  ?Age 34-75 yo - benefits outweigh the risk. Adults 38-85 yo who have never been screened benefit.  ?Benefits: 134000 people in 2016 will be diagnosed and 49,000 will die - early detection helps ?[place]: Complications 2/2 to colonoscopy ?High Risk (Colonoscopy): genetic disorder (Lynch syndrome or familial adenomatous polyposis), personal hx of IBD, previous adenomatous polyp, or previous colorectal cancer, FamHx start 10 years before the age at diagnosis, increased in males and black race ? ?Options:  ?FIT - looks  for hemoglobin (blood in the stool) - specific and fairly sensitive - must be done annually ?Cologuard - looks for DNA and blood - more sensitive - therefore can have more false positives, every 3 years ?Colonoscopy - every 10 years if normal - sedation, bowl prep, must have someone drive you ? ?Shared decision making and the patient had decided to do aged out. ? ? ?Social History  ? ?Tobacco Use  ?Smoking Status Former  ? Packs/day: 0.50  ? Years: 20.00  ? Pack years: 10.00  ? Types: Cigarettes  ? Quit date: 10/27/1989  ? Years since quitting: 32.2  ?Smokeless Tobacco Never  ?Tobacco Comments  ? Smoked off and on in 20 year period  ? ? ?Lung Cancer Screening (Ages 62-80): active treatment ? ?Weight ?Wt Readings from Last 3 Encounters:  ?01/03/22 168 lb 2 oz (76.3 kg)  ?12/31/21 167 lb 4 oz (75.9 kg)  ?12/15/21 167 lb 3.2 oz (75.8 kg)  ? ?Patient has high BMI  ?BMI Readings from Last 1 Encounters:  ?01/03/22 29.31 kg/m?  ? ? ? ?Chronic disease screening ?Blood pressure monitoring:  ?BP Readings from Last 3 Encounters:  ?01/03/22 (!) 100/50  ?12/31/21 (!) 142/73  ?12/15/21 139/87  ? ? ?Lipid Monitoring: Indication for screening: age >74, obesity, diabetes, family hx, CV risk factors.  ?Lipid screening: Yes ? ?Lab Results  ?Component Value Date  ?  CHOL 206 (H) 11/19/2020  ? HDL 69.90 11/19/2020  ? LDLCALC 101 (H) 11/19/2020  ? LDLDIRECT 109.0 04/20/2020  ? TRIG 174.0 (H) 11/19/2020  ? CHOLHDL 3 11/19/2020  ? ? ? ?Diabetes Screening: age >33, overweight, family hx, PCOS, hx of gestational diabetes, at risk ethnicity ?Diabetes Screening screening: Yes ? ?Lab Results  ?Component Value Date  ? HGBA1C 6.4 (H) 04/08/2021  ? ? ? ?Past Medical History:  ?Diagnosis Date  ? Arthritis   ? Brain tumor ([place])   ? Depression   ? Dyspnea   ? WITH EXERTION-SEEING DR [name]  ? HTN (hypertension)   ? Hyperlipemia   ? Seizures ([place])   ? ? ?Past Surgical History:  ?Procedure Laterality Date  ? APPENDECTOMY  1982  ? APPLICATION OF CRANIAL  NAVIGATION N/A 04/08/2021  ? Procedure: APPLICATION OF CRANIAL NAVIGATION;  Surgeon: Judith Part, MD;  Location: [place];  Service: Neurosurgery;  Laterality: N/A;  RM 21  ? CRANIOTOMY Left 04/08/2021  ? Procedure: Left Craniotomy for tumor resection with brainlab;  Surgeon: Judith Part, MD;  Location: [place];  Service: Neurosurgery;  Laterality: Left;  ? EYE SURGERY Bilateral   ? CATARACTS  ? OOPHORECTOMY Right   ? VIDEO BRONCHOSCOPY WITH ENDOBRONCHIAL NAVIGATION Left 12/23/2020  ? Procedure: VIDEO BRONCHOSCOPY WITH ENDOBRONCHIAL NAVIGATION;  Surgeon: Tyler Pita, MD;  Location: [facility];  Service: Pulmonary;  Laterality: Left;  ? VIDEO BRONCHOSCOPY WITH ENDOBRONCHIAL NAVIGATION Left 02/01/2021  ? Procedure: ROBOTIC ASSISTED VIDEO BRONCHOSCOPY WITH ENDOBRONCHIAL NAVIGATION;  Surgeon: Tyler Pita, MD;  Location: [facility];  Service: Pulmonary;  Laterality: Left;  ? VIDEO BRONCHOSCOPY WITH ENDOBRONCHIAL ULTRASOUND Left 12/23/2020  ? Procedure: VIDEO BRONCHOSCOPY WITH ENDOBRONCHIAL ULTRASOUND;  Surgeon: Tyler Pita, MD;  Location: [facility];  Service: Pulmonary;  Laterality: Left;  ? VIDEO BRONCHOSCOPY WITH ENDOBRONCHIAL ULTRASOUND Left 02/01/2021  ? Procedure: ROBOTIC ASSITSTED VIDEO BRONCHOSCOPY WITH ENDOBRONCHIAL ULTRASOUND;  Surgeon: Tyler Pita, MD;  Location: [facility];  Service: Pulmonary;  Laterality: Left;  ? ? ?Prior to Admission medications   ?Medication Sig Start Date End Date Taking? Authorizing Provider  ?acetaminophen (TYLENOL) 500 MG tablet Take 1,000 mg by mouth every 6 (six) hours as needed for mild pain (or arthritis).   Yes [provider]  ?atorvastatin (LIPITOR) 40 MG tablet Take 1 tablet (40 mg total) by mouth at bedtime. 03/03/21  Yes [name], MD  ?cetirizine (ZYRTEC) 10 MG tablet Take 10 mg by mouth daily as needed for allergies or rhinitis.   Yes [provider]  ?citalopram (CELEXA) 20 MG tablet Take 1 tablet (20 mg total) by mouth at  bedtime. For depression 11/16/21  Yes [name], MD  ?dexamethasone (DECADRON) 1 MG tablet TAKE ONE TABLET BY MOUTH ONE TIME DAILY 11/24/21  Yes Vaslow, Acey Lav, MD  ?diphenoxylate-atropine (LOMOTIL) 2.5-0.025 MG tablet Take 1 tablet by mouth 4 (four) times daily as needed for diarrhea or loose stools. 07/03/21  Yes Curt Bears, MD  ?fluticasone [facility]) 50 MCG/ACT nasal spray Place 1 spray into both nostrils daily as needed for allergies.   Yes [provider]  ?levETIRAcetam (KEPPRA) 500 MG tablet TAKE TWO TABLETS BY MOUTH TWICE A DAY 09/14/21  Yes Vaslow, Acey Lav, MD  ?loperamide (IMODIUM A-D) 2 MG tablet Take 2 mg by mouth 2 (two) times daily as needed for diarrhea or loose stools.   Yes [provider]  ?osimertinib mesylate (TAGRISSO) 40 MG tablet Take 1 tablet (40 mg  total) by mouth daily. 12/06/21  Yes Curt Bears, MD  ?oxyCODONE (OXY IR/ROXICODONE) 5 MG immediate release tablet Take 1 tablet (5 mg total) by mouth 3 (three) times daily as needed for severe pain. 11/02/21  Yes Curt Bears, MD  ?prochlorperazine (COMPAZINE) 10 MG tablet Take 1 tablet (10 mg total) by mouth every 6 (six) hours as needed for nausea or vomiting. 04/22/21  Yes Curt Bears, MD  ?propranolol ER (INDERAL LA) 60 MG 24 hr capsule Take 1 capsule (60 mg total) by mouth at bedtime. ?Patient not taking: Reported on 11/11/2021 03/03/21   [name], MD  ? ? ?No Known Allergies ? ?Gynecologic History: No LMP recorded. Patient is postmenopausal. ? ?Obstetric History: No obstetric history on file. ? ?Social History  ? ?Socioeconomic History  ? Marital status: Single  ?  Spouse name: Not on file  ? Number of children: 3  ? Years of education: high school   ? Highest education level: Not on file  ?Occupational History  ? Occupation: retired  ?Tobacco Use  ? Smoking status: Former  ?  Packs/day: 0.50  ?  Years: 20.00  ?  Pack years: 10.00  ?  Types: Cigarettes  ?  Quit date: 10/27/1989  ?  Years since  quitting: 32.2  ? Smokeless tobacco: Never  ? Tobacco comments:  ?  Smoked off and on in 20 year period  ?Vaping Use  ? Vaping Use: Never used  ?Substance and Sexual Activity  ? Alcohol use: Not Current

## 2022-01-03 NOTE — Patient Instructions (Addendum)
Consider getting Tdap or Shingles vaccine (would check with cancer doctor first) - would need to get at the pharmacy ? ?Please call the location of your choice from the menu below to schedule your Mammogram and/or Bone Density appointment.   ? ?[place]  ? ?[facility]                ?      Phone:  [phone] ?[phone]                               ?[place], [place] [address]                                                             ?Services: Traditional and 3D Mammogram, Bone Density  ? ?We will check labs - I'll see if your oncologist can order the lipids and hemoglobin a1c to send to me.  ? ?Make appointment with Dr. Lorelei Pont for hip pain ?- try [facility] - lidocaine patch ?

## 2022-01-11 ENCOUNTER — Ambulatory Visit
Admission: RE | Admit: 2022-01-11 | Discharge: 2022-01-11 | Disposition: A | Discharge status: Home / Self Care | Payer: Medicare HMO | Source: Ambulatory Visit | Attending: Urology | Admitting: Urology

## 2022-01-11 ENCOUNTER — Other Ambulatory Visit: Payer: Self-pay

## 2022-01-11 DIAGNOSIS — C3412 Malignant neoplasm of upper lobe, left bronchus or lung: Secondary | ICD-10-CM | POA: Diagnosis not present

## 2022-01-11 LAB — RAD ONC ARIA SESSION SUMMARY
Course Elapsed Days: 0
Plan Fractions Treated to Date: 1
Plan Prescribed Dose Per Fraction: 18 Gy
Plan Total Fractions Prescribed: 3
Plan Total Prescribed Dose: 54 Gy
Reference Point Dosage Given to Date: 18 Gy
Reference Point Session Dosage Given: 18 Gy
Session Number: 1

## 2022-01-13 ENCOUNTER — Ambulatory Visit
Admission: RE | Admit: 2022-01-13 | Discharge: 2022-01-13 | Disposition: A | Discharge status: Home / Self Care | Payer: Medicare HMO | Source: Ambulatory Visit | Attending: Radiation Oncology | Admitting: Radiation Oncology

## 2022-01-13 ENCOUNTER — Other Ambulatory Visit: Payer: Self-pay

## 2022-01-13 ENCOUNTER — Telehealth: Payer: Self-pay | Admitting: *Deleted

## 2022-01-13 DIAGNOSIS — C3412 Malignant neoplasm of upper lobe, left bronchus or lung: Secondary | ICD-10-CM

## 2022-01-13 LAB — RAD ONC ARIA SESSION SUMMARY
Course Elapsed Days: 2
Plan Fractions Treated to Date: 2
Plan Prescribed Dose Per Fraction: 18 Gy
Plan Total Fractions Prescribed: 3
Plan Total Prescribed Dose: 54 Gy
Reference Point Dosage Given to Date: 36 Gy
Reference Point Session Dosage Given: 18 Gy
Session Number: 2

## 2022-01-13 NOTE — Telephone Encounter (Signed)
Prior Authorization was worked and Water engineer would not give [place] for [facility] as they are out of network.  Location needed to be changed to [facility] in order to get prior authorization.   ? ?Needed to let patient know so that the MRI could be rescheduled for [facility] for the same due date.   ? ?Called patient left message to advise, pending call back. ?

## 2022-01-14 ENCOUNTER — Telehealth: Payer: Self-pay | Admitting: *Deleted

## 2022-01-14 NOTE — Telephone Encounter (Signed)
Patient returned call from office yesterday.  ? ?Informed her that MRI originally scheduled at [facility] will need to be cancelled and r/s a [facility] not authorized/out of network.   ? ?R/S MRI: 01/18/22 at 10 am at [facility].  ?Patient given all appointment information and verbalized understanding.  ?

## 2022-01-17 ENCOUNTER — Other Ambulatory Visit: Payer: Self-pay

## 2022-01-17 ENCOUNTER — Encounter: Payer: Self-pay | Admitting: Radiation Oncology

## 2022-01-17 ENCOUNTER — Ambulatory Visit
Admission: RE | Admit: 2022-01-17 | Discharge: 2022-01-17 | Disposition: A | Discharge status: Home / Self Care | Payer: Medicare HMO | Source: Ambulatory Visit | Attending: Radiation Oncology | Admitting: Radiation Oncology

## 2022-01-17 DIAGNOSIS — C3412 Malignant neoplasm of upper lobe, left bronchus or lung: Secondary | ICD-10-CM | POA: Diagnosis not present

## 2022-01-17 DIAGNOSIS — Z87891 Personal history of nicotine dependence: Secondary | ICD-10-CM | POA: Diagnosis not present

## 2022-01-17 DIAGNOSIS — Z510 Encounter for antineoplastic radiation therapy: Secondary | ICD-10-CM | POA: Diagnosis not present

## 2022-01-17 LAB — RAD ONC ARIA SESSION SUMMARY
Course Elapsed Days: 6
Plan Fractions Treated to Date: 3
Plan Prescribed Dose Per Fraction: 18 Gy
Plan Total Fractions Prescribed: 3
Plan Total Prescribed Dose: 54 Gy
Reference Point Dosage Given to Date: 54 Gy
Reference Point Session Dosage Given: 18 Gy
Session Number: 3

## 2022-01-18 ENCOUNTER — Ambulatory Visit: Payer: Medicare HMO

## 2022-01-18 ENCOUNTER — Other Ambulatory Visit: Payer: Medicare HMO

## 2022-01-18 ENCOUNTER — Ambulatory Visit: Payer: Medicare HMO | Admitting: Radiation Oncology

## 2022-01-24 ENCOUNTER — Inpatient Hospital Stay: Payer: Medicare HMO

## 2022-01-26 ENCOUNTER — Other Ambulatory Visit: Payer: Medicare HMO

## 2022-01-26 ENCOUNTER — Ambulatory Visit: Payer: Medicare HMO | Admitting: Internal Medicine

## 2022-01-27 ENCOUNTER — Ambulatory Visit: Payer: Medicare HMO | Admitting: Urology

## 2022-01-31 ENCOUNTER — Inpatient Hospital Stay: Payer: Medicare HMO | Attending: Internal Medicine

## 2022-01-31 ENCOUNTER — Other Ambulatory Visit: Payer: Self-pay

## 2022-01-31 ENCOUNTER — Inpatient Hospital Stay (HOSPITAL_BASED_OUTPATIENT_CLINIC_OR_DEPARTMENT_OTHER): Payer: Medicare HMO | Admitting: Internal Medicine

## 2022-01-31 ENCOUNTER — Encounter: Payer: Self-pay | Admitting: Internal Medicine

## 2022-01-31 VITALS — BP 135/74 | HR 78 | Temp 97.2°F | Resp 17 | Wt 169.4 lb

## 2022-01-31 DIAGNOSIS — Z79899 Other long term (current) drug therapy: Secondary | ICD-10-CM | POA: Diagnosis not present

## 2022-01-31 DIAGNOSIS — Z923 Personal history of irradiation: Secondary | ICD-10-CM | POA: Insufficient documentation

## 2022-01-31 DIAGNOSIS — H539 Unspecified visual disturbance: Secondary | ICD-10-CM | POA: Diagnosis not present

## 2022-01-31 DIAGNOSIS — C3412 Malignant neoplasm of upper lobe, left bronchus or lung: Secondary | ICD-10-CM | POA: Diagnosis not present

## 2022-01-31 DIAGNOSIS — M25559 Pain in unspecified hip: Secondary | ICD-10-CM | POA: Insufficient documentation

## 2022-01-31 DIAGNOSIS — Z9221 Personal history of antineoplastic chemotherapy: Secondary | ICD-10-CM | POA: Diagnosis not present

## 2022-01-31 DIAGNOSIS — C7931 Secondary malignant neoplasm of brain: Secondary | ICD-10-CM | POA: Insufficient documentation

## 2022-01-31 DIAGNOSIS — C3490 Malignant neoplasm of unspecified part of unspecified bronchus or lung: Secondary | ICD-10-CM

## 2022-01-31 DIAGNOSIS — C787 Secondary malignant neoplasm of liver and intrahepatic bile duct: Secondary | ICD-10-CM | POA: Diagnosis not present

## 2022-01-31 DIAGNOSIS — C771 Secondary and unspecified malignant neoplasm of intrathoracic lymph nodes: Secondary | ICD-10-CM | POA: Diagnosis not present

## 2022-01-31 DIAGNOSIS — R5383 Other fatigue: Secondary | ICD-10-CM | POA: Diagnosis not present

## 2022-01-31 DIAGNOSIS — E782 Mixed hyperlipidemia: Secondary | ICD-10-CM

## 2022-01-31 LAB — CBC WITH DIFFERENTIAL (CANCER CENTER ONLY)
Abs Immature Granulocytes: 0.05 10*3/uL (ref 0.00–0.07)
Basophils Absolute: 0 10*3/uL (ref 0.0–0.1)
Basophils Relative: 0 %
Eosinophils Absolute: 0.1 10*3/uL (ref 0.0–0.5)
Eosinophils Relative: 2 %
HCT: 36.5 % (ref 36.0–46.0)
Hemoglobin: 12.2 g/dL (ref 12.0–15.0)
Immature Granulocytes: 1 %
Lymphocytes Relative: 12 %
Lymphs Abs: 0.8 10*3/uL (ref 0.7–4.0)
MCH: 30.7 pg (ref 26.0–34.0)
MCHC: 33.4 g/dL (ref 30.0–36.0)
MCV: 91.7 fL (ref 80.0–100.0)
Monocytes Absolute: 0.5 10*3/uL (ref 0.1–1.0)
Monocytes Relative: 7 %
Neutro Abs: 5.2 10*3/uL (ref 1.7–7.7)
Neutrophils Relative %: 78 %
Platelet Count: 121 10*3/uL — ABNORMAL LOW (ref 150–400)
RBC: 3.98 MIL/uL (ref 3.87–5.11)
RDW: 14 % (ref 11.5–15.5)
WBC Count: 6.7 10*3/uL (ref 4.0–10.5)
nRBC: 0 % (ref 0.0–0.2)

## 2022-01-31 LAB — CMP (CANCER CENTER ONLY)
ALT: 20 U/L (ref 0–44)
AST: 22 U/L (ref 15–41)
Albumin: 3.7 g/dL (ref 3.5–5.0)
Alkaline Phosphatase: 48 U/L (ref 38–126)
Anion gap: 7 (ref 5–15)
BUN: 26 mg/dL — ABNORMAL HIGH (ref 8–23)
CO2: 27 mmol/L (ref 22–32)
Calcium: 9.1 mg/dL (ref 8.9–10.3)
Chloride: 105 mmol/L (ref 98–111)
Creatinine: 1.45 mg/dL — ABNORMAL HIGH (ref 0.44–1.00)
GFR, Estimated: 37 mL/min — ABNORMAL LOW (ref >60)
Glucose, Bld: 164 mg/dL — ABNORMAL HIGH (ref 70–99)
Potassium: 3.7 mmol/L (ref 3.5–5.1)
Sodium: 139 mmol/L (ref 135–145)
Total Bilirubin: 0.6 mg/dL (ref 0.3–1.2)
Total Protein: 6.6 g/dL (ref 6.5–8.1)

## 2022-01-31 NOTE — Progress Notes (Signed)
[place] [phone]   [phone]  OFFICE PROGRESS NOTE  [name], MD [place] [address]  DIAGNOSIS: Stage IV (T1c, N1, M1c) non-small cell lung cancer, adenocarcinoma presented with locally advanced disease in the left lung  with a left upper lobe lung nodule in addition to left hilar adenopathy and a solitary left frontal brain metastasis s/p resection of the brain tumor with SRS.  This was diagnosed in July 2022.   Biomarker Findings Tumor Mutational Burden - 43 Muts/Mb Microsatellite status - MS-Stable Genomic Findings For a complete list of the genes assayed, please refer to the Appendix. EGFR L861Q MTAP loss CDKN2A/B CDKN2A loss, CDKN2B loss CUL4A amplification - equivocal? IRS2 amplification SF3B1 K666N TERT promoter -124C>T TP53 N247I 7 Disease relevant genes with no reportable alterations: ALK, BRAF, ERBB2, KRAS, MET, RET, ROS1   PDL1 Expression 95%   PRIOR THERAPY:  1) Resection followed by [facility] of the solitary frontal lobe brain metastasis on July 28th, 2022  2) SRS to new brain metastasis under the care of Dr. Tammi Klippel. 3) SBRT to enlarging right liver lesion under the care of Dr. Tammi Klippel   CURRENT THERAPY: Targeted treatment with Tagrisso 80 mg p.o. daily. First dose 05/27/21.  This was reduced to 40 mg p.o. daily on July 14, 2021 status post 6 months of treatment  INTERVAL HISTORY: Marie Hensley 76 y.o. female returns to the clinic today for follow-up visit accompanied by her daughter.  The patient is feeling fine today with no concerning complaints except for some visual changes and she is planning to see an ophthalmologist for further evaluation.  She also has arthralgia of the hip area and thinking about seeing an orthopedic surgeon for steroid injection.  The patient denied having any chest pain, shortness of breath, cough or hemoptysis.  She denied having any nausea, vomiting, diarrhea  or constipation.  She has no fever or chills.  There is no recent weight loss or night sweats.  She continues to tolerate her treatment with Tagrisso fairly well.  She is here today for evaluation and repeat blood work.   MEDICAL HISTORY: Past Medical History:  Diagnosis Date   Arthritis    Brain tumor ([facility])    Depression    Dyspnea    WITH EXERTION-SEEING DR [name]   HTN (hypertension)    Hyperlipemia    Seizures ([facility])     ALLERGIES:  has No Known Allergies.  MEDICATIONS:  Current Outpatient Medications  Medication Sig Dispense Refill   acetaminophen (TYLENOL) 500 MG tablet Take 1,000 mg by mouth every 6 (six) hours as needed for mild pain (or arthritis).     atorvastatin (LIPITOR) 40 MG tablet Take 1 tablet (40 mg total) by mouth at bedtime. 90 tablet 3   cetirizine (ZYRTEC) 10 MG tablet Take 10 mg by mouth daily as needed for allergies or rhinitis.     citalopram (CELEXA) 20 MG tablet Take 1 tablet (20 mg total) by mouth at bedtime. For depression 90 tablet 0   dexamethasone (DECADRON) 1 MG tablet TAKE ONE TABLET BY MOUTH ONE TIME DAILY 30 tablet 3   diphenoxylate-atropine (LOMOTIL) 2.5-0.025 MG tablet Take 1 tablet by mouth 4 (four) times daily as needed for diarrhea or loose stools. 30 tablet 0   fluticasone (FLONASE) 50 MCG/ACT nasal spray Place 1 spray into both nostrils daily as needed for allergies.     levETIRAcetam (KEPPRA) 500 MG tablet TAKE  TWO TABLETS BY MOUTH TWICE A DAY 120 tablet 3   loperamide (IMODIUM A-D) 2 MG tablet Take 2 mg by mouth 2 (two) times daily as needed for diarrhea or loose stools.     osimertinib mesylate (TAGRISSO) 40 MG tablet Take 1 tablet (40 mg total) by mouth daily. 30 tablet 2   oxyCODONE (OXY IR/ROXICODONE) 5 MG immediate release tablet Take 1 tablet (5 mg total) by mouth 3 (three) times daily as needed for severe pain. 20 tablet 0   prochlorperazine (COMPAZINE) 10 MG tablet Take 1 tablet (10 mg total) by mouth every 6 (six) hours as needed  for nausea or vomiting. 30 tablet 0   propranolol ER (INDERAL LA) 60 MG 24 hr capsule Take 1 capsule (60 mg total) by mouth at bedtime. (Patient not taking: Reported on 11/11/2021) 90 capsule 3   No current facility-administered medications for this visit.    SURGICAL HISTORY:  Past Surgical History:  Procedure Laterality Date   APPENDECTOMY  8101   APPLICATION OF CRANIAL NAVIGATION N/A 04/08/2021   Procedure: APPLICATION OF CRANIAL NAVIGATION;  Surgeon: Judith Part, MD;  Location: [facility];  Service: Neurosurgery;  Laterality: N/A;  RM 21   CRANIOTOMY Left 04/08/2021   Procedure: Left Craniotomy for tumor resection with brainlab;  Surgeon: Judith Part, MD;  Location: [facility];  Service: Neurosurgery;  Laterality: Left;   EYE SURGERY Bilateral    CATARACTS   OOPHORECTOMY Right    VIDEO BRONCHOSCOPY WITH ENDOBRONCHIAL NAVIGATION Left 12/23/2020   Procedure: VIDEO BRONCHOSCOPY WITH ENDOBRONCHIAL NAVIGATION;  Surgeon: Tyler Pita, MD;  Location: [facility];  Service: Pulmonary;  Laterality: Left;   VIDEO BRONCHOSCOPY WITH ENDOBRONCHIAL NAVIGATION Left 02/01/2021   Procedure: ROBOTIC ASSISTED VIDEO BRONCHOSCOPY WITH ENDOBRONCHIAL NAVIGATION;  Surgeon: Tyler Pita, MD;  Location: [facility];  Service: Pulmonary;  Laterality: Left;   VIDEO BRONCHOSCOPY WITH ENDOBRONCHIAL ULTRASOUND Left 12/23/2020   Procedure: VIDEO BRONCHOSCOPY WITH ENDOBRONCHIAL ULTRASOUND;  Surgeon: Tyler Pita, MD;  Location: [facility];  Service: Pulmonary;  Laterality: Left;   VIDEO BRONCHOSCOPY WITH ENDOBRONCHIAL ULTRASOUND Left 02/01/2021   Procedure: ROBOTIC ASSITSTED VIDEO BRONCHOSCOPY WITH ENDOBRONCHIAL ULTRASOUND;  Surgeon: Tyler Pita, MD;  Location: [facility];  Service: Pulmonary;  Laterality: Left;    REVIEW OF SYSTEMS:  A comprehensive review of systems was negative except for: Constitutional: positive for fatigue Eyes: positive for visual disturbance Musculoskeletal: positive for  arthralgias   PHYSICAL EXAMINATION: General appearance: alert, cooperative, fatigued, and no distress Head: Normocephalic, without obvious abnormality, atraumatic Neck: no adenopathy, no JVD, supple, symmetrical, trachea midline, and thyroid not enlarged, symmetric, no tenderness/mass/nodules Lymph nodes: Cervical, supraclavicular, and axillary nodes normal. Resp: clear to auscultation bilaterally Back: symmetric, no curvature. ROM normal. No CVA tenderness. Cardio: regular rate and rhythm, S1, S2 normal, no murmur, click, rub or gallop GI: soft, non-tender; bowel sounds normal; no masses,  no organomegaly Extremities: extremities normal, atraumatic, no cyanosis or edema  ECOG PERFORMANCE STATUS: 1 - Symptomatic but completely ambulatory  Blood pressure 135/74, pulse 78, temperature (!) 97.2 F (36.2 C), temperature source Tympanic, resp. rate 17, weight 169 lb 7 oz (76.9 kg), SpO2 93 %.  LABORATORY DATA: Lab Results  Component Value Date   WBC 6.7 01/31/2022   HGB 12.2 01/31/2022   HCT 36.5 01/31/2022   MCV 91.7 01/31/2022   PLT 121 (L) 01/31/2022      Chemistry      Component Value Date/Time   NA 139 01/31/2022 1516  K 3.7 01/31/2022 1516   CL 105 01/31/2022 1516   CO2 27 01/31/2022 1516   BUN 26 (H) 01/31/2022 1516   CREATININE 1.45 (H) 01/31/2022 1516      Component Value Date/Time   CALCIUM 9.1 01/31/2022 1516   ALKPHOS 48 01/31/2022 1516   AST 22 01/31/2022 1516   ALT 20 01/31/2022 1516   BILITOT 0.6 01/31/2022 1516       RADIOGRAPHIC STUDIES: No results found.  ASSESSMENT AND PLAN: This is a very pleasant 76 years old white female diagnosed with a stage IV (T1c, N1, M1 C) non-small cell lung cancer, adenocarcinoma with positive EGFR mutation ([id number]) presented with locally advanced disease in the left lung including left upper lobe lung nodule in addition to left hilar adenopathy and solitary left frontal brain metastasis s/p resection followed by SRS  diagnosed in July 2022. The patient is currently treatment with Tagrisso 80 mg p.o. daily.  Her dose of Tagrisso was reduced to 40 mg p.o. daily on July 14, 2021 secondary to severe diarrhea that was not responding to Imodium or Lomotil. She is status post 6 months of treatment and has been tolerating her Tagrisso at the reduced dose much better. I recommended for her to continue her current treatment with Tagrisso with the same dose for now. For the enlarging liver lesion, she underwent SBRT under the care of Dr. Tammi Klippel. I will see her back for follow-up visit in 2 months for evaluation with repeat CT scan of the chest, abdomen and pelvis for restaging of her disease. The patient was advised to call immediately if she has any other concerning symptoms in the interval. The patient voices understanding of current disease status and treatment options and is in agreement with the current care plan.  All questions were answered. The patient knows to call the clinic with any problems, questions or concerns. We can certainly see the patient much sooner if necessary.  The total time spent in the appointment was 20 minutes.  Disclaimer: This note was dictated with voice recognition software. Similar sounding words can inadvertently be transcribed and may not be corrected upon review.

## 2022-02-01 ENCOUNTER — Other Ambulatory Visit: Payer: Self-pay | Admitting: Family Medicine

## 2022-02-01 ENCOUNTER — Encounter: Payer: Self-pay | Admitting: Family Medicine

## 2022-02-01 DIAGNOSIS — R7303 Prediabetes: Secondary | ICD-10-CM

## 2022-02-01 DIAGNOSIS — E785 Hyperlipidemia, unspecified: Secondary | ICD-10-CM

## 2022-02-01 NOTE — Progress Notes (Signed)
Left a VM for pt asking her to call our office and schedule a fasting lab appt.

## 2022-02-01 NOTE — Progress Notes (Signed)
Please call pt to schedule a fasting lab appointment  I was hoping to have her oncologist get her cholesterol and hemoglobin a1c checked, but this was not done.

## 2022-02-02 ENCOUNTER — Ambulatory Visit (HOSPITAL_COMMUNITY)
Admission: RE | Admit: 2022-02-02 | Discharge: 2022-02-02 | Disposition: A | Discharge status: Home / Self Care | Payer: Medicare HMO | Source: Ambulatory Visit | Attending: Internal Medicine | Admitting: Internal Medicine

## 2022-02-02 DIAGNOSIS — C3490 Malignant neoplasm of unspecified part of unspecified bronchus or lung: Secondary | ICD-10-CM | POA: Diagnosis not present

## 2022-02-02 DIAGNOSIS — D496 Neoplasm of unspecified behavior of brain: Secondary | ICD-10-CM | POA: Diagnosis not present

## 2022-02-02 DIAGNOSIS — G9389 Other specified disorders of brain: Secondary | ICD-10-CM | POA: Diagnosis not present

## 2022-02-02 IMAGING — MR MR HEAD WO/W CM
8 of 13 series · 20 of 48 positions shown · IV contrast (cc gad)
Comparison: MRI head with contrast [DATE]

CLINICAL DATA: Brain tumor. Non-small cell lung cancer with
metastatic disease. Assess treatment response.

EXAM:
MRI HEAD WITHOUT AND WITH CONTRAST
TECHNIQUE: Multiplanar, multiecho pulse sequences of the brain and surrounding
structures were obtained without and with intravenous contrast.
CONTRAST:  7.5mL GADAVIST GADOBUTROL 1 MMOL/ML IV SOLN

[Series 2: FLAIR · sagittal · 3.0mm · 0.47mm/px · 2 of 37 slices shown (1 of 2)]
[im 1/37]
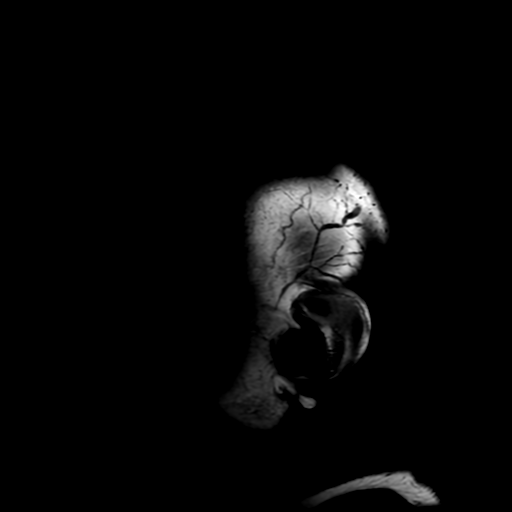
[im 37/37]
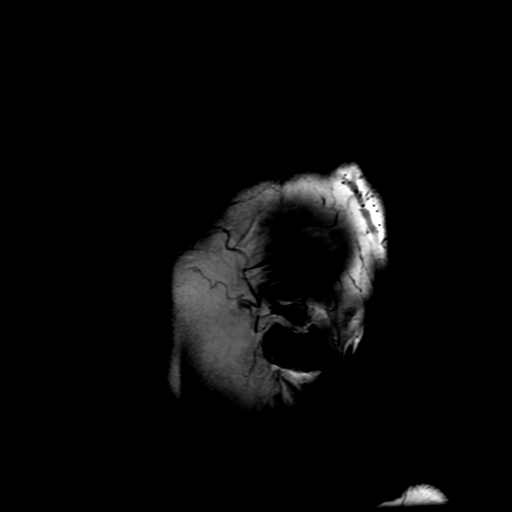

[Series 3: DWI · axial · 3.0mm · 0.94mm/px · z∈[-78,+77]mm · 3 of 106 slices shown]
[im 1/106]
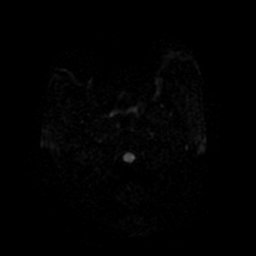
[im 53/106]
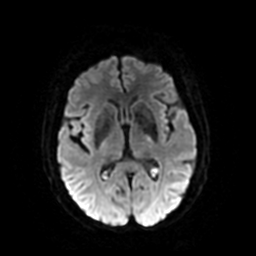
[im 106/106]
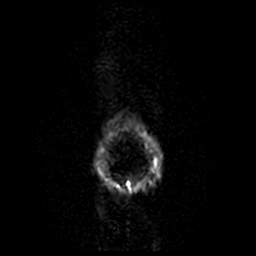

[Series 4: FLAIR · axial · 3.0mm · 0.47mm/px · z∈[-95,+67]mm · 2 of 55 slices shown (2 of 2)]
[im 1/55]
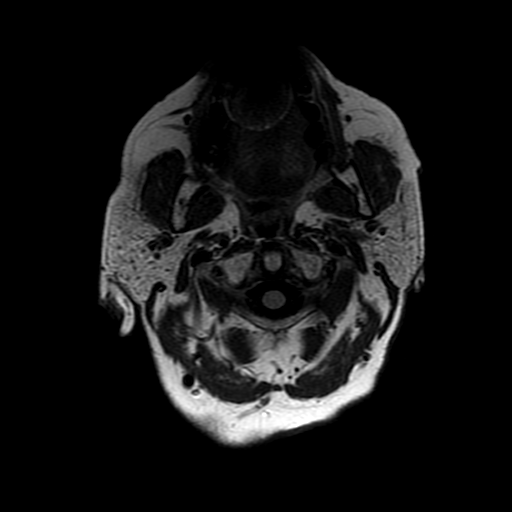
[im 55/55]
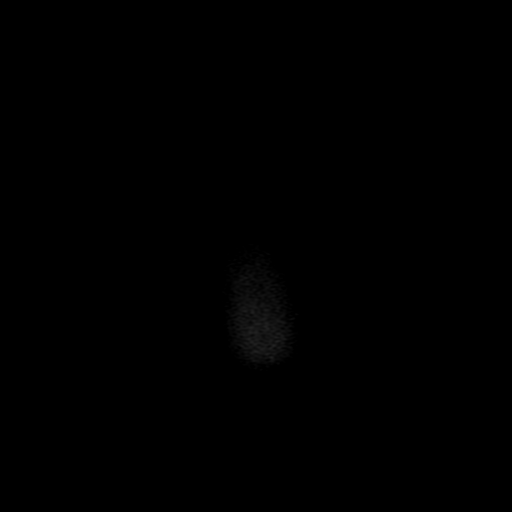

[Series 7: T2 post-contrast · coronal · 3.0mm · 0.39mm/px · 1 of 45 slices shown]
[im 1/45]
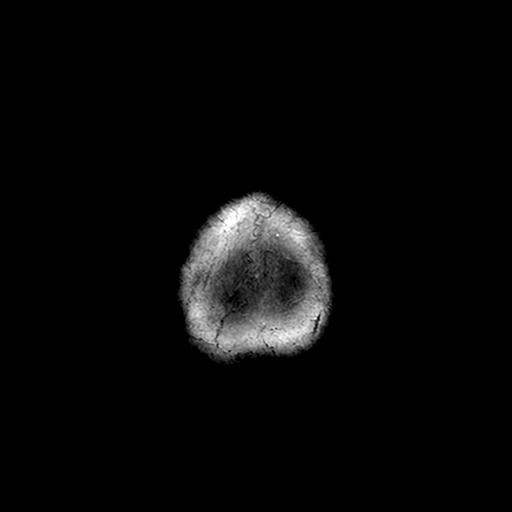

[Series 9: T1 post-contrast · coronal · 3.0mm · 0.43mm/px · 1 of 45 slices shown (1 of 2)]
[im 1/45]
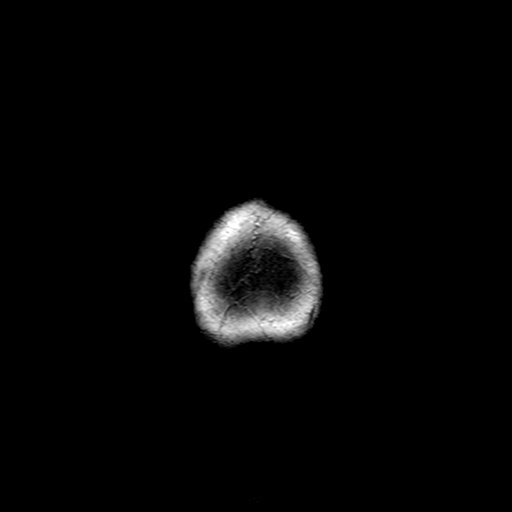

[Series 10: FLAIR post-contrast · sagittal · 3.0mm · 0.47mm/px · 1 of 37 slices shown]
[im 1/37]
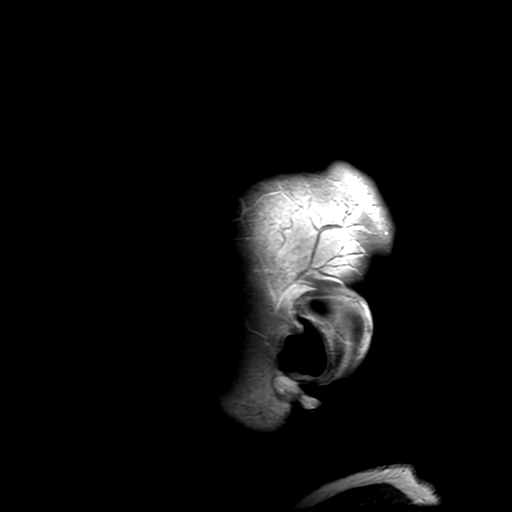

[Series 350: ADC · axial · 3.0mm · 0.94mm/px · z∈[-78,+77]mm · 2 of 53 slices shown]
[im 1/53]
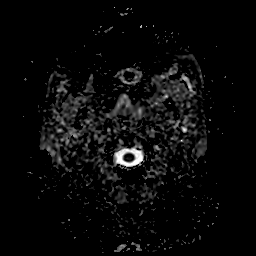
[im 53/53]
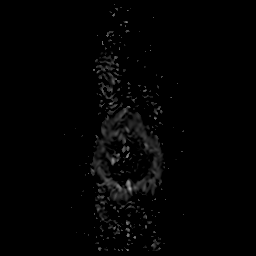

[Series 1100: T1 post-contrast · axial · 0.9mm · 0.50mm/px · z∈[-162,+92]mm · 8 of 285 slices shown (2 of 2)]
[im 1/285]
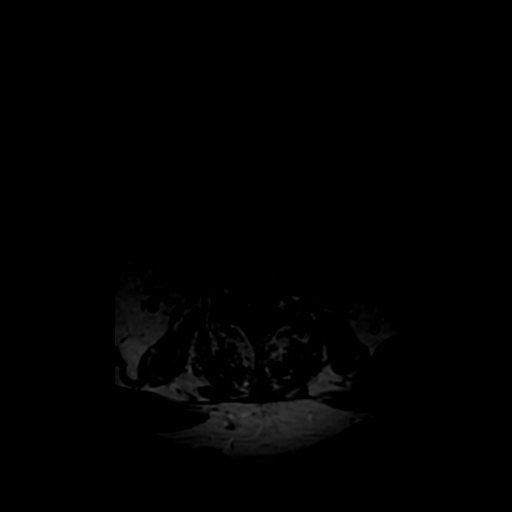
[im 41/285]
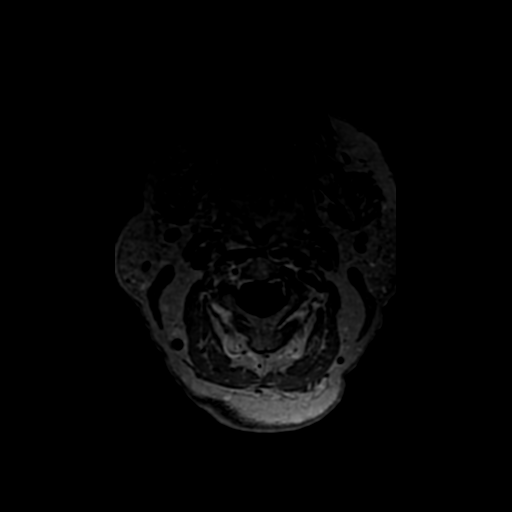
[im 82/285]
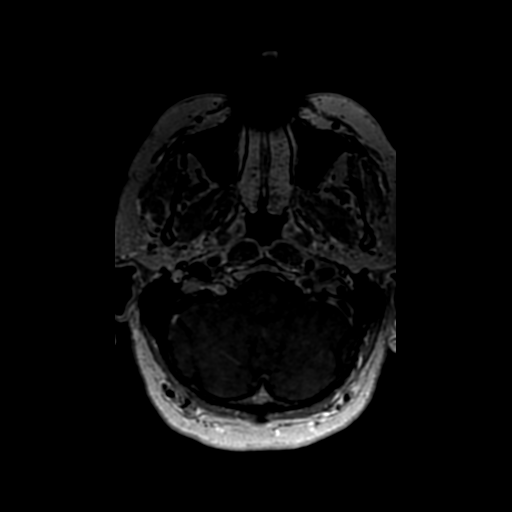
[im 122/285]
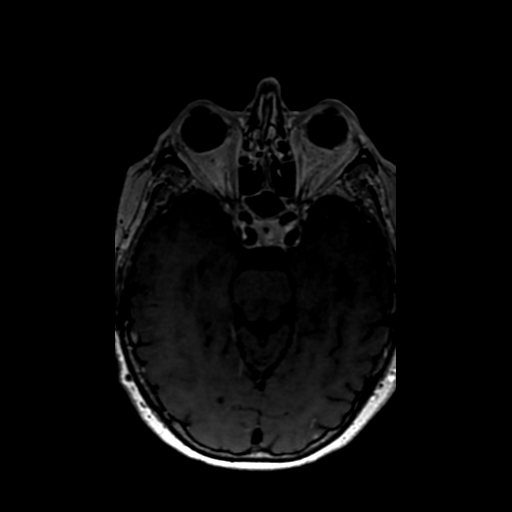
[im 163/285]
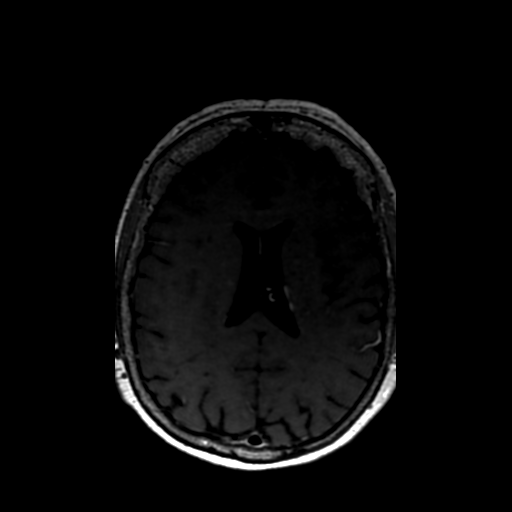
[im 203/285]
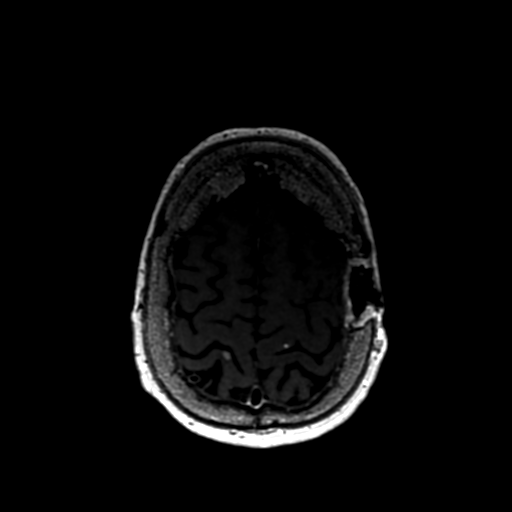
[im 244/285]
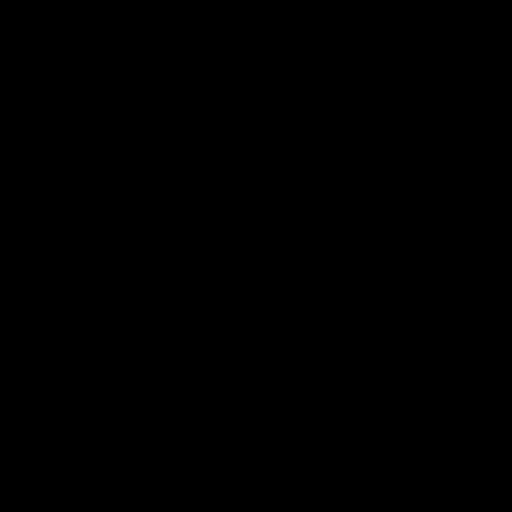
[im 285/285]
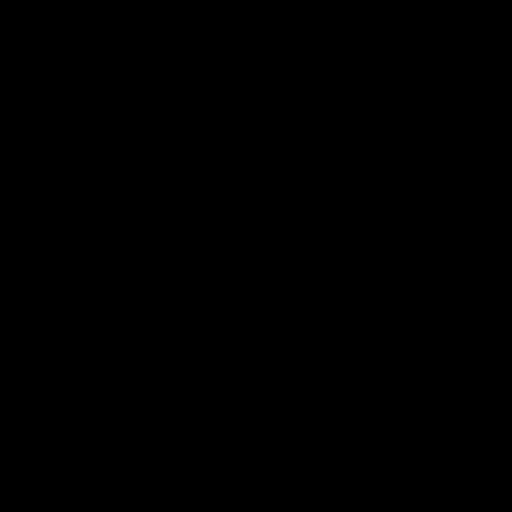

[20 of 48 positions shown; findings below may reference images not displayed]

FINDINGS: Brain:

New lesions:

3 mm lesion right temporoparietal lobe.  Axial image 143

3 mm lesion right frontal lobe posterior to the orbit. Axial image
159

5 mm lesion left basal ganglia.  Axial image 163

6 mm lesion left superior temporal lobe.  Axial image 160

3 mm lesion left superior temporal lobe.  Axial image 158

3 mm lesion right parietal lobe axial image 179

2 mm lesion right parietal lobe axial image 187

3 mm lesion right parietal lobe axial image 197

5 mm lesion right parietal lobe.  Axial image 195

2 mm lesion left parietal lobe axial image 204

4 mm lesion high right parietal lobe axial image 224

2 mm lesion left medial parietal lobe axial image 225

3 mm lesion high right frontal lobe axial image 237

Improved lesions and  stable lesions:

5 mm lesion right anterior cerebellum axial image 108 has improved
in size.

Punctate lesion left medial occipital cortex unchanged. Axial image
134

2 mm lesion left occipital lobe axial image 161, unchanged

Punctate lesion right frontal lobe are axial image 195 unchanged

6 mm enhancing lesion right frontal lobe anteriorly has improved.
Axial image 212

2 mm lesion left parietal cortex improved.  Axial image 220

Postsurgical resection site with mild postop enhancement, slightly
improved

Ventricle size normal. No midline shift. Negative for acute infarct.

Vascular: Normal arterial flow voids

Skull and upper cervical spine: Left frontal craniotomy. No skeletal
metastasis.

Sinuses/Orbits: Mucosal edema sphenoid sinus. Bilateral cataract
extraction

Other: None
IMPRESSION: 1. At least 13 new metastatic deposits are present in the brain.
2. Multiple additional treated lesions are stable smaller.

## 2022-02-02 MED ORDER — GADOBUTROL 1 MMOL/ML IV SOLN
7.5000 mL | Freq: Once | INTRAVENOUS | Status: AC | PRN
  Administered 2022-02-02: 7.5 mL via INTRAVENOUS

## 2022-02-06 ENCOUNTER — Other Ambulatory Visit: Payer: Self-pay | Admitting: Primary Care

## 2022-02-06 DIAGNOSIS — F32A Depression, unspecified: Secondary | ICD-10-CM

## 2022-02-09 NOTE — Progress Notes (Signed)
Pt said she was unable to schedule at this time but would give [name] a call back to schedule later

## 2022-02-09 NOTE — Progress Notes (Signed)
Thoracic Location of Tumor / Histology: Stage IV (T1c, N1, M1c) non-small cell lung cancer, adenocarcinoma presented with locally advanced disease in the left lung  with a left upper lobe lung nodule in addition to left hilar adenopathy   Associated Dx:   Solitary left frontal brain metastasis s/p resection of the brain tumor with SRS.  This was diagnosed in July 2022.  Dr. Julien Nordmann  CT Chest (12/10/2021)  IMPRESSION: 1. Interval enlargement of a spiculated mass of the posterior left upper lobe, consistent with worsened malignancy. 2. Subcapsular liver lesion of the anterior right lobe of the liver is slightly diminished in size, now with a halo of edema and adjacent biopsy/fiducial markers. Findings are consistent with treatment response to external beam radiation. 3. No other evidence of metastatic disease in the chest, abdomen, or pelvis. Aortic Atherosclerosis (ICD10-I70.0).  CT Abdomen Pelvis with Contrast (12/10/2021) FINDINGS: CT CHEST FINDINGS Cardiovascular: No significant vascular findings.  Normal heart size.  No pericardial effusion.   Mediastinum/Nodes: No enlarged mediastinal, hilar, or axillary lymph nodes. Thyroid gland, trachea, and esophagus demonstrate no significant findings.   Lungs/Pleura: Interval enlargement of a spiculated mass of the posterior left upper lobe, measuring 2.1 x 2.0 cm, previously 1.4 x 1.4 cm (series 6, image 39). Unchanged, bandlike scarring of the bilateral lung bases. No pleural effusion or pneumothorax.   Musculoskeletal: No chest wall mass or suspicious osseous lesions identified.   CT ABDOMEN PELVIS FINDINGS Hepatobiliary: Subcapsular liver lesion of the anterior right lobe of the liver, hepatic segment V is slightly diminished in size, measuring 2.0 x 1.5 cm, previously 2.2 x 2.2 cm, now with a halo of edema and adjacent biopsy/fiducial markers. No gallstones, gallbladder wall thickening, or biliary dilatation.   Pancreas: Unremarkable. No  pancreatic ductal dilatation or surrounding inflammatory changes.   Spleen: Normal in size without significant abnormality.   Adrenals/Urinary Tract: Adrenal glands are unremarkable. Kidneys are normal, without renal calculi, solid lesion, or hydronephrosis.  Bladder is unremarkable.   Stomach/Bowel: Stomach is within normal limits. Appendix is surgically absent. No evidence of bowel wall thickening, distention, or inflammatory changes. Descending and sigmoid diverticula.   Vascular/Lymphatic: Aortic atherosclerosis. No enlarged abdominal or pelvic lymph nodes.   Reproductive: No mass or other abnormality.   Other: No abdominal wall hernia or abnormality. No ascites.   Musculoskeletal: No acute osseous findings.   IMPRESSION: 1. Interval enlargement of a spiculated mass of the posterior left upper lobe, consistent with worsened malignancy. 2. Subcapsular liver lesion of the anterior right lobe of the liver is slightly diminished in size, now with a halo of edema and adjacent biopsy/fiducial markers. Findings are consistent with treatment response to external beam radiation. 3. No other evidence of metastatic disease in the chest, abdomen, or pelvis. Aortic Atherosclerosis (ICD10-I70.0).   MR Brain with/without Contrast (02/02/2022 FINDINGS: Brain:   New lesions:   3 mm lesion right temporoparietal lobe.  Axial image 143   3 mm lesion right frontal lobe posterior to the orbit. Axial image 159   5 mm lesion left basal ganglia.  Axial image 163   6 mm lesion left superior temporal lobe.  Axial image 160   3 mm lesion left superior temporal lobe.  Axial image 158   3 mm lesion right parietal lobe axial image 179   2 mm lesion right parietal lobe axial image 187   3 mm lesion right parietal lobe axial image 197   5 mm lesion right parietal lobe.  Axial image 195  2 mm lesion left parietal lobe axial image 204   4 mm lesion high right parietal lobe axial image 224   2 mm  lesion left medial parietal lobe axial image 225   3 mm lesion high right frontal lobe axial image 237   Improved lesions and  stable lesions:   5 mm lesion right anterior cerebellum axial image 108 has improved in size.   Punctate lesion left medial occipital cortex unchanged. Axial image 134   2 mm lesion left occipital lobe axial image 161,  unchanged   Punctate lesion right frontal lobe are axial image 195 unchanged   5 x 7 mm lesion right parietal cortex unchanged axial image 205   6 mm enhancing lesion right frontal lobe anteriorly has improved.  Axial image 212   2 mm lesion left parietal cortex improved.  Axial image 220   Postsurgical resection site with mild postop enhancement, slightly improved   Ventricle size normal. No midline shift. Negative for acute infarct.   Vascular: Normal arterial flow voids   Skull and upper cervical spine: Left frontal craniotomy. No skeletal metastasis.   Sinuses/Orbits: Mucosal edema sphenoid sinus. Bilateral cataract extraction   Other: None   IMPRESSION: 1. At least 13 new metastatic deposits are present in the brain. 2. Multiple additional treated lesions are stable smaller.   Tobacco/Marijuana/Snuff/ETOH use: Quit smoking (10/27/1989) no drug or alcohol use.  Past/Anticipated interventions by cardiothoracic surgery, if any: NA  Past/Anticipated interventions by medical oncology, if any:  Osimertinib mesylate (Tagrisso) 40 mg by mouth daily. Decadron 1 mg by mouth daily.  Signs/Symptoms Weight changes, if any: No Respiratory complaints, if any: SOB with exertion. Hemoptysis, if any: No Pain issues, if any:  Right rib pain under right breast 5/10. Numbness or weakness to extremities:No  SAFETY ISSUES: Prior radiation? Yes, SBRT/SRS Brain (03/30/2021) Pacemaker/ICD? No  Possible current pregnancy? Postmenopausal Is the patient on methotrexate? No  Current Complaints / other details:   No

## 2022-02-10 ENCOUNTER — Telehealth: Payer: Self-pay | Admitting: Internal Medicine

## 2022-02-10 NOTE — Telephone Encounter (Signed)
Scheduled per 05/22 los, patient has been called and notified of upcoming appointments.

## 2022-02-14 ENCOUNTER — Ambulatory Visit
Admission: RE | Admit: 2022-02-14 | Discharge: 2022-02-14 | Disposition: A | Discharge status: Home / Self Care | Payer: Medicare HMO | Source: Ambulatory Visit | Attending: Radiation Oncology | Admitting: Radiation Oncology

## 2022-02-14 ENCOUNTER — Other Ambulatory Visit: Payer: Self-pay

## 2022-02-14 ENCOUNTER — Ambulatory Visit
Admission: RE | Admit: 2022-02-14 | Discharge: 2022-02-14 | Disposition: A | Discharge status: Home / Self Care | Payer: Medicare HMO | Source: Ambulatory Visit | Attending: Urology | Admitting: Urology

## 2022-02-14 VITALS — BP 131/87 | HR 73 | Temp 96.8°F | Resp 18 | Ht 63.5 in | Wt 171.1 lb

## 2022-02-14 DIAGNOSIS — C3412 Malignant neoplasm of upper lobe, left bronchus or lung: Secondary | ICD-10-CM | POA: Insufficient documentation

## 2022-02-14 DIAGNOSIS — C7931 Secondary malignant neoplasm of brain: Secondary | ICD-10-CM | POA: Insufficient documentation

## 2022-02-14 DIAGNOSIS — C3490 Malignant neoplasm of unspecified part of unspecified bronchus or lung: Secondary | ICD-10-CM

## 2022-02-14 DIAGNOSIS — Z87891 Personal history of nicotine dependence: Secondary | ICD-10-CM | POA: Diagnosis not present

## 2022-02-14 DIAGNOSIS — C787 Secondary malignant neoplasm of liver and intrahepatic bile duct: Secondary | ICD-10-CM | POA: Diagnosis present

## 2022-02-14 NOTE — Progress Notes (Signed)
Radiation Oncology         [phone] ________________________________  Outpatient Re-Consultation  Name: Marie Hensley MRN: [id number]  Date of Service: 02/14/2022 DOB: 13-Aug-1946  EX:NTZG, Jobe Marker, MD  Curt Bears, MD   REFERRING PHYSICIAN: Curt Bears, MD  DIAGNOSIS: 76 yo woman with Stage IV (T1c, N1, M1c) non-small cell lung cancer, adenocarcinoma of the left lung with more than 13 new brain metastases    ICD-10-CM   1. Malignant neoplasm metastatic to brain [facility])  C79.31       HISTORY OF PRESENT ILLNESS: Marie Hensley is a 76 y.o. female is well-known to our service, having previously completed pre-op SRS prior to resection of a solitary brain met in July 2022.  She tolerated her treatment well but did have a breakthrough seizure on 05/09/2021, despite taking Keppra 500 mg p.o. twice daily. She was evaluated with Dr. Mickeal Skinner who increased her Keppra to 1000 mg p.o. twice daily and added Decadron 4 mg p.o. twice daily for 3 days and then daily thereafter. Her speech improved significantly with the Decadron and she has continued on the low-dose 1 mg daily. She has not had any further seizure activity since increasing the dose of Keppra and has continued making good progress with speech therapy and her strength on the right side remains stable.  She was started on targeted therapy with Tagrisso on 05/27/2021 but was unable to tolerate the full dose at 80 mg due to severe diarrhea and dehydration.  She was off the medication for a month or more with significant improvement in the diarrhea.  She was was able to resume the Tagrisso at a reduced dose of 40 mg daily and has tolerated the reduced dose well.  Her posttreatment MRI brain on 07/08/2021 appeared stable with postoperative changes only and no new or progressive disease. Her CT C/A/P for disease restaging on 07/19/2021 showed no significant change in the appearance of the left upper lobe lung nodule but there was a new,  indeterminate, low-attenuation structure within the periphery of the right hepatic lobe liver measuring 1.8 x 1.0 cm, suspicious for a new site of disease.  A liver MRI was performed on 08/12/2021 for further evaluation and demonstrated a 1.8 cm hypervascular mass in the anterior right hepatic lobe of the liver with mistakes that were nonspecific but suspicious for possible early liver metastasis.  No other evidence of metastatic disease within the abdomen.  Her restaging CT C/A/P on 09/29/2021 showed an unchanged appearance of the left posterior upper lobe lesion but significant enlargement of the mass in the anterior right lobe of the liver, measuring 2.2 x 2.2 cm as compared to 1.8 x 1.4 cm previously. Additionally, her MRI brain scan on 10/08/2021 also showed progressive disease with 6 new lesions with only mild associated edema. We saw the patient back on 10/13/2021 to discuss treatment to the enlarging liver lesion and the new brain metastases. She ultimately proceeded with a single fraction of SRS to the new brain metastases and 5 fractions of SBRT to the liver lesion.  Since her last visit, she underwent another restaging CT C/A/P on 12/10/2021 showing a decrease in size of the recently treated liver lesion but interval enlargement of a spiculated mass of the posterior left upper lobe lung, now measuring 2.1 x 2.0 cm as compared to 1.4 x 1.4 cm previously. She was referred back to [name] for radiotherapy to the enlarging lesion and received 54 Gy in 3 fractions.  Most recently,  brain MRI for surveillance on 02/02/22, showed at least 13 new brain metastases.  She returns today to review the results and consider treatment options.  PREVIOUS RADIATION THERAPY: Yes  01/2022 - left upper lobe lung,  received 54 Gy in 3 fractions.  04/06/2021//Pre-Op SRS: PTV1: The solitary brain metastasis in the left frontal lobe was treated to 20 Gray in a single fraction. Site Technique Total Dose (Gy) Dose per Fx (Gy)  Completed Fx Beam Energies  Brain: Brain_SRS PTV_1 Lt Frontal 23m IMRT 20/20 20 1/1 6XFFF   10/20/21, SRS: These 6 targets were treated with single isocenter using 7 Rapid Arc VMAT Beams to a prescription dose of 20 Gy.  11/10/21 - 11/19/21: The liver target was treated to 50 Gy in 5 fractions of 10 Gy  PAST MEDICAL HISTORY:  Past Medical History:  Diagnosis Date   Arthritis    Brain tumor ([place]    Depression    Dyspnea    WITH EXERTION-SEEING DR GPatsey Berthold  HTN (hypertension)    Hyperlipemia    Seizures ([facility]       PAST SURGICAL HISTORY: Past Surgical History:  Procedure Laterality Date   APPENDECTOMY  [id number]  APPLICATION OF CRANIAL NAVIGATION N/A 04/08/2021   Procedure: APPLICATION OF CRANIAL NAVIGATION;  Surgeon: OJudith Part MD;  [place] [facility] [facility] [place]  Service: Neurosurgery;  Laterality: N/A;  RM 21   CRANIOTOMY Left 04/08/2021   Procedure: Left Craniotomy for tumor resection with brainlab;  Surgeon: OJudith Part MD;  [place] [facility]  Service: Neurosurgery;  Laterality: Left;   EYE SURGERY Bilateral    CATARACTS   OOPHORECTOMY Right    VIDEO BRONCHOSCOPY WITH ENDOBRONCHIAL NAVIGATION Left 12/23/2020   Procedure: VIDEO BRONCHOSCOPY WITH ENDOBRONCHIAL NAVIGATION;  Surgeon: GTyler Pita MD;  Location: [facility];  Service: Pulmonary;  Laterality: Left;   VIDEO BRONCHOSCOPY WITH ENDOBRONCHIAL NAVIGATION Left 02/01/2021   Procedure: ROBOTIC ASSISTED VIDEO BRONCHOSCOPY WITH ENDOBRONCHIAL NAVIGATION;  Surgeon: GTyler Pita MD;  Location: [facility];  Service: Pulmonary;  Laterality: Left;   VIDEO BRONCHOSCOPY WITH ENDOBRONCHIAL ULTRASOUND Left 12/23/2020   Procedure: VIDEO BRONCHOSCOPY WITH ENDOBRONCHIAL ULTRASOUND;  Surgeon: GTyler Pita MD;  Location: [facility];  Service: Pulmonary;  Laterality: Left;   VIDEO BRONCHOSCOPY WITH ENDOBRONCHIAL ULTRASOUND Left 02/01/2021   Procedure: ROBOTIC ASSITSTED VIDEO BRONCHOSCOPY WITH ENDOBRONCHIAL ULTRASOUND;  Surgeon:  GTyler Pita MD;  Location: [facility];  Service: Pulmonary;  Laterality: Left;    FAMILY HISTORY:  Family History  Problem Relation Age of Onset   Addison's disease Daughter    Hypertension Mother        died at 145  Alcohol abuse Father     SOCIAL HISTORY:  Social History   Socioeconomic History   Marital status: Single    Spouse name: Not on file   Number of children: 3   Years of education: high school    Highest education level: Not on file  Occupational History   Occupation: retired  Tobacco Use   Smoking status: Former    Packs/day: 0.50    Years: 20.00    Pack years: 10.00    Types: Cigarettes    Quit date: 10/27/1989    Years since quitting: 32.3   Smokeless tobacco: Never   Tobacco comments:    Smoked off and on in 20 year period  Vaping Use   Vaping Use: Never used  Substance and Sexual Activity   Alcohol use: Not Currently   Drug use:  Never   Sexual activity: Not Currently    Birth control/protection: Post-menopausal  Other Topics Concern   Not on file  Social History Narrative   She has discussed advanced directives with her.  She would want CPR and intubation if chance of recovery.   11/19/20   From: [place], moved for work 1998   Living: alone   Work: retired Energy manager of [employer]: 3 children - [name], [name], [name] - 5 grandchildren, 1 great-grandchild      Enjoys: shop, casino, cruise travel      Exercise: not as much due to the fatigue   Diet: tries to eat healthy, but eats what she wants      Safety   Seat belts: Yes    Guns: No   Safe in relationships: Yes    Social Determinants of Radio broadcast assistant Strain: Low Risk    Difficulty of Paying Living Expenses: Not hard at all  Food Insecurity: No Food Insecurity   Worried About Charity fundraiser in the Last Year: Never true   Arboriculturist in the Last Year: Never true  Transportation Needs: No Transportation Needs   Lack of Transportation (Medical): No    Lack of Transportation (Non-Medical): No  Physical Activity: Insufficiently Active   Days of Exercise per Week: 3 days   Minutes of Exercise per Session: 20 min  Stress: No Stress Concern Present   Feeling of Stress : Not at all  Social Connections: Socially Isolated   Frequency of Communication with Friends and Family: More than three times a week   Frequency of Social Gatherings with Friends and Family: Three times a week   Attends Religious Services: Never   Active Member of Clubs or Organizations: No   Attends Archivist Meetings: Never   Marital Status: Never married  Human resources officer Violence: Not At Risk   Fear of Current or Ex-Partner: No   Emotionally Abused: No   Physically Abused: No   Sexually Abused: No    ALLERGIES: Patient has no known allergies.  MEDICATIONS:  Current Outpatient Medications  Medication Sig Dispense Refill   citalopram (CELEXA) 20 MG tablet TAKE ONE TABLET BY MOUTH AT BEDTIME FOR DEPRESSION 90 tablet 0   acetaminophen (TYLENOL) 500 MG tablet Take 1,000 mg by mouth every 6 (six) hours as needed for mild pain (or arthritis).     atorvastatin (LIPITOR) 40 MG tablet Take 1 tablet (40 mg total) by mouth at bedtime. 90 tablet 3   cetirizine (ZYRTEC) 10 MG tablet Take 10 mg by mouth daily as needed for allergies or rhinitis.     dexamethasone (DECADRON) 1 MG tablet TAKE ONE TABLET BY MOUTH ONE TIME DAILY 30 tablet 3   diphenoxylate-atropine (LOMOTIL) 2.5-0.025 MG tablet Take 1 tablet by mouth 4 (four) times daily as needed for diarrhea or loose stools. 30 tablet 0   fluticasone (FLONASE) 50 MCG/ACT nasal spray Place 1 spray into both nostrils daily as needed for allergies.     levETIRAcetam (KEPPRA) 500 MG tablet TAKE TWO TABLETS BY MOUTH TWICE A DAY 120 tablet 3   loperamide (IMODIUM A-D) 2 MG tablet Take 2 mg by mouth 2 (two) times daily as needed for diarrhea or loose stools.     osimertinib mesylate (TAGRISSO) 40 MG tablet Take 1 tablet (40  mg total) by mouth daily. 30 tablet 2   oxyCODONE (OXY IR/ROXICODONE) 5 MG immediate release  tablet Take 1 tablet (5 mg total) by mouth 3 (three) times daily as needed for severe pain. 20 tablet 0   prochlorperazine (COMPAZINE) 10 MG tablet Take 1 tablet (10 mg total) by mouth every 6 (six) hours as needed for nausea or vomiting. 30 tablet 0   propranolol ER (INDERAL LA) 60 MG 24 hr capsule Take 1 capsule (60 mg total) by mouth at bedtime. (Patient not taking: Reported on 11/11/2021) 90 capsule 3   No current facility-administered medications for this encounter.    REVIEW OF SYSTEMS:  On review of systems, the patient reports that she is doing well overall. She continues to struggle with dysphasia but improved with speech therapy. She denies headaches, visual changes, tremor or focal weakness. She denies any chest pain, cough, fevers, chills, night sweats, or recent unintended weight changes. She reports some shortness of breath with exertion, unchanged recently. She denies any bowel or bladder disturbances, and denies abdominal pain, nausea or vomiting. She denies any new musculoskeletal or joint aches or pains. A complete review of systems is obtained and is otherwise negative.    PHYSICAL EXAM:  Wt Readings from Last 3 Encounters:  02/14/22 171 lb 2 oz (77.6 kg)  01/31/22 169 lb 7 oz (76.9 kg)  01/03/22 168 lb 2 oz (76.3 kg)   Temp Readings from Last 3 Encounters:  02/14/22 (!) 96.8 F (36 C) (Temporal)  01/31/22 (!) 97.2 F (36.2 C) (Tympanic)  01/03/22 98 F (36.7 C) (Oral)   BP Readings from Last 3 Encounters:  02/14/22 131/87  01/31/22 135/74  01/03/22 (!) 100/50   Pulse Readings from Last 3 Encounters:  02/14/22 73  01/31/22 78  01/03/22 78   Pain Assessment Pain Score: 5  Pain Loc: Rib Cage (right)/10  In general this is a well appearing Caucasian female in no acute distress. She's alert and oriented x4 and appropriate throughout the examination. Cardiopulmonary  assessment is negative for acute distress and she exhibits normal effort.    KPS = 90  100 - Normal; no complaints; no evidence of disease. 90   - Able to carry on normal activity; minor signs or symptoms of disease. 80   - Normal activity with effort; some signs or symptoms of disease. 82   - Cares for self; unable to carry on normal activity or to do active work. 60   - Requires occasional assistance, but is able to care for most of his personal needs. 50   - Requires considerable assistance and frequent medical care. 88   - Disabled; requires special care and assistance. 74   - Severely disabled; hospital admission is indicated although death not imminent. 53   - Very sick; hospital admission necessary; active supportive treatment necessary. 10   - Moribund; fatal processes progressing rapidly. 0     - Dead  Karnofsky DA, Abelmann [place], Craver LS and Burchenal [facility] [phone]) The use of the nitrogen mustards in the palliative treatment of carcinoma: with particular reference to bronchogenic carcinoma Cancer 1 634-56  LABORATORY DATA:  Lab Results  Component Value Date   WBC 6.7 01/31/2022   HGB 12.2 01/31/2022   HCT 36.5 01/31/2022   MCV 91.7 01/31/2022   PLT 121 (L) 01/31/2022   Lab Results  Component Value Date   NA 139 01/31/2022   K 3.7 01/31/2022   CL 105 01/31/2022   CO2 27 01/31/2022   Lab Results  Component Value Date   ALT 20 01/31/2022   AST 22 01/31/2022  ALKPHOS 48 01/31/2022   BILITOT 0.6 01/31/2022     RADIOGRAPHY: MR Brain W Wo Contrast  Result Date: 02/02/2022 CLINICAL DATA:  Brain tumor. Non-small cell lung cancer with metastatic disease. Assess treatment response. EXAM: MRI HEAD WITHOUT AND WITH CONTRAST TECHNIQUE: Multiplanar, multiecho pulse sequences of the brain and surrounding structures were obtained without and with intravenous contrast. CONTRAST:  7.41m GADAVIST GADOBUTROL 1 MMOL/ML IV SOLN COMPARISON:  MRI head with contrast 10/08/2021 FINDINGS:  Brain: New lesions: 3 mm lesion right temporoparietal lobe.  Axial image 143 3 mm lesion right frontal lobe posterior to the orbit. Axial image 159 5 mm lesion left basal ganglia.  Axial image 163 6 mm lesion left superior temporal lobe.  Axial image 160 3 mm lesion left superior temporal lobe.  Axial image 158 3 mm lesion right parietal lobe axial image 179 2 mm lesion right parietal lobe axial image 187 3 mm lesion right parietal lobe axial image 197 5 mm lesion right parietal lobe.  Axial image 195 2 mm lesion left parietal lobe axial image 204 4 mm lesion high right parietal lobe axial image 224 2 mm lesion left medial parietal lobe axial image 225 3 mm lesion high right frontal lobe axial image 237 Improved lesions and  stable lesions: 5 mm lesion right anterior cerebellum axial image 108 has improved in size. Punctate lesion left medial occipital cortex unchanged. Axial image 134 2 mm lesion left occipital lobe axial image 161, unchanged Punctate lesion right frontal lobe are axial image 195 unchanged 5 x 7 mm lesion right parietal cortex unchanged axial image 205 6 mm enhancing lesion right frontal lobe anteriorly has improved. Axial image 212 2 mm lesion left parietal cortex improved.  Axial image 220 Postsurgical resection site with mild postop enhancement, slightly improved Ventricle size normal. No midline shift. Negative for acute infarct. Vascular: Normal arterial flow voids Skull and upper cervical spine: Left frontal craniotomy. No skeletal metastasis. Sinuses/Orbits: Mucosal edema sphenoid sinus. Bilateral cataract extraction Other: None IMPRESSION: 1. At least 13 new metastatic deposits are present in the brain. 2. Multiple additional treated lesions are stable smaller. Electronically Signed   By: CFranchot GalloM.D.   On: 02/02/2022 19:38      IMPRESSION/PLAN:  1. 76y.o. with enlarging left upper lobe lung lesion, Stage IV (T1c, N1, M1c) non-small cell lung cancer, adenocarcinoma of the left  lung with at least 13 new brain metastases.  At this point, the patient would potentially benefit from radiotherapy. The options include whole brain irradiation versus stereotactic radiosurgery. There are pros and cons associated with each of these potential treatment options. Whole brain radiotherapy would treat the known metastatic deposits and help provide some reduction of risk for future brain metastases.  Stereotactic radiosurgery carries a higher likelihood for local tumor control at the targeted sites with lower associated risk for neurocognitive changes such as memory loss. However, the use of stereotactic radiosurgery in this setting may leave the patient at increased risk for new brain metastases elsewhere in the brain as high as 50-60%. Accordingly, patients who receive stereotactic radiosurgery in this setting should undergo ongoing surveillance imaging with brain MRI more frequently in order to identify and treat new small brain metastases before they become symptomatic. Stereotactic radiosurgery does carry some different risks, including a risk of radionecrosis.  At this point, I think this patient would be an excellent candidate for whole brain radiotherapy to be followed by surveillance MRIs reserving additional SRS for salvage.  She  is in agreement and will proceed with CT Simulation today.  We personally spent 45 minutes in this encounter including chart review, reviewing radiological studies, meeting face-to-face with the patient, entering orders and completing documentation.      Tyler Pita, MD  [facility]  Radiation Oncology Direct Dial: [phone]  Fax: [phone] [email or web address]  Skype  LinkedIn   This document serves as a record of services personally performed by Tyler Pita, MD and Freeman Caldron, PA-C. It was created on their behalf by Wilburn Mylar, a trained medical scribe. The creation of this record is based on the scribe's personal observations  and the provider's statements to them. This document has been checked and approved by the attending provider.

## 2022-02-14 NOTE — Progress Notes (Signed)
  Radiation Oncology         [phone] ________________________________  Name: Marie Hensley MRN: [id number]  Date: 02/14/2022  DOB: [birth year]  SIMULATION AND TREATMENT PLANNING NOTE    ICD-10-CM   1. Primary adenocarcinoma of left upper lobe of lung ([place])  C34.12     2. Malignant neoplasm metastatic to brain ([place])  C79.31     3. Non-small cell lung cancer metastatic to liver ([facility])  C34.90    C78.7       DIAGNOSIS:  76 yo woman with Stage IV (T1c, N1, M1c) non-small cell lung cancer, adenocarcinoma of the left lung with more than 13 new brain metastases  NARRATIVE:  The patient was brought to the [place].  Identity was confirmed.  All relevant records and images related to the planned course of therapy were reviewed.  The patient freely provided informed written consent to proceed with treatment after reviewing the details related to the planned course of therapy. The consent form was witnessed and verified by the simulation staff.  Then, the patient was set-up in a stable reproducible  supine position for radiation therapy.  CT images were obtained.  Surface markings were placed.  The CT images were loaded into the planning software.  Then the target and avoidance structures were contoured.  Treatment planning then occurred.  The radiation prescription was entered and confirmed.  Then, I designed and supervised the construction of a total of 3 medically necessary complex treatment device including a custom made thermoplastic mask used for immobilization, and two MLC collimator apertures for radiotherapy from the right and left side, with independent collimation for each to account for beam divergence.  I have requested : Isodose Plan.    PLAN:  The whole brain will be treated to 30 Gy in 10 fractions.  ________________________________  Sheral Apley Tammi Klippel, M.D.

## 2022-02-17 ENCOUNTER — Other Ambulatory Visit: Payer: Self-pay | Admitting: Medical Oncology

## 2022-02-17 DIAGNOSIS — C787 Secondary malignant neoplasm of liver and intrahepatic bile duct: Secondary | ICD-10-CM | POA: Diagnosis not present

## 2022-02-17 DIAGNOSIS — C3412 Malignant neoplasm of upper lobe, left bronchus or lung: Secondary | ICD-10-CM

## 2022-02-17 DIAGNOSIS — C7931 Secondary malignant neoplasm of brain: Secondary | ICD-10-CM | POA: Diagnosis not present

## 2022-02-17 DIAGNOSIS — Z87891 Personal history of nicotine dependence: Secondary | ICD-10-CM | POA: Diagnosis not present

## 2022-02-17 DIAGNOSIS — C3490 Malignant neoplasm of unspecified part of unspecified bronchus or lung: Secondary | ICD-10-CM | POA: Diagnosis not present

## 2022-02-17 MED ORDER — OSIMERTINIB MESYLATE 40 MG PO TABS: 40.0000 mg | ORAL_TABLET | Freq: Every day | ORAL | 5 refills | Status: AC

## 2022-02-21 ENCOUNTER — Telehealth: Payer: Self-pay | Admitting: Medical Oncology

## 2022-02-21 NOTE — Telephone Encounter (Signed)
Appts moved out to 2 months per LOS. Pt is in the middle of xrt. Dtr aware. Schedule message sent.

## 2022-02-22 ENCOUNTER — Ambulatory Visit
Admission: RE | Admit: 2022-02-22 | Discharge: 2022-02-22 | Disposition: A | Discharge status: Home / Self Care | Payer: Medicare HMO | Source: Ambulatory Visit | Attending: Radiation Oncology | Admitting: Radiation Oncology

## 2022-02-22 ENCOUNTER — Other Ambulatory Visit: Payer: Self-pay

## 2022-02-22 DIAGNOSIS — C7931 Secondary malignant neoplasm of brain: Secondary | ICD-10-CM | POA: Diagnosis not present

## 2022-02-22 DIAGNOSIS — Z510 Encounter for antineoplastic radiation therapy: Secondary | ICD-10-CM | POA: Diagnosis not present

## 2022-02-22 DIAGNOSIS — C3490 Malignant neoplasm of unspecified part of unspecified bronchus or lung: Secondary | ICD-10-CM | POA: Diagnosis not present

## 2022-02-22 DIAGNOSIS — C3412 Malignant neoplasm of upper lobe, left bronchus or lung: Secondary | ICD-10-CM | POA: Diagnosis not present

## 2022-02-22 DIAGNOSIS — Z87891 Personal history of nicotine dependence: Secondary | ICD-10-CM | POA: Diagnosis not present

## 2022-02-22 DIAGNOSIS — C787 Secondary malignant neoplasm of liver and intrahepatic bile duct: Secondary | ICD-10-CM | POA: Diagnosis not present

## 2022-02-22 LAB — RAD ONC ARIA SESSION SUMMARY
Course Elapsed Days: 0
Plan Fractions Treated to Date: 1
Plan Prescribed Dose Per Fraction: 3 Gy
Plan Total Fractions Prescribed: 10
Plan Total Prescribed Dose: 30 Gy
Reference Point Dosage Given to Date: 3 Gy
Reference Point Session Dosage Given: 3 Gy
Session Number: 1

## 2022-02-23 ENCOUNTER — Other Ambulatory Visit: Payer: Self-pay

## 2022-02-23 ENCOUNTER — Ambulatory Visit
Admission: RE | Admit: 2022-02-23 | Discharge: 2022-02-23 | Disposition: A | Discharge status: Home / Self Care | Payer: Medicare HMO | Source: Ambulatory Visit | Attending: Radiation Oncology | Admitting: Radiation Oncology

## 2022-02-23 DIAGNOSIS — C3412 Malignant neoplasm of upper lobe, left bronchus or lung: Secondary | ICD-10-CM | POA: Diagnosis not present

## 2022-02-23 DIAGNOSIS — C787 Secondary malignant neoplasm of liver and intrahepatic bile duct: Secondary | ICD-10-CM | POA: Diagnosis not present

## 2022-02-23 DIAGNOSIS — C7931 Secondary malignant neoplasm of brain: Secondary | ICD-10-CM | POA: Diagnosis not present

## 2022-02-23 DIAGNOSIS — C3490 Malignant neoplasm of unspecified part of unspecified bronchus or lung: Secondary | ICD-10-CM | POA: Diagnosis not present

## 2022-02-23 LAB — RAD ONC ARIA SESSION SUMMARY
Course Elapsed Days: 1
Plan Fractions Treated to Date: 2
Plan Prescribed Dose Per Fraction: 3 Gy
Plan Total Fractions Prescribed: 10
Plan Total Prescribed Dose: 30 Gy
Reference Point Dosage Given to Date: 6 Gy
Reference Point Session Dosage Given: 3 Gy
Session Number: 2

## 2022-02-24 ENCOUNTER — Ambulatory Visit
Admission: RE | Admit: 2022-02-24 | Discharge: 2022-02-24 | Disposition: A | Discharge status: Home / Self Care | Payer: Medicare HMO | Source: Ambulatory Visit | Attending: Radiation Oncology | Admitting: Radiation Oncology

## 2022-02-24 ENCOUNTER — Other Ambulatory Visit: Payer: Self-pay

## 2022-02-24 DIAGNOSIS — C7931 Secondary malignant neoplasm of brain: Secondary | ICD-10-CM | POA: Diagnosis not present

## 2022-02-24 DIAGNOSIS — C3490 Malignant neoplasm of unspecified part of unspecified bronchus or lung: Secondary | ICD-10-CM | POA: Diagnosis not present

## 2022-02-24 DIAGNOSIS — C3412 Malignant neoplasm of upper lobe, left bronchus or lung: Secondary | ICD-10-CM | POA: Diagnosis not present

## 2022-02-24 DIAGNOSIS — C787 Secondary malignant neoplasm of liver and intrahepatic bile duct: Secondary | ICD-10-CM | POA: Diagnosis not present

## 2022-02-24 LAB — RAD ONC ARIA SESSION SUMMARY
Course Elapsed Days: 2
Plan Fractions Treated to Date: 3
Plan Prescribed Dose Per Fraction: 3 Gy
Plan Total Fractions Prescribed: 10
Plan Total Prescribed Dose: 30 Gy
Reference Point Dosage Given to Date: 9 Gy
Reference Point Session Dosage Given: 3 Gy
Session Number: 3

## 2022-02-25 ENCOUNTER — Ambulatory Visit
Admission: RE | Admit: 2022-02-25 | Discharge: 2022-02-25 | Disposition: A | Discharge status: Home / Self Care | Payer: Medicare HMO | Source: Ambulatory Visit | Attending: Radiation Oncology | Admitting: Radiation Oncology

## 2022-02-25 ENCOUNTER — Ambulatory Visit: Payer: Medicare HMO

## 2022-02-25 ENCOUNTER — Other Ambulatory Visit: Payer: Self-pay

## 2022-02-25 DIAGNOSIS — C7931 Secondary malignant neoplasm of brain: Secondary | ICD-10-CM | POA: Diagnosis not present

## 2022-02-25 DIAGNOSIS — C787 Secondary malignant neoplasm of liver and intrahepatic bile duct: Secondary | ICD-10-CM | POA: Diagnosis not present

## 2022-02-25 DIAGNOSIS — C3412 Malignant neoplasm of upper lobe, left bronchus or lung: Secondary | ICD-10-CM | POA: Diagnosis not present

## 2022-02-25 DIAGNOSIS — C3490 Malignant neoplasm of unspecified part of unspecified bronchus or lung: Secondary | ICD-10-CM | POA: Diagnosis not present

## 2022-02-25 LAB — RAD ONC ARIA SESSION SUMMARY
Course Elapsed Days: 3
Plan Fractions Treated to Date: 4
Plan Prescribed Dose Per Fraction: 3 Gy
Plan Total Fractions Prescribed: 10
Plan Total Prescribed Dose: 30 Gy
Reference Point Dosage Given to Date: 12 Gy
Reference Point Session Dosage Given: 3 Gy
Session Number: 4

## 2022-02-28 ENCOUNTER — Other Ambulatory Visit: Payer: Self-pay

## 2022-02-28 ENCOUNTER — Inpatient Hospital Stay: Payer: Medicare HMO

## 2022-02-28 ENCOUNTER — Ambulatory Visit
Admission: RE | Admit: 2022-02-28 | Discharge: 2022-02-28 | Disposition: A | Discharge status: Home / Self Care | Payer: Medicare HMO | Source: Ambulatory Visit | Attending: Radiation Oncology | Admitting: Radiation Oncology

## 2022-02-28 DIAGNOSIS — C3490 Malignant neoplasm of unspecified part of unspecified bronchus or lung: Secondary | ICD-10-CM | POA: Diagnosis not present

## 2022-02-28 DIAGNOSIS — C3412 Malignant neoplasm of upper lobe, left bronchus or lung: Secondary | ICD-10-CM | POA: Diagnosis not present

## 2022-02-28 DIAGNOSIS — Z510 Encounter for antineoplastic radiation therapy: Secondary | ICD-10-CM | POA: Diagnosis not present

## 2022-02-28 DIAGNOSIS — C7931 Secondary malignant neoplasm of brain: Secondary | ICD-10-CM | POA: Diagnosis not present

## 2022-02-28 DIAGNOSIS — Z87891 Personal history of nicotine dependence: Secondary | ICD-10-CM | POA: Diagnosis not present

## 2022-02-28 DIAGNOSIS — C787 Secondary malignant neoplasm of liver and intrahepatic bile duct: Secondary | ICD-10-CM | POA: Diagnosis not present

## 2022-02-28 LAB — RAD ONC ARIA SESSION SUMMARY
Course Elapsed Days: 6
Plan Fractions Treated to Date: 5
Plan Prescribed Dose Per Fraction: 3 Gy
Plan Total Fractions Prescribed: 10
Plan Total Prescribed Dose: 30 Gy
Reference Point Dosage Given to Date: 15 Gy
Reference Point Session Dosage Given: 3 Gy
Session Number: 5

## 2022-03-01 ENCOUNTER — Ambulatory Visit (HOSPITAL_COMMUNITY): Payer: Medicare HMO

## 2022-03-01 ENCOUNTER — Ambulatory Visit
Admission: RE | Admit: 2022-03-01 | Discharge: 2022-03-01 | Disposition: A | Discharge status: Home / Self Care | Payer: Medicare HMO | Source: Ambulatory Visit | Attending: Radiation Oncology | Admitting: Radiation Oncology

## 2022-03-01 ENCOUNTER — Other Ambulatory Visit: Payer: Self-pay

## 2022-03-01 DIAGNOSIS — C3490 Malignant neoplasm of unspecified part of unspecified bronchus or lung: Secondary | ICD-10-CM | POA: Diagnosis not present

## 2022-03-01 DIAGNOSIS — C7931 Secondary malignant neoplasm of brain: Secondary | ICD-10-CM | POA: Diagnosis not present

## 2022-03-01 DIAGNOSIS — C787 Secondary malignant neoplasm of liver and intrahepatic bile duct: Secondary | ICD-10-CM | POA: Diagnosis not present

## 2022-03-01 DIAGNOSIS — C3412 Malignant neoplasm of upper lobe, left bronchus or lung: Secondary | ICD-10-CM | POA: Diagnosis not present

## 2022-03-01 LAB — RAD ONC ARIA SESSION SUMMARY
Course Elapsed Days: 7
Plan Fractions Treated to Date: 6
Plan Prescribed Dose Per Fraction: 3 Gy
Plan Total Fractions Prescribed: 10
Plan Total Prescribed Dose: 30 Gy
Reference Point Dosage Given to Date: 18 Gy
Reference Point Session Dosage Given: 3 Gy
Session Number: 6

## 2022-03-02 ENCOUNTER — Ambulatory Visit: Payer: Medicare HMO | Admitting: Internal Medicine

## 2022-03-02 ENCOUNTER — Other Ambulatory Visit: Payer: Self-pay

## 2022-03-02 ENCOUNTER — Inpatient Hospital Stay: Payer: Medicare HMO | Admitting: Physician Assistant

## 2022-03-02 ENCOUNTER — Ambulatory Visit
Admission: RE | Admit: 2022-03-02 | Discharge: 2022-03-02 | Disposition: A | Discharge status: Home / Self Care | Payer: Medicare HMO | Source: Ambulatory Visit | Attending: Radiation Oncology | Admitting: Radiation Oncology

## 2022-03-02 DIAGNOSIS — C3490 Malignant neoplasm of unspecified part of unspecified bronchus or lung: Secondary | ICD-10-CM | POA: Diagnosis not present

## 2022-03-02 DIAGNOSIS — C7931 Secondary malignant neoplasm of brain: Secondary | ICD-10-CM | POA: Diagnosis not present

## 2022-03-02 DIAGNOSIS — C3412 Malignant neoplasm of upper lobe, left bronchus or lung: Secondary | ICD-10-CM | POA: Diagnosis not present

## 2022-03-02 DIAGNOSIS — C787 Secondary malignant neoplasm of liver and intrahepatic bile duct: Secondary | ICD-10-CM | POA: Diagnosis not present

## 2022-03-02 LAB — RAD ONC ARIA SESSION SUMMARY
Course Elapsed Days: 8
Plan Fractions Treated to Date: 7
Plan Prescribed Dose Per Fraction: 3 Gy
Plan Total Fractions Prescribed: 10
Plan Total Prescribed Dose: 30 Gy
Reference Point Dosage Given to Date: 21 Gy
Reference Point Session Dosage Given: 3 Gy
Session Number: 7

## 2022-03-03 ENCOUNTER — Other Ambulatory Visit: Payer: Self-pay

## 2022-03-03 ENCOUNTER — Inpatient Hospital Stay: Payer: Medicare HMO | Admitting: Internal Medicine

## 2022-03-03 ENCOUNTER — Ambulatory Visit
Admission: RE | Admit: 2022-03-03 | Discharge: 2022-03-03 | Disposition: A | Discharge status: Home / Self Care | Payer: Medicare HMO | Source: Ambulatory Visit | Attending: Radiation Oncology | Admitting: Radiation Oncology

## 2022-03-03 DIAGNOSIS — C787 Secondary malignant neoplasm of liver and intrahepatic bile duct: Secondary | ICD-10-CM | POA: Diagnosis not present

## 2022-03-03 DIAGNOSIS — C3412 Malignant neoplasm of upper lobe, left bronchus or lung: Secondary | ICD-10-CM | POA: Diagnosis not present

## 2022-03-03 DIAGNOSIS — C7931 Secondary malignant neoplasm of brain: Secondary | ICD-10-CM | POA: Diagnosis not present

## 2022-03-03 DIAGNOSIS — C3490 Malignant neoplasm of unspecified part of unspecified bronchus or lung: Secondary | ICD-10-CM | POA: Diagnosis not present

## 2022-03-03 LAB — RAD ONC ARIA SESSION SUMMARY
Course Elapsed Days: 9
Plan Fractions Treated to Date: 8
Plan Prescribed Dose Per Fraction: 3 Gy
Plan Total Fractions Prescribed: 10
Plan Total Prescribed Dose: 30 Gy
Reference Point Dosage Given to Date: 24 Gy
Reference Point Session Dosage Given: 3 Gy
Session Number: 8

## 2022-03-04 ENCOUNTER — Ambulatory Visit
Admission: RE | Admit: 2022-03-04 | Discharge: 2022-03-04 | Disposition: A | Discharge status: Home / Self Care | Payer: Medicare HMO | Source: Ambulatory Visit | Attending: Radiation Oncology | Admitting: Radiation Oncology

## 2022-03-04 ENCOUNTER — Other Ambulatory Visit: Payer: Self-pay

## 2022-03-04 DIAGNOSIS — C3412 Malignant neoplasm of upper lobe, left bronchus or lung: Secondary | ICD-10-CM | POA: Diagnosis not present

## 2022-03-04 DIAGNOSIS — C7931 Secondary malignant neoplasm of brain: Secondary | ICD-10-CM | POA: Diagnosis not present

## 2022-03-04 DIAGNOSIS — C787 Secondary malignant neoplasm of liver and intrahepatic bile duct: Secondary | ICD-10-CM | POA: Diagnosis not present

## 2022-03-04 DIAGNOSIS — C3490 Malignant neoplasm of unspecified part of unspecified bronchus or lung: Secondary | ICD-10-CM | POA: Diagnosis not present

## 2022-03-04 LAB — RAD ONC ARIA SESSION SUMMARY
Course Elapsed Days: 10
Plan Fractions Treated to Date: 9
Plan Prescribed Dose Per Fraction: 3 Gy
Plan Total Fractions Prescribed: 10
Plan Total Prescribed Dose: 30 Gy
Reference Point Dosage Given to Date: 27 Gy
Reference Point Session Dosage Given: 3 Gy
Session Number: 9

## 2022-03-07 ENCOUNTER — Other Ambulatory Visit: Payer: Self-pay

## 2022-03-07 ENCOUNTER — Ambulatory Visit
Admission: RE | Admit: 2022-03-07 | Discharge: 2022-03-07 | Disposition: A | Discharge status: Home / Self Care | Payer: Medicare HMO | Source: Ambulatory Visit | Attending: Radiation Oncology | Admitting: Radiation Oncology

## 2022-03-07 ENCOUNTER — Encounter: Payer: Self-pay | Admitting: *Deleted

## 2022-03-07 ENCOUNTER — Encounter: Payer: Self-pay | Admitting: Urology

## 2022-03-07 DIAGNOSIS — C7931 Secondary malignant neoplasm of brain: Secondary | ICD-10-CM | POA: Diagnosis not present

## 2022-03-07 DIAGNOSIS — C787 Secondary malignant neoplasm of liver and intrahepatic bile duct: Secondary | ICD-10-CM | POA: Diagnosis not present

## 2022-03-07 DIAGNOSIS — C3490 Malignant neoplasm of unspecified part of unspecified bronchus or lung: Secondary | ICD-10-CM | POA: Diagnosis not present

## 2022-03-07 DIAGNOSIS — Z510 Encounter for antineoplastic radiation therapy: Secondary | ICD-10-CM | POA: Diagnosis not present

## 2022-03-07 DIAGNOSIS — Z87891 Personal history of nicotine dependence: Secondary | ICD-10-CM | POA: Diagnosis not present

## 2022-03-07 DIAGNOSIS — C3412 Malignant neoplasm of upper lobe, left bronchus or lung: Secondary | ICD-10-CM | POA: Diagnosis not present

## 2022-03-07 LAB — RAD ONC ARIA SESSION SUMMARY
Course Elapsed Days: 13
Plan Fractions Treated to Date: 10
Plan Prescribed Dose Per Fraction: 3 Gy
Plan Total Fractions Prescribed: 10
Plan Total Prescribed Dose: 30 Gy
Reference Point Dosage Given to Date: 30 Gy
Reference Point Session Dosage Given: 3 Gy
Session Number: 10

## 2022-03-11 ENCOUNTER — Other Ambulatory Visit: Payer: Self-pay | Admitting: Internal Medicine

## 2022-03-24 DIAGNOSIS — H5319 Other subjective visual disturbances: Secondary | ICD-10-CM | POA: Diagnosis not present

## 2022-03-24 DIAGNOSIS — H353132 Nonexudative age-related macular degeneration, bilateral, intermediate dry stage: Secondary | ICD-10-CM | POA: Diagnosis not present

## 2022-03-28 ENCOUNTER — Inpatient Hospital Stay: Payer: Medicare HMO

## 2022-03-29 ENCOUNTER — Ambulatory Visit (HOSPITAL_COMMUNITY)
Admission: RE | Admit: 2022-03-29 | Discharge: 2022-03-29 | Disposition: A | Discharge status: Home / Self Care | Payer: Medicare HMO | Source: Ambulatory Visit | Attending: Internal Medicine | Admitting: Internal Medicine

## 2022-03-29 ENCOUNTER — Other Ambulatory Visit: Payer: Self-pay

## 2022-03-29 ENCOUNTER — Inpatient Hospital Stay: Payer: Medicare HMO | Attending: Internal Medicine

## 2022-03-29 DIAGNOSIS — R918 Other nonspecific abnormal finding of lung field: Secondary | ICD-10-CM | POA: Diagnosis not present

## 2022-03-29 DIAGNOSIS — C3490 Malignant neoplasm of unspecified part of unspecified bronchus or lung: Secondary | ICD-10-CM | POA: Insufficient documentation

## 2022-03-29 DIAGNOSIS — C3412 Malignant neoplasm of upper lobe, left bronchus or lung: Secondary | ICD-10-CM | POA: Insufficient documentation

## 2022-03-29 DIAGNOSIS — C7931 Secondary malignant neoplasm of brain: Secondary | ICD-10-CM | POA: Insufficient documentation

## 2022-03-29 DIAGNOSIS — C787 Secondary malignant neoplasm of liver and intrahepatic bile duct: Secondary | ICD-10-CM | POA: Insufficient documentation

## 2022-03-29 DIAGNOSIS — Z79899 Other long term (current) drug therapy: Secondary | ICD-10-CM | POA: Insufficient documentation

## 2022-03-29 DIAGNOSIS — R160 Hepatomegaly, not elsewhere classified: Secondary | ICD-10-CM | POA: Diagnosis not present

## 2022-03-29 DIAGNOSIS — R42 Dizziness and giddiness: Secondary | ICD-10-CM | POA: Insufficient documentation

## 2022-03-29 LAB — CMP (CANCER CENTER ONLY)
ALT: 21 U/L (ref 0–44)
AST: 20 U/L (ref 15–41)
Albumin: 4 g/dL (ref 3.5–5.0)
Alkaline Phosphatase: 53 U/L (ref 38–126)
Anion gap: 9 (ref 5–15)
BUN: 18 mg/dL (ref 8–23)
CO2: 26 mmol/L (ref 22–32)
Calcium: 9.7 mg/dL (ref 8.9–10.3)
Chloride: 105 mmol/L (ref 98–111)
Creatinine: 1.35 mg/dL — ABNORMAL HIGH (ref 0.44–1.00)
GFR, Estimated: 40 mL/min — ABNORMAL LOW (ref >60)
Glucose, Bld: 108 mg/dL — ABNORMAL HIGH (ref 70–99)
Potassium: 3.7 mmol/L (ref 3.5–5.1)
Sodium: 140 mmol/L (ref 135–145)
Total Bilirubin: 0.8 mg/dL (ref 0.3–1.2)
Total Protein: 6.9 g/dL (ref 6.5–8.1)

## 2022-03-29 LAB — CBC WITH DIFFERENTIAL (CANCER CENTER ONLY)
Abs Immature Granulocytes: 0.1 10*3/uL — ABNORMAL HIGH (ref 0.00–0.07)
Basophils Absolute: 0 10*3/uL (ref 0.0–0.1)
Basophils Relative: 1 %
Eosinophils Absolute: 0.1 10*3/uL (ref 0.0–0.5)
Eosinophils Relative: 1 %
HCT: 39.5 % (ref 36.0–46.0)
Hemoglobin: 13 g/dL (ref 12.0–15.0)
Immature Granulocytes: 1 %
Lymphocytes Relative: 16 %
Lymphs Abs: 1.2 10*3/uL (ref 0.7–4.0)
MCH: 30.3 pg (ref 26.0–34.0)
MCHC: 32.9 g/dL (ref 30.0–36.0)
MCV: 92.1 fL (ref 80.0–100.0)
Monocytes Absolute: 0.6 10*3/uL (ref 0.1–1.0)
Monocytes Relative: 8 %
Neutro Abs: 5.5 10*3/uL (ref 1.7–7.7)
Neutrophils Relative %: 73 %
Platelet Count: 147 10*3/uL — ABNORMAL LOW (ref 150–400)
RBC: 4.29 MIL/uL (ref 3.87–5.11)
RDW: 13.7 % (ref 11.5–15.5)
WBC Count: 7.5 10*3/uL (ref 4.0–10.5)
nRBC: 0 % (ref 0.0–0.2)

## 2022-03-29 MED ORDER — IOHEXOL 300 MG/ML  SOLN
80.0000 mL | Freq: Once | INTRAMUSCULAR | Status: AC | PRN
  Administered 2022-03-29: 80 mL via INTRAVENOUS

## 2022-03-31 ENCOUNTER — Inpatient Hospital Stay (HOSPITAL_BASED_OUTPATIENT_CLINIC_OR_DEPARTMENT_OTHER): Payer: Medicare HMO | Admitting: Internal Medicine

## 2022-03-31 ENCOUNTER — Other Ambulatory Visit: Payer: Self-pay

## 2022-03-31 VITALS — BP 137/83 | HR 78 | Temp 98.0°F | Resp 15 | Wt 169.0 lb

## 2022-03-31 DIAGNOSIS — Z5111 Encounter for antineoplastic chemotherapy: Secondary | ICD-10-CM

## 2022-03-31 DIAGNOSIS — C3490 Malignant neoplasm of unspecified part of unspecified bronchus or lung: Secondary | ICD-10-CM

## 2022-03-31 DIAGNOSIS — C3412 Malignant neoplasm of upper lobe, left bronchus or lung: Secondary | ICD-10-CM

## 2022-03-31 DIAGNOSIS — Z79899 Other long term (current) drug therapy: Secondary | ICD-10-CM | POA: Diagnosis not present

## 2022-03-31 DIAGNOSIS — C7931 Secondary malignant neoplasm of brain: Secondary | ICD-10-CM | POA: Diagnosis not present

## 2022-03-31 DIAGNOSIS — R42 Dizziness and giddiness: Secondary | ICD-10-CM | POA: Diagnosis not present

## 2022-03-31 DIAGNOSIS — C787 Secondary malignant neoplasm of liver and intrahepatic bile duct: Secondary | ICD-10-CM

## 2022-03-31 NOTE — Progress Notes (Signed)
[place] [phone]   [phone]  OFFICE PROGRESS NOTE  [name], MD [place] [address]  DIAGNOSIS: Stage IV (T1c, N1, M1c) non-small cell lung cancer, adenocarcinoma presented with locally advanced disease in the left lung  with a left upper lobe lung nodule in addition to left hilar adenopathy and a solitary left frontal brain metastasis s/p resection of the brain tumor with SRS.  This was diagnosed in July 2022.   Biomarker Findings Tumor Mutational Burden - 43 Muts/Mb Microsatellite status - MS-Stable Genomic Findings For a complete list of the genes assayed, please refer to the Appendix. EGFR L861Q MTAP loss CDKN2A/B CDKN2A loss, CDKN2B loss CUL4A amplification - equivocal? IRS2 amplification SF3B1 K666N TERT promoter -124C>T TP53 N247I 7 Disease relevant genes with no reportable alterations: ALK, BRAF, ERBB2, KRAS, MET, RET, ROS1   PDL1 Expression 95%   PRIOR THERAPY:  1) Resection followed by [facility] of the solitary frontal lobe brain metastasis on July 28th, 2022  2) SRS to new brain metastasis under the care of Dr. Tammi Klippel. 3) SBRT to enlarging right liver lesion under the care of Dr. Tammi Klippel   CURRENT THERAPY: Targeted treatment with Tagrisso 80 mg p.o. daily. First dose 05/27/21.  This was reduced to 40 mg p.o. daily on July 14, 2021 status post 8 months of treatment  INTERVAL HISTORY: Marie Hensley 76 y.o. female returns to the clinic today for follow-up visit accompanied by her daughter.  The patient is feeling fine today with no concerning complaints except for mild fatigue and occasional dizzy spells.  She denied having any current chest pain, shortness of breath, cough or hemoptysis.  She has no nausea, vomiting, diarrhea or constipation.  She has no headache or visual changes.  She has some itching in the scalp.  She has no recent weight loss or night sweats.  She has been tolerating her  treatment with Tagrisso fairly well.  The patient had repeat CT scan of the chest, abdomen and pelvis performed recently and she is here for evaluation and discussion of her scan results.   MEDICAL HISTORY: Past Medical History:  Diagnosis Date   Arthritis    Brain tumor (Max)    Depression    Dyspnea    WITH EXERTION-SEEING DR [name]   HTN (hypertension)    Hyperlipemia    Seizures (HCC)     ALLERGIES:  has No Known Allergies.  MEDICATIONS:  Current Outpatient Medications  Medication Sig Dispense Refill   citalopram (CELEXA) 20 MG tablet TAKE ONE TABLET BY MOUTH AT BEDTIME FOR DEPRESSION 90 tablet 0   acetaminophen (TYLENOL) 500 MG tablet Take 1,000 mg by mouth every 6 (six) hours as needed for mild pain (or arthritis).     atorvastatin (LIPITOR) 40 MG tablet Take 1 tablet (40 mg total) by mouth at bedtime. 90 tablet 3   cetirizine (ZYRTEC) 10 MG tablet Take 10 mg by mouth daily as needed for allergies or rhinitis.     dexamethasone (DECADRON) 1 MG tablet TAKE ONE TABLET BY MOUTH ONE TIME DAILY 30 tablet 3   diphenoxylate-atropine (LOMOTIL) 2.5-0.025 MG tablet Take 1 tablet by mouth 4 (four) times daily as needed for diarrhea or loose stools. 30 tablet 0   fluticasone (FLONASE) 50 MCG/ACT nasal spray Place 1 spray into both nostrils daily as needed for allergies.     levETIRAcetam (KEPPRA) 500 MG tablet TAKE TWO TABLETS BY MOUTH TWICE A  DAY 120 tablet 3   loperamide (IMODIUM A-D) 2 MG tablet Take 2 mg by mouth 2 (two) times daily as needed for diarrhea or loose stools.     osimertinib mesylate (TAGRISSO) 40 MG tablet Take 1 tablet (40 mg total) by mouth daily. 30 tablet 5   oxyCODONE (OXY IR/ROXICODONE) 5 MG immediate release tablet Take 1 tablet (5 mg total) by mouth 3 (three) times daily as needed for severe pain. 20 tablet 0   prochlorperazine (COMPAZINE) 10 MG tablet Take 1 tablet (10 mg total) by mouth every 6 (six) hours as needed for nausea or vomiting. 30 tablet 0    propranolol ER (INDERAL LA) 60 MG 24 hr capsule Take 1 capsule (60 mg total) by mouth at bedtime. (Patient not taking: Reported on 11/11/2021) 90 capsule 3   No current facility-administered medications for this visit.    SURGICAL HISTORY:  Past Surgical History:  Procedure Laterality Date   APPENDECTOMY  2841   APPLICATION OF CRANIAL NAVIGATION N/A 04/08/2021   Procedure: APPLICATION OF CRANIAL NAVIGATION;  Surgeon: Judith Part, MD;  Location: [place];  Service: Neurosurgery;  Laterality: N/A;  RM 21   CRANIOTOMY Left 04/08/2021   Procedure: Left Craniotomy for tumor resection with brainlab;  Surgeon: Judith Part, MD;  Location: [place];  Service: Neurosurgery;  Laterality: Left;   EYE SURGERY Bilateral    CATARACTS   OOPHORECTOMY Right    VIDEO BRONCHOSCOPY WITH ENDOBRONCHIAL NAVIGATION Left 12/23/2020   Procedure: VIDEO BRONCHOSCOPY WITH ENDOBRONCHIAL NAVIGATION;  Surgeon: Tyler Pita, MD;  Location: [facility];  Service: Pulmonary;  Laterality: Left;   VIDEO BRONCHOSCOPY WITH ENDOBRONCHIAL NAVIGATION Left 02/01/2021   Procedure: ROBOTIC ASSISTED VIDEO BRONCHOSCOPY WITH ENDOBRONCHIAL NAVIGATION;  Surgeon: Tyler Pita, MD;  Location: [facility];  Service: Pulmonary;  Laterality: Left;   VIDEO BRONCHOSCOPY WITH ENDOBRONCHIAL ULTRASOUND Left 12/23/2020   Procedure: VIDEO BRONCHOSCOPY WITH ENDOBRONCHIAL ULTRASOUND;  Surgeon: Tyler Pita, MD;  Location: [facility];  Service: Pulmonary;  Laterality: Left;   VIDEO BRONCHOSCOPY WITH ENDOBRONCHIAL ULTRASOUND Left 02/01/2021   Procedure: ROBOTIC ASSITSTED VIDEO BRONCHOSCOPY WITH ENDOBRONCHIAL ULTRASOUND;  Surgeon: Tyler Pita, MD;  Location: [facility];  Service: Pulmonary;  Laterality: Left;    REVIEW OF SYSTEMS:  Constitutional: positive for fatigue Eyes: negative Ears, nose, mouth, throat, and face: negative Respiratory: negative Cardiovascular: negative Gastrointestinal:  negative Genitourinary:negative Integument/breast: positive for dryness and pruritus Hematologic/lymphatic: negative Musculoskeletal:negative Neurological: positive for coordination problems Behavioral/Psych: negative Endocrine: negative Allergic/Immunologic: negative   PHYSICAL EXAMINATION: General appearance: alert, cooperative, fatigued, and no distress Head: Normocephalic, without obvious abnormality, atraumatic Neck: no adenopathy, no JVD, supple, symmetrical, trachea midline, and thyroid not enlarged, symmetric, no tenderness/mass/nodules Lymph nodes: Cervical, supraclavicular, and axillary nodes normal. Resp: clear to auscultation bilaterally Back: symmetric, no curvature. ROM normal. No CVA tenderness. Cardio: regular rate and rhythm, S1, S2 normal, no murmur, click, rub or gallop GI: soft, non-tender; bowel sounds normal; no masses,  no organomegaly Extremities: extremities normal, atraumatic, no cyanosis or edema Neurologic: Alert and oriented X 3, normal strength and tone. Normal symmetric reflexes. Normal coordination and gait  ECOG PERFORMANCE STATUS: 1 - Symptomatic but completely ambulatory  Blood pressure 137/83, pulse 78, temperature 98 F (36.7 C), temperature source Oral, resp. rate 15, weight 169 lb (76.7 kg), SpO2 96 %.  LABORATORY DATA: Lab Results  Component Value Date   WBC 7.5 03/29/2022   HGB 13.0 03/29/2022   HCT 39.5 03/29/2022   MCV 92.1 03/29/2022  PLT 147 (L) 03/29/2022      Chemistry      Component Value Date/Time   NA 140 03/29/2022 1333   K 3.7 03/29/2022 1333   CL 105 03/29/2022 1333   CO2 26 03/29/2022 1333   BUN 18 03/29/2022 1333   CREATININE 1.35 (H) 03/29/2022 1333      Component Value Date/Time   CALCIUM 9.7 03/29/2022 1333   ALKPHOS 53 03/29/2022 1333   AST 20 03/29/2022 1333   ALT 21 03/29/2022 1333   BILITOT 0.8 03/29/2022 1333       RADIOGRAPHIC STUDIES: CT Chest W Contrast  Result Date: 03/30/2022 CLINICAL DATA:   Primary Cancer Type: Lung Imaging Indication: Assess response to therapy Interval therapy since last imaging? Yes Initial Cancer Diagnosis Date: 12/23/2020; Established by: Biopsy-proven Detailed Pathology: Stage IV non-small cell lung cancer, adenocarcinoma. Primary Tumor location: Left upper lobe. Brain metastasis. Liver mass. Surgeries: Craniotomy. Appendectomy. Right oophorectomy. Chemotherapy: Yes; Ongoing?  Yes; Tagrisso daily Immunotherapy? No Radiation therapy? Yes Date Range: 02/22/2022 - 03/07/2022; Target: Brain Date Range: 01/11/2022 - 01/17/2022; Target: Left lung Date Range: 11/10/2021 - 11/19/2021; Target: Liver Date Range: 10/20/2021; Target: Brain Date Range: 04/06/2021; Target: Brain * Tracking Code: BO * EXAM: CT CHEST, ABDOMEN AND PELVIS WITH CONTRAST TECHNIQUE: Multidetector CT imaging of the chest was performed during intravenous contrast administration. RADIATION DOSE REDUCTION: This exam was performed according to the departmental dose-optimization program which includes automated exposure control, adjustment of the mA and/or kV according to patient size and/or use of iterative reconstruction technique. CONTRAST:  42m OMNIPAQUE IOHEXOL 300 MG/ML SOLN, additional oral enteric contrast COMPARISON:  Most recent CT chest, abdomen and pelvis 12/10/2021. 12/22/2020 PET-CT. FINDINGS: CT CHEST FINDINGS Cardiovascular: Aortic atherosclerosis. Normal heart size. No pericardial effusion. Mediastinum/Nodes: Newly enlarged, low right periaortic lymph node, measuring 1.0 x 0.9 cm (series 2, image 44). No other enlarged mediastinal, hilar, or axillary lymph nodes. Thyroid gland, trachea, and esophagus demonstrate no significant findings. Lungs/Pleura: Slightly diminished size of a spiculated mass of the posterior left upper lobe, measuring 2.0 x 1.6 cm, previously 2.1 x 2.0 cm (series 4, image 41). There is increased, bandlike scarring and ground-glass adjacent to this lesion. Mild, bandlike scarring of  the bilateral lung bases. No pleural effusion or pneumothorax. Musculoskeletal: No chest wall mass or suspicious osseous lesions identified. CT ABDOMEN PELVIS FINDINGS Hepatobiliary: Previously seen subcapsular hypodense, edematous lesion of the anterior right lobe of the liver is no longer directly visualized, with adjacent biopsy/fiducial markers (series 2, image 55). No gallstones, gallbladder wall thickening, or biliary dilatation. Pancreas: Unremarkable. No pancreatic ductal dilatation or surrounding inflammatory changes. Spleen: Normal in size without significant abnormality. Adrenals/Urinary Tract: Adrenal glands are unremarkable. Kidneys are normal, without renal calculi, solid lesion, or hydronephrosis. Bladder is unremarkable. Stomach/Bowel: Stomach is within normal limits. Appendix is surgically absent. No evidence of bowel wall thickening, distention, or inflammatory changes. Vascular/Lymphatic: Aortic atherosclerosis. No enlarged abdominal or pelvic lymph nodes. Reproductive: No mass or other abnormality. Other: No abdominal wall hernia or abnormality. No ascites. Musculoskeletal: No acute osseous findings. IMPRESSION: 1. Slightly diminished size of a spiculated mass of the posterior left upper lobe with bandlike scarring and ground-glass adjacent to this lesion. Findings are consistent with treatment response and adjacent radiation pneumonitis/fibrosis. 2. Previously seen subcapsular lesion of the anterior right lobe of the liver is no longer directly visualized, with adjacent biopsy/fiducial markers. Findings are consistent with treatment response. 3. Newly enlarged, low right periaortic lymph node, concerning for new nodal  metastatic disease although nonspecific. Attention on follow-up. Aortic Atherosclerosis (ICD10-I70.0). Electronically Signed   By: Delanna Ahmadi M.D.   On: 03/30/2022 12:21   CT Abdomen Pelvis W Contrast  Result Date: 03/30/2022 CLINICAL DATA:  Primary Cancer Type: Lung Imaging  Indication: Assess response to therapy Interval therapy since last imaging? Yes Initial Cancer Diagnosis Date: 12/23/2020; Established by: Biopsy-proven Detailed Pathology: Stage IV non-small cell lung cancer, adenocarcinoma. Primary Tumor location: Left upper lobe. Brain metastasis. Liver mass. Surgeries: Craniotomy. Appendectomy. Right oophorectomy. Chemotherapy: Yes; Ongoing?  Yes; Tagrisso daily Immunotherapy? No Radiation therapy? Yes Date Range: 02/22/2022 - 03/07/2022; Target: Brain Date Range: 01/11/2022 - 01/17/2022; Target: Left lung Date Range: 11/10/2021 - 11/19/2021; Target: Liver Date Range: 10/20/2021; Target: Brain Date Range: 04/06/2021; Target: Brain * Tracking Code: BO * EXAM: CT CHEST, ABDOMEN AND PELVIS WITH CONTRAST TECHNIQUE: Multidetector CT imaging of the chest was performed during intravenous contrast administration. RADIATION DOSE REDUCTION: This exam was performed according to the departmental dose-optimization program which includes automated exposure control, adjustment of the mA and/or kV according to patient size and/or use of iterative reconstruction technique. CONTRAST:  31m OMNIPAQUE IOHEXOL 300 MG/ML SOLN, additional oral enteric contrast COMPARISON:  Most recent CT chest, abdomen and pelvis 12/10/2021. 12/22/2020 PET-CT. FINDINGS: CT CHEST FINDINGS Cardiovascular: Aortic atherosclerosis. Normal heart size. No pericardial effusion. Mediastinum/Nodes: Newly enlarged, low right periaortic lymph node, measuring 1.0 x 0.9 cm (series 2, image 44). No other enlarged mediastinal, hilar, or axillary lymph nodes. Thyroid gland, trachea, and esophagus demonstrate no significant findings. Lungs/Pleura: Slightly diminished size of a spiculated mass of the posterior left upper lobe, measuring 2.0 x 1.6 cm, previously 2.1 x 2.0 cm (series 4, image 41). There is increased, bandlike scarring and ground-glass adjacent to this lesion. Mild, bandlike scarring of the bilateral lung bases. No pleural  effusion or pneumothorax. Musculoskeletal: No chest wall mass or suspicious osseous lesions identified. CT ABDOMEN PELVIS FINDINGS Hepatobiliary: Previously seen subcapsular hypodense, edematous lesion of the anterior right lobe of the liver is no longer directly visualized, with adjacent biopsy/fiducial markers (series 2, image 55). No gallstones, gallbladder wall thickening, or biliary dilatation. Pancreas: Unremarkable. No pancreatic ductal dilatation or surrounding inflammatory changes. Spleen: Normal in size without significant abnormality. Adrenals/Urinary Tract: Adrenal glands are unremarkable. Kidneys are normal, without renal calculi, solid lesion, or hydronephrosis. Bladder is unremarkable. Stomach/Bowel: Stomach is within normal limits. Appendix is surgically absent. No evidence of bowel wall thickening, distention, or inflammatory changes. Vascular/Lymphatic: Aortic atherosclerosis. No enlarged abdominal or pelvic lymph nodes. Reproductive: No mass or other abnormality. Other: No abdominal wall hernia or abnormality. No ascites. Musculoskeletal: No acute osseous findings. IMPRESSION: 1. Slightly diminished size of a spiculated mass of the posterior left upper lobe with bandlike scarring and ground-glass adjacent to this lesion. Findings are consistent with treatment response and adjacent radiation pneumonitis/fibrosis. 2. Previously seen subcapsular lesion of the anterior right lobe of the liver is no longer directly visualized, with adjacent biopsy/fiducial markers. Findings are consistent with treatment response. 3. Newly enlarged, low right periaortic lymph node, concerning for new nodal metastatic disease although nonspecific. Attention on follow-up. Aortic Atherosclerosis (ICD10-I70.0). Electronically Signed   By: ADelanna AhmadiM.D.   On: 03/30/2022 12:21    ASSESSMENT AND PLAN: This is a very pleasant 76years old white female diagnosed with a stage IV (T1c, N1, M1 C) non-small cell lung cancer,  adenocarcinoma with positive EGFR mutation (([id number] presented with locally advanced disease in the left lung including left upper lobe lung  nodule in addition to left hilar adenopathy and solitary left frontal brain metastasis s/p resection followed by SRS diagnosed in July 2022. The patient is currently treatment with Tagrisso 80 mg p.o. daily.  Her dose of Tagrisso was reduced to 40 mg p.o. daily on July 14, 2021 secondary to severe diarrhea that was not responding to Imodium or Lomotil. She is status post 8 months of treatment and has been tolerating her Tagrisso at the reduced dose much better. The patient has been tolerating her treatment with Tagrisso fairly well with no concerning adverse effects. For the enlarging liver lesion, she underwent SBRT under the care of Dr. Tammi Klippel. She had repeat CT scan of the chest, abdomen and pelvis performed recently.  I personally and independently reviewed the scan images and discussed the result with the patient and her daughter. Her scan showed no concerning findings for disease progression except for newly enlargement aortic lymph node that need close monitoring on the upcoming imaging studies. I recommended for the patient to continue her current treatment with Tagrisso with the same dose. I will see her back for follow-up visit in 1 months for evaluation and repeat blood work. The patient was advised to call immediately if she has any other concerning symptoms in the interval.  The patient voices understanding of current disease status and treatment options and is in agreement with the current care plan.  All questions were answered. The patient knows to call the clinic with any problems, questions or concerns. We can certainly see the patient much sooner if necessary.  The total time spent in the appointment was 35 minutes.  Disclaimer: This note was dictated with voice recognition software. Similar sounding words can inadvertently be transcribed  and may not be corrected upon review.

## 2022-04-04 ENCOUNTER — Other Ambulatory Visit: Payer: Self-pay

## 2022-04-06 ENCOUNTER — Other Ambulatory Visit: Payer: Self-pay | Admitting: Internal Medicine

## 2022-04-19 ENCOUNTER — Other Ambulatory Visit: Payer: Self-pay

## 2022-04-19 NOTE — Patient Outreach (Signed)
  Care Coordination   Initial Visit Note   04/19/2022 Name: Marie Hensley MRN: [id number] DOB: 1946/06/18  Marie Hensley is a 76 y.o. year old female who sees Einar Pheasant, Jobe Marker, MD for primary care. I spoke with  Marie Hensley by phone today  What matters to the patients health and wellness today?  Patient states she will be changing primary care provider. She states her daughter is handling this for her.  She states she does not need any additional follow up at this time.  RNCM discussed annual wellness visit with patient and advised to schedule with her new provider.  Patient verbalized understanding.    Goals Addressed             This Visit's Progress    COMPLETED: Care coordination activities - no follow up required                 SDOH assessments and interventions completed:  No     Care Coordination Interventions Activated:  No  Care Coordination Interventions:  No, not indicated   Follow up plan: No further intervention required.   Encounter Outcome:  Pt. Visit Completed   Quinn Plowman RN,BSN,CCM [place] Coordinator [phone]

## 2022-04-22 ENCOUNTER — Ambulatory Visit (INDEPENDENT_AMBULATORY_CARE_PROVIDER_SITE_OTHER): Payer: Medicare HMO | Admitting: Family

## 2022-04-22 ENCOUNTER — Encounter: Payer: Self-pay | Admitting: Family

## 2022-04-22 VITALS — BP 130/72 | HR 87 | Temp 98.6°F | Resp 16 | Ht 63.5 in | Wt 167.5 lb

## 2022-04-22 DIAGNOSIS — L237 Allergic contact dermatitis due to plants, except food: Secondary | ICD-10-CM

## 2022-04-22 MED ORDER — PREDNISONE 20 MG PO TABS: ORAL_TABLET | ORAL | 0 refills | Status: DC

## 2022-04-22 NOTE — Assessment & Plan Note (Signed)
rx prednisone 20 mg taper  Also made sure with pt oncologist that ok to give with decadron ok as this is low dose decadron.  Monitor daily for s/s superinfection or secondary infection.

## 2022-04-22 NOTE — Progress Notes (Signed)
Established Patient Office Visit  Subjective:  Patient ID: Marie Hensley, female    DOB: 26-Oct-1945  Age: 76 y.o. MRN: [id number]  CC:  Chief Complaint  Patient presents with   Rash    Face and neck X 3 days. Itches    HPI [name] is here today with concerns.   Three days ago itchy rash on the face, nose, cheek, and slight on the left upper arm. Also on lower neck.   Ran out of facial cream, and did use cerave on her face. Otherwise nothing new like detergent moisturizers and or anything else.   Past Medical History:  Diagnosis Date   Arthritis    Brain tumor ([place])    Depression    Dyspnea    WITH EXERTION-SEEING DR [name]   HTN (hypertension)    Hyperlipemia    Seizures ([facility])     Past Surgical History:  Procedure Laterality Date   APPENDECTOMY  2536   APPLICATION OF CRANIAL NAVIGATION N/A 04/08/2021   Procedure: APPLICATION OF CRANIAL NAVIGATION;  Surgeon: Judith Part, MD;  Location: [facility];  Service: Neurosurgery;  Laterality: N/A;  RM 21   CRANIOTOMY Left 04/08/2021   Procedure: Left Craniotomy for tumor resection with brainlab;  Surgeon: Judith Part, MD;  Location: [facility];  Service: Neurosurgery;  Laterality: Left;   EYE SURGERY Bilateral    CATARACTS   OOPHORECTOMY Right    VIDEO BRONCHOSCOPY WITH ENDOBRONCHIAL NAVIGATION Left 12/23/2020   Procedure: VIDEO BRONCHOSCOPY WITH ENDOBRONCHIAL NAVIGATION;  Surgeon: Tyler Pita, MD;  Location: [facility];  Service: Pulmonary;  Laterality: Left;   VIDEO BRONCHOSCOPY WITH ENDOBRONCHIAL NAVIGATION Left 02/01/2021   Procedure: ROBOTIC ASSISTED VIDEO BRONCHOSCOPY WITH ENDOBRONCHIAL NAVIGATION;  Surgeon: Tyler Pita, MD;  Location: [facility];  Service: Pulmonary;  Laterality: Left;   VIDEO BRONCHOSCOPY WITH ENDOBRONCHIAL ULTRASOUND Left 12/23/2020   Procedure: VIDEO BRONCHOSCOPY WITH ENDOBRONCHIAL ULTRASOUND;  Surgeon: Tyler Pita, MD;  Location: [facility];  Service: Pulmonary;   Laterality: Left;   VIDEO BRONCHOSCOPY WITH ENDOBRONCHIAL ULTRASOUND Left 02/01/2021   Procedure: ROBOTIC ASSITSTED VIDEO BRONCHOSCOPY WITH ENDOBRONCHIAL ULTRASOUND;  Surgeon: Tyler Pita, MD;  Location: [facility];  Service: Pulmonary;  Laterality: Left;    Family History  Problem Relation Age of Onset   Addison's disease Daughter    Hypertension Mother        died at 52   Alcohol abuse Father     Social History   Socioeconomic History   Marital status: Single    Spouse name: Not on file   Number of children: 3   Years of education: high school    Highest education level: Not on file  Occupational History   Occupation: retired  Tobacco Use   Smoking status: Former    Packs/day: 0.50    Years: 20.00    Total pack years: 10.00    Types: Cigarettes    Quit date: 10/27/1989    Years since quitting: 32.5   Smokeless tobacco: Never   Tobacco comments:    Smoked off and on in 20 year period  Vaping Use   Vaping Use: Never used  Substance and Sexual Activity   Alcohol use: Not Currently   Drug use: Never   Sexual activity: Not Currently    Birth control/protection: Post-menopausal  Other Topics Concern   Not on file  Social History Narrative   She has discussed advanced directives with her.  She would want CPR and intubation  if chance of recovery.   11/19/20   From: [place], moved for work 1998   Living: alone   Work: retired Energy manager of [employer]: 3 children - [name], [name], [name] - 5 grandchildren, 1 great-grandchild      Enjoys: shop, casino, cruise travel      Exercise: not as much due to the fatigue   Diet: tries to eat healthy, but eats what she wants      Safety   Seat belts: Yes    Guns: No   Safe in relationships: Yes    Social Determinants of Radio broadcast assistant Strain: Low Risk  (10/28/2021)   Overall Financial Resource Strain (CARDIA)    Difficulty of Paying Living Expenses: Not hard at all  Food Insecurity: No Food  Insecurity (10/28/2021)   Hunger Vital Sign    Worried About Running Out of Food in the Last Year: Never true    [place] in the Last Year: Never true  Transportation Needs: No Transportation Needs (10/28/2021)   PRAPARE - Hydrologist (Medical): No    Lack of Transportation (Non-Medical): No  Physical Activity: Insufficiently Active (10/28/2021)   Exercise Vital Sign    Days of Exercise per Week: 3 days    Minutes of Exercise per Session: 20 min  Stress: No Stress Concern Present (10/28/2021)   [place]    Feeling of Stress : Not at all  Social Connections: Socially Isolated (10/28/2021)   Social Connection and Isolation Panel [NHANES]    Frequency of Communication with Friends and Family: More than three times a week    Frequency of Social Gatherings with Friends and Family: Three times a week    Attends Religious Services: Never    Active Member of Clubs or Organizations: No    Attends Archivist Meetings: Never    Marital Status: Never married  Intimate Partner Violence: Not At Risk (10/28/2021)   Humiliation, Afraid, Rape, and Kick questionnaire    Fear of Current or Ex-Partner: No    Emotionally Abused: No    Physically Abused: No    Sexually Abused: No    Outpatient Medications Prior to Visit  Medication Sig Dispense Refill   acetaminophen (TYLENOL) 500 MG tablet Take 1,000 mg by mouth every 6 (six) hours as needed for mild pain (or arthritis).     atorvastatin (LIPITOR) 40 MG tablet Take 1 tablet (40 mg total) by mouth at bedtime. 90 tablet 3   cetirizine (ZYRTEC) 10 MG tablet Take 10 mg by mouth daily as needed for allergies or rhinitis.     citalopram (CELEXA) 20 MG tablet TAKE ONE TABLET BY MOUTH AT BEDTIME FOR DEPRESSION 90 tablet 0   dexamethasone (DECADRON) 1 MG tablet TAKE ONE TABLET BY MOUTH ONE TIME DAILY 30 tablet 3   diphenoxylate-atropine (LOMOTIL)  2.5-0.025 MG tablet Take 1 tablet by mouth 4 (four) times daily as needed for diarrhea or loose stools. 30 tablet 0   fluticasone (FLONASE) 50 MCG/ACT nasal spray Place 1 spray into both nostrils daily as needed for allergies.     levETIRAcetam (KEPPRA) 500 MG tablet TAKE TWO TABLETS BY MOUTH TWICE A DAY 120 tablet 3   loperamide (IMODIUM A-D) 2 MG tablet Take 2 mg by mouth 2 (two) times daily as needed for diarrhea or loose stools.     osimertinib mesylate (  TAGRISSO) 40 MG tablet Take 1 tablet (40 mg total) by mouth daily. 30 tablet 5   oxyCODONE (OXY IR/ROXICODONE) 5 MG immediate release tablet Take 1 tablet (5 mg total) by mouth 3 (three) times daily as needed for severe pain. 20 tablet 0   prochlorperazine (COMPAZINE) 10 MG tablet Take 1 tablet (10 mg total) by mouth every 6 (six) hours as needed for nausea or vomiting. 30 tablet 0   propranolol ER (INDERAL LA) 60 MG 24 hr capsule Take 1 capsule (60 mg total) by mouth at bedtime. 90 capsule 3   No facility-administered medications prior to visit.    No Known Allergies      Objective:    Physical Exam Constitutional:      General: She is not in acute distress.    Appearance: Normal appearance. She is normal weight. She is not ill-appearing, toxic-appearing or diaphoretic.  Pulmonary:     Effort: Pulmonary effort is normal.  Skin:    Comments: Red raised erythematic rash on face on bridge of nose, right side cheek, right upper forehead and left upper arm.   Neurological:     General: No focal deficit present.     Mental Status: She is alert and oriented to person, place, and time. Mental status is at baseline.  Psychiatric:        Mood and Affect: Mood normal.        Behavior: Behavior normal.        Thought Content: Thought content normal.        Judgment: Judgment normal.     BP 130/72   Pulse 87   Temp 98.6 F (37 C)   Resp 16   Ht 5' 3.5" (1.613 m)   Wt 167 lb 8 oz (76 kg)   SpO2 96%   BMI 29.21 kg/m  Wt Readings  from Last 3 Encounters:  04/22/22 167 lb 8 oz (76 kg)  03/31/22 169 lb (76.7 kg)  02/14/22 171 lb 2 oz (77.6 kg)     Health Maintenance Due  Topic Date Due   TETANUS/TDAP  Never done   Zoster Vaccines- Shingrix (1 of 2) Never done   DEXA SCAN  Never done   COVID-19 Vaccine (4 - Pfizer risk series) 11/25/2020   INFLUENZA VACCINE  04/12/2022    There are no preventive care reminders to display for this patient.  Lab Results  Component Value Date   TSH 3.47 04/20/2020   Lab Results  Component Value Date   WBC 7.5 03/29/2022   HGB 13.0 03/29/2022   HCT 39.5 03/29/2022   MCV 92.1 03/29/2022   PLT 147 (L) 03/29/2022   Lab Results  Component Value Date   NA 140 03/29/2022   K 3.7 03/29/2022   CO2 26 03/29/2022   GLUCOSE 108 (H) 03/29/2022   BUN 18 03/29/2022   CREATININE 1.35 (H) 03/29/2022   BILITOT 0.8 03/29/2022   ALKPHOS 53 03/29/2022   AST 20 03/29/2022   ALT 21 03/29/2022   PROT 6.9 03/29/2022   ALBUMIN 4.0 03/29/2022   CALCIUM 9.7 03/29/2022   ANIONGAP 9 03/29/2022   GFR 37.59 (L) 11/19/2020   Lab Results  Component Value Date   HGBA1C 6.4 (H) 04/08/2021      Assessment & Plan:   Problem List Items Addressed This Visit       Musculoskeletal and Integument   Poison ivy dermatitis - Primary    rx prednisone 20 mg taper  Also made sure with  pt oncologist that ok to give with decadron ok as this is low dose decadron.  Monitor daily for s/s superinfection or secondary infection.        Relevant Medications   predniSONE (DELTASONE) 20 MG tablet    Meds ordered this encounter  Medications   predniSONE (DELTASONE) 20 MG tablet    Sig: Take two tablet po qd for four days then one tablet po qd for four days    Dispense:  12 tablet    Refill:  0    Order Specific Question:   Supervising Provider    Answer:   Diona Browner, AMY E [[id number]]    Follow-up: Return if symptoms worsen or fail to improve with pcp.    Eugenia Pancoast, FNP

## 2022-04-22 NOTE — Patient Instructions (Signed)
Please monitor site for worsening signs/symptoms of infection to include: increasing redness, increasing tenderness, increase in size, and or pustulant drainage from site. If this is to occur please let me know immediately.   Due to recent changes in healthcare laws, you may see results of your imaging and/or laboratory studies on MyChart before I have had a chance to review them.  I understand that in some cases there may be results that are confusing or concerning to you. Please understand that not all results are received at the same time and often I may need to interpret multiple results in order to provide you with the best plan of care or course of treatment. Therefore, I ask that you please give me 2 business days to thoroughly review all your results before contacting my office for clarification. Should we see a critical lab result, you will be contacted sooner.   It was a pleasure seeing you today! Please do not hesitate to reach out with any questions and or concerns.  Regards,   Eugenia Pancoast FNP-C

## 2022-04-26 ENCOUNTER — Encounter: Payer: Self-pay | Admitting: Urology

## 2022-04-26 NOTE — Progress Notes (Signed)
Telephone appointment. I verified patient's identity and began nursing interview. No TX related issues reported at this time.  Meaningful use complete.  Reminded patient of their 10:30am-04/27/22 telephone appointment w/ Ashlyn Bruning PA-C. I left my extension [phone] in case patient needs anything. Patient verbalized understanding.  Patient contact [phone]

## 2022-04-27 ENCOUNTER — Ambulatory Visit
Admission: RE | Admit: 2022-04-27 | Discharge: 2022-04-27 | Disposition: A | Discharge status: Home / Self Care | Payer: Medicare HMO | Source: Ambulatory Visit | Attending: Urology | Admitting: Urology

## 2022-04-27 ENCOUNTER — Other Ambulatory Visit: Payer: Self-pay

## 2022-04-27 DIAGNOSIS — C7931 Secondary malignant neoplasm of brain: Secondary | ICD-10-CM

## 2022-04-27 NOTE — Progress Notes (Signed)
  Radiation Oncology         [phone]) [phone] ________________________________  Name: Marie Hensley MRN: [id number]  Date: 03/07/2022  DOB: 28-Jan-1946  End of Treatment Note  Diagnosis: 76 yo woman with Stage IV (T1c, N1, M1c) non-small cell lung cancer, adenocarcinoma of the left lung with more than 13 new brain metastases  Indication for treatment:  Palliation       Radiation treatment dates:   02/22/22 - 03/07/22  Site/dose:   The whole brain was treated to 30 Gy in 10 fractions of 3 Gy  Beams/energy:   Right and Left radiation fields were treated using 6 MV X-rays with custom MLC collimation to shield the eyes and face.  The patient was immobilized with a thermoplastic mask and isocenter was verified with weekly port films.  Narrative: The patient tolerated radiation treatment relatively well.   She did report some unsteadiness, headaches, fatigue and aphasia despite taking Decadron 1 mg daily as prescribed.  She also noted some scalp irritation.  Plan: The patient has completed radiation treatment. The patient will return to radiation oncology clinic for routine followup in one month. I advised them to call or return sooner if they have any questions or concerns related to their recovery or treatment. ________________________________  Sheral Apley. Tammi Klippel, M.D.

## 2022-04-27 NOTE — Progress Notes (Signed)
Radiation Oncology         [phone]) [phone] ________________________________  Name: Marie Hensley MRN: [id number]  Date: 04/27/2022  DOB: May 08, 1946  Post Treatment Note  CC: [name], MD  Curt Bears, MD  Diagnosis:   76 yo woman with Stage IV (T1c, N1, M1c) non-small cell lung cancer, adenocarcinoma of the left lung with more than 13 new brain metastases  Interval Since Last Radiation:  7 weeks  02/22/22 - 03/07/22: The whole brain was treated to 30 Gy in 10 fractions of 3 Gy  01/11/2022 - 01/17/22//SBRT: The left upper lobe lung received 54 Gy in 3 fractions.  11/10/21 - 11/19/21//SBRT: The liver target was treated to 50 Gy in 5 fractions of 10 Gy  10/20/21//SRS: These 6 targets were treated with single isocenter using 7 Rapid Arc VMAT Beams to a prescription dose of 20 Gy.  04/06/2021// Pre-Op SRS: PTV1: The solitary brain metastasis in the left frontal lobe was treated to 20 Gray in a single fraction. Site Technique Total Dose (Gy) Dose per Fx (Gy) Completed Fx Beam Energies  Brain: Brain_SRS PTV_1 Lt Frontal 22mm         Narrative:  I spoke with the patient to conduct her routine scheduled 1 month follow up visit via telephone to spare the patient unnecessary potential exposure in the healthcare setting during the current COVID-19 pandemic.  The patient was notified in advance and gave permission to proceed with this visit format.  She tolerated radiation treatment relatively well.  She did report some unsteadiness, headaches, fatigue and aphasia despite taking Decadron 1 mg daily as prescribed.  She also noted some scalp irritation.                              On review of systems, the patient states that she is doing well in general.  She does continue with expressive aphasia which is frustrating to her as well as intermittent unsteadiness/dizziness despite taking Decadron 1 mg daily as prescribed.  She does not feel like the symptoms have gotten any worse but they have  just not gotten any better.  She denies any headaches but has noticed a change in her peripheral vision as well as visual acuity since completing whole brain radiation.  The scalp irritation has resolved and she is starting to get some regrowth of her hair.  She denies any nausea, vomiting, diarrhea or constipation.  She has not had any tremors or seizure activity.  Overall, she is pleased with her progress to date.  She is currently completing a course of prednisone for treatment of poison ivy that she got while doing yard work recently.  ALLERGIES:  has No Known Allergies.  Meds: Current Outpatient Medications  Medication Sig Dispense Refill   acetaminophen (TYLENOL) 500 MG tablet Take 1,000 mg by mouth every 6 (six) hours as needed for mild pain (or arthritis).     atorvastatin (LIPITOR) 40 MG tablet Take 1 tablet (40 mg total) by mouth at bedtime. 90 tablet 3   cetirizine (ZYRTEC) 10 MG tablet Take 10 mg by mouth daily as needed for allergies or rhinitis.     citalopram (CELEXA) 20 MG tablet TAKE ONE TABLET BY MOUTH AT BEDTIME FOR DEPRESSION 90 tablet 0   dexamethasone (DECADRON) 1 MG tablet TAKE ONE TABLET BY MOUTH ONE TIME DAILY 30 tablet 3   diphenoxylate-atropine (LOMOTIL) 2.5-0.025 MG tablet Take 1 tablet by mouth 4 (four)  times daily as needed for diarrhea or loose stools. 30 tablet 0   fluticasone (FLONASE) 50 MCG/ACT nasal spray Place 1 spray into both nostrils daily as needed for allergies.     levETIRAcetam (KEPPRA) 500 MG tablet TAKE TWO TABLETS BY MOUTH TWICE A DAY 120 tablet 3   loperamide (IMODIUM A-D) 2 MG tablet Take 2 mg by mouth 2 (two) times daily as needed for diarrhea or loose stools.     osimertinib mesylate (TAGRISSO) 40 MG tablet Take 1 tablet (40 mg total) by mouth daily. 30 tablet 5   oxyCODONE (OXY IR/ROXICODONE) 5 MG immediate release tablet Take 1 tablet (5 mg total) by mouth 3 (three) times daily as needed for severe pain. 20 tablet 0   predniSONE (DELTASONE) 20 MG  tablet Take two tablet po qd for four days then one tablet po qd for four days 12 tablet 0   prochlorperazine (COMPAZINE) 10 MG tablet Take 1 tablet (10 mg total) by mouth every 6 (six) hours as needed for nausea or vomiting. 30 tablet 0   propranolol ER (INDERAL LA) 60 MG 24 hr capsule Take 1 capsule (60 mg total) by mouth at bedtime. 90 capsule 3   No current facility-administered medications for this encounter.    Physical Findings:  vitals were not taken for this visit.  Pain Assessment Pain Score: 0-No pain/10 Unable to assess due to telephone follow-up visit format.  Lab Findings: Lab Results  Component Value Date   WBC 7.5 03/29/2022   HGB 13.0 03/29/2022   HCT 39.5 03/29/2022   MCV 92.1 03/29/2022   PLT 147 (L) 03/29/2022     Radiographic Findings: CT Chest W Contrast  Result Date: 03/30/2022 CLINICAL DATA:  Primary Cancer Type: Lung Imaging Indication: Assess response to therapy Interval therapy since last imaging? Yes Initial Cancer Diagnosis Date: 12/23/2020; Established by: Biopsy-proven Detailed Pathology: Stage IV non-small cell lung cancer, adenocarcinoma. Primary Tumor location: Left upper lobe. Brain metastasis. Liver mass. Surgeries: Craniotomy. Appendectomy. Right oophorectomy. Chemotherapy: Yes; Ongoing?  Yes; Tagrisso daily Immunotherapy? No Radiation therapy? Yes Date Range: 02/22/2022 - 03/07/2022; Target: Brain Date Range: 01/11/2022 - 01/17/2022; Target: Left lung Date Range: 11/10/2021 - 11/19/2021; Target: Liver Date Range: 10/20/2021; Target: Brain Date Range: 04/06/2021; Target: Brain * Tracking Code: BO * EXAM: CT CHEST, ABDOMEN AND PELVIS WITH CONTRAST TECHNIQUE: Multidetector CT imaging of the chest was performed during intravenous contrast administration. RADIATION DOSE REDUCTION: This exam was performed according to the departmental dose-optimization program which includes automated exposure control, adjustment of the mA and/or kV according to patient size  and/or use of iterative reconstruction technique. CONTRAST:  70mL OMNIPAQUE IOHEXOL 300 MG/ML SOLN, additional oral enteric contrast COMPARISON:  Most recent CT chest, abdomen and pelvis 12/10/2021. 12/22/2020 PET-CT. FINDINGS: CT CHEST FINDINGS Cardiovascular: Aortic atherosclerosis. Normal heart size. No pericardial effusion. Mediastinum/Nodes: Newly enlarged, low right periaortic lymph node, measuring 1.0 x 0.9 cm (series 2, image 44). No other enlarged mediastinal, hilar, or axillary lymph nodes. Thyroid gland, trachea, and esophagus demonstrate no significant findings. Lungs/Pleura: Slightly diminished size of a spiculated mass of the posterior left upper lobe, measuring 2.0 x 1.6 cm, previously 2.1 x 2.0 cm (series 4, image 41). There is increased, bandlike scarring and ground-glass adjacent to this lesion. Mild, bandlike scarring of the bilateral lung bases. No pleural effusion or pneumothorax. Musculoskeletal: No chest wall mass or suspicious osseous lesions identified. CT ABDOMEN PELVIS FINDINGS Hepatobiliary: Previously seen subcapsular hypodense, edematous lesion of the anterior right lobe of  the liver is no longer directly visualized, with adjacent biopsy/fiducial markers (series 2, image 55). No gallstones, gallbladder wall thickening, or biliary dilatation. Pancreas: Unremarkable. No pancreatic ductal dilatation or surrounding inflammatory changes. Spleen: Normal in size without significant abnormality. Adrenals/Urinary Tract: Adrenal glands are unremarkable. Kidneys are normal, without renal calculi, solid lesion, or hydronephrosis. Bladder is unremarkable. Stomach/Bowel: Stomach is within normal limits. Appendix is surgically absent. No evidence of bowel wall thickening, distention, or inflammatory changes. Vascular/Lymphatic: Aortic atherosclerosis. No enlarged abdominal or pelvic lymph nodes. Reproductive: No mass or other abnormality. Other: No abdominal wall hernia or abnormality. No ascites.  Musculoskeletal: No acute osseous findings. IMPRESSION: 1. Slightly diminished size of a spiculated mass of the posterior left upper lobe with bandlike scarring and ground-glass adjacent to this lesion. Findings are consistent with treatment response and adjacent radiation pneumonitis/fibrosis. 2. Previously seen subcapsular lesion of the anterior right lobe of the liver is no longer directly visualized, with adjacent biopsy/fiducial markers. Findings are consistent with treatment response. 3. Newly enlarged, low right periaortic lymph node, concerning for new nodal metastatic disease although nonspecific. Attention on follow-up. Aortic Atherosclerosis (ICD10-I70.0). Electronically Signed   By: Delanna Ahmadi M.D.   On: 03/30/2022 12:21   CT Abdomen Pelvis W Contrast  Result Date: 03/30/2022 CLINICAL DATA:  Primary Cancer Type: Lung Imaging Indication: Assess response to therapy Interval therapy since last imaging? Yes Initial Cancer Diagnosis Date: 12/23/2020; Established by: Biopsy-proven Detailed Pathology: Stage IV non-small cell lung cancer, adenocarcinoma. Primary Tumor location: Left upper lobe. Brain metastasis. Liver mass. Surgeries: Craniotomy. Appendectomy. Right oophorectomy. Chemotherapy: Yes; Ongoing?  Yes; Tagrisso daily Immunotherapy? No Radiation therapy? Yes Date Range: 02/22/2022 - 03/07/2022; Target: Brain Date Range: 01/11/2022 - 01/17/2022; Target: Left lung Date Range: 11/10/2021 - 11/19/2021; Target: Liver Date Range: 10/20/2021; Target: Brain Date Range: 04/06/2021; Target: Brain * Tracking Code: BO * EXAM: CT CHEST, ABDOMEN AND PELVIS WITH CONTRAST TECHNIQUE: Multidetector CT imaging of the chest was performed during intravenous contrast administration. RADIATION DOSE REDUCTION: This exam was performed according to the departmental dose-optimization program which includes automated exposure control, adjustment of the mA and/or kV according to patient size and/or use of iterative  reconstruction technique. CONTRAST:  32mL OMNIPAQUE IOHEXOL 300 MG/ML SOLN, additional oral enteric contrast COMPARISON:  Most recent CT chest, abdomen and pelvis 12/10/2021. 12/22/2020 PET-CT. FINDINGS: CT CHEST FINDINGS Cardiovascular: Aortic atherosclerosis. Normal heart size. No pericardial effusion. Mediastinum/Nodes: Newly enlarged, low right periaortic lymph node, measuring 1.0 x 0.9 cm (series 2, image 44). No other enlarged mediastinal, hilar, or axillary lymph nodes. Thyroid gland, trachea, and esophagus demonstrate no significant findings. Lungs/Pleura: Slightly diminished size of a spiculated mass of the posterior left upper lobe, measuring 2.0 x 1.6 cm, previously 2.1 x 2.0 cm (series 4, image 41). There is increased, bandlike scarring and ground-glass adjacent to this lesion. Mild, bandlike scarring of the bilateral lung bases. No pleural effusion or pneumothorax. Musculoskeletal: No chest wall mass or suspicious osseous lesions identified. CT ABDOMEN PELVIS FINDINGS Hepatobiliary: Previously seen subcapsular hypodense, edematous lesion of the anterior right lobe of the liver is no longer directly visualized, with adjacent biopsy/fiducial markers (series 2, image 55). No gallstones, gallbladder wall thickening, or biliary dilatation. Pancreas: Unremarkable. No pancreatic ductal dilatation or surrounding inflammatory changes. Spleen: Normal in size without significant abnormality. Adrenals/Urinary Tract: Adrenal glands are unremarkable. Kidneys are normal, without renal calculi, solid lesion, or hydronephrosis. Bladder is unremarkable. Stomach/Bowel: Stomach is within normal limits. Appendix is surgically absent. No evidence of bowel wall  thickening, distention, or inflammatory changes. Vascular/Lymphatic: Aortic atherosclerosis. No enlarged abdominal or pelvic lymph nodes. Reproductive: No mass or other abnormality. Other: No abdominal wall hernia or abnormality. No ascites. Musculoskeletal: No acute  osseous findings. IMPRESSION: 1. Slightly diminished size of a spiculated mass of the posterior left upper lobe with bandlike scarring and ground-glass adjacent to this lesion. Findings are consistent with treatment response and adjacent radiation pneumonitis/fibrosis. 2. Previously seen subcapsular lesion of the anterior right lobe of the liver is no longer directly visualized, with adjacent biopsy/fiducial markers. Findings are consistent with treatment response. 3. Newly enlarged, low right periaortic lymph node, concerning for new nodal metastatic disease although nonspecific. Attention on follow-up. Aortic Atherosclerosis (ICD10-I70.0). Electronically Signed   By: Delanna Ahmadi M.D.   On: 03/30/2022 12:21    Impression/Plan: 56. 76 yo woman with Stage IV (T1c, N1, M1c) non-small cell lung cancer, adenocarcinoma of the left lung with more than 13 new brain metastases. She appears to be recovering well from the effects of her recent whole brain radiation but does continue with some neurologic deficits with expressive aphasia, imbalance and a change in her peripheral vision.  We discussed the plan to obtain a posttreatment MRI brain scan in approximately 1 to 2 months to assess her response to treatment.  This scan will be reviewed in the multidisciplinary brain conference and the patient would like to follow-up with Dr. Mickeal Skinner thereafter to review results and continue management of her neurologic deficits.  She understands that we will continue to participate in the brain conference discussions and are more than happy to continue to participate in her care if clinically indicated in the future.  However, at this point, we will plan to see her back on an as-needed basis.  She will also continue in routine follow-up under the care and direction of Dr. Earlie Server for continued management of her systemic disease.  She knows that she is welcome to call at anytime with any questions or concerns related to her previous  radiation treatments.    Nicholos Johns, PA-C

## 2022-04-28 ENCOUNTER — Other Ambulatory Visit: Payer: Self-pay

## 2022-05-02 ENCOUNTER — Encounter: Payer: Self-pay | Admitting: Internal Medicine

## 2022-05-02 NOTE — Progress Notes (Signed)
  Radiation Oncology         [phone]) [phone] ________________________________  Name: Marie Hensley MRN: [id number]  Date: 01/17/2022  DOB: 11/29/1945  End of Treatment Note  Diagnosis:   76 y/o female with Stage IV (T1c, N1, M1c) non-small cell lung cancer, adenocarcinoma of the left lung.     Indication for treatment:  Curative, Definitive SBRT       Radiation treatment dates:   5/2, 5/4 and 01/17/22  Site/dose:   The target was treated to 54 Gy in 3 fractions of 18 Gy  Beams/energy:   The patient was treated using stereotactic body radiotherapy according to a 3D conformal radiotherapy plan.  Volumetric arc fields were employed to deliver 6 MV X-rays.  Image guidance was performed with per fraction cone beam CT prior to treatment under personal MD supervision.  Immobilization was achieved using BodyFix Pillow.  Narrative: The patient tolerated radiation treatment relatively well.     Plan: The patient has completed radiation treatment. The patient will return to radiation oncology clinic for routine followup in one month. I advised them to call or return sooner if they have any questions or concerns related to their recovery or treatment. ________________________________  Sheral Apley. Tammi Klippel, M.D.

## 2022-05-08 ENCOUNTER — Other Ambulatory Visit: Payer: Self-pay

## 2022-05-13 ENCOUNTER — Other Ambulatory Visit: Payer: Self-pay | Admitting: Family Medicine

## 2022-05-13 DIAGNOSIS — F32A Depression, unspecified: Secondary | ICD-10-CM

## 2022-05-30 ENCOUNTER — Telehealth: Payer: Self-pay

## 2022-05-30 NOTE — Telephone Encounter (Signed)
Pts daughter, [name], called stating that since Friday (05/27/22) pt has been experiencing nausea and vomiting in the mornings. And since Monday, she has been experiencing dizziness, diarrhea, joint pain and a little increase in SOBE.  Pts daughter, [name], has been advised this information will be sent to Dr. Julien Nordmann but in the mean time, please have pt COVID tested as well. [name] advised she will do an at home COVID test.

## 2022-06-01 ENCOUNTER — Other Ambulatory Visit: Payer: Self-pay

## 2022-06-01 NOTE — Telephone Encounter (Signed)
I spoke with pts daughter, [name], who advised that pt was starting to feel better and does not feel, at this time, that her appt needs to be moved up. I advised her to contact us if there are any changes. [name] expressed understanding of this.

## 2022-06-10 ENCOUNTER — Ambulatory Visit (HOSPITAL_COMMUNITY)
Admission: RE | Admit: 2022-06-10 | Discharge: 2022-06-10 | Disposition: A | Discharge status: Home / Self Care | Payer: Medicare HMO | Source: Ambulatory Visit | Attending: Radiation Oncology | Admitting: Radiation Oncology

## 2022-06-10 DIAGNOSIS — C7931 Secondary malignant neoplasm of brain: Secondary | ICD-10-CM | POA: Diagnosis not present

## 2022-06-10 DIAGNOSIS — G9389 Other specified disorders of brain: Secondary | ICD-10-CM | POA: Diagnosis not present

## 2022-06-10 MED ORDER — GADOPICLENOL 0.5 MMOL/ML IV SOLN
7.5000 mL | Freq: Once | INTRAVENOUS | Status: AC | PRN
  Administered 2022-06-10: 7.5 mL via INTRAVENOUS

## 2022-06-13 ENCOUNTER — Telehealth: Payer: Self-pay

## 2022-06-13 ENCOUNTER — Inpatient Hospital Stay: Payer: Medicare HMO | Admitting: Internal Medicine

## 2022-06-13 ENCOUNTER — Other Ambulatory Visit: Payer: Self-pay | Admitting: Internal Medicine

## 2022-06-13 ENCOUNTER — Other Ambulatory Visit: Payer: Self-pay

## 2022-06-13 ENCOUNTER — Inpatient Hospital Stay: Payer: Medicare HMO | Attending: Internal Medicine

## 2022-06-13 ENCOUNTER — Inpatient Hospital Stay (HOSPITAL_BASED_OUTPATIENT_CLINIC_OR_DEPARTMENT_OTHER): Payer: Medicare HMO | Admitting: Internal Medicine

## 2022-06-13 VITALS — BP 139/78 | HR 91 | Temp 97.9°F | Resp 17 | Ht 63.5 in | Wt 157.8 lb

## 2022-06-13 DIAGNOSIS — Z87891 Personal history of nicotine dependence: Secondary | ICD-10-CM | POA: Diagnosis not present

## 2022-06-13 DIAGNOSIS — M79605 Pain in left leg: Secondary | ICD-10-CM | POA: Diagnosis not present

## 2022-06-13 DIAGNOSIS — Z79899 Other long term (current) drug therapy: Secondary | ICD-10-CM | POA: Insufficient documentation

## 2022-06-13 DIAGNOSIS — I10 Essential (primary) hypertension: Secondary | ICD-10-CM | POA: Insufficient documentation

## 2022-06-13 DIAGNOSIS — C3412 Malignant neoplasm of upper lobe, left bronchus or lung: Secondary | ICD-10-CM | POA: Insufficient documentation

## 2022-06-13 DIAGNOSIS — G250 Essential tremor: Secondary | ICD-10-CM | POA: Diagnosis not present

## 2022-06-13 DIAGNOSIS — C7931 Secondary malignant neoplasm of brain: Secondary | ICD-10-CM | POA: Diagnosis not present

## 2022-06-13 DIAGNOSIS — C787 Secondary malignant neoplasm of liver and intrahepatic bile duct: Secondary | ICD-10-CM | POA: Insufficient documentation

## 2022-06-13 DIAGNOSIS — Z7952 Long term (current) use of systemic steroids: Secondary | ICD-10-CM | POA: Diagnosis not present

## 2022-06-13 MED ORDER — DIPHENOXYLATE-ATROPINE 2.5-0.025 MG PO TABS: 1.0000 | ORAL_TABLET | Freq: Four times a day (QID) | ORAL | 0 refills | Status: AC | PRN

## 2022-06-13 MED ORDER — PROPRANOLOL HCL 20 MG PO TABS: 20.0000 mg | ORAL_TABLET | Freq: Every day | ORAL | 1 refills | Status: AC

## 2022-06-13 NOTE — Progress Notes (Signed)
[facility] [place], [place] [address] [phone]   Interval Evaluation  Date of Service: 06/13/22 Patient Name: Marie Hensley Patient MRN: [id number] Patient DOB: 29-Nov-1945 Provider: Ventura Sellers, MD  Identifying Statement:  Marie Hensley is a 76 y.o. female with Malignant neoplasm metastatic to brain [facility])  Essential tremor   Primary Cancer:  Oncology History  Primary adenocarcinoma of left upper lobe of lung ([place])  02/23/2021 Initial Diagnosis   Primary adenocarcinoma of left upper lobe of lung ([facility])   05/06/2021 -  Chemotherapy    Patient is on Treatment Plan: LUNG NSCLC OSIMERTINIB Q28D      05/10/2021 - 05/10/2021 Chemotherapy           CNS Oncologic History: 03/04/21: Presents with seizure, MRI demonstrates likely L frontal metastasis 04/06/21: L frontal SRS [facility])  Interval History: Marie Hensley presents for follow up today following recent MRI brain.  Now 3 months removed from whole brain radiation.  No seizures since prior visit, on Keppra 1000mg  twice per day.  Continues on [place] with Dr. Julien Nordmann.  Speech output is still impaired but unchanged from prior.  Her right side strength remains intact. Continues on 1mg  daily decadron.  H+P (03/09/21) Patient presented to medical attention with sudden onset loss of speech output.  This was noticed by family on 03/04/21, she had been in normal state of health at home immediately prior.  She returned to baseline after several hours and ED visit, CNS imaging demonstrated an enhancing mass in the left frontal lobe.  She was started on Keppra 500mg  twice per day, no recurrence of seizure activity since that time.  She does acknowledge "some difficulty" with getting words out at times.  Has known lung mass, underwent bronchoscopy x2 without formal diagnostic result.  Is considering IR biopsy next month given very high likelihood of primary lung  malignancy.     Medications: Current Outpatient Medications on File Prior to Visit  Medication Sig Dispense Refill   acetaminophen (TYLENOL) 500 MG tablet Take 1,000 mg by mouth every 6 (six) hours as needed for mild pain (or arthritis).     atorvastatin (LIPITOR) 40 MG tablet Take 1 tablet (40 mg total) by mouth at bedtime. 90 tablet 3   cetirizine (ZYRTEC) 10 MG tablet Take 10 mg by mouth daily as needed for allergies or rhinitis.     citalopram (CELEXA) 20 MG tablet TAKE ONE TABLET BY MOUTH AT BEDTIME FOR DEPRESSION 90 tablet 1   dexamethasone (DECADRON) 1 MG tablet TAKE ONE TABLET BY MOUTH ONE TIME DAILY 30 tablet 3   diphenoxylate-atropine (LOMOTIL) 2.5-0.025 MG tablet Take 1 tablet by mouth 4 (four) times daily as needed for diarrhea or loose stools. 30 tablet 0   fluticasone (FLONASE) 50 MCG/ACT nasal spray Place 1 spray into both nostrils daily as needed for allergies.     levETIRAcetam (KEPPRA) 500 MG tablet TAKE TWO TABLETS BY MOUTH TWICE A DAY 120 tablet 3   loperamide (IMODIUM A-D) 2 MG tablet Take 2 mg by mouth 2 (two) times daily as needed for diarrhea or loose stools.     osimertinib mesylate (TAGRISSO) 40 MG tablet Take 1 tablet (40 mg total) by mouth daily. 30 tablet 5   prochlorperazine (COMPAZINE) 10 MG tablet Take 1 tablet (10 mg total) by mouth every 6 (six) hours as needed for nausea or vomiting. 30 tablet 0   oxyCODONE (OXY IR/ROXICODONE) 5 MG immediate  release tablet Take 1 tablet (5 mg total) by mouth 3 (three) times daily as needed for severe pain. (Patient not taking: Reported on 06/13/2022) 20 tablet 0   propranolol ER (INDERAL LA) 60 MG 24 hr capsule Take 1 capsule (60 mg total) by mouth at bedtime. (Patient not taking: Reported on 06/13/2022) 90 capsule 3   No current facility-administered medications on file prior to visit.    Allergies: No Known Allergies Past Medical History:  Past Medical History:  Diagnosis Date   Arthritis    Brain tumor ([facility])     Depression    Dyspnea    WITH EXERTION-SEEING DR [name]   HTN (hypertension)    Hyperlipemia    Seizures ([facility])    Past Surgical History:  Past Surgical History:  Procedure Laterality Date   APPENDECTOMY  0762   APPLICATION OF CRANIAL NAVIGATION N/A 04/08/2021   Procedure: APPLICATION OF CRANIAL NAVIGATION;  Surgeon: Judith Part, MD;  Location: [facility];  Service: Neurosurgery;  Laterality: N/A;  RM 21   CRANIOTOMY Left 04/08/2021   Procedure: Left Craniotomy for tumor resection with brainlab;  Surgeon: Judith Part, MD;  Location: [facility];  Service: Neurosurgery;  Laterality: Left;   EYE SURGERY Bilateral    CATARACTS   OOPHORECTOMY Right    VIDEO BRONCHOSCOPY WITH ENDOBRONCHIAL NAVIGATION Left 12/23/2020   Procedure: VIDEO BRONCHOSCOPY WITH ENDOBRONCHIAL NAVIGATION;  Surgeon: Tyler Pita, MD;  Location: [facility];  Service: Pulmonary;  Laterality: Left;   VIDEO BRONCHOSCOPY WITH ENDOBRONCHIAL NAVIGATION Left 02/01/2021   Procedure: ROBOTIC ASSISTED VIDEO BRONCHOSCOPY WITH ENDOBRONCHIAL NAVIGATION;  Surgeon: Tyler Pita, MD;  Location: [facility];  Service: Pulmonary;  Laterality: Left;   VIDEO BRONCHOSCOPY WITH ENDOBRONCHIAL ULTRASOUND Left 12/23/2020   Procedure: VIDEO BRONCHOSCOPY WITH ENDOBRONCHIAL ULTRASOUND;  Surgeon: Tyler Pita, MD;  Location: [facility];  Service: Pulmonary;  Laterality: Left;   VIDEO BRONCHOSCOPY WITH ENDOBRONCHIAL ULTRASOUND Left 02/01/2021   Procedure: ROBOTIC ASSITSTED VIDEO BRONCHOSCOPY WITH ENDOBRONCHIAL ULTRASOUND;  Surgeon: Tyler Pita, MD;  Location: [facility];  Service: Pulmonary;  Laterality: Left;   Social History:  Social History   Socioeconomic History   Marital status: Single    Spouse name: Not on file   Number of children: 3   Years of education: high school    Highest education level: Not on file  Occupational History   Occupation: retired  Tobacco Use   Smoking status: Former    Packs/day: 0.50    Years:  20.00    Total pack years: 10.00    Types: Cigarettes    Quit date: 10/27/1989    Years since quitting: 32.6   Smokeless tobacco: Never   Tobacco comments:    Smoked off and on in 20 year period  Vaping Use   Vaping Use: Never used  Substance and Sexual Activity   Alcohol use: Not Currently   Drug use: Never   Sexual activity: Not Currently    Birth control/protection: Post-menopausal  Other Topics Concern   Not on file  Social History Narrative   She has discussed advanced directives with her.  She would want CPR and intubation if chance of recovery.   11/19/20   From: [place], moved for work 1998   Living: alone   Work: retired Energy manager of [employer]: 3 children - [name], Tomasa Blase - 5 grandchildren, 1 great-grandchild      Enjoys: shop, casino, cruise travel  Exercise: not as much due to the fatigue   Diet: tries to eat healthy, but eats what she wants      Safety   Seat belts: Yes    Guns: No   Safe in relationships: Yes    Social Determinants of Health   Financial Resource Strain: Low Risk  (10/28/2021)   Overall Financial Resource Strain (CARDIA)    Difficulty of Paying Living Expenses: Not hard at all  Food Insecurity: No Food Insecurity (10/28/2021)   Hunger Vital Sign    Worried About Running Out of Food in the Last Year: Never true    Ran Out of Food in the Last Year: Never true  Transportation Needs: No Transportation Needs (10/28/2021)   PRAPARE - Hydrologist (Medical): No    Lack of Transportation (Non-Medical): No  Physical Activity: Insufficiently Active (10/28/2021)   Exercise Vital Sign    Days of Exercise per Week: 3 days    Minutes of Exercise per Session: 20 min  Stress: No Stress Concern Present (10/28/2021)   [place]    Feeling of Stress : Not at all  Social Connections: Socially Isolated (10/28/2021)   Social Connection and  Isolation Panel [NHANES]    Frequency of Communication with Friends and Family: More than three times a week    Frequency of Social Gatherings with Friends and Family: Three times a week    Attends Religious Services: Never    Active Member of Clubs or Organizations: No    Attends Archivist Meetings: Never    Marital Status: Never married  Intimate Partner Violence: Not At Risk (10/28/2021)   Humiliation, Afraid, Rape, and Kick questionnaire    Fear of Current or Ex-Partner: No    Emotionally Abused: No    Physically Abused: No    Sexually Abused: No   Family History:  Family History  Problem Relation Age of Onset   Addison's disease Daughter    Hypertension Mother        died at 44   Alcohol abuse Father     Review of Systems: Constitutional: Doesn't report fevers, chills or abnormal weight loss Eyes: Doesn't report blurriness of vision Ears, nose, mouth, throat, and face: Doesn't report sore throat Respiratory: Doesn't report cough, dyspnea or wheezes Cardiovascular: Doesn't report palpitation, chest discomfort  Gastrointestinal:  Doesn't report nausea, constipation, diarrhea GU: Doesn't report incontinence Skin: Doesn't report skin rashes Neurological: Per HPI Musculoskeletal: Doesn't report joint pain Behavioral/Psych: Doesn't report anxiety  Physical Exam: Wt Readings from Last 3 Encounters:  06/13/22 157 lb 12.8 oz (71.6 kg)  04/22/22 167 lb 8 oz (76 kg)  03/31/22 169 lb (76.7 kg)   Temp Readings from Last 3 Encounters:  06/13/22 97.9 F (36.6 C) (Temporal)  04/22/22 98.6 F (37 C)  03/31/22 98 F (36.7 C) (Oral)   BP Readings from Last 3 Encounters:  06/13/22 139/78  04/22/22 130/72  03/31/22 137/83   Pulse Readings from Last 3 Encounters:  06/13/22 91  04/22/22 87  03/31/22 78    KPS: 70. General: Alert, cooperative, pleasant, in no acute distress Head: Normal EENT: No conjunctival injection or scleral icterus.  Lungs: Resp effort  normal Cardiac: Regular rate Abdomen: Non-distended abdomen Skin: No rashes cyanosis or petechiae. Extremities: No clubbing or edema  Neurologic Exam: Mental Status: Awake, alert, attentive to examiner. Oriented to self and environment. Noted expressive aphasia.  Cranial Nerves: Visual acuity is  grossly normal. Visual fields are full. Extra-ocular movements intact. No ptosis. Face is symmetric Motor: Tone and bulk are normal. Power is full in both arms and legs. Reflexes are symmetric, no pathologic reflexes present.  Sensory: Intact to light touch Gait: Normal.   Labs: I have reviewed the data as listed    Component Value Date/Time   NA 140 03/29/2022 1333   K 3.7 03/29/2022 1333   CL 105 03/29/2022 1333   CO2 26 03/29/2022 1333   GLUCOSE 108 (H) 03/29/2022 1333   BUN 18 03/29/2022 1333   CREATININE 1.35 (H) 03/29/2022 1333   CALCIUM 9.7 03/29/2022 1333   PROT 6.9 03/29/2022 1333   ALBUMIN 4.0 03/29/2022 1333   AST 20 03/29/2022 1333   ALT 21 03/29/2022 1333   ALKPHOS 53 03/29/2022 1333   BILITOT 0.8 03/29/2022 1333   GFRNONAA 40 (L) 03/29/2022 1333   Lab Results  Component Value Date   WBC 7.5 03/29/2022   NEUTROABS 5.5 03/29/2022   HGB 13.0 03/29/2022   HCT 39.5 03/29/2022   MCV 92.1 03/29/2022   PLT 147 (L) 03/29/2022    Imaging:  [facility] Clinician Interpretation: I have personally reviewed the CNS images as listed.  My interpretation, in the context of the patient's clinical presentation, is treatment effect vs true progression  MR Brain W Wo Contrast  Result Date: 06/10/2022 CLINICAL DATA:  Brain/CNS neoplasm, assess treatment response EXAM: MRI HEAD WITHOUT AND WITH CONTRAST TECHNIQUE: Multiplanar, multiecho pulse sequences of the brain and surrounding structures were obtained without and with intravenous contrast. CONTRAST:  7.5 mL VueWay IV COMPARISON:  MRI head Feb 02, 2022. FINDINGS: Brain: Interval increase in size of the enhancing metastasis within the  right frontal lobe (now 14 mm on series 1100, image 233) and a lesion in the right postcentral gyrus (now 6 mm on series 1100, image 217). Possible punctate new lesion in the right frontal lobe (series 1100, image 236). Other visible lesions appears similar interval marked on series 1100. Artifact limits assessment in the lateral aspect the right cerebrum, largely obscured brain on postcontrast imaging. T2/FLAIR surrounding many these lesions, including increased edema in the right frontal lobe. No evidence of acute infarct, acute hemorrhage, significant mass effect, or hydrocephalus. Vascular: Major arterial flow voids are maintained at the skull base. Skull and upper cervical spine: Left frontal craniotomy. Otherwise, unremarkable marrow. Sinuses/Orbits: Clear sinuses.  No acute orbital findings. Other: No sizable mastoid effusions. IMPRESSION: 1. Possible new punctate lesion in the right frontal lobe. 2. Interval increase in size in right frontal and right parietal lesions. Increased associated surrounding edema in the right frontal lobe. 3. Other visible lesions appear similar. Artifact limits assessment in the lateral aspect the right cerebrum, largely obscured brain on postcontrast imaging. Electronically Signed   By: Margaretha Sheffield M.D.   On: 06/10/2022 17:53    Assessment/Plan Malignant neoplasm metastatic to brain [facility])  Essential tremor  KENDYLL HUETTNER is clinically progressive today, with ongoing gradual decline in expressive language, processing speed, fatigue.  Brain MRI demonstrates increase in volume of treated right frontal lesion, and faint area of new enhancement adjacent.  Etiology is suspected RT treatment effect.  We will continue to follow with imaging at this time.  We recommended continuing Keppra 1000mg  BID.  Decadron may be Dc'd.  Can resume low dose propanolol, 20mg  daily, for recurrent tremor.  She will continue Tagrisso for now with lung team.  We ask that [name] return to clinic  in 3 months with brain MRI, or sooner as needed.  All questions were answered. The patient knows to call the clinic with any problems, questions or concerns. No barriers to learning were detected.  The total time spent in the encounter was 30 minutes and more than 50% was on counseling and review of test results   Ventura Sellers, MD Medical Director of Neuro-Oncology [facility] at [place] 06/13/22 12:59 PM

## 2022-06-13 NOTE — Telephone Encounter (Signed)
Pts daughter LVM requesting a refill of Lomotil.

## 2022-06-15 ENCOUNTER — Other Ambulatory Visit: Payer: Self-pay | Admitting: Radiation Therapy

## 2022-06-16 ENCOUNTER — Other Ambulatory Visit: Payer: Self-pay

## 2022-06-17 ENCOUNTER — Other Ambulatory Visit: Payer: Self-pay

## 2022-06-27 ENCOUNTER — Other Ambulatory Visit: Payer: Self-pay

## 2022-06-27 ENCOUNTER — Encounter: Payer: Self-pay | Admitting: Internal Medicine

## 2022-06-27 ENCOUNTER — Telehealth: Payer: Self-pay | Admitting: Internal Medicine

## 2022-06-27 ENCOUNTER — Telehealth: Payer: Self-pay | Admitting: Medical Oncology

## 2022-06-27 ENCOUNTER — Ambulatory Visit (HOSPITAL_BASED_OUTPATIENT_CLINIC_OR_DEPARTMENT_OTHER)
Admission: RE | Admit: 2022-06-27 | Discharge: 2022-06-27 | Disposition: A | Discharge status: Home / Self Care | Payer: Medicare HMO | Source: Ambulatory Visit | Attending: Physician Assistant | Admitting: Physician Assistant

## 2022-06-27 ENCOUNTER — Other Ambulatory Visit (HOSPITAL_COMMUNITY): Payer: Self-pay

## 2022-06-27 ENCOUNTER — Inpatient Hospital Stay (HOSPITAL_BASED_OUTPATIENT_CLINIC_OR_DEPARTMENT_OTHER): Payer: Medicare HMO | Admitting: Physician Assistant

## 2022-06-27 ENCOUNTER — Inpatient Hospital Stay: Payer: Medicare HMO

## 2022-06-27 ENCOUNTER — Telehealth: Payer: Self-pay

## 2022-06-27 VITALS — BP 108/69 | HR 81 | Temp 97.7°F | Resp 18 | Wt 154.8 lb

## 2022-06-27 DIAGNOSIS — M79605 Pain in left leg: Secondary | ICD-10-CM | POA: Insufficient documentation

## 2022-06-27 DIAGNOSIS — C3490 Malignant neoplasm of unspecified part of unspecified bronchus or lung: Secondary | ICD-10-CM | POA: Diagnosis not present

## 2022-06-27 DIAGNOSIS — Z79899 Other long term (current) drug therapy: Secondary | ICD-10-CM | POA: Diagnosis not present

## 2022-06-27 DIAGNOSIS — C787 Secondary malignant neoplasm of liver and intrahepatic bile duct: Secondary | ICD-10-CM

## 2022-06-27 DIAGNOSIS — M7989 Other specified soft tissue disorders: Secondary | ICD-10-CM

## 2022-06-27 DIAGNOSIS — C7931 Secondary malignant neoplasm of brain: Secondary | ICD-10-CM | POA: Diagnosis not present

## 2022-06-27 DIAGNOSIS — D696 Thrombocytopenia, unspecified: Secondary | ICD-10-CM | POA: Diagnosis not present

## 2022-06-27 DIAGNOSIS — Z7952 Long term (current) use of systemic steroids: Secondary | ICD-10-CM | POA: Diagnosis not present

## 2022-06-27 DIAGNOSIS — C3412 Malignant neoplasm of upper lobe, left bronchus or lung: Secondary | ICD-10-CM | POA: Diagnosis not present

## 2022-06-27 DIAGNOSIS — I10 Essential (primary) hypertension: Secondary | ICD-10-CM | POA: Diagnosis not present

## 2022-06-27 DIAGNOSIS — I824Y2 Acute embolism and thrombosis of unspecified deep veins of left proximal lower extremity: Secondary | ICD-10-CM

## 2022-06-27 DIAGNOSIS — Z87891 Personal history of nicotine dependence: Secondary | ICD-10-CM | POA: Diagnosis not present

## 2022-06-27 LAB — CMP (CANCER CENTER ONLY)
ALT: 19 U/L (ref 0–44)
AST: 21 U/L (ref 15–41)
Albumin: 3.7 g/dL (ref 3.5–5.0)
Alkaline Phosphatase: 100 U/L (ref 38–126)
Anion gap: 7 (ref 5–15)
BUN: 22 mg/dL (ref 8–23)
CO2: 24 mmol/L (ref 22–32)
Calcium: 9.2 mg/dL (ref 8.9–10.3)
Chloride: 105 mmol/L (ref 98–111)
Creatinine: 1.29 mg/dL — ABNORMAL HIGH (ref 0.44–1.00)
GFR, Estimated: 43 mL/min — ABNORMAL LOW (ref >60)
Glucose, Bld: 117 mg/dL — ABNORMAL HIGH (ref 70–99)
Potassium: 3.4 mmol/L — ABNORMAL LOW (ref 3.5–5.1)
Sodium: 136 mmol/L (ref 135–145)
Total Bilirubin: 0.6 mg/dL (ref 0.3–1.2)
Total Protein: 6.6 g/dL (ref 6.5–8.1)

## 2022-06-27 LAB — CBC WITH DIFFERENTIAL (CANCER CENTER ONLY)
Abs Immature Granulocytes: 0.29 10*3/uL — ABNORMAL HIGH (ref 0.00–0.07)
Basophils Absolute: 0 10*3/uL (ref 0.0–0.1)
Basophils Relative: 0 %
Eosinophils Absolute: 0.2 10*3/uL (ref 0.0–0.5)
Eosinophils Relative: 2 %
HCT: 33.6 % — ABNORMAL LOW (ref 36.0–46.0)
Hemoglobin: 11.1 g/dL — ABNORMAL LOW (ref 12.0–15.0)
Immature Granulocytes: 3 %
Lymphocytes Relative: 13 %
Lymphs Abs: 1.2 10*3/uL (ref 0.7–4.0)
MCH: 30.3 pg (ref 26.0–34.0)
MCHC: 33 g/dL (ref 30.0–36.0)
MCV: 91.8 fL (ref 80.0–100.0)
Monocytes Absolute: 0.9 10*3/uL (ref 0.1–1.0)
Monocytes Relative: 9 %
Neutro Abs: 6.8 10*3/uL (ref 1.7–7.7)
Neutrophils Relative %: 73 %
Platelet Count: 112 10*3/uL — ABNORMAL LOW (ref 150–400)
RBC: 3.66 MIL/uL — ABNORMAL LOW (ref 3.87–5.11)
RDW: 14.2 % (ref 11.5–15.5)
WBC Count: 9.4 10*3/uL (ref 4.0–10.5)
nRBC: 0 % (ref 0.0–0.2)

## 2022-06-27 MED ORDER — APIXABAN (ELIQUIS) VTE STARTER PACK (10MG AND 5MG)
ORAL_TABLET | ORAL | 0 refills | Status: AC
  Filled 2022-06-27: qty 74, 28d supply, fill #0

## 2022-06-27 NOTE — Telephone Encounter (Signed)
This RN contacted patient's daughter per secure chat from Abelina Bachelor, RN. Patient scheduled for labs at 110 and [facility] visit at 1230 for left leg swelling and pain. Patient's daughter verbalized understanding of appointment time and location

## 2022-06-27 NOTE — Telephone Encounter (Signed)
Dtr reported -Pt has "L calf pain and swelling, warm to touch x 4-5 days. She fell last Sunday and pain started after that".

## 2022-06-27 NOTE — Progress Notes (Signed)
Symptom Management Consult note [name]    Patient Care Team: Waunita Schooner, MD as PCP - General (Family Medicine)    Name of the patient: Marie Hensley  [id number]  Oct 04, 1945   Date of visit: 06/27/2022   Chief Complaint/Reason for visit: left calf pain   Current Therapy: [place]: Patient is a 76 y.o. female  with oncologic history of  metastatic Stage IV (T1c, N1, M1c) non-small cell lung cancer, adenocarcinoma  followed by Dr. Julien Nordmann.  I have viewed most recent oncology note and lab work.    #) Stage IV (T1c, N1, M1c) non-small cell lung cancer, adenocarcinoma  -On PO chemo, reports compliance with this. - Next appointment with oncologist is 07/06/22  #) Left calf pain -DVT study positive for left lower extremity DVT extending from femoral vein to gastrocnemius -CMP without significant electrolyte derangement. Renal function consistent similar to baseline. -Dr. Julien Nordmann made aware of findings and is agreeable with plan to start eliquis. Starter pack sent to pharmacy. -Discussed precautions while on anticoagulation with patient and daughter   #) Thrombocytopenia -CBC with platelets 112, chart review shows in the last 7 months range is 112-147. -Denies active bleeding. Discussed while on anticoagulation patient needs to watch for signs of bleeding  Heme/Onc History: Oncology History  Primary adenocarcinoma of left upper lobe of lung ([place])  02/23/2021 Initial Diagnosis   Primary adenocarcinoma of left upper lobe of lung ([place])   05/06/2021 -  Chemotherapy    Patient is on Treatment Plan: LUNG NSCLC OSIMERTINIB Q28D      05/10/2021 - 05/10/2021 Chemotherapy             Interval history-: Marie Hensley is a 76 y.o. female with oncologic history as above presenting to [facility] today with chief complaint of left leg pain and swelling x 2 weeks.  Patient's daughter accompanies her and provides additional history.  Pain  is worse behind the knee and radiates down her leg.  She describes the pain as sharp.  She rates the pain 8 out of 10 in severity.  She occasionally will take Tylenol or hydrocodone with some pain relief.  Denies taking any pain medication today.  She states the swelling is constant and does not improve with elevation.  She states the leg pain is worse with ambulation.  She did have a fall x1 week ago in the garage when she landed on her right side.  She did not hit her head or lose consciousness.  She did not seek ER evaluation at that time and has been ambulatory since the fall without assistance.  Patient is not as active as she used to be however still is able to complete her ADLs and get around the house to feed the animals.  She denies any recent surgery or long period of immobilization.  Denies any chest pain or shortness of breath, numbness or tingling. No history of PE or DVT, never been on anticoagulation.      ROS  All other systems are reviewed and are negative for acute change except as noted in the HPI.    No Known Allergies   Past Medical History:  Diagnosis Date   Arthritis    Brain tumor ([place])    Depression    Dyspnea    WITH EXERTION-SEEING DR [name]   HTN (hypertension)    Hyperlipemia    Seizures (HCC)      Past Surgical History:  Procedure Laterality Date   APPENDECTOMY  2202   APPLICATION OF CRANIAL NAVIGATION N/A 04/08/2021   Procedure: APPLICATION OF CRANIAL NAVIGATION;  Surgeon: Judith Part, MD;  Location: [place];  Service: Neurosurgery;  Laterality: N/A;  RM 21   CRANIOTOMY Left 04/08/2021   Procedure: Left Craniotomy for tumor resection with brainlab;  Surgeon: Judith Part, MD;  Location: [place];  Service: Neurosurgery;  Laterality: Left;   EYE SURGERY Bilateral    CATARACTS   OOPHORECTOMY Right    VIDEO BRONCHOSCOPY WITH ENDOBRONCHIAL NAVIGATION Left 12/23/2020   Procedure: VIDEO BRONCHOSCOPY WITH ENDOBRONCHIAL NAVIGATION;  Surgeon:  Tyler Pita, MD;  Location: [facility];  Service: Pulmonary;  Laterality: Left;   VIDEO BRONCHOSCOPY WITH ENDOBRONCHIAL NAVIGATION Left 02/01/2021   Procedure: ROBOTIC ASSISTED VIDEO BRONCHOSCOPY WITH ENDOBRONCHIAL NAVIGATION;  Surgeon: Tyler Pita, MD;  Location: [facility];  Service: Pulmonary;  Laterality: Left;   VIDEO BRONCHOSCOPY WITH ENDOBRONCHIAL ULTRASOUND Left 12/23/2020   Procedure: VIDEO BRONCHOSCOPY WITH ENDOBRONCHIAL ULTRASOUND;  Surgeon: Tyler Pita, MD;  Location: [facility];  Service: Pulmonary;  Laterality: Left;   VIDEO BRONCHOSCOPY WITH ENDOBRONCHIAL ULTRASOUND Left 02/01/2021   Procedure: ROBOTIC ASSITSTED VIDEO BRONCHOSCOPY WITH ENDOBRONCHIAL ULTRASOUND;  Surgeon: Tyler Pita, MD;  Location: [facility];  Service: Pulmonary;  Laterality: Left;    Social History   Socioeconomic History   Marital status: Single    Spouse name: Not on file   Number of children: 3   Years of education: high school    Highest education level: Not on file  Occupational History   Occupation: retired  Tobacco Use   Smoking status: Former    Packs/day: 0.50    Years: 20.00    Total pack years: 10.00    Types: Cigarettes    Quit date: 10/27/1989    Years since quitting: 32.6   Smokeless tobacco: Never   Tobacco comments:    Smoked off and on in 20 year period  Vaping Use   Vaping Use: Never used  Substance and Sexual Activity   Alcohol use: Not Currently   Drug use: Never   Sexual activity: Not Currently    Birth control/protection: Post-menopausal  Other Topics Concern   Not on file  Social History Narrative   She has discussed advanced directives with her.  She would want CPR and intubation if chance of recovery.   11/19/20   From: [place], moved for work 1998   Living: alone   Work: retired Energy manager of [employer]: 3 children - [name], [name], [name] - 5 grandchildren, 1 great-grandchild      Enjoys: shop, casino, cruise travel      Exercise: not  as much due to the fatigue   Diet: tries to eat healthy, but eats what she wants      Safety   Seat belts: Yes    Guns: No   Safe in relationships: Yes    Social Determinants of Radio broadcast assistant Strain: Low Risk  (10/28/2021)   Overall Financial Resource Strain (CARDIA)    Difficulty of Paying Living Expenses: Not hard at all  Food Insecurity: No Food Insecurity (10/28/2021)   Hunger Vital Sign    Worried About Running Out of Food in the Last Year: Never true    [place] in the Last Year: Never true  Transportation Needs: No Transportation Needs (10/28/2021)   PRAPARE - Transportation    Lack  of Transportation (Medical): No    Lack of Transportation (Non-Medical): No  Physical Activity: Insufficiently Active (10/28/2021)   Exercise Vital Sign    Days of Exercise per Week: 3 days    Minutes of Exercise per Session: 20 min  Stress: No Stress Concern Present (10/28/2021)   [place]    Feeling of Stress : Not at all  Social Connections: Socially Isolated (10/28/2021)   Social Connection and Isolation Panel [NHANES]    Frequency of Communication with Friends and Family: More than three times a week    Frequency of Social Gatherings with Friends and Family: Three times a week    Attends Religious Services: Never    Active Member of Clubs or Organizations: No    Attends Archivist Meetings: Never    Marital Status: Never married  Intimate Partner Violence: Not At Risk (10/28/2021)   Humiliation, Afraid, Rape, and Kick questionnaire    Fear of Current or Ex-Partner: No    Emotionally Abused: No    Physically Abused: No    Sexually Abused: No    Family History  Problem Relation Age of Onset   Addison's disease Daughter    Hypertension Mother        died at 30   Alcohol abuse Father      Current Outpatient Medications:    APIXABAN (ELIQUIS) VTE STARTER PACK (10MG  AND 5MG ), Take as  directed on package: start with two-5mg  tablets twice daily for 7 days. On day 8, switch to one-5mg  tablet twice daily., Disp: 74 tablet, Rfl: 0   acetaminophen (TYLENOL) 500 MG tablet, Take 1,000 mg by mouth every 6 (six) hours as needed for mild pain (or arthritis)., Disp: , Rfl:    atorvastatin (LIPITOR) 40 MG tablet, Take 1 tablet (40 mg total) by mouth at bedtime., Disp: 90 tablet, Rfl: 3   cetirizine (ZYRTEC) 10 MG tablet, Take 10 mg by mouth daily as needed for allergies or rhinitis., Disp: , Rfl:    citalopram (CELEXA) 20 MG tablet, TAKE ONE TABLET BY MOUTH AT BEDTIME FOR DEPRESSION, Disp: 90 tablet, Rfl: 1   diphenoxylate-atropine (LOMOTIL) 2.5-0.025 MG tablet, Take 1 tablet by mouth 4 (four) times daily as needed for diarrhea or loose stools., Disp: 30 tablet, Rfl: 0   fluticasone (FLONASE) 50 MCG/ACT nasal spray, Place 1 spray into both nostrils daily as needed for allergies., Disp: , Rfl:    levETIRAcetam (KEPPRA) 500 MG tablet, TAKE TWO TABLETS BY MOUTH TWICE A DAY, Disp: 120 tablet, Rfl: 3   loperamide (IMODIUM A-D) 2 MG tablet, Take 2 mg by mouth 2 (two) times daily as needed for diarrhea or loose stools., Disp: , Rfl:    osimertinib mesylate (TAGRISSO) 40 MG tablet, Take 1 tablet (40 mg total) by mouth daily., Disp: 30 tablet, Rfl: 5   oxyCODONE (OXY IR/ROXICODONE) 5 MG immediate release tablet, Take 1 tablet (5 mg total) by mouth 3 (three) times daily as needed for severe pain. (Patient not taking: Reported on 06/13/2022), Disp: 20 tablet, Rfl: 0   prochlorperazine (COMPAZINE) 10 MG tablet, Take 1 tablet (10 mg total) by mouth every 6 (six) hours as needed for nausea or vomiting., Disp: 30 tablet, Rfl: 0   propranolol (INDERAL) 20 MG tablet, Take 1 tablet (20 mg total) by mouth daily., Disp: 60 tablet, Rfl: 1  PHYSICAL EXAM: ECOG FS:2 - Symptomatic, <50% confined to bed    Vitals:   06/27/22 1243  BP:  108/69  Pulse: 81  Resp: 18  Temp: 97.7 F (36.5 C)  TempSrc: Oral  SpO2:  100%  Weight: 154 lb 12.8 oz (70.2 kg)   Physical Exam Vitals and nursing note reviewed.  Constitutional:      Appearance: She is well-developed. She is not ill-appearing or toxic-appearing.  HENT:     Head: Normocephalic.     Nose: Nose normal.  Eyes:     Conjunctiva/sclera: Conjunctivae normal.  Neck:     Vascular: No JVD.  Cardiovascular:     Rate and Rhythm: Normal rate and regular rhythm.     Pulses: Normal pulses.          Dorsalis pedis pulses are 2+ on the left side.     Heart sounds: Normal heart sounds.  Pulmonary:     Effort: Pulmonary effort is normal.     Breath sounds: Normal breath sounds.  Abdominal:     General: There is no distension.  Musculoskeletal:     Cervical back: Normal range of motion.     Right lower leg: No edema.     Left lower leg: Edema present.     Comments: Full ROM of LLE with soft compartments 1+ pitting edema of LLE. + Homans sign on LLE.  Skin:    General: Skin is warm and dry.     Comments: Equal tactile temperature in BLE  Neurological:     Mental Status: She is oriented to person, place, and time.        LABORATORY DATA: I have reviewed the data as listed    Latest Ref Rng & Units 06/27/2022   12:17 PM 03/29/2022    1:33 PM 01/31/2022    3:16 PM  CBC  WBC 4.0 - 10.5 K/uL 9.4  7.5  6.7   Hemoglobin 12.0 - 15.0 g/dL 11.1  13.0  12.2   Hematocrit 36.0 - 46.0 % 33.6  39.5  36.5   Platelets 150 - 400 K/uL 112  147  121         Latest Ref Rng & Units 06/27/2022   12:17 PM 03/29/2022    1:33 PM 01/31/2022    3:16 PM  CMP  Glucose 70 - 99 mg/dL 117  108  164   BUN 8 - 23 mg/dL 22  18  26    Creatinine 0.44 - 1.00 mg/dL 1.29  1.35  1.45   Sodium 135 - 145 mmol/L 136  140  139   Potassium 3.5 - 5.1 mmol/L 3.4  3.7  3.7   Chloride 98 - 111 mmol/L 105  105  105   CO2 22 - 32 mmol/L 24  26  27    Calcium 8.9 - 10.3 mg/dL 9.2  9.7  9.1   Total Protein 6.5 - 8.1 g/dL 6.6  6.9  6.6   Total Bilirubin 0.3 - 1.2 mg/dL 0.6  0.8   0.6   Alkaline Phos 38 - 126 U/L 100  53  48   AST 15 - 41 U/L 21  20  22    ALT 0 - 44 U/L 19  21  20         RADIOGRAPHIC STUDIES (from last 24 hours if applicable) I have personally reviewed the radiological images as listed and agreed with the findings in the report. VAS [name] LOWER EXTREMITY VENOUS (DVT)  Result Date: 06/27/2022  Lower Venous DVT Study Patient Name:  Marie Hensley  Date of Exam:   06/27/2022 Medical Rec #: [id number]  Accession #:    [id number] Date of Birth: 07-02-1946          Patient Gender: F Patient Age:   48 years Exam Location:  [facility] [address] Procedure:      VAS [name] LOWER EXTREMITY VENOUS (DVT) Referring Phys: Sherol Dade --------------------------------------------------------------------------------  Indications: Swelling, and Pain.  Risk Factors: Cancer. Comparison Study: No prior studies. Performing Technologist: Oliver Hum RVT  Examination Guidelines: A complete evaluation includes B-mode imaging, spectral Doppler, color Doppler, and power Doppler as needed of all accessible portions of each vessel. Bilateral testing is considered an integral part of a complete examination. Limited examinations for reoccurring indications may be performed as noted. The reflux portion of the exam is performed with the patient in reverse Trendelenburg.  +-----+---------------+---------+-----------+----------+--------------+ RIGHTCompressibilityPhasicitySpontaneityPropertiesThrombus Aging +-----+---------------+---------+-----------+----------+--------------+ CFV  Full           Yes      Yes                                 +-----+---------------+---------+-----------+----------+--------------+   +---------+---------------+---------+-----------+----------+--------------+ LEFT     CompressibilityPhasicitySpontaneityPropertiesThrombus Aging +---------+---------------+---------+-----------+----------+--------------+ CFV      Full            Yes      Yes                                 +---------+---------------+---------+-----------+----------+--------------+ SFJ      Full                                                        +---------+---------------+---------+-----------+----------+--------------+ FV Prox  None           No       No                   Acute          +---------+---------------+---------+-----------+----------+--------------+ FV Mid   None           No       No                   Acute          +---------+---------------+---------+-----------+----------+--------------+ FV DistalNone           No       No                   Acute          +---------+---------------+---------+-----------+----------+--------------+ PFV      Full                                                        +---------+---------------+---------+-----------+----------+--------------+ POP      None           No       No                   Acute          +---------+---------------+---------+-----------+----------+--------------+ PTV      None  Acute          +---------+---------------+---------+-----------+----------+--------------+ PERO     None                                         Acute          +---------+---------------+---------+-----------+----------+--------------+ Gastroc  None                                         Acute          +---------+---------------+---------+-----------+----------+--------------+    Summary: RIGHT: - No evidence of common femoral vein obstruction.  LEFT: - Findings consistent with acute deep vein thrombosis involving the left femoral vein, left popliteal vein, left posterior tibial veins, left peroneal veins, and left gastrocnemius veins. - No cystic structure found in the popliteal fossa.  *See table(s) above for measurements and observations.    Preliminary         Visit Diagnosis: 1. Left leg pain   2. Acute deep  vein thrombosis (DVT) of proximal vein of left lower extremity (HCC)   3. Thrombocytopenia ([place])   4. Non-small cell lung cancer metastatic to liver ([place])      No orders of the defined types were placed in this encounter.   All questions were answered. The patient knows to call the clinic with any problems, questions or concerns. No barriers to learning was detected.  I have spent a total of 30 minutes minutes of face-to-face and non-face-to-face time, preparing to see the patient, obtaining and/or reviewing separately obtained history, performing a medically appropriate examination, counseling and educating the patient, ordering tests, documenting clinical information in the electronic health record, and care coordination (communications with other health care professionals or caregivers).    Thank you for allowing me to participate in the care of this patient.    Barrie Folk, PA-C Department of Hematology/Oncology [facility], [place] at [facility] Phone: [phone]  [phone]    06/27/2022 3:43 PM

## 2022-06-27 NOTE — Progress Notes (Signed)
Lab orders entered for [facility] visit.

## 2022-06-27 NOTE — Telephone Encounter (Signed)
Contacted patient to scheduled appointments. Patient is aware of appointments that are scheduled.   

## 2022-06-27 NOTE — Progress Notes (Signed)
Left lower extremity venous duplex has been completed. Preliminary results can be found in CV Proc through chart review.  Results were given to [facility] PA.  06/27/22 1:27 PM Carlos Levering RVT

## 2022-07-04 NOTE — Progress Notes (Unsigned)
[facility] OFFICE PROGRESS NOTE  Waunita Schooner, MD [place] [address]  DIAGNOSIS: Stage IV (T1c, N1, M1c) non-small cell lung cancer, adenocarcinoma presented with locally advanced disease in the left lung  with a left upper lobe lung nodule in addition to left hilar adenopathy and a solitary left frontal brain metastasis s/p resection of the brain tumor with SRS.  This was diagnosed in July 2022.   Biomarker Findings Tumor Mutational Burden - 43 Muts/Mb Microsatellite status - MS-Stable Genomic Findings For a complete list of the genes assayed, please refer to the Appendix. EGFR L861Q MTAP loss CDKN2A/B CDKN2A loss, CDKN2B loss CUL4A amplification - equivocal? IRS2 amplification SF3B1 K666N TERT promoter -124C>T TP53 N247I 7 Disease relevant genes with no reportable alterations: ALK, BRAF, ERBB2, KRAS, MET, RET, ROS1   PDL1 Expression 95%  PRIOR THERAPY: 1)  Resection followed by [facility] of the solitary frontal lobe brain metastasis on July 28th, 2022  2) SRS to the new metastatic brain lesion performed on 10/20/21 3) SBRT to the liver lesion under the care of Dr. Tammi Klippel, completed on 11/19/21 4) Enlarging LUL nodule under the care of Dr. Tammi Klippel completed on 01/17/22 5) WBR under the care of Dr. Tammi Klippel completed on 03/07/22.   CURRENT THERAPY:  Targeted treatment with Tagrisso 80 mg p.o. daily. First dose 05/27/21.  This was reduced to 40 mg p.o. daily on July 14, 2021. Status post 13 months of treatment  INTERVAL HISTORY: Marie Hensley 76 y.o. female returns to the clinic today for a follow-up visit accompanied by her daughter.  The patient was last seen on 03/31/22.  She was lost to follow-up since that time.  However, she continued to take her treatment with Tagrisso.  The patient recently developed left lower extremity calf pain, erythema, and swelling and was seen in the symptom management clinic. She is found to have a DVT in the  femoral vein to gastrocnemius.  The patient started Eliquis and she is tolerating it well without any concerning abnormal bleeding or bruising.  Since being seen, she saw Dr. Mickeal Skinner and radiation oncology for repeat brain MRI due to her history of metastatic disease to the brain.   Otherwise the patient denies any new concerning complaints today.  She denies any fever, chills, or night sweats. Her weight is stable. Her appetite has improved. She reports baseline cold intolerance. She denies any chest pain, shortness of breath, cough, or hemoptysis. She states her breathing is "good".  She denies any diarrhea. She is on dose reduced Tagrisso due to uncontrollable diarrhea. She denies any rashes or itching.  Denies any headache. She continues to have some expressive aphasia.  She denies nausea/vomiting. She is here today for evaluation and repeat blood work.  MEDICAL HISTORY: Past Medical History:  Diagnosis Date   Arthritis    Brain tumor ([place])    Depression    Dyspnea    WITH EXERTION-SEEING DR [name]   HTN (hypertension)    Hyperlipemia    Seizures (H[facility])     ALLERGIES:  has No Known Allergies.  MEDICATIONS:  Current Outpatient Medications  Medication Sig Dispense Refill   acetaminophen (TYLENOL) 500 MG tablet Take 1,000 mg by mouth every 6 (six) hours as needed for mild pain (or arthritis).     apixaban (ELIQUIS) 5 MG TABS tablet Take 1 tablet (5 mg total) by mouth 2 (two) times daily. 60 tablet 2   APIXABAN (ELIQUIS) VTE STARTER PACK ($RemoveBefor'10MG'reSfdfKVyjcE$  AND  5MG) Take as directed on package: start with two-69m tablets twice daily for 7 days. On day 8, switch to one-528mtablet twice daily. 74 tablet 0   atorvastatin (LIPITOR) 40 MG tablet Take 1 tablet (40 mg total) by mouth at bedtime. 90 tablet 3   cetirizine (ZYRTEC) 10 MG tablet Take 10 mg by mouth daily as needed for allergies or rhinitis.     citalopram (CELEXA) 20 MG tablet TAKE ONE TABLET BY MOUTH AT BEDTIME FOR DEPRESSION 90 tablet 1    diphenoxylate-atropine (LOMOTIL) 2.5-0.025 MG tablet Take 1 tablet by mouth 4 (four) times daily as needed for diarrhea or loose stools. 30 tablet 0   fluticasone (FLONASE) 50 MCG/ACT nasal spray Place 1 spray into both nostrils daily as needed for allergies.     levETIRAcetam (KEPPRA) 500 MG tablet TAKE TWO TABLETS BY MOUTH TWICE A DAY 120 tablet 3   loperamide (IMODIUM A-D) 2 MG tablet Take 2 mg by mouth 2 (two) times daily as needed for diarrhea or loose stools.     osimertinib mesylate (TAGRISSO) 40 MG tablet Take 1 tablet (40 mg total) by mouth daily. 30 tablet 5   oxyCODONE (OXY IR/ROXICODONE) 5 MG immediate release tablet Take 1 tablet (5 mg total) by mouth 3 (three) times daily as needed for severe pain. 20 tablet 0   prochlorperazine (COMPAZINE) 10 MG tablet Take 1 tablet (10 mg total) by mouth every 6 (six) hours as needed for nausea or vomiting. 30 tablet 0   propranolol (INDERAL) 20 MG tablet Take 1 tablet (20 mg total) by mouth daily. 60 tablet 1   No current facility-administered medications for this visit.    SURGICAL HISTORY:  Past Surgical History:  Procedure Laterality Date   APPENDECTOMY  191497 APPLICATION OF CRANIAL NAVIGATION N/A 04/08/2021   Procedure: APPLICATION OF CRANIAL NAVIGATION;  Surgeon: OsJudith PartMD;  [facility]: [facility]surgery;  Laterality: N/A;  RM 21   CRANIOTOMY Left 04/08/2021   Procedure: Left Craniotomy for tumor resection with brainlab;  Surgeon: OsJudith PartMD;  [facility] Service: [facility];  Laterality: Left;   EYE SURGERY Bilateral    CATARACTS   OOPHORECTOMY Right    VIDEO BRONCHOSCOPY WITH ENDOBRONCHIAL NAVIGATION Left 12/23/2020   Procedure: VIDEO BRONCHOSCOPY WITH ENDOBRONCHIAL NAVIGATION;  Surgeon: GoTyler PitaMD;  Location: [facility];  Service: Pulmonary;  Laterality: Left;   VIDEO BRONCHOSCOPY WITH ENDOBRONCHIAL NAVIGATION Left 02/01/2021   Procedure: ROBOTIC ASSISTED VIDEO BRONCHOSCOPY WITH  ENDOBRONCHIAL NAVIGATION;  Surgeon: GoTyler PitaMD;  Location: [facility];  Service: Pulmonary;  Laterality: Left;   VIDEO BRONCHOSCOPY WITH ENDOBRONCHIAL ULTRASOUND Left 12/23/2020   Procedure: VIDEO BRONCHOSCOPY WITH ENDOBRONCHIAL ULTRASOUND;  Surgeon: GoTyler PitaMD;  Location: [facility];  Service: Pulmonary;  Laterality: Left;   VIDEO BRONCHOSCOPY WITH ENDOBRONCHIAL ULTRASOUND Left 02/01/2021   Procedure: ROBOTIC ASSITSTED VIDEO BRONCHOSCOPY WITH ENDOBRONCHIAL ULTRASOUND;  Surgeon: GoTyler PitaMD;  Location: [facility];  Service: Pulmonary;  Laterality: Left;    REVIEW OF SYSTEMS:   Review of Systems  Constitutional: Negative for appetite change, chills, fatigue, fever and unexpected weight change.  HENT: Negative for mouth sores, nosebleeds, sore throat and trouble swallowing.   Eyes: Negative for eye problems and icterus.  Respiratory: Positive for mild dyspnea with certain activities. Negative for cough, hemoptysis, and wheezing. Cardiovascular: Negative for chest pain and leg swelling.  Gastrointestinal: Negative for abdominal pain, constipation, diarrhea, nausea and vomiting.  Genitourinary:  Negative for bladder incontinence, difficulty urinating, dysuria, frequency and hematuria.   Musculoskeletal: Negative for back pain, gait problem, neck pain and neck stiffness.  Skin: Negative for itching and rash.  Neurological: Positive for trouble with word finding. Negative for dizziness, extremity weakness, gait problem, headaches, light-headedness and seizures.  Hematological: Negative for adenopathy. Does not bruise/bleed easily.  Psychiatric/Behavioral: Negative for confusion, depression and sleep disturbance. The patient is not nervous/anxious.     PHYSICAL EXAMINATION:  Blood pressure 122/72, pulse 89, temperature 97.7 F (36.5 C), weight 152 lb 6.4 oz (69.1 kg), SpO2 98 %.  ECOG PERFORMANCE STATUS: 1  Physical Exam  Constitutional: Oriented to person, place, and  time and well-developed, well-nourished, and in no distress.  HENT:  Head: Normocephalic and atraumatic.  Mouth/Throat: Oropharynx is clear and moist. No oropharyngeal exudate.  Eyes: Conjunctivae are normal. Right eye exhibits no discharge. Left eye exhibits no discharge. No scleral icterus.  Neck: Normal range of motion. Neck supple.  Cardiovascular: Normal rate, regular rhythm, normal heart sounds and intact distal pulses.   Pulmonary/Chest: Effort normal and breath sounds normal. No respiratory distress. No wheezes. No rales.  Abdominal: Soft. Bowel sounds are normal. Exhibits no distension and no mass. There is no tenderness.  Musculoskeletal: Normal range of motion. Exhibits no edema.  Lymphadenopathy:    No cervical adenopathy.  Neurological: Alert and oriented to person, place, and time. Exhibits normal muscle tone. Gait normal. Coordination normal. Trouble with word finding. Skin: Skin is warm and dry. No rash noted. Not diaphoretic. No erythema. No pallor.  Psychiatric: Mood, memory and judgment normal.  Vitals reviewed.  LABORATORY DATA: Lab Results  Component Value Date   WBC 10.1 07/06/2022   HGB 11.5 (L) 07/06/2022   HCT 35.4 (L) 07/06/2022   MCV 92.7 07/06/2022   PLT 118 (L) 07/06/2022      Chemistry      Component Value Date/Time   NA 136 06/27/2022 1217   K 3.4 (L) 06/27/2022 1217   CL 105 06/27/2022 1217   CO2 24 06/27/2022 1217   BUN 22 06/27/2022 1217   CREATININE 1.29 (H) 06/27/2022 1217      Component Value Date/Time   CALCIUM 9.2 06/27/2022 1217   ALKPHOS 100 06/27/2022 1217   AST 21 06/27/2022 1217   ALT 19 06/27/2022 1217   BILITOT 0.6 06/27/2022 1217       RADIOGRAPHIC STUDIES:  VAS [name] LOWER EXTREMITY VENOUS (DVT)  Result Date: 06/27/2022  Lower Venous DVT Study Patient Name:  MAYRIN SCHMUCK  Date of Exam:   06/27/2022 Medical Rec #: [id number]           Accession #:    [id number] Date of Birth: 05/22/1946          Patient Gender: F  Patient Age:   83 years Exam Location:  [facility] Procedure:      VAS [name] LOWER EXTREMITY VENOUS (DVT) Referring Phys: Sherol Dade --------------------------------------------------------------------------------  Indications: Swelling, and Pain.  Risk Factors: Cancer. Comparison Study: No prior studies. Performing Technologist: Oliver Hum RVT  Examination Guidelines: A complete evaluation includes B-mode imaging, spectral Doppler, color Doppler, and power Doppler as needed of all accessible portions of each vessel. Bilateral testing is considered an integral part of a complete examination. Limited examinations for reoccurring indications may be performed as noted. The reflux portion of the exam is performed with the patient in reverse Trendelenburg.  +-----+---------------+---------+-----------+----------+--------------+ RIGHTCompressibilityPhasicitySpontaneityPropertiesThrombus Aging +-----+---------------+---------+-----------+----------+--------------+ CFV  Full  Yes      Yes                                 +-----+---------------+---------+-----------+----------+--------------+   +---------+---------------+---------+-----------+----------+--------------+ LEFT     CompressibilityPhasicitySpontaneityPropertiesThrombus Aging +---------+---------------+---------+-----------+----------+--------------+ CFV      Full           Yes      Yes                                 +---------+---------------+---------+-----------+----------+--------------+ SFJ      Full                                                        +---------+---------------+---------+-----------+----------+--------------+ FV Prox  None           No       No                   Acute          +---------+---------------+---------+-----------+----------+--------------+ FV Mid   None           No       No                   Acute           +---------+---------------+---------+-----------+----------+--------------+ FV DistalNone           No       No                   Acute          +---------+---------------+---------+-----------+----------+--------------+ PFV      Full                                                        +---------+---------------+---------+-----------+----------+--------------+ POP      None           No       No                   Acute          +---------+---------------+---------+-----------+----------+--------------+ PTV      None                                         Acute          +---------+---------------+---------+-----------+----------+--------------+ PERO     None                                         Acute          +---------+---------------+---------+-----------+----------+--------------+ Gastroc  None                                         Acute          +---------+---------------+---------+-----------+----------+--------------+  Summary: RIGHT: - No evidence of common femoral vein obstruction.  LEFT: - Findings consistent with acute deep vein thrombosis involving the left femoral vein, left popliteal vein, left posterior tibial veins, left peroneal veins, and left gastrocnemius veins. - No cystic structure found in the popliteal fossa.  *See table(s) above for measurements and observations. Electronically signed by Orlie Pollen on 06/27/2022 at 5:25:32 PM.    Final    MR Brain W Wo Contrast  Result Date: 06/10/2022 CLINICAL DATA:  Brain/CNS neoplasm, assess treatment response EXAM: MRI HEAD WITHOUT AND WITH CONTRAST TECHNIQUE: Multiplanar, multiecho pulse sequences of the brain and surrounding structures were obtained without and with intravenous contrast. CONTRAST:  7.5 mL VueWay IV COMPARISON:  MRI head Feb 02, 2022. FINDINGS: Brain: Interval increase in size of the enhancing metastasis within the right frontal lobe (now 14 mm on series 1100, image 233) and a  lesion in the right postcentral gyrus (now 6 mm on series 1100, image 217). Possible punctate new lesion in the right frontal lobe (series 1100, image 236). Other visible lesions appears similar interval marked on series 1100. Artifact limits assessment in the lateral aspect the right cerebrum, largely obscured brain on postcontrast imaging. T2/FLAIR surrounding many these lesions, including increased edema in the right frontal lobe. No evidence of acute infarct, acute hemorrhage, significant mass effect, or hydrocephalus. Vascular: Major arterial flow voids are maintained at the skull base. Skull and upper cervical spine: Left frontal craniotomy. Otherwise, unremarkable marrow. Sinuses/Orbits: Clear sinuses.  No acute orbital findings. Other: No sizable mastoid effusions. IMPRESSION: 1. Possible new punctate lesion in the right frontal lobe. 2. Interval increase in size in right frontal and right parietal lesions. Increased associated surrounding edema in the right frontal lobe. 3. Other visible lesions appear similar. Artifact limits assessment in the lateral aspect the right cerebrum, largely obscured brain on postcontrast imaging. Electronically Signed   By: Margaretha Sheffield M.D.   On: 06/10/2022 17:53     ASSESSMENT/PLAN:  This is a very pleasant 76 years old white female diagnosed with stage IV (T1c, N1, M1c) non-small cell lung cancer, adenocarcinoma presented with locally advanced disease in the left lung with a left upper lobe lung nodule in addition to left hilar adenopathy and solitary left frontal brain metastasis s/p resection of the brain tumor with SRS in July 2022.  This was diagnosed in July 2022. Her molecular studies show she is positive for EGFR mutation L861Q.   The patient is currently undergoing targeted treatment with Tagrisso 80 mg p.o. daily.  She started this on 05/27/21.  Due to uncontrollable diarrhea, the patient's treatment was on hold starting on 07/09/21. She was supposed to  resume this on 07/16/2021 at reduced dose 40 mg p.o daily. She has been tolerating reduced dose well without any adverse side effects.    She underwent SRS for another area of metastatic disease to the brain in February 2023.  This was completed again on 10/20/2021.  She then underwent SBRT to the enlarging liver lesion under the care of Dr. Tammi Klippel.  This was completed on 11/19/2021. Completed SBRT to enlarging left upper lobe nodule which was completed on 01/17/2022.  This was also performed under the care of Dr. Tammi Klippel.  Patient also completed whole brain radiation under the care of Dr. Tammi Klippel in June 2023.  The patient was last seen by Dr. Julien Nordmann on 03/31/2022.  The patient was seen with Dr. Julien Nordmann today.  Labs were reviewed.  Recommend that she continue on  the same treatment at the same dose for now.  We will require a restaging CT scan of the chest, abdomen, and pelvis to restage her disease.   We will aim for this to be performed in the next 10 days or so.   We will see her back in about 10-14 days to review the results.   I went ahead and sent her needs Eliquis dose to her pharmacy.  The patient and her daughter know not to pick this up or to take this medicine until she completes her starter pack.  If this medication is too expensive at her pharmacy, she will call us to resend this to the [facility].  The patient's lower extremity swelling, pain, erythema has greatly improved at this time.  The patient is not having any signs or symptoms of PE at this time.  We did discuss in the setting of malignancy that her anticoagulation will likely be a lifelong recommendation.  She will continue to follow closely with Dr. Mickeal Skinner.  He saw him earlier this month.   She has had some visual defects since having brain radiation. She will continue to follow with her eye doctor.   The patient was advised to call immediately if she has any concerning symptoms in the interval. The patient voices  understanding of current disease status and treatment options and is in agreement with the current care plan. All questions were answered. The patient knows to call the clinic with any problems, questions or concerns. We can certainly see the patient much sooner if necessary     Orders Placed This Encounter  Procedures   CT Chest W Contrast    Standing Status:   Future    Standing Expiration Date:   07/06/2023    Order Specific Question:   If indicated for the ordered procedure, I authorize the administration of contrast media per Radiology protocol    Answer:   Yes    Order Specific Question:   Preferred imaging location?    Answer:   [facility]   CT Abdomen Pelvis W Contrast    Standing Status:   Future    Standing Expiration Date:   07/06/2023    Order Specific Question:   If indicated for the ordered procedure, I authorize the administration of contrast media per Radiology protocol    Answer:   Yes    Order Specific Question:   Preferred imaging location?    Answer:   [facility]    Order Specific Question:   Is Oral Contrast requested for this exam?    Answer:   Yes, Per Radiology protocol   CBC with Differential ([place] Only)    Standing Status:   Future    Standing Expiration Date:   07/07/2023   CMP ([place] only)    Standing Status:   Future    Standing Expiration Date:   07/07/2023      Tobe Sos Briteny Fulghum, PA-C 07/06/22  ADDENDUM: Hematology/Oncology Attending: I had a face-to-face encounter with the patient today.  I reviewed her records, lab and recommended her care plan.  This is a very pleasant 76 years old white female with history of stage IV non-small cell lung cancer, adenocarcinoma diagnosed in July 2022 with positive EGFR mutation ([id number]).  The patient s/p resection followed by [facility] to solitary brain metastasis followed by [facility] to new brain metastasis as well as SBRT to liver lesion in March 2023.  She also underwent whole  brain irradiation under the care of Dr. Tammi Klippel in June 2023.  She has been on treatment with targeted therapy with Tagrisso 80 mg p.o. daily initially started on May 27, 2021 but her dose was reduced to 40 mg p.o. daily on November 2 secondary to intolerance with significant diarrhea.  She is status post 13 months of treatment and has been tolerating this treatment fairly well.  She was lost to follow-up for few months since July 2023. The patient was found recently to have deep venous thrombosis in the femoral vein to gastrocnemius and currently on treatment with Eliquis and tolerating it fairly well. I recommended for the patient to continue her current treatment with Tagrisso with the same dose. We will arrange for the patient to have repeat CT scan of the chest, abdomen and pelvis in 1 week and she will come back for follow-up visit after the scan. She was advised to call immediately if she has any other concerning symptoms in the interval. The total time spent in the appointment was 30 minutes. Disclaimer: This note was dictated with voice recognition software. Similar sounding words can inadvertently be transcribed and may be missed upon review. Eilleen Kempf, MD

## 2022-07-06 ENCOUNTER — Other Ambulatory Visit: Payer: Self-pay

## 2022-07-06 ENCOUNTER — Inpatient Hospital Stay (HOSPITAL_BASED_OUTPATIENT_CLINIC_OR_DEPARTMENT_OTHER): Payer: Medicare HMO | Admitting: Physician Assistant

## 2022-07-06 ENCOUNTER — Inpatient Hospital Stay: Payer: Medicare HMO

## 2022-07-06 VITALS — BP 122/72 | HR 89 | Temp 97.7°F | Wt 152.4 lb

## 2022-07-06 DIAGNOSIS — C7931 Secondary malignant neoplasm of brain: Secondary | ICD-10-CM | POA: Diagnosis not present

## 2022-07-06 DIAGNOSIS — C787 Secondary malignant neoplasm of liver and intrahepatic bile duct: Secondary | ICD-10-CM

## 2022-07-06 DIAGNOSIS — C3490 Malignant neoplasm of unspecified part of unspecified bronchus or lung: Secondary | ICD-10-CM | POA: Diagnosis not present

## 2022-07-06 DIAGNOSIS — Z7952 Long term (current) use of systemic steroids: Secondary | ICD-10-CM | POA: Diagnosis not present

## 2022-07-06 DIAGNOSIS — C3412 Malignant neoplasm of upper lobe, left bronchus or lung: Secondary | ICD-10-CM

## 2022-07-06 DIAGNOSIS — I10 Essential (primary) hypertension: Secondary | ICD-10-CM | POA: Diagnosis not present

## 2022-07-06 DIAGNOSIS — Z87891 Personal history of nicotine dependence: Secondary | ICD-10-CM | POA: Diagnosis not present

## 2022-07-06 DIAGNOSIS — M79605 Pain in left leg: Secondary | ICD-10-CM | POA: Diagnosis not present

## 2022-07-06 DIAGNOSIS — Z79899 Other long term (current) drug therapy: Secondary | ICD-10-CM | POA: Diagnosis not present

## 2022-07-06 LAB — CBC WITH DIFFERENTIAL (CANCER CENTER ONLY)
Abs Immature Granulocytes: 0.4 10*3/uL — ABNORMAL HIGH (ref 0.00–0.07)
Basophils Absolute: 0 10*3/uL (ref 0.0–0.1)
Basophils Relative: 0 %
Eosinophils Absolute: 0.3 10*3/uL (ref 0.0–0.5)
Eosinophils Relative: 3 %
HCT: 35.4 % — ABNORMAL LOW (ref 36.0–46.0)
Hemoglobin: 11.5 g/dL — ABNORMAL LOW (ref 12.0–15.0)
Immature Granulocytes: 4 %
Lymphocytes Relative: 12 %
Lymphs Abs: 1.3 10*3/uL (ref 0.7–4.0)
MCH: 30.1 pg (ref 26.0–34.0)
MCHC: 32.5 g/dL (ref 30.0–36.0)
MCV: 92.7 fL (ref 80.0–100.0)
Monocytes Absolute: 1 10*3/uL (ref 0.1–1.0)
Monocytes Relative: 10 %
Neutro Abs: 7.2 10*3/uL (ref 1.7–7.7)
Neutrophils Relative %: 71 %
Platelet Count: 118 10*3/uL — ABNORMAL LOW (ref 150–400)
RBC: 3.82 MIL/uL — ABNORMAL LOW (ref 3.87–5.11)
RDW: 14.4 % (ref 11.5–15.5)
WBC Count: 10.1 10*3/uL (ref 4.0–10.5)
nRBC: 0 % (ref 0.0–0.2)

## 2022-07-06 LAB — CMP (CANCER CENTER ONLY)
ALT: 16 U/L (ref 0–44)
AST: 17 U/L (ref 15–41)
Albumin: 3.7 g/dL (ref 3.5–5.0)
Alkaline Phosphatase: 91 U/L (ref 38–126)
Anion gap: 10 (ref 5–15)
BUN: 25 mg/dL — ABNORMAL HIGH (ref 8–23)
CO2: 24 mmol/L (ref 22–32)
Calcium: 9.3 mg/dL (ref 8.9–10.3)
Chloride: 105 mmol/L (ref 98–111)
Creatinine: 1.41 mg/dL — ABNORMAL HIGH (ref 0.44–1.00)
GFR, Estimated: 38 mL/min — ABNORMAL LOW (ref >60)
Glucose, Bld: 110 mg/dL — ABNORMAL HIGH (ref 70–99)
Potassium: 4.2 mmol/L (ref 3.5–5.1)
Sodium: 139 mmol/L (ref 135–145)
Total Bilirubin: 1 mg/dL (ref 0.3–1.2)
Total Protein: 6.8 g/dL (ref 6.5–8.1)

## 2022-07-06 MED ORDER — APIXABAN 5 MG PO TABS: 5.0000 mg | ORAL_TABLET | Freq: Two times a day (BID) | ORAL | 2 refills | Status: AC

## 2022-07-12 ENCOUNTER — Encounter: Payer: Self-pay | Admitting: Radiation Therapy

## 2022-07-13 DEATH — deceased

## 2022-07-19 ENCOUNTER — Ambulatory Visit: Payer: Medicare HMO | Admitting: Internal Medicine

## 2022-07-19 ENCOUNTER — Other Ambulatory Visit: Payer: Medicare HMO

## 2022-08-12 LAB — HISTOPLASMA ANTIGEN, URINE: Histoplasma Antigen, urine: <0.5 (ref <0.5)

## 2022-09-16 ENCOUNTER — Other Ambulatory Visit: Payer: Medicare HMO

## 2022-09-19 ENCOUNTER — Ambulatory Visit: Payer: Medicare HMO | Admitting: Internal Medicine
